# Patient Record
Sex: Female | Born: 1955 | Race: Black or African American | Hispanic: No | Marital: Married | Smoking: Former smoker
Health system: Southern US, Community
[De-identification: ages and names within clinical notes are randomized; demographics above are authoritative.]

## PROBLEM LIST (undated history)

## (undated) DIAGNOSIS — D649 Anemia, unspecified: Secondary | ICD-10-CM

## (undated) DIAGNOSIS — K219 Gastro-esophageal reflux disease without esophagitis: Secondary | ICD-10-CM

## (undated) DIAGNOSIS — Z9889 Other specified postprocedural states: Secondary | ICD-10-CM

## (undated) DIAGNOSIS — Z96659 Presence of unspecified artificial knee joint: Secondary | ICD-10-CM

## (undated) DIAGNOSIS — I10 Essential (primary) hypertension: Secondary | ICD-10-CM

## (undated) DIAGNOSIS — J189 Pneumonia, unspecified organism: Secondary | ICD-10-CM

## (undated) DIAGNOSIS — M549 Dorsalgia, unspecified: Secondary | ICD-10-CM

## (undated) DIAGNOSIS — N289 Disorder of kidney and ureter, unspecified: Secondary | ICD-10-CM

## (undated) DIAGNOSIS — H409 Unspecified glaucoma: Secondary | ICD-10-CM

## (undated) DIAGNOSIS — T7840XA Allergy, unspecified, initial encounter: Secondary | ICD-10-CM

## (undated) DIAGNOSIS — I639 Cerebral infarction, unspecified: Secondary | ICD-10-CM

## (undated) DIAGNOSIS — E785 Hyperlipidemia, unspecified: Secondary | ICD-10-CM

## (undated) DIAGNOSIS — J40 Bronchitis, not specified as acute or chronic: Secondary | ICD-10-CM

## (undated) DIAGNOSIS — IMO0002 Reserved for concepts with insufficient information to code with codable children: Secondary | ICD-10-CM

## (undated) DIAGNOSIS — F329 Major depressive disorder, single episode, unspecified: Secondary | ICD-10-CM

## (undated) DIAGNOSIS — F1410 Cocaine abuse, uncomplicated: Secondary | ICD-10-CM

## (undated) DIAGNOSIS — F32A Depression, unspecified: Secondary | ICD-10-CM

## (undated) DIAGNOSIS — M1990 Unspecified osteoarthritis, unspecified site: Secondary | ICD-10-CM

## (undated) HISTORY — DX: Reserved for concepts with insufficient information to code with codable children: IMO0002

## (undated) HISTORY — PX: ABDOMINAL HYSTERECTOMY: SHX81

## (undated) HISTORY — PX: COLONOSCOPY: SHX174

## (undated) HISTORY — DX: Cerebral infarction, unspecified: I63.9

## (undated) HISTORY — DX: Hyperlipidemia, unspecified: E78.5

## (undated) HISTORY — PX: CHOLECYSTECTOMY: SHX55

## (undated) HISTORY — PX: SPINE SURGERY: SHX786

## (undated) HISTORY — DX: Essential (primary) hypertension: I10

## (undated) HISTORY — DX: Unspecified glaucoma: H40.9

## (undated) HISTORY — DX: Allergy, unspecified, initial encounter: T78.40XA

## (undated) HISTORY — PX: SHOULDER SURGERY: SHX246

## (undated) HISTORY — PX: LUMBAR FUSION: SHX111

## (undated) HISTORY — PX: APPENDECTOMY: SHX54

## (undated) HISTORY — PX: KNEE ARTHROSCOPY: SHX127

## (undated) HISTORY — DX: Dorsalgia, unspecified: M54.9

## (undated) HISTORY — DX: Cocaine abuse, uncomplicated: F14.10

---

## 2002-08-05 HISTORY — PX: OTHER SURGICAL HISTORY: SHX169

## 2002-08-05 HISTORY — PX: JOINT REPLACEMENT: SHX530

## 2004-08-05 HISTORY — PX: BACK SURGERY: SHX140

## 2005-08-05 DIAGNOSIS — Z96659 Presence of unspecified artificial knee joint: Secondary | ICD-10-CM

## 2005-08-05 HISTORY — DX: Presence of unspecified artificial knee joint: Z96.659

## 2011-03-15 ENCOUNTER — Ambulatory Visit: Payer: Self-pay | Admitting: Family Medicine

## 2011-04-23 ENCOUNTER — Ambulatory Visit: Payer: Self-pay | Admitting: Family Medicine

## 2011-05-01 ENCOUNTER — Ambulatory Visit (INDEPENDENT_AMBULATORY_CARE_PROVIDER_SITE_OTHER): Payer: Medicare Other | Admitting: Family Medicine

## 2011-05-01 ENCOUNTER — Encounter: Payer: Self-pay | Admitting: Family Medicine

## 2011-05-01 VITALS — BP 172/100 | HR 83 | Resp 16 | Ht 67.0 in | Wt 226.0 lb

## 2011-05-01 DIAGNOSIS — E785 Hyperlipidemia, unspecified: Secondary | ICD-10-CM

## 2011-05-01 DIAGNOSIS — Z8673 Personal history of transient ischemic attack (TIA), and cerebral infarction without residual deficits: Secondary | ICD-10-CM

## 2011-05-01 DIAGNOSIS — Z720 Tobacco use: Secondary | ICD-10-CM

## 2011-05-01 DIAGNOSIS — F17200 Nicotine dependence, unspecified, uncomplicated: Secondary | ICD-10-CM

## 2011-05-01 DIAGNOSIS — G8929 Other chronic pain: Secondary | ICD-10-CM

## 2011-05-01 DIAGNOSIS — I10 Essential (primary) hypertension: Secondary | ICD-10-CM | POA: Insufficient documentation

## 2011-05-01 DIAGNOSIS — M549 Dorsalgia, unspecified: Secondary | ICD-10-CM

## 2011-05-01 MED ORDER — HYDROCHLOROTHIAZIDE 25 MG PO TABS: 25.0000 mg | ORAL_TABLET | Freq: Every day | ORAL | Status: DC
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
## 2011-05-01 NOTE — Patient Instructions (Signed)
Continue your Naprosyn For your blood pressure -start the HCTZ daily Please get your blood work- at least 48 hours before your next visit F/U in 3 weeks for blood pressure  Continue to work on your smoking.

## 2011-05-02 ENCOUNTER — Encounter: Payer: Self-pay | Admitting: Family Medicine

## 2011-05-02 DIAGNOSIS — Z8673 Personal history of transient ischemic attack (TIA), and cerebral infarction without residual deficits: Secondary | ICD-10-CM | POA: Insufficient documentation

## 2011-05-02 DIAGNOSIS — Z720 Tobacco use: Secondary | ICD-10-CM | POA: Insufficient documentation

## 2011-05-02 DIAGNOSIS — M549 Dorsalgia, unspecified: Secondary | ICD-10-CM | POA: Insufficient documentation

## 2011-05-02 DIAGNOSIS — G8929 Other chronic pain: Secondary | ICD-10-CM | POA: Insufficient documentation

## 2011-05-02 NOTE — Assessment & Plan Note (Signed)
Check FLP 

## 2011-05-02 NOTE — Assessment & Plan Note (Signed)
Obtain medical records. I am not sure she is not on a blood thinner such as plavix or aggrenox. She is also not on risk factor lower meds such as statin

## 2011-05-02 NOTE — Assessment & Plan Note (Signed)
Start HCTZ today, may need more than 1 med, obtain labs, recheck BP 3 weeks

## 2011-05-02 NOTE — Assessment & Plan Note (Signed)
In process of quitting, will continue to encourage

## 2011-05-02 NOTE — Assessment & Plan Note (Signed)
Maintained on NSAIDS at this time

## 2011-05-22 ENCOUNTER — Encounter: Payer: Medicare Other | Admitting: Family Medicine

## 2011-06-08 ENCOUNTER — Encounter (HOSPITAL_COMMUNITY): Payer: Self-pay

## 2011-06-08 ENCOUNTER — Emergency Department (HOSPITAL_COMMUNITY): Payer: Medicare Other

## 2011-06-08 ENCOUNTER — Emergency Department (HOSPITAL_COMMUNITY)
Admission: EM | Admit: 2011-06-08 | Discharge: 2011-06-08 | Disposition: A | Discharge status: Home / Self Care | Payer: Medicare Other | Attending: Emergency Medicine | Admitting: Emergency Medicine

## 2011-06-08 DIAGNOSIS — F17200 Nicotine dependence, unspecified, uncomplicated: Secondary | ICD-10-CM | POA: Insufficient documentation

## 2011-06-08 DIAGNOSIS — Z8673 Personal history of transient ischemic attack (TIA), and cerebral infarction without residual deficits: Secondary | ICD-10-CM | POA: Insufficient documentation

## 2011-06-08 DIAGNOSIS — M545 Low back pain, unspecified: Secondary | ICD-10-CM | POA: Insufficient documentation

## 2011-06-08 DIAGNOSIS — I10 Essential (primary) hypertension: Secondary | ICD-10-CM | POA: Insufficient documentation

## 2011-06-08 DIAGNOSIS — Y92410 Unspecified street and highway as the place of occurrence of the external cause: Secondary | ICD-10-CM | POA: Insufficient documentation

## 2011-06-08 DIAGNOSIS — S335XXA Sprain of ligaments of lumbar spine, initial encounter: Secondary | ICD-10-CM | POA: Insufficient documentation

## 2011-06-08 DIAGNOSIS — S39012A Strain of muscle, fascia and tendon of lower back, initial encounter: Secondary | ICD-10-CM

## 2011-06-08 MED ORDER — CYCLOBENZAPRINE HCL 10 MG PO TABS: ORAL_TABLET | ORAL | Status: DC

## 2011-06-08 MED ORDER — IBUPROFEN 800 MG PO TABS
800.0000 mg | ORAL_TABLET | Freq: Once | ORAL | Status: AC
  Administered 2011-06-08: 800 mg via ORAL
  Filled 2011-06-08: qty 1

## 2011-06-08 MED ORDER — CYCLOBENZAPRINE HCL 10 MG PO TABS
10.0000 mg | ORAL_TABLET | Freq: Once | ORAL | Status: AC
  Administered 2011-06-08: 10 mg via ORAL
  Filled 2011-06-08: qty 1
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
## 2011-06-08 NOTE — ED Provider Notes (Signed)
Medical screening examination/treatment/procedure(s) were performed by non-physician practitioner and as supervising physician I was immediately available for consultation/collaboration.   Dayton Bailiff, MD 06/08/11 2217
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
## 2011-06-18 ENCOUNTER — Encounter: Payer: Self-pay | Admitting: Family Medicine

## 2011-06-19 ENCOUNTER — Encounter: Payer: Self-pay | Admitting: Family Medicine

## 2011-06-20 ENCOUNTER — Ambulatory Visit (INDEPENDENT_AMBULATORY_CARE_PROVIDER_SITE_OTHER): Payer: Medicare Other | Admitting: Family Medicine

## 2011-06-20 ENCOUNTER — Encounter: Payer: Self-pay | Admitting: Family Medicine

## 2011-06-20 DIAGNOSIS — M549 Dorsalgia, unspecified: Secondary | ICD-10-CM

## 2011-06-20 DIAGNOSIS — F17200 Nicotine dependence, unspecified, uncomplicated: Secondary | ICD-10-CM

## 2011-06-20 DIAGNOSIS — Z720 Tobacco use: Secondary | ICD-10-CM

## 2011-06-20 DIAGNOSIS — I10 Essential (primary) hypertension: Secondary | ICD-10-CM

## 2011-06-20 DIAGNOSIS — Z23 Encounter for immunization: Secondary | ICD-10-CM

## 2011-06-20 DIAGNOSIS — G8929 Other chronic pain: Secondary | ICD-10-CM

## 2011-06-20 DIAGNOSIS — E785 Hyperlipidemia, unspecified: Secondary | ICD-10-CM

## 2011-06-20 MED ORDER — IBUPROFEN 800 MG PO TABS: 800.0000 mg | ORAL_TABLET | Freq: Three times a day (TID) | ORAL | Status: AC | PRN

## 2011-06-20 MED ORDER — TRIAMTERENE-HCTZ 37.5-25 MG PO TABS: 1.0000 | ORAL_TABLET | Freq: Every day | ORAL | Status: DC

## 2011-06-20 MED ORDER — CYCLOBENZAPRINE HCL 10 MG PO TABS: 10.0000 mg | ORAL_TABLET | Freq: Three times a day (TID) | ORAL | Status: DC | PRN
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
## 2011-06-20 NOTE — Assessment & Plan Note (Signed)
Uncontrolled, change to maxzide

## 2011-06-20 NOTE — Assessment & Plan Note (Signed)
Hyperlipidemia:Low fat diet discussed and encouraged.  Updated labs today 
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
## 2011-06-20 NOTE — Assessment & Plan Note (Signed)
counselled to quit , quit date is set for 12/25, current is 1 pack lasts 2 weeks

## 2011-06-20 NOTE — Assessment & Plan Note (Addendum)
Uncontrolled, and increased back pain, toradol in office today, anti inflammatories and muscle relaxant

## 2011-06-21 DIAGNOSIS — Z23 Encounter for immunization: Secondary | ICD-10-CM

## 2011-06-21 LAB — LIPID PANEL
HDL: 42 mg/dL (ref >39)
LDL Cholesterol: 226 mg/dL — ABNORMAL HIGH (ref 0–99)

## 2011-06-21 LAB — COMPREHENSIVE METABOLIC PANEL
ALT: 14 U/L (ref 0–35)
AST: 23 U/L (ref 0–37)
Alkaline Phosphatase: 91 U/L (ref 39–117)
Creat: 1.2 mg/dL — ABNORMAL HIGH (ref 0.50–1.10)
Sodium: 140 mEq/L (ref 135–145)
Total Bilirubin: 0.5 mg/dL (ref 0.3–1.2)
Total Protein: 7.1 g/dL (ref 6.0–8.3)

## 2011-06-21 LAB — CBC
HCT: 38.8 % (ref 36.0–46.0)
MCH: 27 pg (ref 26.0–34.0)
MCV: 83.3 fL (ref 78.0–100.0)
RDW: 14 % (ref 11.5–15.5)
WBC: 6.3 10*3/uL (ref 4.0–10.5)

## 2011-06-21 MED ORDER — KETOROLAC TROMETHAMINE 60 MG/2ML IM SOLN
60.0000 mg | Freq: Once | INTRAMUSCULAR | Status: AC
  Administered 2011-06-21: 60 mg via INTRAMUSCULAR

## 2011-06-21 NOTE — Progress Notes (Signed)
Addended by: Abner Greenspan on: 06/21/2011 01:32 PM   Modules accepted: Orders

## 2011-06-24 ENCOUNTER — Other Ambulatory Visit: Payer: Self-pay | Admitting: Family Medicine

## 2011-06-26 ENCOUNTER — Other Ambulatory Visit: Payer: Self-pay

## 2011-06-26 MED ORDER — PRAVASTATIN SODIUM 40 MG PO TABS: 40.0000 mg | ORAL_TABLET | Freq: Every evening | ORAL | Status: DC

## 2011-07-23 ENCOUNTER — Ambulatory Visit (HOSPITAL_COMMUNITY)
Admission: RE | Admit: 2011-07-23 | Discharge: 2011-07-23 | Disposition: A | Discharge status: Home / Self Care | Payer: Medicare Other | Source: Ambulatory Visit | Attending: Sports Medicine | Admitting: Sports Medicine

## 2011-07-23 ENCOUNTER — Encounter (HOSPITAL_COMMUNITY): Payer: Self-pay

## 2011-07-23 DIAGNOSIS — M6281 Muscle weakness (generalized): Secondary | ICD-10-CM | POA: Insufficient documentation

## 2011-07-23 DIAGNOSIS — IMO0001 Reserved for inherently not codable concepts without codable children: Secondary | ICD-10-CM | POA: Insufficient documentation

## 2011-07-23 DIAGNOSIS — I10 Essential (primary) hypertension: Secondary | ICD-10-CM | POA: Insufficient documentation

## 2011-07-23 DIAGNOSIS — M545 Low back pain, unspecified: Secondary | ICD-10-CM | POA: Insufficient documentation

## 2011-07-23 HISTORY — DX: Other specified postprocedural states: Z98.890

## 2011-07-23 HISTORY — DX: Presence of unspecified artificial knee joint: Z96.659
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
## 2011-07-25 ENCOUNTER — Ambulatory Visit: Payer: Medicare Other | Admitting: Family Medicine

## 2011-07-25 ENCOUNTER — Telehealth: Payer: Self-pay | Admitting: Family Medicine

## 2011-07-25 MED ORDER — CYCLOBENZAPRINE HCL 10 MG PO TABS: 10.0000 mg | ORAL_TABLET | Freq: Three times a day (TID) | ORAL | Status: DC | PRN

## 2011-07-25 NOTE — Telephone Encounter (Signed)
Sent in

## 2011-07-26 ENCOUNTER — Ambulatory Visit (HOSPITAL_COMMUNITY)
Admission: RE | Admit: 2011-07-26 | Discharge: 2011-07-26 | Disposition: A | Discharge status: Home / Self Care | Payer: Medicare Other | Source: Ambulatory Visit
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
## 2011-08-01 ENCOUNTER — Ambulatory Visit (HOSPITAL_COMMUNITY): Payer: Medicare Other

## 2011-08-01 ENCOUNTER — Telehealth (HOSPITAL_COMMUNITY): Payer: Self-pay

## 2011-08-08 ENCOUNTER — Ambulatory Visit (HOSPITAL_COMMUNITY): Payer: Medicare Other

## 2011-08-13 ENCOUNTER — Encounter: Payer: Self-pay | Admitting: Family Medicine

## 2011-08-13 ENCOUNTER — Ambulatory Visit: Payer: Medicare Other | Admitting: Family Medicine

## 2011-08-13 ENCOUNTER — Inpatient Hospital Stay (HOSPITAL_COMMUNITY): Admission: RE | Admit: 2011-08-13 | Payer: Medicare Other | Source: Ambulatory Visit

## 2011-08-15 ENCOUNTER — Ambulatory Visit (HOSPITAL_COMMUNITY): Payer: Medicare Other

## 2011-08-27 ENCOUNTER — Ambulatory Visit (INDEPENDENT_AMBULATORY_CARE_PROVIDER_SITE_OTHER): Payer: Medicare Other | Admitting: Family Medicine

## 2011-08-27 ENCOUNTER — Encounter: Payer: Self-pay | Admitting: Family Medicine

## 2011-08-27 VITALS — BP 160/98 | HR 87 | Temp 99.0°F | Resp 16 | Ht 67.0 in | Wt 235.0 lb

## 2011-08-27 DIAGNOSIS — I10 Essential (primary) hypertension: Secondary | ICD-10-CM

## 2011-08-27 DIAGNOSIS — G8929 Other chronic pain: Secondary | ICD-10-CM

## 2011-08-27 DIAGNOSIS — E785 Hyperlipidemia, unspecified: Secondary | ICD-10-CM

## 2011-08-27 DIAGNOSIS — Z720 Tobacco use: Secondary | ICD-10-CM

## 2011-08-27 DIAGNOSIS — J209 Acute bronchitis, unspecified: Secondary | ICD-10-CM

## 2011-08-27 DIAGNOSIS — F17200 Nicotine dependence, unspecified, uncomplicated: Secondary | ICD-10-CM

## 2011-08-27 DIAGNOSIS — M549 Dorsalgia, unspecified: Secondary | ICD-10-CM

## 2011-08-27 DIAGNOSIS — J40 Bronchitis, not specified as acute or chronic: Secondary | ICD-10-CM

## 2011-08-27 MED ORDER — PREDNISONE 20 MG PO TABS: ORAL_TABLET | ORAL | Status: DC

## 2011-08-27 MED ORDER — HYDROCODONE-ACETAMINOPHEN 5-500 MG PO TABS: 1.0000 | ORAL_TABLET | Freq: Two times a day (BID) | ORAL | Status: DC | PRN

## 2011-08-27 MED ORDER — CEFTRIAXONE SODIUM 1 G IJ SOLR
1.0000 g | Freq: Once | INTRAMUSCULAR | Status: AC
  Administered 2011-08-27: 1 g via INTRAMUSCULAR

## 2011-08-27 MED ORDER — METHYLPREDNISOLONE SODIUM SUCC 125 MG IJ SOLR
60.0000 mg | Freq: Once | INTRAMUSCULAR | Status: AC
  Administered 2011-08-27: 60 mg via INTRAMUSCULAR

## 2011-08-27 MED ORDER — DOXYCYCLINE HYCLATE 100 MG PO TABS: 100.0000 mg | ORAL_TABLET | Freq: Two times a day (BID) | ORAL | Status: AC

## 2011-08-27 MED ORDER — AMLODIPINE BESYLATE 10 MG PO TABS: 10.0000 mg | ORAL_TABLET | Freq: Every day | ORAL | Status: DC

## 2011-08-27 MED ORDER — ATORVASTATIN CALCIUM 40 MG PO TABS: 40.0000 mg | ORAL_TABLET | Freq: Every day | ORAL | Status: DC
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
## 2011-08-27 NOTE — Patient Instructions (Signed)
Year being treated for bronchitis and sinusitis. Take the antibiotics as prescribed Take the steroids for both her lungs and her back pain Take Mucinex for congestion A week after he you are feeling better start the amlodipine in addition to the Maxzide for your blood pressure. Start the Lipitor for her cholesterol I encourage you to quit smoking Followup in 2 months

## 2011-08-28 ENCOUNTER — Encounter: Payer: Self-pay | Admitting: Family Medicine

## 2011-08-28 DIAGNOSIS — J209 Acute bronchitis, unspecified: Secondary | ICD-10-CM | POA: Insufficient documentation

## 2011-08-28 NOTE — Assessment & Plan Note (Addendum)
F/u with surgeon, history of chronic back problems,

## 2011-08-28 NOTE — Assessment & Plan Note (Addendum)
Blood pressure, not at goal, add norvasc to regimen,

## 2011-08-28 NOTE — Assessment & Plan Note (Signed)
Counseled on cessation 
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
## 2011-08-28 NOTE — Assessment & Plan Note (Signed)
Very high LDL, start Lipitor, pt never received Pravastatin

## 2011-08-28 NOTE — Assessment & Plan Note (Signed)
Antibiotics, steroid burst

## 2011-10-01 ENCOUNTER — Ambulatory Visit (HOSPITAL_COMMUNITY)
Admission: RE | Admit: 2011-10-01 | Discharge: 2011-10-01 | Disposition: A | Discharge status: Home / Self Care | Payer: Medicare Other | Source: Ambulatory Visit | Attending: Sports Medicine | Admitting: Sports Medicine

## 2011-10-01 DIAGNOSIS — M545 Low back pain, unspecified: Secondary | ICD-10-CM | POA: Insufficient documentation

## 2011-10-01 DIAGNOSIS — I10 Essential (primary) hypertension: Secondary | ICD-10-CM | POA: Insufficient documentation

## 2011-10-01 DIAGNOSIS — IMO0001 Reserved for inherently not codable concepts without codable children: Secondary | ICD-10-CM | POA: Insufficient documentation

## 2011-10-01 DIAGNOSIS — M6281 Muscle weakness (generalized): Secondary | ICD-10-CM | POA: Insufficient documentation
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
## 2011-10-03 ENCOUNTER — Ambulatory Visit (HOSPITAL_COMMUNITY)
Admission: RE | Admit: 2011-10-03 | Discharge: 2011-10-03 | Disposition: A | Discharge status: Home / Self Care | Payer: Medicare Other | Source: Ambulatory Visit | Attending: Family Medicine | Admitting: Family Medicine
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
## 2011-10-08 ENCOUNTER — Telehealth: Payer: Self-pay | Admitting: Family Medicine

## 2011-10-08 ENCOUNTER — Ambulatory Visit (HOSPITAL_COMMUNITY)
Admission: RE | Admit: 2011-10-08 | Discharge: 2011-10-08 | Disposition: A | Discharge status: Home / Self Care | Payer: Medicare Other | Source: Ambulatory Visit | Attending: Sports Medicine | Admitting: Sports Medicine

## 2011-10-08 DIAGNOSIS — M6281 Muscle weakness (generalized): Secondary | ICD-10-CM | POA: Insufficient documentation

## 2011-10-08 DIAGNOSIS — IMO0001 Reserved for inherently not codable concepts without codable children: Secondary | ICD-10-CM | POA: Insufficient documentation

## 2011-10-08 DIAGNOSIS — M545 Low back pain, unspecified: Secondary | ICD-10-CM | POA: Insufficient documentation

## 2011-10-08 DIAGNOSIS — I10 Essential (primary) hypertension: Secondary | ICD-10-CM | POA: Insufficient documentation

## 2011-10-08 MED ORDER — CYCLOBENZAPRINE HCL 10 MG PO TABS: 10.0000 mg | ORAL_TABLET | Freq: Three times a day (TID) | ORAL | Status: DC | PRN

## 2011-10-08 MED ORDER — HYDROCODONE-ACETAMINOPHEN 5-500 MG PO TABS: 1.0000 | ORAL_TABLET | Freq: Two times a day (BID) | ORAL | Status: DC | PRN

## 2011-10-08 NOTE — Telephone Encounter (Signed)
Wants to come pick up rx's. Ok to refill?

## 2011-10-08 NOTE — Telephone Encounter (Signed)
Medication refilled, pt needs to keep appt in March, please let her know
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
## 2011-10-09 NOTE — Telephone Encounter (Signed)
Pt aware they are ready for pickup

## 2011-10-10 ENCOUNTER — Ambulatory Visit (HOSPITAL_COMMUNITY)
Admission: RE | Admit: 2011-10-10 | Discharge: 2011-10-10 | Disposition: A | Discharge status: Home / Self Care | Payer: Medicare Other | Source: Ambulatory Visit | Attending: Family Medicine | Admitting: Family Medicine
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
## 2011-10-11 ENCOUNTER — Telehealth: Payer: Self-pay

## 2011-10-11 NOTE — Telephone Encounter (Signed)
I called and left a voicemail on home number, cell was not taking messages I was not aware that Dr. Farris Has was dispensing medication for pain. I will need to discuss this with her

## 2011-10-11 NOTE — Telephone Encounter (Signed)
[facility] called and said that she bought a vicodin and said that she just got a rx for percocet from Dr Farris Has on March 6 for Percocet 10/325 #30 tabs. She also received some from him in Feb. States that both Dr's know about each other and what is being prescribed. Wanted to let you know.

## 2011-10-14 NOTE — Telephone Encounter (Signed)
I spoke with pt mother who is listed as contact, both of Olita's numbers are disconnected. She will have her call me

## 2011-10-15 ENCOUNTER — Ambulatory Visit (HOSPITAL_COMMUNITY): Payer: Medicare Other

## 2011-10-15 ENCOUNTER — Telehealth: Payer: Self-pay | Admitting: Family Medicine

## 2011-10-15 ENCOUNTER — Telehealth (HOSPITAL_COMMUNITY): Payer: Self-pay

## 2011-10-15 NOTE — Telephone Encounter (Signed)
I spoke with patient regarding her pain medication. She did not know her surgeon was called and narcotic as well as prednisone for her back. At this time she will only use the Percocet that was prescribed by Dr. Farris Has. She can take the Vicodin after that runs out if he does not give her any further medication.

## 2011-10-17 ENCOUNTER — Ambulatory Visit (HOSPITAL_COMMUNITY): Payer: Medicare Other | Admitting: Physical Therapy

## 2011-10-23 ENCOUNTER — Ambulatory Visit (HOSPITAL_COMMUNITY)
Admission: RE | Admit: 2011-10-23 | Discharge: 2011-10-23 | Disposition: A | Discharge status: Home / Self Care | Payer: Medicare Other | Source: Ambulatory Visit | Attending: Family Medicine | Admitting: Family Medicine
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
## 2011-10-25 ENCOUNTER — Ambulatory Visit: Payer: Medicare Other | Admitting: Family Medicine

## 2011-10-25 ENCOUNTER — Encounter: Payer: Self-pay | Admitting: Family Medicine

## 2011-10-30 ENCOUNTER — Ambulatory Visit (HOSPITAL_COMMUNITY)
Admission: RE | Admit: 2011-10-30 | Discharge: 2011-10-30 | Disposition: A | Discharge status: Home / Self Care | Payer: Medicare Other | Source: Ambulatory Visit | Attending: Family Medicine | Admitting: Family Medicine
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
## 2011-11-01 ENCOUNTER — Telehealth (HOSPITAL_COMMUNITY): Payer: Self-pay

## 2011-11-01 ENCOUNTER — Ambulatory Visit (HOSPITAL_COMMUNITY)
Admission: RE | Admit: 2011-11-01 | Payer: Medicare Other | Source: Ambulatory Visit | Attending: Family Medicine | Admitting: Family Medicine

## 2011-11-05 ENCOUNTER — Ambulatory Visit (HOSPITAL_COMMUNITY)
Admission: RE | Admit: 2011-11-05 | Discharge: 2011-11-05 | Disposition: A | Discharge status: Home / Self Care | Payer: Medicare Other | Source: Ambulatory Visit | Attending: Sports Medicine | Admitting: Sports Medicine

## 2011-11-05 DIAGNOSIS — M545 Low back pain, unspecified: Secondary | ICD-10-CM | POA: Insufficient documentation

## 2011-11-05 DIAGNOSIS — M6281 Muscle weakness (generalized): Secondary | ICD-10-CM | POA: Insufficient documentation

## 2011-11-05 DIAGNOSIS — IMO0001 Reserved for inherently not codable concepts without codable children: Secondary | ICD-10-CM | POA: Insufficient documentation

## 2011-11-05 DIAGNOSIS — I10 Essential (primary) hypertension: Secondary | ICD-10-CM | POA: Insufficient documentation
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
## 2011-11-07 ENCOUNTER — Encounter: Payer: Self-pay | Admitting: Family Medicine

## 2011-11-07 ENCOUNTER — Ambulatory Visit (INDEPENDENT_AMBULATORY_CARE_PROVIDER_SITE_OTHER): Payer: Medicare Other | Admitting: Family Medicine

## 2011-11-07 VITALS — BP 180/88 | HR 102 | Resp 15

## 2011-11-07 DIAGNOSIS — G8929 Other chronic pain: Secondary | ICD-10-CM

## 2011-11-07 DIAGNOSIS — Z720 Tobacco use: Secondary | ICD-10-CM

## 2011-11-07 DIAGNOSIS — I10 Essential (primary) hypertension: Secondary | ICD-10-CM

## 2011-11-07 DIAGNOSIS — E669 Obesity, unspecified: Secondary | ICD-10-CM

## 2011-11-07 DIAGNOSIS — M549 Dorsalgia, unspecified: Secondary | ICD-10-CM

## 2011-11-07 DIAGNOSIS — F17200 Nicotine dependence, unspecified, uncomplicated: Secondary | ICD-10-CM

## 2011-11-07 DIAGNOSIS — E785 Hyperlipidemia, unspecified: Secondary | ICD-10-CM

## 2011-11-07 MED ORDER — GABAPENTIN 300 MG PO CAPS: 300.0000 mg | ORAL_CAPSULE | Freq: Three times a day (TID) | ORAL | Status: DC

## 2011-11-07 MED ORDER — HYDROCODONE-ACETAMINOPHEN 5-500 MG PO TABS: 1.0000 | ORAL_TABLET | Freq: Two times a day (BID) | ORAL | Status: DC | PRN

## 2011-11-07 MED ORDER — CYCLOBENZAPRINE HCL 10 MG PO TABS: 10.0000 mg | ORAL_TABLET | Freq: Three times a day (TID) | ORAL | Status: DC | PRN

## 2011-11-07 MED ORDER — ATORVASTATIN CALCIUM 40 MG PO TABS: 40.0000 mg | ORAL_TABLET | Freq: Every day | ORAL | Status: DC

## 2011-11-07 MED ORDER — TRIAMTERENE-HCTZ 37.5-25 MG PO TABS: 1.0000 | ORAL_TABLET | Freq: Every day | ORAL | Status: DC

## 2011-11-07 MED ORDER — AMLODIPINE BESYLATE 10 MG PO TABS: 10.0000 mg | ORAL_TABLET | Freq: Every day | ORAL | Status: DC

## 2011-11-07 NOTE — Patient Instructions (Addendum)
F/U in 3 month We will have labs done at that time  Continue your current meds Continue to work on the smoking Call for your pain meds before you run out Cholesterol Control Diet Cholesterol levels in your body are determined significantly by your diet. Cholesterol levels may also be related to heart disease. The following material helps to explain this relationship and discusses what you can do to help keep your heart healthy. Not all cholesterol is bad. Low-density lipoprotein (LDL) cholesterol is the "bad" cholesterol. It may cause fatty deposits to build up inside your arteries. High-density lipoprotein (HDL) cholesterol is "good." It helps to remove the "bad" LDL cholesterol from your blood. Cholesterol is a very important risk factor for heart disease. Other risk factors are high blood pressure, smoking, stress, heredity, and weight. The heart muscle gets its supply of blood through the coronary arteries. If your LDL cholesterol is high and your HDL cholesterol is low, you are at risk for having fatty deposits build up in your coronary arteries. This leaves less room through which blood can flow. Without sufficient blood and oxygen, the heart muscle cannot function properly and you may feel chest pains (angina pectoris). When a coronary artery closes up entirely, a part of the heart muscle may die, causing a heart attack (myocardial infarction). CHECKING CHOLESTEROL When your caregiver sends your blood to a lab to be analyzed for cholesterol, a complete lipid (fat) profile may be done. With this test, the total amount of cholesterol and levels of LDL and HDL are determined. Triglycerides are a type of fat that circulates in the blood and can also be used to determine heart disease risk. The list below describes what the numbers should be: Test: Total Cholesterol.  Less than 200 mg/dl.  Test: LDL "bad cholesterol."  Less than 100 mg/dl.   Less than 70 mg/dl if you are at very high risk of a  heart attack or sudden cardiac death.  Test: HDL "good cholesterol."  Greater than 50 mg/dl for women.   Greater than 40 mg/dl for men.  Test: Triglycerides.  Less than 150 mg/dl.  CONTROLLING CHOLESTEROL WITH DIET Although exercise and lifestyle factors are important, your diet is key. That is because certain foods are known to raise cholesterol and others to lower it. The goal is to balance foods for their effect on cholesterol and more importantly, to replace saturated and trans fat with other types of fat, such as monounsaturated fat, polyunsaturated fat, and omega-3 fatty acids. On average, a person should consume no more than 15 to 17 g of saturated fat daily. Saturated and trans fats are considered "bad" fats, and they will raise LDL cholesterol. Saturated fats are primarily found in animal products such as meats, butter, and cream. However, that does not mean you need to sacrifice all your favorite foods. Today, there are good tasting, low-fat, low-cholesterol substitutes for most of the things you like to eat. Choose low-fat or nonfat alternatives. Choose round or loin cuts of red meat, since these types of cuts are lowest in fat and cholesterol. Chicken (without the skin), fish, veal, and ground [place] breast are excellent choices. Eliminate fatty meats, such as hot dogs and salami. Even shellfish have little or no saturated fat. Have a 3 oz (85 g) portion when you eat lean meat, poultry, or fish. Trans fats are also called "partially hydrogenated oils." They are oils that have been scientifically manipulated so that they are solid at room temperature resulting in a  longer shelf life and improved taste and texture of foods in which they are added. Trans fats are found in stick margarine, some tub margarines, cookies, crackers, and baked goods.   When baking and cooking, oils are an excellent substitute for butter. The monounsaturated oils are especially beneficial since it is believed they  lower LDL and raise HDL. The oils you should avoid entirely are saturated tropical oils, such as coconut and palm.   Remember to eat liberally from food groups that are naturally free of saturated and trans fat, including fish, fruit, vegetables, beans, grains (barley, rice, couscous, bulgur wheat), and pasta (without cream sauces).   IDENTIFYING FOODS THAT LOWER CHOLESTEROL   Soluble fiber may lower your cholesterol. This type of fiber is found in fruits such as apples, vegetables such as broccoli, potatoes, and carrots, legumes such as beans, peas, and lentils, and grains such as barley. Foods fortified with plant sterols (phytosterol) may also lower cholesterol. You should eat at least 2 g per day of these foods for a cholesterol lowering effect.   Read package labels to identify low-saturated fats, trans fats free, and low-fat foods at the supermarket. Select cheeses that have only 2 to 3 g saturated fat per ounce. Use a heart-healthy tub margarine that is free of trans fats or partially hydrogenated oil. When buying baked goods (cookies, crackers), avoid partially hydrogenated oils. Breads and muffins should be made from whole grains (whole-wheat or whole oat flour, instead of "flour" or "enriched flour"). Buy non-creamy canned soups with reduced salt and no added fats.   FOOD PREPARATION TECHNIQUES   Never deep-fry. If you must fry, either stir-fry, which uses very little fat, or use non-stick cooking sprays. When possible, broil, bake, or roast meats, and steam vegetables. Instead of dressing vegetables with butter or margarine, use lemon and herbs, applesauce and cinnamon (for squash and sweet potatoes), nonfat yogurt, salsa, and low-fat dressings for salads.   LOW-SATURATED FAT / LOW-FAT FOOD SUBSTITUTES Meats / Saturated Fat (g)  Avoid: Steak, marbled (3 oz/85 g) / 11 g   Choose: Steak, lean (3 oz/85 g) / 4 g   Avoid: Hamburger (3 oz/85 g) / 7 g   Choose: Hamburger, lean (3 oz/85 g) / 5 g     Avoid: Ham (3 oz/85 g) / 6 g   Choose: Ham, lean cut (3 oz/85 g) / 2.4 g   Avoid: Chicken, with skin, dark meat (3 oz/85 g) / 4 g   Choose: Chicken, skin removed, dark meat (3 oz/85 g) / 2 g   Avoid: Chicken, with skin, light meat (3 oz/85 g) / 2.5 g   Choose: Chicken, skin removed, light meat (3 oz/85 g) / 1 g  Dairy / Saturated Fat (g)  Avoid: Whole milk (1 cup) / 5 g   Choose: Low-fat milk, 2% (1 cup) / 3 g   Choose: Low-fat milk, 1% (1 cup) / 1.5 g   Choose: Skim milk (1 cup) / 0.3 g   Avoid: Hard cheese (1 oz/28 g) / 6 g   Choose: Skim milk cheese (1 oz/28 g) / 2 to 3 g   Avoid: Cottage cheese, 4% fat (1 cup) / 6.5 g   Choose: Low-fat cottage cheese, 1% fat (1 cup) / 1.5 g   Avoid: Ice cream (1 cup) / 9 g   Choose: Sherbet (1 cup) / 2.5 g   Choose: Nonfat frozen yogurt (1 cup) / 0.3 g   Choose: Frozen fruit bar /  trace   Avoid: Whipped cream (1 tbs) / 3.5 g   Choose: Nondairy whipped topping (1 tbs) / 1 g  Condiments / Saturated Fat (g)  Avoid: Mayonnaise (1 tbs) / 2 g   Choose: Low-fat mayonnaise (1 tbs) / 1 g   Avoid: Butter (1 tbs) / 7 g   Choose: Extra light margarine (1 tbs) / 1 g   Avoid: Coconut oil (1 tbs) / 11.8 g   Choose: Olive oil (1 tbs) / 1.8 g   Choose: Corn oil (1 tbs) / 1.7 g   Choose: Safflower oil (1 tbs) / 1.2 g   Choose: Sunflower oil (1 tbs) / 1.4 g   Choose: Soybean oil (1 tbs) / 2.4 g   Choose: Canola oil (1 tbs) / 1 g  Document Released: 07/22/2005 Document Revised: 07/11/2011 Document Reviewed: 01/10/2011 [facility] Patient Information 2012 [place], [place].

## 2011-11-07 NOTE — Progress Notes (Signed)
  Subjective:    Patient ID: Bryn Gulling, female    DOB: 1955/08/22, 56 y.o.   MRN: [id number]  HPI HTN- has been out of meds since January because she lost her insurance Back pain- persistent back pain, followed by Dr. Farris Has, they are sending her to a specialist for alternative treatments, she has decided to use pain meds that I am prescribing only. Surgeon did start her on neurontin but she has been out of this. Also using TENS unit and has PT   Review of Systems  GEN- denies fatigue, fever, weight loss,weakness, recent illness HEENT- denies eye drainage, change in vision, nasal discharge, CVS- denies chest pain, palpitations RESP- denies SOB, cough, wheeze ABD- denies N/V, change in stools, abd pain GU- denies dysuria, hematuria, dribbling, incontinence MSK- + joint pain, +muscle aches, injury Neuro- denies headache, dizziness, syncope, seizure activity      Objective:   Physical Exam GEN- NAD, alert and oriented x3, walks with cane Neck- Supple, no thryomegaly CVS- RRR, no murmur RESP-CTAB EXT- No edema Pulses- Radial, DP- 2+ Gait- antalgic       Assessment & Plan:

## 2011-11-07 NOTE — Assessment & Plan Note (Signed)
Pt put on pain contact for vicodin therapy, continue neurontin

## 2011-11-07 NOTE — Assessment & Plan Note (Signed)
Lipitor restarted, no labs as she has not been on any meds, will defer to next visit

## 2011-11-07 NOTE — Assessment & Plan Note (Signed)
Much improved, down to 1 cig a day.

## 2011-11-07 NOTE — Assessment & Plan Note (Signed)
BP meds refilled.  

## 2011-11-11 ENCOUNTER — Ambulatory Visit (HOSPITAL_COMMUNITY): Payer: Medicare Other | Admitting: Physical Therapy

## 2011-11-14 ENCOUNTER — Inpatient Hospital Stay (HOSPITAL_COMMUNITY): Admission: RE | Admit: 2011-11-14 | Payer: Medicare Other | Source: Ambulatory Visit

## 2011-11-14 ENCOUNTER — Telehealth (HOSPITAL_COMMUNITY): Payer: Self-pay

## 2011-11-14 ENCOUNTER — Encounter (HOSPITAL_COMMUNITY): Payer: Self-pay | Admitting: *Deleted

## 2011-11-14 ENCOUNTER — Emergency Department (HOSPITAL_COMMUNITY): Payer: Medicare Other

## 2011-11-14 ENCOUNTER — Emergency Department (HOSPITAL_COMMUNITY)
Admission: EM | Admit: 2011-11-14 | Discharge: 2011-11-14 | Disposition: A | Discharge status: Home / Self Care | Payer: Medicare Other | Attending: Emergency Medicine | Admitting: Emergency Medicine

## 2011-11-14 DIAGNOSIS — M545 Low back pain, unspecified: Secondary | ICD-10-CM | POA: Insufficient documentation

## 2011-11-14 DIAGNOSIS — IMO0002 Reserved for concepts with insufficient information to code with codable children: Secondary | ICD-10-CM | POA: Insufficient documentation

## 2011-11-14 DIAGNOSIS — R209 Unspecified disturbances of skin sensation: Secondary | ICD-10-CM | POA: Insufficient documentation

## 2011-11-14 DIAGNOSIS — M5410 Radiculopathy, site unspecified: Secondary | ICD-10-CM

## 2011-11-14 DIAGNOSIS — Z96659 Presence of unspecified artificial knee joint: Secondary | ICD-10-CM | POA: Insufficient documentation

## 2011-11-14 DIAGNOSIS — I10 Essential (primary) hypertension: Secondary | ICD-10-CM | POA: Insufficient documentation

## 2011-11-14 DIAGNOSIS — Z8673 Personal history of transient ischemic attack (TIA), and cerebral infarction without residual deficits: Secondary | ICD-10-CM | POA: Insufficient documentation

## 2011-11-14 DIAGNOSIS — M25559 Pain in unspecified hip: Secondary | ICD-10-CM | POA: Insufficient documentation

## 2011-11-14 DIAGNOSIS — F17200 Nicotine dependence, unspecified, uncomplicated: Secondary | ICD-10-CM | POA: Insufficient documentation

## 2011-11-14 MED ORDER — CYCLOBENZAPRINE HCL 10 MG PO TABS: 10.0000 mg | ORAL_TABLET | Freq: Two times a day (BID) | ORAL | Status: AC | PRN

## 2011-11-14 MED ORDER — OXYCODONE-ACETAMINOPHEN 5-325 MG PO TABS
2.0000 | ORAL_TABLET | Freq: Once | ORAL | Status: AC
  Administered 2011-11-14: 2 via ORAL
  Filled 2011-11-14: qty 2

## 2011-11-14 MED ORDER — HYDROMORPHONE HCL PF 2 MG/ML IJ SOLN
2.0000 mg | Freq: Once | INTRAMUSCULAR | Status: AC
  Administered 2011-11-14: 2 mg via INTRAMUSCULAR
  Filled 2011-11-14: qty 1

## 2011-11-14 MED ORDER — PREDNISONE 50 MG PO TABS: 50.0000 mg | ORAL_TABLET | Freq: Every day | ORAL | Status: AC

## 2011-11-14 MED ORDER — OXYCODONE-ACETAMINOPHEN 5-325 MG PO TABS: 2.0000 | ORAL_TABLET | ORAL | Status: AC | PRN

## 2011-11-14 MED ORDER — ONDANSETRON 8 MG PO TBDP
8.0000 mg | ORAL_TABLET | Freq: Once | ORAL | Status: AC
  Administered 2011-11-14: 8 mg via ORAL
  Filled 2011-11-14: qty 1

## 2011-11-14 NOTE — Discharge Instructions (Signed)
EKG and x-ray of lower back were normal. Medications for pain, inflammation, muscle spasm. Ice pack to back. Rest.   If Symptoms do not improve,  see your neurosurgeon in [place]
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
## 2011-11-14 NOTE — ED Notes (Signed)
Generalized pain

## 2011-11-14 NOTE — ED Notes (Signed)
Left in c/o spouse for transport home; instructions reviewed and f/u information provided.  Verbalizes understanding.  

## 2011-11-18 ENCOUNTER — Ambulatory Visit (HOSPITAL_COMMUNITY): Payer: Medicare Other | Admitting: *Deleted

## 2011-11-21 ENCOUNTER — Ambulatory Visit (HOSPITAL_COMMUNITY)
Admission: RE | Admit: 2011-11-21 | Discharge: 2011-11-21 | Payer: Medicare Other | Source: Ambulatory Visit | Attending: Physical Therapy | Admitting: Physical Therapy
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
## 2011-11-25 ENCOUNTER — Ambulatory Visit (HOSPITAL_COMMUNITY): Payer: Medicare Other | Admitting: *Deleted

## 2011-11-25 ENCOUNTER — Telehealth (HOSPITAL_COMMUNITY): Payer: Self-pay

## 2011-11-28 ENCOUNTER — Ambulatory Visit (HOSPITAL_COMMUNITY)
Admission: RE | Admit: 2011-11-28 | Discharge: 2011-11-28 | Disposition: A | Discharge status: Home / Self Care | Payer: Medicare Other | Source: Ambulatory Visit | Attending: Family Medicine | Admitting: Family Medicine
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
## 2011-12-02 ENCOUNTER — Ambulatory Visit (HOSPITAL_COMMUNITY): Payer: Medicare Other | Admitting: *Deleted

## 2011-12-02 ENCOUNTER — Telehealth (HOSPITAL_COMMUNITY): Payer: Self-pay

## 2011-12-11 ENCOUNTER — Ambulatory Visit (HOSPITAL_COMMUNITY)
Admission: RE | Admit: 2011-12-11 | Discharge: 2011-12-11 | Disposition: A | Discharge status: Home / Self Care | Payer: Medicare Other | Source: Ambulatory Visit | Attending: Sports Medicine | Admitting: Sports Medicine

## 2011-12-11 DIAGNOSIS — IMO0001 Reserved for inherently not codable concepts without codable children: Secondary | ICD-10-CM | POA: Insufficient documentation

## 2011-12-11 DIAGNOSIS — I10 Essential (primary) hypertension: Secondary | ICD-10-CM | POA: Insufficient documentation

## 2011-12-11 DIAGNOSIS — M545 Low back pain, unspecified: Secondary | ICD-10-CM | POA: Insufficient documentation

## 2011-12-11 DIAGNOSIS — M6281 Muscle weakness (generalized): Secondary | ICD-10-CM | POA: Insufficient documentation
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
## 2011-12-13 ENCOUNTER — Ambulatory Visit (HOSPITAL_COMMUNITY): Payer: Medicare Other

## 2011-12-13 ENCOUNTER — Other Ambulatory Visit: Payer: Self-pay

## 2011-12-13 MED ORDER — HYDROCODONE-ACETAMINOPHEN 5-500 MG PO TABS: 1.0000 | ORAL_TABLET | Freq: Two times a day (BID) | ORAL | Status: DC | PRN

## 2011-12-17 ENCOUNTER — Ambulatory Visit (HOSPITAL_COMMUNITY): Payer: Medicare Other | Admitting: Physical Therapy

## 2011-12-18 ENCOUNTER — Ambulatory Visit (HOSPITAL_COMMUNITY): Payer: Medicare Other | Admitting: *Deleted

## 2011-12-24 ENCOUNTER — Ambulatory Visit (HOSPITAL_COMMUNITY): Payer: Medicare Other | Admitting: *Deleted

## 2011-12-26 ENCOUNTER — Ambulatory Visit (HOSPITAL_COMMUNITY): Payer: Medicare Other | Admitting: *Deleted

## 2011-12-31 ENCOUNTER — Ambulatory Visit (HOSPITAL_COMMUNITY): Payer: Medicare Other | Admitting: *Deleted

## 2012-01-02 ENCOUNTER — Ambulatory Visit (HOSPITAL_COMMUNITY): Payer: Medicare Other

## 2012-02-11 ENCOUNTER — Ambulatory Visit: Payer: Medicare Other | Admitting: Family Medicine

## 2012-02-14 ENCOUNTER — Ambulatory Visit: Payer: Medicare Other | Admitting: Family Medicine

## 2012-02-17 ENCOUNTER — Encounter: Payer: Self-pay | Admitting: Family Medicine

## 2012-02-17 ENCOUNTER — Ambulatory Visit (INDEPENDENT_AMBULATORY_CARE_PROVIDER_SITE_OTHER): Payer: Medicare Other | Admitting: Family Medicine

## 2012-02-17 VITALS — BP 208/120 | HR 80 | Resp 18 | Ht 67.0 in | Wt 241.0 lb

## 2012-02-17 DIAGNOSIS — Z720 Tobacco use: Secondary | ICD-10-CM

## 2012-02-17 DIAGNOSIS — E669 Obesity, unspecified: Secondary | ICD-10-CM

## 2012-02-17 DIAGNOSIS — M549 Dorsalgia, unspecified: Secondary | ICD-10-CM

## 2012-02-17 DIAGNOSIS — I10 Essential (primary) hypertension: Secondary | ICD-10-CM

## 2012-02-17 DIAGNOSIS — G8929 Other chronic pain: Secondary | ICD-10-CM

## 2012-02-17 DIAGNOSIS — F17200 Nicotine dependence, unspecified, uncomplicated: Secondary | ICD-10-CM

## 2012-02-17 DIAGNOSIS — E785 Hyperlipidemia, unspecified: Secondary | ICD-10-CM

## 2012-02-17 MED ORDER — AMLODIPINE BESYLATE 10 MG PO TABS: 10.0000 mg | ORAL_TABLET | Freq: Every day | ORAL | Status: DC

## 2012-02-17 MED ORDER — TRIAMTERENE-HCTZ 37.5-25 MG PO TABS: 1.0000 | ORAL_TABLET | Freq: Every day | ORAL | Status: DC

## 2012-02-17 MED ORDER — HYDROCODONE-ACETAMINOPHEN 5-500 MG PO TABS: 1.0000 | ORAL_TABLET | Freq: Two times a day (BID) | ORAL | Status: DC | PRN

## 2012-02-17 MED ORDER — GABAPENTIN 300 MG PO CAPS: 300.0000 mg | ORAL_CAPSULE | Freq: Three times a day (TID) | ORAL | Status: DC

## 2012-02-17 MED ORDER — CYCLOBENZAPRINE HCL 10 MG PO TABS: 10.0000 mg | ORAL_TABLET | Freq: Three times a day (TID) | ORAL | Status: DC | PRN

## 2012-02-17 NOTE — Patient Instructions (Signed)
Start the blood pressure medications maxide and norvasc F/U Thursday morning Lab slip given

## 2012-02-17 NOTE — Assessment & Plan Note (Signed)
I discussed with patient importance of medications. She has history of cerebrovascular accident. Her blood pressure today is extremely high. The sides headache she does not have any other problems. She is due for labs but states she cannot get them done today because of prior arrangements with her family. She will restart her Maxzide as well as her amlodipine. She's to followup on Thursday morning and will have blood drawn at that time. I stressed the importance of her keeping her followup appointment that she often cancels at the last minute.
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
## 2012-02-17 NOTE — Assessment & Plan Note (Signed)
Continues to smoke a  Few cig

## 2012-02-17 NOTE — Assessment & Plan Note (Signed)
Deteriorated  Weight up 5lbs though she tells me today, she eats no fried foods, is following a semi-adkins diet, drinks water

## 2012-02-17 NOTE — Progress Notes (Signed)
  Subjective:    Patient ID: Joyce [name], female    DOB: September 10, 1955, 56 y.o.   MRN: [id number]  HPI   Patient presents to followup chronic medical problems. She was last seen in April 2013. She's been off of her medication ssince that time. States she had financial difficulties and has not been able to get her blood pressure medications. He also had difficulties with her Medicare. She's been having increased headaches over the past month on and off and she knows is due to her blood pressure. She's had a history of stroke in the past. She's been taking Aleve every 8 hours for her back pain and she is unable to afford her narcotic medication. She is no longer walking with her cane which is an improvement. She is due for labs. She has been trying to watch her diet, since she does not have her medications Review of Systems  GEN- denies fatigue, fever, weight loss,weakness, recent illness HEENT- denies eye drainage, change in vision, nasal discharge, CVS- denies chest pain, palpitations RESP- denies SOB, cough, wheeze ABD- denies N/V, change in stools, abd pain GU- denies dysuria, hematuria, dribbling, incontinence MSK- + joint pain, muscle aches, injury Neuro- + headache, denies dizziness, syncope, seizure activity      Objective:   Physical Exam GEN- NAD, alert and oriented x3, obese HEENT- PERRL, EOMI, non injected sclera, pink conjunctiva, MMM, oropharynx clear, fundoscopic exam no papilledema  Neck- Supple, CVS- RRR, no murmur RESP-CTAB ABD-NABS,soft,NT,ND EXT- trace edema Pulses- Radial, DP- 2+        Assessment & Plan:

## 2012-02-17 NOTE — Assessment & Plan Note (Signed)
Check FLP then restart lipitor

## 2012-02-20 ENCOUNTER — Ambulatory Visit: Payer: Medicare Other | Admitting: Family Medicine

## 2012-02-21 ENCOUNTER — Telehealth: Payer: Self-pay | Admitting: Family Medicine

## 2012-02-26 NOTE — Telephone Encounter (Signed)
error 

## 2012-06-09 ENCOUNTER — Encounter (HOSPITAL_COMMUNITY): Payer: Self-pay

## 2012-06-09 ENCOUNTER — Emergency Department (HOSPITAL_COMMUNITY)
Admission: EM | Admit: 2012-06-09 | Discharge: 2012-06-09 | Disposition: A | Discharge status: Home / Self Care | Payer: Medicare Other | Attending: Emergency Medicine | Admitting: Emergency Medicine

## 2012-06-09 ENCOUNTER — Emergency Department (HOSPITAL_COMMUNITY): Payer: Medicare Other

## 2012-06-09 DIAGNOSIS — J3489 Other specified disorders of nose and nasal sinuses: Secondary | ICD-10-CM | POA: Insufficient documentation

## 2012-06-09 DIAGNOSIS — Z8673 Personal history of transient ischemic attack (TIA), and cerebral infarction without residual deficits: Secondary | ICD-10-CM | POA: Insufficient documentation

## 2012-06-09 DIAGNOSIS — Z720 Tobacco use: Secondary | ICD-10-CM

## 2012-06-09 DIAGNOSIS — F17200 Nicotine dependence, unspecified, uncomplicated: Secondary | ICD-10-CM | POA: Insufficient documentation

## 2012-06-09 DIAGNOSIS — R110 Nausea: Secondary | ICD-10-CM | POA: Insufficient documentation

## 2012-06-09 DIAGNOSIS — I10 Essential (primary) hypertension: Secondary | ICD-10-CM

## 2012-06-09 DIAGNOSIS — J40 Bronchitis, not specified as acute or chronic: Secondary | ICD-10-CM | POA: Insufficient documentation

## 2012-06-09 DIAGNOSIS — Z79899 Other long term (current) drug therapy: Secondary | ICD-10-CM | POA: Insufficient documentation

## 2012-06-09 DIAGNOSIS — Z91199 Patient's noncompliance with other medical treatment and regimen due to unspecified reason: Secondary | ICD-10-CM | POA: Insufficient documentation

## 2012-06-09 DIAGNOSIS — R5381 Other malaise: Secondary | ICD-10-CM | POA: Insufficient documentation

## 2012-06-09 DIAGNOSIS — R42 Dizziness and giddiness: Secondary | ICD-10-CM | POA: Insufficient documentation

## 2012-06-09 DIAGNOSIS — M545 Low back pain, unspecified: Secondary | ICD-10-CM | POA: Insufficient documentation

## 2012-06-09 DIAGNOSIS — Z9089 Acquired absence of other organs: Secondary | ICD-10-CM | POA: Insufficient documentation

## 2012-06-09 DIAGNOSIS — IMO0002 Reserved for concepts with insufficient information to code with codable children: Secondary | ICD-10-CM | POA: Insufficient documentation

## 2012-06-09 DIAGNOSIS — E785 Hyperlipidemia, unspecified: Secondary | ICD-10-CM | POA: Insufficient documentation

## 2012-06-09 DIAGNOSIS — Z9119 Patient's noncompliance with other medical treatment and regimen: Secondary | ICD-10-CM

## 2012-06-09 DIAGNOSIS — R509 Fever, unspecified: Secondary | ICD-10-CM | POA: Insufficient documentation

## 2012-06-09 MED ORDER — ALBUTEROL SULFATE HFA 108 (90 BASE) MCG/ACT IN AERS
3.0000 | INHALATION_SPRAY | Freq: Once | RESPIRATORY_TRACT | Status: AC
  Administered 2012-06-09: 3 via RESPIRATORY_TRACT
  Filled 2012-06-09: qty 6.7

## 2012-06-09 MED ORDER — DOXYCYCLINE HYCLATE 100 MG PO CAPS: 100.0000 mg | ORAL_CAPSULE | Freq: Two times a day (BID) | ORAL | Status: DC

## 2012-06-09 MED ORDER — HYDROCODONE-ACETAMINOPHEN 5-325 MG PO TABS
2.0000 | ORAL_TABLET | Freq: Once | ORAL | Status: AC
  Administered 2012-06-09: 2 via ORAL
  Filled 2012-06-09: qty 2

## 2012-06-09 MED ORDER — TRIAMTERENE-HCTZ 37.5-25 MG PO TABS
1.0000 | ORAL_TABLET | Freq: Every day | ORAL | Status: DC
  Administered 2012-06-09: 1 via ORAL
  Filled 2012-06-09 (×3): qty 1

## 2012-06-09 MED ORDER — PREDNISONE 50 MG PO TABS
60.0000 mg | ORAL_TABLET | Freq: Once | ORAL | Status: AC
  Administered 2012-06-09: 60 mg via ORAL
  Filled 2012-06-09: qty 1

## 2012-06-09 MED ORDER — AMLODIPINE BESYLATE 5 MG PO TABS
10.0000 mg | ORAL_TABLET | Freq: Every day | ORAL | Status: DC
  Administered 2012-06-09: 10 mg via ORAL
  Filled 2012-06-09: qty 2

## 2012-06-09 MED ORDER — AEROCHAMBER Z-STAT PLUS/MEDIUM MISC: 1.0000 | Freq: Once | Status: DC

## 2012-06-09 MED ORDER — HYDROCODONE-ACETAMINOPHEN 5-325 MG PO TABS: 1.0000 | ORAL_TABLET | ORAL | Status: DC | PRN

## 2012-06-09 MED ORDER — AMLODIPINE BESYLATE 10 MG PO TABS: 10.0000 mg | ORAL_TABLET | Freq: Every day | ORAL | Status: DC

## 2012-06-09 MED ORDER — TRIAMTERENE-HCTZ 37.5-25 MG PO TABS: 1.0000 | ORAL_TABLET | Freq: Every day | ORAL | Status: DC

## 2012-06-09 MED ORDER — PREDNISONE 20 MG PO TABS: 20.0000 mg | ORAL_TABLET | Freq: Two times a day (BID) | ORAL | Status: DC
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
## 2012-06-09 NOTE — ED Notes (Signed)
MD at bedside. 

## 2012-06-09 NOTE — ED Notes (Signed)
Pt states that she has ran out of HTN med for a month

## 2012-06-09 NOTE — ED Notes (Signed)
Pt c/o cold symptoms, headache, and laryngitis x 1 week.  Also c/o lower back pain.  Unsure of fever.

## 2012-11-24 ENCOUNTER — Ambulatory Visit: Payer: Medicare Other | Admitting: Family Medicine

## 2012-12-01 ENCOUNTER — Encounter: Payer: Self-pay | Admitting: Family Medicine

## 2012-12-01 ENCOUNTER — Ambulatory Visit (INDEPENDENT_AMBULATORY_CARE_PROVIDER_SITE_OTHER): Payer: Medicare Other | Admitting: Family Medicine

## 2012-12-01 VITALS — BP 194/112 | HR 73 | Resp 16 | Wt 239.1 lb

## 2012-12-01 DIAGNOSIS — M25561 Pain in right knee: Secondary | ICD-10-CM

## 2012-12-01 DIAGNOSIS — F17200 Nicotine dependence, unspecified, uncomplicated: Secondary | ICD-10-CM

## 2012-12-01 DIAGNOSIS — Z91199 Patient's noncompliance with other medical treatment and regimen due to unspecified reason: Secondary | ICD-10-CM

## 2012-12-01 DIAGNOSIS — M25569 Pain in unspecified knee: Secondary | ICD-10-CM

## 2012-12-01 DIAGNOSIS — Z91148 Patient's other noncompliance with medication regimen for other reason: Secondary | ICD-10-CM

## 2012-12-01 DIAGNOSIS — I10 Essential (primary) hypertension: Secondary | ICD-10-CM

## 2012-12-01 DIAGNOSIS — M549 Dorsalgia, unspecified: Secondary | ICD-10-CM

## 2012-12-01 DIAGNOSIS — G8929 Other chronic pain: Secondary | ICD-10-CM

## 2012-12-01 DIAGNOSIS — Z9119 Patient's noncompliance with other medical treatment and regimen: Secondary | ICD-10-CM

## 2012-12-01 DIAGNOSIS — Z720 Tobacco use: Secondary | ICD-10-CM

## 2012-12-01 DIAGNOSIS — Z9114 Patient's other noncompliance with medication regimen: Secondary | ICD-10-CM

## 2012-12-01 DIAGNOSIS — E669 Obesity, unspecified: Secondary | ICD-10-CM

## 2012-12-01 MED ORDER — AMLODIPINE BESYLATE 10 MG PO TABS: 10.0000 mg | ORAL_TABLET | Freq: Every day | ORAL | Status: DC

## 2012-12-01 MED ORDER — KETOROLAC TROMETHAMINE 30 MG/ML IJ SOLN
30.0000 mg | Freq: Once | INTRAMUSCULAR | Status: AC
  Administered 2012-12-01: 30 mg via INTRAMUSCULAR

## 2012-12-01 MED ORDER — HYDROCODONE-ACETAMINOPHEN 5-325 MG PO TABS: 1.0000 | ORAL_TABLET | ORAL | Status: DC | PRN

## 2012-12-01 MED ORDER — TRIAMTERENE-HCTZ 37.5-25 MG PO TABS: 1.0000 | ORAL_TABLET | Freq: Every day | ORAL | Status: DC

## 2012-12-01 NOTE — Patient Instructions (Signed)
Pick up the blood pressure medication today and take 1 dose of each Get the labs done today F/U THursday for blood pressure

## 2012-12-01 NOTE — Progress Notes (Signed)
  Subjective:    Patient ID: Joyce [name], female    DOB: 06-Mar-1956, 57 y.o.   MRN: [id number]  HPI  Patient here to follow chronic medical problems. She's not been seen since July at that time she was restarted on her blood pressure medication but not return for her followup visit she states she did not have the money for visits or medication and she's been helping her family out including her mother who has cancer. She has been noncompliant with her medications for quite some time. She also complains of her chronic back pain and knee pain which she had a total knee replacement on the right side and has noticed some swelling and she's been up on her legs more over the past couple months. She states that she is dedicated to getting her health back on track. She denies any chest pain shortness of breath headache from her blood pressure. She's not had labs since 2012. Note she was in the emergency room around November of last year was given prescription for blood pressure medications but she did not get filled at that time  Review of Systems  GEN- denies fatigue, fever, weight loss,weakness, recent illness HEENT- denies eye drainage, change in vision, nasal discharge, CVS- denies chest pain, palpitations RESP- denies SOB, cough, wheeze ABD- denies N/V, change in stools, abd pain GU- denies dysuria, hematuria, dribbling, incontinence MSK-+ joint pain, muscle aches, injury Neuro- denies headache, dizziness, syncope, seizure activity      Objective:   Physical Exam GEN- NAD, alert and oriented x3 , repeat BP 208/118 HEENT- PERRL, EOMI, non injected sclera, pink conjunctiva, MMM, oropharynx clear Neck- Supple,  CVS- RRR, no murmur RESP-CTAB MSK- Knee- mild swelling of right knee, antalgic gait  EXT-trace pedal edema Pulses- Radial, DP- 2+        Assessment & Plan:

## 2012-12-02 ENCOUNTER — Other Ambulatory Visit: Payer: Self-pay

## 2012-12-02 DIAGNOSIS — M25569 Pain in unspecified knee: Secondary | ICD-10-CM | POA: Insufficient documentation

## 2012-12-02 DIAGNOSIS — Z91148 Patient's other noncompliance with medication regimen for other reason: Secondary | ICD-10-CM | POA: Insufficient documentation

## 2012-12-02 DIAGNOSIS — Z9114 Patient's other noncompliance with medication regimen: Secondary | ICD-10-CM | POA: Insufficient documentation

## 2012-12-02 MED ORDER — NICOTINE 14 MG/24HR TD PT24: 1.0000 | MEDICATED_PATCH | TRANSDERMAL | Status: DC

## 2012-12-02 NOTE — Assessment & Plan Note (Signed)
On narcotic contract, given refill pain meds

## 2012-12-02 NOTE — Assessment & Plan Note (Signed)
unchanged

## 2012-12-02 NOTE — Assessment & Plan Note (Signed)
Uncontrolled, restart meds, states able to buy today, no CP, SOB, HA, non complaint RTC in 48 hours for recheck Fasting labs needed, but fear she may not return will get bmet, cbc today

## 2012-12-02 NOTE — Assessment & Plan Note (Signed)
S/p replacement, overuse, pain meds given, will see if she can follow-up with ortho if she returns for visit, ICE

## 2012-12-02 NOTE — Assessment & Plan Note (Signed)
Given information for quit line, smoking patches, sent ? She can afford

## 2012-12-03 ENCOUNTER — Telehealth: Payer: Self-pay | Admitting: Family Medicine

## 2012-12-03 ENCOUNTER — Ambulatory Visit (INDEPENDENT_AMBULATORY_CARE_PROVIDER_SITE_OTHER): Payer: Medicare Other | Admitting: Family Medicine

## 2012-12-03 ENCOUNTER — Encounter: Payer: Self-pay | Admitting: Family Medicine

## 2012-12-03 ENCOUNTER — Ambulatory Visit (HOSPITAL_COMMUNITY)
Admission: RE | Admit: 2012-12-03 | Discharge: 2012-12-03 | Disposition: A | Discharge status: Home / Self Care | Payer: Medicare Other | Source: Ambulatory Visit | Attending: Family Medicine | Admitting: Family Medicine

## 2012-12-03 VITALS — BP 178/98 | HR 62 | Resp 16 | Wt 240.0 lb

## 2012-12-03 DIAGNOSIS — M25569 Pain in unspecified knee: Secondary | ICD-10-CM | POA: Insufficient documentation

## 2012-12-03 DIAGNOSIS — R059 Cough, unspecified: Secondary | ICD-10-CM

## 2012-12-03 DIAGNOSIS — R05 Cough: Secondary | ICD-10-CM

## 2012-12-03 DIAGNOSIS — I10 Essential (primary) hypertension: Secondary | ICD-10-CM

## 2012-12-03 DIAGNOSIS — R0602 Shortness of breath: Secondary | ICD-10-CM | POA: Insufficient documentation

## 2012-12-03 DIAGNOSIS — M25561 Pain in right knee: Secondary | ICD-10-CM

## 2012-12-03 DIAGNOSIS — F17200 Nicotine dependence, unspecified, uncomplicated: Secondary | ICD-10-CM

## 2012-12-03 DIAGNOSIS — Z720 Tobacco use: Secondary | ICD-10-CM

## 2012-12-03 DIAGNOSIS — Z1231 Encounter for screening mammogram for malignant neoplasm of breast: Secondary | ICD-10-CM

## 2012-12-03 LAB — CBC
Hemoglobin: 13.4 g/dL (ref 12.0–15.0)
MCHC: 34.4 g/dL (ref 30.0–36.0)
Platelets: 338 10*3/uL (ref 150–400)
RDW: 14.5 % (ref 11.5–15.5)

## 2012-12-03 LAB — BASIC METABOLIC PANEL
BUN: 14 mg/dL (ref 6–23)
CO2: 25 mEq/L (ref 19–32)
Calcium: 9.4 mg/dL (ref 8.4–10.5)
Creat: 1.18 mg/dL — ABNORMAL HIGH (ref 0.50–1.10)

## 2012-12-03 LAB — TSH: TSH: 2.709 u[IU]/mL (ref 0.350–4.500)

## 2012-12-03 MED ORDER — GABAPENTIN 300 MG PO CAPS: 300.0000 mg | ORAL_CAPSULE | Freq: Three times a day (TID) | ORAL | Status: DC

## 2012-12-03 MED ORDER — METOPROLOL SUCCINATE ER 25 MG PO TB24: 25.0000 mg | ORAL_TABLET | Freq: Every day | ORAL | Status: DC

## 2012-12-03 NOTE — Assessment & Plan Note (Signed)
Blood pressure is improved today however we still have quite a way to go. I will have her get her labs is going to check her renal function and decide on adding ACE inhibitor versus beta blocker to her regimen.

## 2012-12-03 NOTE — Addendum Note (Signed)
Addended by: Milinda Antis F on: 12/03/2012 11:43 PM   Modules accepted: Orders

## 2012-12-03 NOTE — Patient Instructions (Addendum)
Mammogram - call and schedule  Get the labs done Continue the current medications, 3rd medication will be needed, depending on labs Gabapentin sent Xray of knee to be done Referral to orthopedics- Dr. Romeo Apple F/U 6 weeks Bethann Humble for blood pressure

## 2012-12-03 NOTE — Progress Notes (Signed)
  Subjective:    Patient ID: Joyce [name], female    DOB: Jul 21, 1956, 57 y.o.   MRN: [id number]  HPI Patient here to followup blood pressure. She was seen 48 hours ago was restarted on Maxzide and Norvasc 10 mg. She is taking 2 doses of both medications without any difficulty. Continues to have swelling and pain in her knee which she had replaced back in 2006/2007 Labs to be done today  Review of Systems  GEN- denies fatigue, fever, weight loss,weakness, recent illness HEENT- denies eye drainage, change in vision, nasal discharge, CVS- denies chest pain, palpitations RESP- denies SOB, cough, wheeze MSK-+ joint pain, muscle aches, injury Neuro- denies headache, dizziness, syncope, seizure activity       Objective:   Physical Exam  GEN- NAD, alert and oriented x3 CVS- RRR, no murmur RESP-CTAB EXT- trace pedal edema Pulses- Radial 2+       Assessment & Plan:

## 2012-12-03 NOTE — Telephone Encounter (Signed)
Patient aware she is supposed to be using the patch

## 2012-12-03 NOTE — Assessment & Plan Note (Signed)
CXR done, no nodules, nicoderm patches sent

## 2012-12-03 NOTE — Assessment & Plan Note (Signed)
X-ray shows no acute findings with replacement, will go ahead and refer to ortho On vicodin

## 2012-12-08 ENCOUNTER — Ambulatory Visit (HOSPITAL_COMMUNITY)
Admission: RE | Admit: 2012-12-08 | Discharge: 2012-12-08 | Disposition: A | Discharge status: Home / Self Care | Payer: Medicare Other | Source: Ambulatory Visit | Attending: Family Medicine | Admitting: Family Medicine

## 2012-12-08 ENCOUNTER — Telehealth: Payer: Self-pay | Admitting: Family Medicine

## 2012-12-08 DIAGNOSIS — Z1231 Encounter for screening mammogram for malignant neoplasm of breast: Secondary | ICD-10-CM | POA: Insufficient documentation

## 2012-12-08 NOTE — Telephone Encounter (Signed)
She can try to take 2 of the tablets she has, along with the gabapentin I can not give anything else at this time  When can discuss at her follow-up appt

## 2012-12-09 NOTE — Telephone Encounter (Signed)
Patient aware.

## 2012-12-21 ENCOUNTER — Telehealth: Payer: Self-pay | Admitting: Family Medicine

## 2012-12-21 MED ORDER — HYDROCODONE-ACETAMINOPHEN 10-325 MG PO TABS: 1.0000 | ORAL_TABLET | Freq: Four times a day (QID) | ORAL | Status: DC | PRN

## 2012-12-21 NOTE — Telephone Encounter (Signed)
She can be changed to 10mg  hydrocodone, we will have to discuss pain management at next visit in office.

## 2012-12-21 NOTE — Telephone Encounter (Signed)
Patient made aware.   rx faxed.

## 2012-12-23 ENCOUNTER — Ambulatory Visit (INDEPENDENT_AMBULATORY_CARE_PROVIDER_SITE_OTHER): Payer: Medicare Other | Admitting: Orthopedic Surgery

## 2012-12-23 VITALS — BP 160/90 | Ht 67.0 in | Wt 234.0 lb

## 2012-12-23 DIAGNOSIS — Z96651 Presence of right artificial knee joint: Secondary | ICD-10-CM

## 2012-12-23 DIAGNOSIS — Z96659 Presence of unspecified artificial knee joint: Secondary | ICD-10-CM

## 2012-12-23 DIAGNOSIS — M545 Low back pain, unspecified: Secondary | ICD-10-CM | POA: Insufficient documentation

## 2012-12-23 DIAGNOSIS — IMO0002 Reserved for concepts with insufficient information to code with codable children: Secondary | ICD-10-CM

## 2012-12-23 DIAGNOSIS — M7050 Other bursitis of knee, unspecified knee: Secondary | ICD-10-CM

## 2012-12-23 NOTE — Patient Instructions (Addendum)
Will have x-rays and mri and then be referred to neurosurgery   Knee Injection Joint injections are shots. Your caregiver will place a needle into your knee joint. The needle is used to put medicine into the joint. These shots can be used to help treat different painful knee conditions such as osteoarthritis, bursitis, local flare-ups of rheumatoid arthritis, and pseudogout. Anti-inflammatory medicines such as corticosteroids and anesthetics are the most common medicines used for joint and soft tissue injections.  PROCEDURE  The skin over the kneecap will be cleaned with an antiseptic solution.  Your caregiver will inject a small amount of a local anesthetic (a medicine like Novocaine) just under the skin in the area that was cleaned.  After the area becomes numb, a second injection is done. This second injection usually includes an anesthetic and an anti-inflammatory medicine called a steroid or cortisone. The needle is carefully placed in between the kneecap and the knee, and the medicine is injected into the joint space.  After the injection is done, the needle is removed. Your caregiver may place a bandage over the injection site. The whole procedure takes no more than a couple of minutes. BEFORE THE PROCEDURE  Wash all of the skin around the entire knee area. Try to remove any loose, scaling skin. There is no other specific preparation necessary unless advised otherwise by your caregiver. LET YOUR CAREGIVER KNOW ABOUT:   Allergies.  Medications taken including herbs, eye drops, over the counter medications, and creams.  Use of steroids (by mouth or creams).  Possible pregnancy, if applicable.  Previous problems with anesthetics or Novocaine.  History of blood clots (thrombophlebitis).  History of bleeding or blood problems.  Previous surgery.  Other health problems. RISKS AND COMPLICATIONS Side effects from cortisone shots are rare. They include:   Slight bruising of the  skin.  Shrinkage of the normal fatty tissue under the skin where the shot was given.  Increase in pain after the shot.  Infection.  Weakening of tendons or tendon rupture.  Allergic reaction to the medicine.  Diabetics may have a temporary increase in their blood sugar after a shot.  Cortisone can temporarily weaken the immune system. While receiving these shots, you should not get certain vaccines. Also, avoid contact with anyone who has chickenpox or measles. Especially if you have never had these diseases or have not been previously immunized. Your immune system may not be strong enough to fight off the infection while the cortisone is in your system. AFTER THE PROCEDURE   You can go home after the procedure.  You may need to put ice on the joint 15-20 minutes every 3 or 4 hours until the pain goes away.  You may need to put an elastic bandage on the joint. HOME CARE INSTRUCTIONS   Only take over-the-counter or prescription medicines for pain, discomfort, or fever as directed by your caregiver.  You should avoid stressing the joint. Unless advised otherwise, avoid activities that put a lot of pressure on a knee joint, such as:  Jogging.  Bicycling.  Recreational climbing.  Hiking.  Laying down and elevating the leg/knee above the level of your heart can help to minimize swelling. SEEK MEDICAL CARE IF:   You have repeated or worsening swelling.  There is drainage from the puncture area.  You develop red streaking that extends above or below the site where the needle was inserted. SEEK IMMEDIATE MEDICAL CARE IF:   You develop a fever.  You have pain that  gets worse even though you are taking pain medicine.  The area is red and warm, and you have trouble moving the joint. MAKE SURE YOU:   Understand these instructions.  Will watch your condition.  Will get help right away if you are not doing well or get worse. Document Released: 10/13/2006 Document Revised:  10/14/2011 Document Reviewed: 07/10/2007 [facility] Patient Information 2014 [place], [place]. Knee Injection Joint injections are shots. Your caregiver will place a needle into your knee joint. The needle is used to put medicine into the joint. These shots can be used to help treat different painful knee conditions such as osteoarthritis, bursitis, local flare-ups of rheumatoid arthritis, and pseudogout. Anti-inflammatory medicines such as corticosteroids and anesthetics are the most common medicines used for joint and soft tissue injections.  PROCEDURE  The skin over the kneecap will be cleaned with an antiseptic solution.  Your caregiver will inject a small amount of a local anesthetic (a medicine like Novocaine) just under the skin in the area that was cleaned.  After the area becomes numb, a second injection is done. This second injection usually includes an anesthetic and an anti-inflammatory medicine called a steroid or cortisone. The needle is carefully placed in between the kneecap and the knee, and the medicine is injected into the joint space.  After the injection is done, the needle is removed. Your caregiver may place a bandage over the injection site. The whole procedure takes no more than a couple of minutes. BEFORE THE PROCEDURE  Wash all of the skin around the entire knee area. Try to remove any loose, scaling skin. There is no other specific preparation necessary unless advised otherwise by your caregiver. LET YOUR CAREGIVER KNOW ABOUT:   Allergies.  Medications taken including herbs, eye drops, over the counter medications, and creams.  Use of steroids (by mouth or creams).  Possible pregnancy, if applicable.  Previous problems with anesthetics or Novocaine.  History of blood clots (thrombophlebitis).  History of bleeding or blood problems.  Previous surgery.  Other health problems. RISKS AND COMPLICATIONS Side effects from cortisone shots are rare. They include:    Slight bruising of the skin.  Shrinkage of the normal fatty tissue under the skin where the shot was given.  Increase in pain after the shot.  Infection.  Weakening of tendons or tendon rupture.  Allergic reaction to the medicine.  Diabetics may have a temporary increase in their blood sugar after a shot.  Cortisone can temporarily weaken the immune system. While receiving these shots, you should not get certain vaccines. Also, avoid contact with anyone who has chickenpox or measles. Especially if you have never had these diseases or have not been previously immunized. Your immune system may not be strong enough to fight off the infection while the cortisone is in your system. AFTER THE PROCEDURE   You can go home after the procedure.  You may need to put ice on the joint 15-20 minutes every 3 or 4 hours until the pain goes away.  You may need to put an elastic bandage on the joint. HOME CARE INSTRUCTIONS   Only take over-the-counter or prescription medicines for pain, discomfort, or fever as directed by your caregiver.  You should avoid stressing the joint. Unless advised otherwise, avoid activities that put a lot of pressure on a knee joint, such as:  Jogging.  Bicycling.  Recreational climbing.  Hiking.  Laying down and elevating the leg/knee above the level of your heart can help to minimize  swelling. SEEK MEDICAL CARE IF:   You have repeated or worsening swelling.  There is drainage from the puncture area.  You develop red streaking that extends above or below the site where the needle was inserted. SEEK IMMEDIATE MEDICAL CARE IF:   You develop a fever.  You have pain that gets worse even though you are taking pain medicine.  The area is red and warm, and you have trouble moving the joint. MAKE SURE YOU:   Understand these instructions.  Will watch your condition.  Will get help right away if you are not doing well or get worse. Document Released:  10/13/2006 Document Revised: 10/14/2011 Document Reviewed: 07/10/2007 [facility] Patient Information 2014 [place], [place].

## 2012-12-24 ENCOUNTER — Encounter: Payer: Self-pay | Admitting: Orthopedic Surgery

## 2012-12-24 DIAGNOSIS — M7050 Other bursitis of knee, unspecified knee: Secondary | ICD-10-CM | POA: Insufficient documentation

## 2012-12-24 DIAGNOSIS — Z96659 Presence of unspecified artificial knee joint: Secondary | ICD-10-CM | POA: Insufficient documentation
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
## 2012-12-30 ENCOUNTER — Telehealth: Payer: Self-pay | Admitting: Radiology

## 2012-12-30 NOTE — Telephone Encounter (Signed)
Patient has MRI at [facility] on 01-01-13 at 12:45. Patient has Medicare, no precert is needed. Dr. Romeo Apple will call the patient with her results.

## 2013-01-01 ENCOUNTER — Ambulatory Visit (HOSPITAL_COMMUNITY)
Admission: RE | Admit: 2013-01-01 | Discharge: 2013-01-01 | Disposition: A | Discharge status: Home / Self Care | Payer: Medicare Other | Source: Ambulatory Visit | Attending: Orthopedic Surgery | Admitting: Orthopedic Surgery

## 2013-01-01 ENCOUNTER — Encounter (HOSPITAL_COMMUNITY): Payer: Self-pay

## 2013-01-01 DIAGNOSIS — M1990 Unspecified osteoarthritis, unspecified site: Secondary | ICD-10-CM | POA: Insufficient documentation

## 2013-01-01 DIAGNOSIS — M545 Low back pain, unspecified: Secondary | ICD-10-CM

## 2013-01-04 ENCOUNTER — Telehealth: Payer: Self-pay | Admitting: Orthopedic Surgery

## 2013-01-04 NOTE — Telephone Encounter (Signed)
Joyce [name] asked if you will call her with the MRI results. Her # [phone]

## 2013-01-06 ENCOUNTER — Telehealth: Payer: Self-pay | Admitting: *Deleted

## 2013-01-06 ENCOUNTER — Other Ambulatory Visit: Payer: Self-pay | Admitting: *Deleted

## 2013-01-06 ENCOUNTER — Encounter: Payer: Self-pay | Admitting: Orthopedic Surgery

## 2013-01-06 DIAGNOSIS — M545 Low back pain, unspecified: Secondary | ICD-10-CM

## 2013-01-06 NOTE — Telephone Encounter (Signed)
Faxed referral and office notes to [facility]. Awaiting appointment. 

## 2013-01-06 NOTE — Progress Notes (Signed)
Patient ID: Joyce [name], female   DOB: September 12, 1955, 57 y.o.   MRN: [id number] Advise neurosurgery referral;  Depending on insurance.

## 2013-01-19 ENCOUNTER — Encounter: Payer: Self-pay | Admitting: Family Medicine

## 2013-01-19 ENCOUNTER — Ambulatory Visit (INDEPENDENT_AMBULATORY_CARE_PROVIDER_SITE_OTHER): Payer: Medicare Other | Admitting: Family Medicine

## 2013-01-19 VITALS — BP 170/80 | HR 76 | Temp 98.5°F | Resp 20 | Ht 67.0 in | Wt 234.0 lb

## 2013-01-19 DIAGNOSIS — G8929 Other chronic pain: Secondary | ICD-10-CM

## 2013-01-19 DIAGNOSIS — M25561 Pain in right knee: Secondary | ICD-10-CM

## 2013-01-19 DIAGNOSIS — M549 Dorsalgia, unspecified: Secondary | ICD-10-CM

## 2013-01-19 DIAGNOSIS — I10 Essential (primary) hypertension: Secondary | ICD-10-CM

## 2013-01-19 DIAGNOSIS — M25569 Pain in unspecified knee: Secondary | ICD-10-CM

## 2013-01-19 MED ORDER — HYDROCODONE-ACETAMINOPHEN 10-325 MG PO TABS: 1.0000 | ORAL_TABLET | Freq: Four times a day (QID) | ORAL | Status: DC | PRN

## 2013-01-19 MED ORDER — GABAPENTIN 300 MG PO CAPS: 300.0000 mg | ORAL_CAPSULE | Freq: Three times a day (TID) | ORAL | Status: DC

## 2013-01-19 MED ORDER — METOPROLOL SUCCINATE ER 25 MG PO TB24: 25.0000 mg | ORAL_TABLET | Freq: Every day | ORAL | Status: DC

## 2013-01-19 NOTE — Assessment & Plan Note (Signed)
Pain meds refilled, increase gabapentin to 600mg  at night, 300mg  BID Pending referral to neurosurgery, she has been evaluated multiple times in the past, I am not sure if anything else besides meds can be done MRI shows mild changes with stenosis

## 2013-01-19 NOTE — Assessment & Plan Note (Signed)
Improving but still above goal 140 systolic Add Toprol to regimen, recheck 8 weeks

## 2013-01-19 NOTE — Assessment & Plan Note (Signed)
Reviewed Ortho note, s/p injection on hydrocodone

## 2013-01-19 NOTE — Patient Instructions (Addendum)
[facility] - call about your referral   [phone] Start the Toprol once a day for blood pressure Increase the gabapentin 600mg  at bedtime F/U 8 weeks blood pressure

## 2013-01-19 NOTE — Progress Notes (Signed)
  Subjective:    Patient ID: Joyce [name], female    DOB: [birth year], 57 y.o.   MRN: [id number]  HPI  Pt here to f/u blood pressure. Taking norvasc and maxzide as prescribed. No side effects. No CP, leg swelling improved  Seen by ortho, referred to Neuro surgery for her back which continues to give her problems. Steroid injection was done for her knee. MRI reviewed  Review of Systems  GEN- denies fatigue, fever, weight loss,weakness, recent illness HEENT- denies eye drainage, change in vision, nasal discharge, CVS- denies chest pain, palpitations RESP- denies SOB, cough, wheeze MSK- + joint pain, muscle aches, injury Neuro- denies headache, dizziness, syncope, seizure activity      Objective:   Physical Exam GEN- NAD, alert and oriented x3 CVS- RRR, no murmur RESP-CTAB EXT- trace pedal edema Pulses- Radial 2+        Assessment & Plan:

## 2013-02-08 ENCOUNTER — Telehealth: Payer: Self-pay | Admitting: Orthopedic Surgery

## 2013-02-08 NOTE — Telephone Encounter (Signed)
Called [facility] and they did not receive the referral. I re-faxed it today.

## 2013-02-08 NOTE — Telephone Encounter (Signed)
Joyce [name] has not heard anything from [place] about the referral appointment. Her # [phone]

## 2013-02-10 NOTE — Telephone Encounter (Signed)
Appointment with Dr.Pool 02/17/13 at 2:30 pm

## 2013-02-10 NOTE — Telephone Encounter (Signed)
Has appointment with [facility] 02/17/13 at 2:00 pm

## 2013-03-16 ENCOUNTER — Ambulatory Visit: Payer: Medicare Other | Admitting: Family Medicine

## 2013-04-01 ENCOUNTER — Encounter (HOSPITAL_COMMUNITY): Payer: Self-pay | Admitting: Emergency Medicine

## 2013-04-01 ENCOUNTER — Emergency Department (HOSPITAL_COMMUNITY)
Admission: EM | Admit: 2013-04-01 | Discharge: 2013-04-01 | Disposition: A | Discharge status: Home / Self Care | Payer: Medicare Other | Attending: Emergency Medicine | Admitting: Emergency Medicine

## 2013-04-01 DIAGNOSIS — Z862 Personal history of diseases of the blood and blood-forming organs and certain disorders involving the immune mechanism: Secondary | ICD-10-CM | POA: Insufficient documentation

## 2013-04-01 DIAGNOSIS — F17200 Nicotine dependence, unspecified, uncomplicated: Secondary | ICD-10-CM | POA: Insufficient documentation

## 2013-04-01 DIAGNOSIS — M545 Low back pain, unspecified: Secondary | ICD-10-CM | POA: Insufficient documentation

## 2013-04-01 DIAGNOSIS — G8929 Other chronic pain: Secondary | ICD-10-CM

## 2013-04-01 DIAGNOSIS — Z8639 Personal history of other endocrine, nutritional and metabolic disease: Secondary | ICD-10-CM | POA: Insufficient documentation

## 2013-04-01 DIAGNOSIS — Z8673 Personal history of transient ischemic attack (TIA), and cerebral infarction without residual deficits: Secondary | ICD-10-CM | POA: Insufficient documentation

## 2013-04-01 DIAGNOSIS — R110 Nausea: Secondary | ICD-10-CM | POA: Insufficient documentation

## 2013-04-01 DIAGNOSIS — I10 Essential (primary) hypertension: Secondary | ICD-10-CM | POA: Insufficient documentation

## 2013-04-01 DIAGNOSIS — Z79899 Other long term (current) drug therapy: Secondary | ICD-10-CM | POA: Insufficient documentation

## 2013-04-01 MED ORDER — HYDROMORPHONE HCL 2 MG PO TABS
2.0000 mg | ORAL_TABLET | Freq: Once | ORAL | Status: AC
  Administered 2013-04-01: 2 mg via ORAL
  Filled 2013-04-01: qty 1

## 2013-04-01 NOTE — ED Notes (Signed)
Chronic lower back pain radiating into hips and both legs. Just had injection last week. No better

## 2013-04-01 NOTE — ED Notes (Signed)
Pt c/o lower back pain that radiates down both legs. Pt states pain has progressively worsened over past 5 days.
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
## 2013-04-02 ENCOUNTER — Telehealth: Payer: Self-pay | Admitting: Family Medicine

## 2013-04-02 NOTE — Telephone Encounter (Signed)
Again calling insisting on being seen and insisting on Rx pain medication

## 2013-04-02 NOTE — Telephone Encounter (Signed)
Can you pull her up and print narcotic database

## 2013-04-02 NOTE — Telephone Encounter (Signed)
She will have to be seen on Tuesday She has received percocet 90tablets by her pain doctor on 8/21 I can not give her any pain medications She has to contact there office

## 2013-04-02 NOTE — Telephone Encounter (Signed)
Called patient.  Told there was little Dr Jeanice Lim could do for her.  Keep appt on Tuesday.  She can not write any further pain meds without jeopardizing care of pain specialist.  Pt states again about her high level of pain.  Medications not effective.  Has had to double up on them and knows this will cause her to run out earlier.  States has left several messages at Pain provider office with no response.  Will keep appt here Tuesday and hopefully get an answer from Pain doctor next week.

## 2013-04-02 NOTE — ED Provider Notes (Signed)
Medical screening examination/treatment/procedure(s) were performed by non-physician practitioner and as supervising physician I was immediately available for consultation/collaboration.   Dagmar Hait, MD 04/02/13 2101

## 2013-04-02 NOTE — Telephone Encounter (Signed)
Pt wants to be seen today due to having chronic pain, LBP and going down her legs, I told her the first available appt I had was on tues. She did not want to take that day demanded to be seen today. I told her that I woulld talk to Dr. Jeanice Lim and see if we can put you in a slot for today. Pt gave me her number so if Dr. Jeanice Lim agrees to pit in today I can call her.. Pt also wants refill on her Hydrocodone 5-325.Marland Kitchenwhich she is taking every 3hrs instead of every 4-6 hrs.

## 2013-04-06 ENCOUNTER — Ambulatory Visit: Payer: Medicare Other | Admitting: Family Medicine

## 2013-04-14 ENCOUNTER — Ambulatory Visit: Payer: Medicare Other | Admitting: Family Medicine

## 2013-04-21 ENCOUNTER — Encounter: Payer: Self-pay | Admitting: Family Medicine

## 2013-04-21 ENCOUNTER — Ambulatory Visit (INDEPENDENT_AMBULATORY_CARE_PROVIDER_SITE_OTHER): Payer: Medicare Other | Admitting: Family Medicine

## 2013-04-21 VITALS — BP 170/110 | HR 67 | Temp 98.2°F | Resp 16 | Wt 232.5 lb

## 2013-04-21 DIAGNOSIS — F17200 Nicotine dependence, unspecified, uncomplicated: Secondary | ICD-10-CM

## 2013-04-21 DIAGNOSIS — F4321 Adjustment disorder with depressed mood: Secondary | ICD-10-CM

## 2013-04-21 DIAGNOSIS — Z720 Tobacco use: Secondary | ICD-10-CM

## 2013-04-21 DIAGNOSIS — I10 Essential (primary) hypertension: Secondary | ICD-10-CM

## 2013-04-21 DIAGNOSIS — M549 Dorsalgia, unspecified: Secondary | ICD-10-CM

## 2013-04-21 DIAGNOSIS — G8929 Other chronic pain: Secondary | ICD-10-CM

## 2013-04-21 MED ORDER — BUPROPION HCL ER (SR) 150 MG PO TB12: 150.0000 mg | ORAL_TABLET | Freq: Two times a day (BID) | ORAL | Status: DC

## 2013-04-21 MED ORDER — DULOXETINE HCL 30 MG PO CPEP: 30.0000 mg | ORAL_CAPSULE | Freq: Every day | ORAL | Status: DC

## 2013-04-21 MED ORDER — METOPROLOL SUCCINATE ER 50 MG PO TB24: 50.0000 mg | ORAL_TABLET | Freq: Every day | ORAL | Status: DC

## 2013-04-21 NOTE — Assessment & Plan Note (Signed)
Uncontrolled blood pressure. She's not taking her medications for minor her the importance of adhering to her medication regimen. Her blood pressure was elevated some of the specialists office as well. I will go ahead and increase her Toprol 50 mg she will continue the Maxzide in the amlodipine.

## 2013-04-21 NOTE — Assessment & Plan Note (Signed)
Her depressed mood and stress is clearly related to her chronic pain. I do have a concern regarding her behavior ON how she is acting regarding her pain after multiple conversations with her before guarding how she went to treat her chronic pain. I'll start her on Cymbalta 30 mg we will recheck in 6 weeks

## 2013-04-21 NOTE — Progress Notes (Signed)
  Subjective:    Patient ID: Joyce [name], female    DOB: [birth year], 58 y.o.   MRN: [id number]  HPI  Patient here to followup blood pressure. She has multiple concerns mostly about her pain. She's currently being followed by pain management. She was seen by orthopedics for her leg weakness and pain in that she was referred to neurosurgery. She was evaluated evidently by neurosurgery and they decided that pain management is all that could be done she's been taking oxycodone by pain management though she's been refilling this on a 3 week basis. She was last seen on September 4 and given oxycodone 10 mg however she states is still not helping her pain. She did have an epidural injection is scheduled to have one tomorrow for those are also not helping. She states she has spasms all over was given Robaxin but it does not help and her insurance will not cover this. Her back pain is causing her to be depressed and very stressed out per report. She states she is are agreeable with her husband more it is always on it because of her pain. She is not sleeping as well and has been crying as well.   Hypertension she has not taken her medications today states she's been under a lot of stress and forgot to take them.  She would also like something to help her quit smoking   Review of Systems GEN- denies fatigue, fever, weight loss,weakness, recent illness HEENT- denies eye drainage, change in vision, nasal discharge, CVS- denies chest pain, palpitations RESP- denies SOB, cough, wheeze ABD- denies N/V, change in stools, abd pain GU- denies dysuria, hematuria, dribbling, incontinence MSK- + joint pain, muscle aches, injury Neuro- denies headache, dizziness, syncope, seizure activity      Objective:   Physical Exam  GEN- NAD, alert and oriented x3, repeat BP 190/120 CVS- RRR, no murmur RESP-CTAB EXT-Trace edema Pulses- Radial, DP- 2+ Psych- Walking around room, wincing , crying discussing her pain,  fair eye contact, not overly depressed or anxious appearing, no SI, well groomed      Assessment & Plan:

## 2013-04-21 NOTE — Assessment & Plan Note (Signed)
I would defer to her pain management specialist. I did discuss with patient that she came in stating that her pain was severe  but she would tolerate it and that she did not want to be on chronic pain medications at high doses and now she is requesting higher dose is needed pain medications , and is even call to my office requesting medications. I reminded her that she is on a pain contract in a contract she signed stated that she cannot get medications from other offices she states she was aware of this but when she was up [place] she was able to get emergency fills from her primary care provider. She is using an extensive amount of short acting medication based on the [employer]. She has followup with pain management tomorrow she can continue the discussion with him. I will start her on Cymbalta secondary to her underlying depression symptoms along with her chronic pain

## 2013-04-21 NOTE — Patient Instructions (Signed)
Come fasting to appt Take medications before appt Start cymbalta once a day for depression and pain Wellbutrin take 1 tablet a day for 3 days then 1 tablet twice a day for smoking  F/U 6 weeks

## 2013-04-21 NOTE — Assessment & Plan Note (Signed)
Insurance will not cover nicotine patches. We will go ahead and try her on the Zyban for smoking cessation

## 2013-06-02 ENCOUNTER — Ambulatory Visit: Payer: Medicare Other | Admitting: Family Medicine

## 2013-06-09 ENCOUNTER — Ambulatory Visit: Payer: Medicare Other | Admitting: Family Medicine

## 2013-06-10 ENCOUNTER — Emergency Department (HOSPITAL_COMMUNITY)
Admission: EM | Admit: 2013-06-10 | Discharge: 2013-06-10 | Disposition: A | Discharge status: Home / Self Care | Payer: PRIVATE HEALTH INSURANCE | Attending: Emergency Medicine | Admitting: Emergency Medicine

## 2013-06-10 ENCOUNTER — Encounter (HOSPITAL_COMMUNITY): Payer: Self-pay | Admitting: Emergency Medicine

## 2013-06-10 DIAGNOSIS — E669 Obesity, unspecified: Secondary | ICD-10-CM | POA: Insufficient documentation

## 2013-06-10 DIAGNOSIS — Z8639 Personal history of other endocrine, nutritional and metabolic disease: Secondary | ICD-10-CM | POA: Insufficient documentation

## 2013-06-10 DIAGNOSIS — M79604 Pain in right leg: Secondary | ICD-10-CM

## 2013-06-10 DIAGNOSIS — F17200 Nicotine dependence, unspecified, uncomplicated: Secondary | ICD-10-CM | POA: Insufficient documentation

## 2013-06-10 DIAGNOSIS — Z862 Personal history of diseases of the blood and blood-forming organs and certain disorders involving the immune mechanism: Secondary | ICD-10-CM | POA: Insufficient documentation

## 2013-06-10 DIAGNOSIS — R52 Pain, unspecified: Secondary | ICD-10-CM

## 2013-06-10 DIAGNOSIS — Z8673 Personal history of transient ischemic attack (TIA), and cerebral infarction without residual deficits: Secondary | ICD-10-CM | POA: Insufficient documentation

## 2013-06-10 DIAGNOSIS — G8929 Other chronic pain: Secondary | ICD-10-CM | POA: Insufficient documentation

## 2013-06-10 DIAGNOSIS — M545 Low back pain, unspecified: Secondary | ICD-10-CM | POA: Insufficient documentation

## 2013-06-10 DIAGNOSIS — I10 Essential (primary) hypertension: Secondary | ICD-10-CM | POA: Insufficient documentation

## 2013-06-10 DIAGNOSIS — M79609 Pain in unspecified limb: Secondary | ICD-10-CM | POA: Insufficient documentation

## 2013-06-10 DIAGNOSIS — Z79899 Other long term (current) drug therapy: Secondary | ICD-10-CM | POA: Insufficient documentation

## 2013-06-10 LAB — URINALYSIS, ROUTINE W REFLEX MICROSCOPIC
Glucose, UA: NEGATIVE mg/dL
Hgb urine dipstick: NEGATIVE
Leukocytes, UA: NEGATIVE
Protein, ur: NEGATIVE mg/dL
Specific Gravity, Urine: 1.02 (ref 1.005–1.030)
Urobilinogen, UA: 0.2 mg/dL (ref 0.0–1.0)

## 2013-06-10 MED ORDER — OXYCODONE-ACETAMINOPHEN 5-325 MG PO TABS
1.0000 | ORAL_TABLET | Freq: Once | ORAL | Status: AC
  Administered 2013-06-10: 1 via ORAL
  Filled 2013-06-10: qty 1

## 2013-06-10 MED ORDER — CYCLOBENZAPRINE HCL 10 MG PO TABS
10.0000 mg | ORAL_TABLET | Freq: Once | ORAL | Status: AC
  Administered 2013-06-10: 10 mg via ORAL
  Filled 2013-06-10: qty 1

## 2013-06-10 MED ORDER — OXYCODONE-ACETAMINOPHEN 10-325 MG PO TABS: 1.0000 | ORAL_TABLET | ORAL | Status: DC | PRN

## 2013-06-10 MED ORDER — METHOCARBAMOL 500 MG PO TABS: 500.0000 mg | ORAL_TABLET | Freq: Two times a day (BID) | ORAL | Status: DC

## 2013-06-10 NOTE — ED Notes (Signed)
Advised pt needed urine sample. Pt states pain meds starting to work and rating pain 8 and states does not think she can make it to restroom at this time. Pt agreed to in/out cath

## 2013-06-10 NOTE — ED Notes (Signed)
Low back pain with radiation down rt leg.  Being treated by Dr Ollen Bowl in [place].  Had appt today but missed it. And now is out of her meds.
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
## 2013-06-11 ENCOUNTER — Ambulatory Visit (HOSPITAL_COMMUNITY): Payer: PRIVATE HEALTH INSURANCE

## 2013-06-12 NOTE — ED Provider Notes (Signed)
Medical screening examination/treatment/procedure(s) were performed by non-physician practitioner and as supervising physician I was immediately available for consultation/collaboration.  EKG Interpretation   None        Yordan Martindale, MD 06/12/13 1547 

## 2013-06-15 ENCOUNTER — Ambulatory Visit (HOSPITAL_COMMUNITY)
Admission: RE | Admit: 2013-06-15 | Discharge: 2013-06-15 | Disposition: A | Discharge status: Home / Self Care | Payer: PRIVATE HEALTH INSURANCE | Source: Ambulatory Visit | Attending: Nurse Practitioner | Admitting: Nurse Practitioner

## 2013-06-15 DIAGNOSIS — M545 Low back pain, unspecified: Secondary | ICD-10-CM | POA: Insufficient documentation

## 2013-06-15 DIAGNOSIS — M48061 Spinal stenosis, lumbar region without neurogenic claudication: Secondary | ICD-10-CM | POA: Insufficient documentation

## 2013-06-15 DIAGNOSIS — M79609 Pain in unspecified limb: Secondary | ICD-10-CM | POA: Insufficient documentation

## 2013-06-15 DIAGNOSIS — M47817 Spondylosis without myelopathy or radiculopathy, lumbosacral region: Secondary | ICD-10-CM | POA: Insufficient documentation

## 2013-06-15 DIAGNOSIS — R52 Pain, unspecified: Secondary | ICD-10-CM

## 2013-06-15 DIAGNOSIS — M5126 Other intervertebral disc displacement, lumbar region: Secondary | ICD-10-CM | POA: Insufficient documentation

## 2013-07-21 ENCOUNTER — Telehealth: Payer: Self-pay | Admitting: *Deleted

## 2013-07-21 MED ORDER — AMLODIPINE BESYLATE 10 MG PO TABS: 10.0000 mg | ORAL_TABLET | Freq: Every day | ORAL | Status: DC

## 2013-07-21 MED ORDER — TRIAMTERENE-HCTZ 37.5-25 MG PO TABS: 1.0000 | ORAL_TABLET | Freq: Every day | ORAL | Status: DC

## 2013-07-21 NOTE — Telephone Encounter (Signed)
med

## 2013-07-21 NOTE — Telephone Encounter (Signed)
Med refilled.

## 2014-01-26 ENCOUNTER — Ambulatory Visit: Payer: Medicare Other | Admitting: Family Medicine

## 2014-02-11 ENCOUNTER — Ambulatory Visit: Payer: Medicare Other | Admitting: Family Medicine

## 2014-02-16 ENCOUNTER — Ambulatory Visit: Payer: Medicare Other | Admitting: Family Medicine

## 2014-03-04 ENCOUNTER — Other Ambulatory Visit (HOSPITAL_COMMUNITY): Payer: Self-pay | Admitting: Family Medicine

## 2014-03-04 DIAGNOSIS — Z139 Encounter for screening, unspecified: Secondary | ICD-10-CM

## 2014-03-04 DIAGNOSIS — Z1231 Encounter for screening mammogram for malignant neoplasm of breast: Secondary | ICD-10-CM

## 2014-03-09 ENCOUNTER — Ambulatory Visit (HOSPITAL_COMMUNITY): Payer: PRIVATE HEALTH INSURANCE

## 2014-03-18 ENCOUNTER — Ambulatory Visit (HOSPITAL_COMMUNITY)
Admission: RE | Admit: 2014-03-18 | Discharge: 2014-03-18 | Disposition: A | Discharge status: Home / Self Care | Payer: PRIVATE HEALTH INSURANCE | Source: Ambulatory Visit | Attending: Family Medicine | Admitting: Family Medicine

## 2014-03-18 DIAGNOSIS — Z1231 Encounter for screening mammogram for malignant neoplasm of breast: Secondary | ICD-10-CM | POA: Diagnosis not present

## 2014-11-07 ENCOUNTER — Other Ambulatory Visit: Payer: Self-pay | Admitting: Neurosurgery

## 2014-11-07 DIAGNOSIS — M48061 Spinal stenosis, lumbar region without neurogenic claudication: Secondary | ICD-10-CM

## 2014-11-15 ENCOUNTER — Ambulatory Visit
Admission: RE | Admit: 2014-11-15 | Discharge: 2014-11-15 | Disposition: A | Discharge status: Home / Self Care | Payer: Medicare Other | Source: Ambulatory Visit | Attending: Neurosurgery | Admitting: Neurosurgery

## 2014-11-15 DIAGNOSIS — M545 Low back pain, unspecified: Secondary | ICD-10-CM

## 2014-11-15 DIAGNOSIS — G8929 Other chronic pain: Secondary | ICD-10-CM

## 2014-11-15 DIAGNOSIS — M48061 Spinal stenosis, lumbar region without neurogenic claudication: Secondary | ICD-10-CM

## 2014-11-15 DIAGNOSIS — M549 Dorsalgia, unspecified: Secondary | ICD-10-CM

## 2014-11-15 MED ORDER — IOHEXOL 180 MG/ML  SOLN
20.0000 mL | Freq: Once | INTRAMUSCULAR | Status: AC | PRN
  Administered 2014-11-15: 20 mL via INTRATHECAL

## 2014-11-15 MED ORDER — DIAZEPAM 5 MG PO TABS
10.0000 mg | ORAL_TABLET | Freq: Once | ORAL | Status: AC
  Administered 2014-11-15: 10 mg via ORAL

## 2014-11-15 MED ORDER — ONDANSETRON HCL 4 MG/2ML IJ SOLN
4.0000 mg | Freq: Once | INTRAMUSCULAR | Status: AC
  Administered 2014-11-15: 4 mg via INTRAMUSCULAR

## 2014-11-15 MED ORDER — HYDROMORPHONE HCL 1 MG/ML IJ SOLN
1.0000 mg | Freq: Once | INTRAMUSCULAR | Status: AC
  Administered 2014-11-15: 1 mg via INTRAMUSCULAR

## 2014-11-15 NOTE — Discharge Instructions (Signed)

## 2015-12-21 ENCOUNTER — Other Ambulatory Visit (HOSPITAL_COMMUNITY): Payer: Self-pay | Admitting: Family Medicine

## 2015-12-21 DIAGNOSIS — M545 Low back pain, unspecified: Secondary | ICD-10-CM

## 2015-12-21 DIAGNOSIS — M79604 Pain in right leg: Secondary | ICD-10-CM

## 2015-12-27 ENCOUNTER — Ambulatory Visit (HOSPITAL_COMMUNITY): Admission: RE | Admit: 2015-12-27 | Payer: Medicare Other | Source: Ambulatory Visit

## 2015-12-29 ENCOUNTER — Ambulatory Visit (HOSPITAL_COMMUNITY)
Admission: RE | Admit: 2015-12-29 | Discharge: 2015-12-29 | Disposition: A | Discharge status: Home / Self Care | Payer: Medicare Other | Source: Ambulatory Visit | Attending: Family Medicine | Admitting: Family Medicine

## 2015-12-29 DIAGNOSIS — M899 Disorder of bone, unspecified: Secondary | ICD-10-CM | POA: Diagnosis not present

## 2015-12-29 DIAGNOSIS — M545 Low back pain, unspecified: Secondary | ICD-10-CM

## 2015-12-29 DIAGNOSIS — N9489 Other specified conditions associated with female genital organs and menstrual cycle: Secondary | ICD-10-CM | POA: Diagnosis not present

## 2015-12-29 DIAGNOSIS — M4686 Other specified inflammatory spondylopathies, lumbar region: Secondary | ICD-10-CM | POA: Diagnosis not present

## 2015-12-29 DIAGNOSIS — M79604 Pain in right leg: Secondary | ICD-10-CM

## 2015-12-29 DIAGNOSIS — M4806 Spinal stenosis, lumbar region: Secondary | ICD-10-CM | POA: Insufficient documentation

## 2015-12-29 DIAGNOSIS — N959 Unspecified menopausal and perimenopausal disorder: Secondary | ICD-10-CM | POA: Diagnosis not present

## 2015-12-29 DIAGNOSIS — M5417 Radiculopathy, lumbosacral region: Secondary | ICD-10-CM | POA: Diagnosis present

## 2016-04-21 ENCOUNTER — Emergency Department (HOSPITAL_COMMUNITY)
Admission: EM | Admit: 2016-04-21 | Discharge: 2016-04-22 | Disposition: A | Discharge status: Home / Self Care | Payer: Medicare Other | Attending: Emergency Medicine | Admitting: Emergency Medicine

## 2016-04-21 ENCOUNTER — Encounter (HOSPITAL_COMMUNITY): Payer: Self-pay | Admitting: *Deleted

## 2016-04-21 DIAGNOSIS — Z79899 Other long term (current) drug therapy: Secondary | ICD-10-CM | POA: Diagnosis not present

## 2016-04-21 DIAGNOSIS — Z8673 Personal history of transient ischemic attack (TIA), and cerebral infarction without residual deficits: Secondary | ICD-10-CM | POA: Insufficient documentation

## 2016-04-21 DIAGNOSIS — F17200 Nicotine dependence, unspecified, uncomplicated: Secondary | ICD-10-CM | POA: Insufficient documentation

## 2016-04-21 DIAGNOSIS — F32A Depression, unspecified: Secondary | ICD-10-CM

## 2016-04-21 DIAGNOSIS — F329 Major depressive disorder, single episode, unspecified: Secondary | ICD-10-CM | POA: Insufficient documentation

## 2016-04-21 DIAGNOSIS — I10 Essential (primary) hypertension: Secondary | ICD-10-CM | POA: Diagnosis not present

## 2016-04-21 HISTORY — DX: Depression, unspecified: F32.A

## 2016-04-21 HISTORY — DX: Major depressive disorder, single episode, unspecified: F32.9
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
## 2016-04-21 NOTE — Discharge Instructions (Addendum)
Please take your blood pressure medication when you get home! Your blood pressure is too high and you will need to follow up as soon as possible with your primary care doctor. You have signs of possible anxiety and/or depression. This is a very common problem.  Be sure to call your caregiver and arrange for follow-up care as suggested by our staff. RETURN IMMEDIATELY IF DEVELOP threat to harm self or others, suicidal or homicidal thoughts, hallucinations or confusion, unable to be cared for at home or uncontrolled behavior, or other concerns.

## 2016-04-21 NOTE — ED Notes (Signed)
Spouse in room states pt in bathroom- all bathrooms are checked- pt is not in department bathrooms. Spouse informed and is ambulatory to waiting room. He is advised to ask pt to return should she need her discharge instructions

## 2016-04-21 NOTE — ED Notes (Signed)
Pt and husband arguing back and forth.
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
## 2016-04-21 NOTE — ED Notes (Signed)
t declines repeat BP- She reports that she takes HTN meds and has not taken tonight
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
## 2016-04-21 NOTE — ED Notes (Signed)
Pt reports no previous counseling, psych admissions, or couples counseling.

## 2016-04-21 NOTE — ED Notes (Signed)
Pa student reports that pt believes that she has overreacted and the plan is to send her home with out pt referrals-
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
## 2016-05-30 ENCOUNTER — Other Ambulatory Visit: Payer: Self-pay | Admitting: Orthopedic Surgery

## 2016-06-11 NOTE — Pre-Procedure Instructions (Signed)
Morning Joyce [name]  06/11/2016      [id number] [address], [place] [id number] [place] [id number] Phone: [phone] Fax: [phone]  [place], [name] - [address] [name] [address] [name] [address] [name] [address] Phone: [phone] Fax: [phone]    Your procedure is scheduled on Nov 15  Report to [facility] at 630 A.M.  Call this number if you have problems the morning of surgery:  [phone]   Remember:  Do not eat food or drink liquids after midnight.  Take these medicines the morning of surgery with A SIP OF WATER Tylenol if needed, Oxycodone (Percocet) if needed,  Stop taking aspirin, BC's, Goody's, Herbal medications, Fish oil, Naproxen (Anaprox), aleve, Ibuprofen, Advil, Motrin   Do not wear jewelry, make-up or nail polish.  Do not wear lotions, powders, or perfumes, or deoderant.  Do not shave 48 hours prior to surgery.  Men may shave face and neck.  Do not bring valuables to the hospital.  [facility] is not responsible for any belongings or valuables.  Contacts, dentures or bridgework may not be worn into surgery.  Leave your suitcase in the car.  After surgery it may be brought to your room.  For patients admitted to the hospital, discharge time will be determined by your treatment team.  Patients discharged the day of surgery will not be allowed to drive home.   Special instructions:  [facility] - Preparing for Surgery  Before surgery, you can play an important role.  Because skin is not sterile, your skin needs to be as free of germs as possible.  You can reduce the number of germs on you skin by washing with CHG (chlorahexidine gluconate) soap before surgery.  CHG is an antiseptic cleaner which kills germs and bonds with the skin to continue killing germs even after washing.  Please DO NOT use if you have an allergy to CHG or antibacterial soaps.  If your skin  becomes reddened/irritated stop using the CHG and inform your nurse when you arrive at Short Stay.  Do not shave (including legs and underarms) for at least 48 hours prior to the first CHG shower.  You may shave your face.  Please follow these instructions carefully:   1.  Shower with CHG Soap the night before surgery and the   morning of Surgery.  2.  If you choose to wash your hair, wash your hair first as usual with your   normal shampoo.  3.  After you shampoo, rinse your hair and body thoroughly to remove the   Shampoo.  4.  Use CHG as you would any other liquid soap.  You can apply chg directly  to the skin and wash gently with scrungie or a clean washcloth.  5.  Apply the CHG Soap to your body ONLY FROM THE NECK DOWN.    Do not use on open wounds or open sores.  Avoid contact with your eyes,   ears, mouth and genitals (private parts).  Wash genitals (private parts) with your normal soap.  6.  Wash thoroughly, paying special attention to the area where your surgery  will be performed.  7.  Thoroughly rinse your body with warm water from the neck down.  8.  DO NOT shower/wash with your normal soap after using and rinsing off the CHG Soap.  9.  Pat yourself dry with a clean towel.  10.  Wear clean pajamas.            11.  Place clean sheets on your bed the night of your first shower and do not sleep with pets.  Day of Surgery  Do not apply any lotions/deoderants the morning of surgery.  Please wear clean clothes to the hospital/surgery center.     Please read over the following fact sheets that you were given. Pain Booklet, Coughing and Deep Breathing, MRSA Information and Surgical Site Infection Prevention, Incentive Spirometry

## 2016-06-12 ENCOUNTER — Inpatient Hospital Stay (HOSPITAL_COMMUNITY)
Admission: RE | Admit: 2016-06-12 | Discharge: 2016-06-12 | Disposition: A | Discharge status: Home / Self Care | Payer: Medicare Other | Source: Ambulatory Visit

## 2016-06-12 NOTE — Progress Notes (Signed)
Not here for her PAT appointment. Called her and she states she is having transportation issues and will not be able to make it today.

## 2016-06-17 ENCOUNTER — Encounter (HOSPITAL_COMMUNITY): Payer: Self-pay | Admitting: *Deleted

## 2016-06-17 NOTE — Progress Notes (Signed)
Mrs Felland reports that she had labs and EKG and medical clearance - completed at her PCP's office Dr Barbette Or' s office.  I have requested reports.

## 2016-06-18 ENCOUNTER — Inpatient Hospital Stay (HOSPITAL_COMMUNITY): Payer: Medicare Other | Admitting: Anesthesiology

## 2016-06-18 NOTE — Anesthesia Preprocedure Evaluation (Deleted)
Anesthesia Evaluation  Patient identified by MRN, date of birth, ID band Patient awake    Reviewed: Allergy & Precautions, H&P , Patient's Chart, lab work & pertinent test results, reviewed documented beta blocker date and time   Airway Mallampati: II  TM Distance: >3 FB Neck ROM: full    Dental no notable dental hx.    Pulmonary Current Smoker,    Pulmonary exam normal breath sounds clear to auscultation       Cardiovascular hypertension,  Rhythm:regular Rate:Normal     Neuro/Psych    GI/Hepatic   Endo/Other    Renal/GU      Musculoskeletal   Abdominal   Peds  Hematology   Anesthesia Other Findings   Reproductive/Obstetrics                             Anesthesia Physical Anesthesia Plan  ASA: II  Anesthesia Plan: General   Post-op Pain Management:    Induction: Intravenous  Airway Management Planned: Oral ETT  Additional Equipment:   Intra-op Plan:   Post-operative Plan: Extubation in OR  Informed Consent: I have reviewed the patients History and Physical, chart, labs and discussed the procedure including the risks, benefits and alternatives for the proposed anesthesia with the patient or authorized representative who has indicated his/her understanding and acceptance.   Dental Advisory Given and Dental advisory given  Plan Discussed with: CRNA and Surgeon  Anesthesia Plan Comments: (Pt has SBP of 261, and 295. 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 No CAD sx. She needs to have an evaluation for her BP before she leaves. Will send to ER  )      Anesthesia Quick Evaluation

## 2016-06-19 ENCOUNTER — Encounter (HOSPITAL_COMMUNITY)
Admission: RE | Disposition: A | Discharge status: Home / Self Care | Payer: Self-pay | Source: Ambulatory Visit | Attending: Emergency Medicine

## 2016-06-19 ENCOUNTER — Observation Stay (HOSPITAL_COMMUNITY)
Admission: RE | Admit: 2016-06-19 | Discharge: 2016-06-20 | Disposition: A | Discharge status: Home / Self Care | Payer: Medicare Other | Source: Ambulatory Visit | Attending: Student in an Organized Health Care Education/Training Program | Admitting: Student in an Organized Health Care Education/Training Program

## 2016-06-19 ENCOUNTER — Emergency Department (HOSPITAL_COMMUNITY): Payer: Medicare Other

## 2016-06-19 ENCOUNTER — Other Ambulatory Visit: Payer: Self-pay

## 2016-06-19 ENCOUNTER — Encounter (HOSPITAL_COMMUNITY): Payer: Self-pay | Admitting: Certified Registered Nurse Anesthetist

## 2016-06-19 ENCOUNTER — Inpatient Hospital Stay (HOSPITAL_COMMUNITY): Payer: Medicare Other

## 2016-06-19 DIAGNOSIS — M1990 Unspecified osteoarthritis, unspecified site: Secondary | ICD-10-CM | POA: Insufficient documentation

## 2016-06-19 DIAGNOSIS — N179 Acute kidney failure, unspecified: Secondary | ICD-10-CM | POA: Diagnosis not present

## 2016-06-19 DIAGNOSIS — E785 Hyperlipidemia, unspecified: Secondary | ICD-10-CM | POA: Diagnosis not present

## 2016-06-19 DIAGNOSIS — M545 Low back pain: Secondary | ICD-10-CM

## 2016-06-19 DIAGNOSIS — Z8673 Personal history of transient ischemic attack (TIA), and cerebral infarction without residual deficits: Secondary | ICD-10-CM | POA: Diagnosis not present

## 2016-06-19 DIAGNOSIS — I161 Hypertensive emergency: Secondary | ICD-10-CM | POA: Diagnosis present

## 2016-06-19 DIAGNOSIS — Z79899 Other long term (current) drug therapy: Secondary | ICD-10-CM

## 2016-06-19 DIAGNOSIS — Z96651 Presence of right artificial knee joint: Secondary | ICD-10-CM

## 2016-06-19 DIAGNOSIS — M549 Dorsalgia, unspecified: Secondary | ICD-10-CM | POA: Diagnosis not present

## 2016-06-19 DIAGNOSIS — Z538 Procedure and treatment not carried out for other reasons: Secondary | ICD-10-CM | POA: Insufficient documentation

## 2016-06-19 DIAGNOSIS — F17210 Nicotine dependence, cigarettes, uncomplicated: Secondary | ICD-10-CM

## 2016-06-19 DIAGNOSIS — N189 Chronic kidney disease, unspecified: Secondary | ICD-10-CM

## 2016-06-19 DIAGNOSIS — G8929 Other chronic pain: Secondary | ICD-10-CM | POA: Diagnosis present

## 2016-06-19 DIAGNOSIS — Z886 Allergy status to analgesic agent status: Secondary | ICD-10-CM

## 2016-06-19 DIAGNOSIS — Z90710 Acquired absence of both cervix and uterus: Secondary | ICD-10-CM | POA: Diagnosis not present

## 2016-06-19 DIAGNOSIS — I129 Hypertensive chronic kidney disease with stage 1 through stage 4 chronic kidney disease, or unspecified chronic kidney disease: Secondary | ICD-10-CM | POA: Diagnosis not present

## 2016-06-19 DIAGNOSIS — Z8249 Family history of ischemic heart disease and other diseases of the circulatory system: Secondary | ICD-10-CM

## 2016-06-19 DIAGNOSIS — Z9049 Acquired absence of other specified parts of digestive tract: Secondary | ICD-10-CM

## 2016-06-19 DIAGNOSIS — Z01818 Encounter for other preprocedural examination: Secondary | ICD-10-CM

## 2016-06-19 DIAGNOSIS — Z981 Arthrodesis status: Secondary | ICD-10-CM

## 2016-06-19 HISTORY — DX: Unspecified osteoarthritis, unspecified site: M19.90

## 2016-06-19 HISTORY — DX: Bronchitis, not specified as acute or chronic: J40

## 2016-06-19 LAB — URINALYSIS, ROUTINE W REFLEX MICROSCOPIC
Bilirubin Urine: NEGATIVE
Glucose, UA: NEGATIVE mg/dL
Hgb urine dipstick: NEGATIVE
KETONES UR: NEGATIVE mg/dL
LEUKOCYTES UA: NEGATIVE
NITRITE: NEGATIVE
PROTEIN: NEGATIVE mg/dL
Specific Gravity, Urine: 1.019 (ref 1.005–1.030)
pH: 6.5 (ref 5.0–8.0)

## 2016-06-19 LAB — TYPE AND SCREEN
ABO/RH(D): O POS
ANTIBODY SCREEN: NEGATIVE

## 2016-06-19 LAB — CBC WITH DIFFERENTIAL/PLATELET
BASOS ABS: 0 10*3/uL (ref 0.0–0.1)
BASOS PCT: 0 %
EOS PCT: 4 %
Eosinophils Absolute: 0.2 10*3/uL (ref 0.0–0.7)
HEMATOCRIT: 31.8 % — AB (ref 36.0–46.0)
Hemoglobin: 10.4 g/dL — ABNORMAL LOW (ref 12.0–15.0)
LYMPHS PCT: 31 %
Lymphs Abs: 1.9 10*3/uL (ref 0.7–4.0)
MCH: 26.3 pg (ref 26.0–34.0)
MCHC: 32.7 g/dL (ref 30.0–36.0)
MCV: 80.3 fL (ref 78.0–100.0)
Monocytes Absolute: 0.6 10*3/uL (ref 0.1–1.0)
Monocytes Relative: 10 %
NEUTROS ABS: 3.4 10*3/uL (ref 1.7–7.7)
Neutrophils Relative %: 55 %
PLATELETS: 247 10*3/uL (ref 150–400)
RBC: 3.96 MIL/uL (ref 3.87–5.11)
RDW: 13.9 % (ref 11.5–15.5)
WBC: 6.1 10*3/uL (ref 4.0–10.5)

## 2016-06-19 LAB — COMPREHENSIVE METABOLIC PANEL
ALBUMIN: 3.2 g/dL — AB (ref 3.5–5.0)
ALT: 11 U/L — AB (ref 14–54)
AST: 20 U/L (ref 15–41)
Alkaline Phosphatase: 78 U/L (ref 38–126)
Anion gap: 10 (ref 5–15)
BUN: 22 mg/dL — AB (ref 6–20)
CHLORIDE: 106 mmol/L (ref 101–111)
CO2: 25 mmol/L (ref 22–32)
CREATININE: 1.44 mg/dL — AB (ref 0.44–1.00)
Calcium: 8.7 mg/dL — ABNORMAL LOW (ref 8.9–10.3)
GFR calc Af Amer: 45 mL/min — ABNORMAL LOW (ref >60)
GFR, EST NON AFRICAN AMERICAN: 39 mL/min — AB (ref >60)
GLUCOSE: 103 mg/dL — AB (ref 65–99)
Potassium: 4.3 mmol/L (ref 3.5–5.1)
Sodium: 141 mmol/L (ref 135–145)
Total Bilirubin: 0.4 mg/dL (ref 0.3–1.2)
Total Protein: 6.2 g/dL — ABNORMAL LOW (ref 6.5–8.1)

## 2016-06-19 LAB — SURGICAL PCR SCREEN
MRSA, PCR: NEGATIVE
Staphylococcus aureus: NEGATIVE

## 2016-06-19 LAB — PROTIME-INR
INR: 1.03
PROTHROMBIN TIME: 13.5 s (ref 11.4–15.2)

## 2016-06-19 LAB — APTT: APTT: 42 s — AB (ref 24–36)

## 2016-06-19 LAB — ABO/RH: ABO/RH(D): O POS

## 2016-06-19 SURGERY — ANTERIOR LATERAL LUMBAR FUSION 1 LEVEL
Anesthesia: General | Laterality: Left

## 2016-06-19 MED ORDER — LIDOCAINE 2% (20 MG/ML) 5 ML SYRINGE
INTRAMUSCULAR | Status: AC
  Filled 2016-06-19: qty 5

## 2016-06-19 MED ORDER — ARTIFICIAL TEARS OP OINT
TOPICAL_OINTMENT | OPHTHALMIC | Status: AC
  Filled 2016-06-19: qty 3.5

## 2016-06-19 MED ORDER — ONDANSETRON HCL 4 MG/2ML IJ SOLN
INTRAMUSCULAR | Status: AC
  Filled 2016-06-19: qty 2

## 2016-06-19 MED ORDER — ATORVASTATIN CALCIUM 10 MG PO TABS
20.0000 mg | ORAL_TABLET | Freq: Every day | ORAL | Status: DC
  Administered 2016-06-19: 20 mg via ORAL
  Filled 2016-06-19: qty 2

## 2016-06-19 MED ORDER — LISINOPRIL 20 MG PO TABS
20.0000 mg | ORAL_TABLET | Freq: Once | ORAL | Status: AC
  Administered 2016-06-19: 20 mg via ORAL
  Filled 2016-06-19: qty 1

## 2016-06-19 MED ORDER — FENTANYL CITRATE (PF) 100 MCG/2ML IJ SOLN
INTRAMUSCULAR | Status: AC
  Filled 2016-06-19: qty 4

## 2016-06-19 MED ORDER — AMLODIPINE BESYLATE 5 MG PO TABS
10.0000 mg | ORAL_TABLET | Freq: Once | ORAL | Status: AC
  Administered 2016-06-19: 10 mg via ORAL
  Filled 2016-06-19: qty 2

## 2016-06-19 MED ORDER — HYDROCHLOROTHIAZIDE 12.5 MG PO CAPS
12.5000 mg | ORAL_CAPSULE | Freq: Every day | ORAL | Status: DC
  Administered 2016-06-19 – 2016-06-20 (×2): 12.5 mg via ORAL
  Filled 2016-06-19 (×2): qty 1

## 2016-06-19 MED ORDER — ACETAMINOPHEN 500 MG PO TABS: 1000.0000 mg | ORAL_TABLET | Freq: Four times a day (QID) | ORAL | Status: DC | PRN

## 2016-06-19 MED ORDER — LISINOPRIL 20 MG PO TABS
20.0000 mg | ORAL_TABLET | Freq: Every day | ORAL | Status: DC
  Administered 2016-06-19 – 2016-06-20 (×2): 20 mg via ORAL
  Filled 2016-06-19 (×2): qty 1

## 2016-06-19 MED ORDER — LISINOPRIL-HYDROCHLOROTHIAZIDE 20-12.5 MG PO TABS: 1.0000 | ORAL_TABLET | Freq: Every day | ORAL | Status: DC

## 2016-06-19 MED ORDER — OXYCODONE-ACETAMINOPHEN 10-325 MG PO TABS: 1.0000 | ORAL_TABLET | ORAL | Status: DC | PRN

## 2016-06-19 MED ORDER — PROPOFOL 1000 MG/100ML IV EMUL
INTRAVENOUS | Status: AC
  Filled 2016-06-19: qty 200

## 2016-06-19 MED ORDER — PROPOFOL 10 MG/ML IV BOLUS
INTRAVENOUS | Status: AC
  Filled 2016-06-19: qty 20

## 2016-06-19 MED ORDER — OXYCODONE-ACETAMINOPHEN 5-325 MG PO TABS
1.0000 | ORAL_TABLET | ORAL | Status: DC | PRN
  Administered 2016-06-19 – 2016-06-20 (×2): 1 via ORAL
  Filled 2016-06-19 (×2): qty 1

## 2016-06-19 MED ORDER — MIDAZOLAM HCL 2 MG/2ML IJ SOLN
INTRAMUSCULAR | Status: AC
  Filled 2016-06-19: qty 2

## 2016-06-19 MED ORDER — CEFAZOLIN SODIUM-DEXTROSE 2-4 GM/100ML-% IV SOLN
INTRAVENOUS | Status: AC
Start: 2016-06-19 — End: 2016-06-19
  Filled 2016-06-19: qty 100

## 2016-06-19 MED ORDER — ACETAMINOPHEN 160 MG/5ML PO SOLN
960.0000 mg | Freq: Once | ORAL | Status: DC
  Filled 2016-06-19: qty 30

## 2016-06-19 MED ORDER — GLYCOPYRROLATE 0.2 MG/ML IV SOSY
PREFILLED_SYRINGE | INTRAVENOUS | Status: AC
  Filled 2016-06-19: qty 3

## 2016-06-19 MED ORDER — OXYCODONE-ACETAMINOPHEN 5-325 MG PO TABS
2.0000 | ORAL_TABLET | Freq: Once | ORAL | Status: AC
  Administered 2016-06-19: 2 via ORAL
  Filled 2016-06-19: qty 2

## 2016-06-19 MED ORDER — NICARDIPINE HCL IN NACL 20-0.86 MG/200ML-% IV SOLN
3.0000 mg/h | Freq: Once | INTRAVENOUS | Status: AC
  Administered 2016-06-19: 5 mg/h via INTRAVENOUS
  Filled 2016-06-19: qty 200

## 2016-06-19 MED ORDER — HYDROCHLOROTHIAZIDE 12.5 MG PO CAPS
12.5000 mg | ORAL_CAPSULE | Freq: Every day | ORAL | Status: DC
  Administered 2016-06-20: 12.5 mg via ORAL
  Filled 2016-06-19: qty 1

## 2016-06-19 MED ORDER — OXYCODONE HCL 5 MG PO TABS
5.0000 mg | ORAL_TABLET | ORAL | Status: DC | PRN
  Administered 2016-06-19 – 2016-06-20 (×2): 5 mg via ORAL
  Filled 2016-06-19 (×2): qty 1

## 2016-06-19 MED ORDER — ENOXAPARIN SODIUM 40 MG/0.4ML ~~LOC~~ SOLN: 40.0000 mg | SUBCUTANEOUS | Status: DC

## 2016-06-19 MED ORDER — ROCURONIUM BROMIDE 10 MG/ML (PF) SYRINGE
PREFILLED_SYRINGE | INTRAVENOUS | Status: AC
  Filled 2016-06-19: qty 10

## 2016-06-19 MED ORDER — SUGAMMADEX SODIUM 200 MG/2ML IV SOLN
INTRAVENOUS | Status: AC
  Filled 2016-06-19: qty 2

## 2016-06-19 MED ORDER — AMLODIPINE BESYLATE 5 MG PO TABS
5.0000 mg | ORAL_TABLET | Freq: Every day | ORAL | Status: DC
Start: 2016-06-19 — End: 2016-06-20
  Administered 2016-06-19 – 2016-06-20 (×2): 5 mg via ORAL
  Filled 2016-06-19 (×2): qty 1

## 2016-06-19 MED ORDER — SIMVASTATIN 40 MG PO TABS: 40.0000 mg | ORAL_TABLET | Freq: Every day | ORAL | Status: DC

## 2016-06-19 MED ORDER — DIPHENHYDRAMINE HCL 25 MG PO CAPS
25.0000 mg | ORAL_CAPSULE | Freq: Once | ORAL | Status: AC
  Administered 2016-06-19: 25 mg via ORAL
  Filled 2016-06-19: qty 1

## 2016-06-19 NOTE — ED Notes (Signed)
Pt ambulated to bathroom independently with cane.  Pt c/o increased back pain (chronic).  Gait was steady, but slow.

## 2016-06-19 NOTE — ED Triage Notes (Addendum)
Per short stay RN pt did not take BP medication as instructed and had elevated BP this morning when procedure was scheduled.  Anesthesiologist stated procedure could not be done with BP elevated around [id number] systolic.  Patient denies blurred vision, chest pain. Patient reports slight headache rating this a 4/10.

## 2016-06-19 NOTE — ED Notes (Signed)
Paged Pricilla Holm, PA to [phone]

## 2016-06-19 NOTE — ED Notes (Signed)
Spoke to admitting about pt's BP.  No interventions but observation with pain medication at this time.  Will follow up q30

## 2016-06-19 NOTE — ED Notes (Signed)
Per short stay RN patient was to have procedural back surgery and takes norvasc and lisinopril but did not take this morning.  BP prior to surgery 237/86 and 216/85.  Dr. Glennon Mac stated he was not going to operate on this and sent to ER.

## 2016-06-19 NOTE — ED Notes (Signed)
Spoke to Admitting about orders.  Still working on them.  Confirmed pt can eat a heart healthy tray.  Will request secretary to order

## 2016-06-19 NOTE — ED Notes (Signed)
Admitting at bedside 

## 2016-06-19 NOTE — Progress Notes (Addendum)
Patient's blood pressure is 237/86.  Repeat vitals remain high.  Dr. Glennon Mac has decided to not proceed with surgery.  Dr. Lynann Bologna will be made aware.   Report given to Eugene Garnet, RN in ER.
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
## 2016-06-19 NOTE — ED Notes (Signed)
Dinner tray delivered.

## 2016-06-19 NOTE — ED Notes (Signed)
Kelly PA at bedside   

## 2016-06-19 NOTE — ED Notes (Signed)
Dinner tray ordered; heart healthy diet 

## 2016-06-19 NOTE — ED Notes (Signed)
Re-paged Pricilla Holm, PA to [phone]

## 2016-06-19 NOTE — H&P (Addendum)
  Date: 06/19/2016  Patient name: Joyce [name]  Medical record number: [id number]  Date of birth: 1956/02/05   I have seen and evaluated Joyce [name] and discussed their care with the Residency Team.  60 yo F with hx of HTN, hyperlipidemia and prev lumbar fusion (2002 L4- S1).  Pt was planned for L3-4 fusion today and upon presentation was found to be hypertensive. She had been told prior to hold her anti-htn rx this am.  Her only associated sx is headaches, she also has sinus congestion.   PMHx, Fam Hx, and/or Soc Hx : Hyperlipidemia, HTN. Lumbar fusion. R TKR, cholecystectomy, appendectomy, partial hysterectomy.   rare ETOH, 1/4 to 1/2 ppd. No recreational drugs.  All- asa- hives, ivory soap.  FHX- HTN (mother, sisters, borther), lipids (mother).   ROS- no SOB, no CP, no n/v, no vision change, no dysuria, normal BM, +/- appetitie.   Vitals:   06/19/16 1430 06/19/16 1600  BP: 158/66 189/86  Pulse: 65 65  Resp: 19 17  Temp:     Eyes- EOMI, PERRL Mouth- mild pharyngeal erythema. Neck- non-tender, no LAN Chest- CTA CV- RRR Abd- bs+, soft, non-tender.  EXtr- no edema.   Lab- Cr 1.44 (prev 1.18 12-2012; 1.20 06-2011)    Assessment and Plan: I have seen and evaluated the patient as outlined above. I agree with the formulated Assessment and Plan as detailed in the residents' note, with the following changes:   1. HTN Will resume her home rx There is not clear evidence for holding ACE-I, there is a suggestion for holding HCTZ, and there appears to be no benefit to holding calcium channel blockers. Beta-blockers appear to be of benefit perioperatively.  Will f/u her Cr (has worsened over her previous).  She will f/u with her pcp  2. Back Pain She will need to reschedule her procedures with her PCP, apparently these cannot be resched this admission.   Campbell Riches, MD 11/15/20175:14 PM

## 2016-06-19 NOTE — ED Notes (Signed)
ED PA made aware nicardipine infusion complete

## 2016-06-19 NOTE — ED Notes (Signed)
Patient given sandwich and apple sauce.  ED PA aware.

## 2016-06-19 NOTE — ED Notes (Signed)
Triage note:  Per short stay RN patient was to have procedural back surgery and takes norvasc and lisinopril but did not take this morning.  BP prior to surgery 237/86 and 216/85.  Dr. Glennon Mac stated he was not going to operate on this and sent to ER.   Assumed care of pt at 1500. Pt was c/o of increasingly worsening back pain and expressed concern about increasing BP. Pain medication, per [facility], was given at 18:26, which decreased her pain and BP. Pt was able to eat her meal without complication or complaint. Pt did not state any further concerns prior to transporting to floor. Last BP was 216/85, HR 67. Call mike @[phone] with any questions.

## 2016-06-19 NOTE — ED Notes (Signed)
Patient to CT.
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
## 2016-06-20 ENCOUNTER — Inpatient Hospital Stay (HOSPITAL_COMMUNITY): Admission: RE | Admit: 2016-06-20 | Payer: Medicare Other | Source: Ambulatory Visit | Admitting: Orthopedic Surgery

## 2016-06-20 ENCOUNTER — Encounter (HOSPITAL_COMMUNITY): Admission: RE | Payer: Self-pay | Source: Ambulatory Visit

## 2016-06-20 DIAGNOSIS — N179 Acute kidney failure, unspecified: Secondary | ICD-10-CM | POA: Diagnosis not present

## 2016-06-20 DIAGNOSIS — M549 Dorsalgia, unspecified: Secondary | ICD-10-CM | POA: Diagnosis not present

## 2016-06-20 DIAGNOSIS — G8929 Other chronic pain: Secondary | ICD-10-CM

## 2016-06-20 DIAGNOSIS — I161 Hypertensive emergency: Secondary | ICD-10-CM | POA: Diagnosis not present

## 2016-06-20 DIAGNOSIS — R51 Headache: Secondary | ICD-10-CM

## 2016-06-20 LAB — BASIC METABOLIC PANEL
Anion gap: 10 (ref 5–15)
BUN: 15 mg/dL (ref 6–20)
CALCIUM: 9 mg/dL (ref 8.9–10.3)
CHLORIDE: 103 mmol/L (ref 101–111)
CO2: 26 mmol/L (ref 22–32)
Creatinine, Ser: 1.28 mg/dL — ABNORMAL HIGH (ref 0.44–1.00)
GFR calc non Af Amer: 45 mL/min — ABNORMAL LOW (ref >60)
GFR, EST AFRICAN AMERICAN: 52 mL/min — AB (ref >60)
GLUCOSE: 111 mg/dL — AB (ref 65–99)
Potassium: 3.5 mmol/L (ref 3.5–5.1)
Sodium: 139 mmol/L (ref 135–145)

## 2016-06-20 SURGERY — Surgical Case
Anesthesia: *Unknown

## 2016-06-20 SURGERY — POSTERIOR LUMBAR FUSION 1 LEVEL
Anesthesia: General

## 2016-06-20 MED ORDER — AMLODIPINE BESYLATE 5 MG PO TABS: 5.0000 mg | ORAL_TABLET | Freq: Every day | ORAL | 0 refills | Status: DC

## 2016-06-20 NOTE — Care Management Obs Status (Signed)
[facility] NOTIFICATION   Patient Details  Name: ADONIS BLOUGH MRN: [id number] Date of Birth: Oct 04, 1955   Medicare Observation Status Notification Given:  Yes    Pollie Friar, RN 06/20/2016, 11:38 AM

## 2016-06-20 NOTE — Progress Notes (Addendum)
RN discussed discharge instructions with patient including follow up appointment with PCP, discharge medications which MD confirms for BP meds, patient should continue taking amlodipine and lisinopril hctz. No metoprolol. Patient aware she cannot stop taking BP meds unless directed by MD. Neuro assessment unchanged. IV removed. Will continue to monitor.   MD is aware patient's BP ranging between 160-180s

## 2016-06-20 NOTE — Progress Notes (Signed)
Patient complaining of sore throat, 6/10 headache, congestion, and "5/10" chest pain and tightness. Patient states tightness and pain in chest does not radiate. MD notified of above. Will continue to monitor.

## 2016-06-20 NOTE — Discharge Summary (Signed)
Name: Joyce [name] MRN: [id number] DOB: 1955/11/13 60 y.o. PCP: Lucia Gaskins, MD  Date of Admission: 06/19/2016  5:12 AM Date of Discharge: 06/20/2016 Attending Physician: Axel Filler, MD  Discharge Diagnosis: Active Problems:   Hypertensive emergency   Chronic back pain  Discharge Medications:   Medication List    STOP taking these medications   metoprolol succinate 50 MG 24 hr tablet Commonly known as:  TOPROL-XL   triamterene-hydrochlorothiazide 37.5-25 MG tablet Commonly known as:  MAXZIDE-25     TAKE these medications   acetaminophen 500 MG tablet Commonly known as:  TYLENOL Take 1,000 mg by mouth every 6 (six) hours as needed for mild pain.   amLODipine 5 MG tablet Commonly known as:  NORVASC Take 1 tablet (5 mg total) by mouth daily. Start taking on:  06/21/2016   buPROPion 150 MG 12 hr tablet Commonly known as:  WELLBUTRIN SR Take 1 tablet (150 mg total) by mouth 2 (two) times daily.   DULoxetine 30 MG capsule Commonly known as:  CYMBALTA Take 1 capsule (30 mg total) by mouth daily.   FISH OIL PO Take 1 capsule by mouth daily.   FOLIC ACID PO Take 1 tablet by mouth daily.   gabapentin 300 MG capsule Commonly known as:  NEURONTIN Take 1 capsule (300 mg total) by mouth 3 (three) times daily. 1 tablet in BID and 2 at bedtime   IRON PO Take 1 tablet by mouth daily.   lisinopril-hydrochlorothiazide 20-12.5 MG tablet Commonly known as:  PRINZIDE,ZESTORETIC Take 1 tablet by mouth daily.   methocarbamol 500 MG tablet Commonly known as:  ROBAXIN Take 1 tablet (500 mg total) by mouth 2 (two) times daily.   multivitamin with minerals Tabs tablet Take 1 tablet by mouth daily.   naproxen sodium 220 MG tablet Commonly known as:  ANAPROX Take 440 mg by mouth 2 (two) times daily as needed.   oxyCODONE-acetaminophen 10-325 MG tablet Commonly known as:  PERCOCET Take 1 tablet by mouth every 4 (four) hours as needed for pain.     simvastatin 40 MG tablet Commonly known as:  ZOCOR Take 40 mg by mouth daily.   VITAMIN C PO Take 1 tablet by mouth daily.   VITAMIN E PO Take 1 tablet by mouth daily.       Disposition and follow-up:   Joyce [name] was discharged from [facility] in Good condition.  At the hospital follow up visit please address:  1.  HTN: verify medication compliance, check BP for control on current regimen. Chronic back pain: assess for changes in sx, verify new surgical date has been set.  Follow-up Appointments: Follow-up Information    DONDIEGO,RICHARD M, MD. Schedule an appointment as soon as possible for a visit.   Specialty:  Internal Medicine Why:  As needed. Contact information: [name] [address] [phone]          Hospital Course by problem list: Active Problems:   Chronic back pain   Hypertensive emergency   1. Hypertensive emergency: Patient presented for preoperatively for lumbar spinal fusion and was found to have markedly elevated BP with systolic pressures to A999333. She also c/o headache, but had no other neurologic deficits. She reported having skipped her BP medications the prior day on instructions from the pre-operative team. She was sent to the ED where she was started on a nicardipine gtt. A head CT which was normal was obtained to r/o cerebral complications from  malignant HTN. Her BP began to normalize and her home medications were restarted. Her BP normalized and she was discharged home with her PTA BP meds. Her lumbar fusion will be rescheduled.  2. Chronic back pain: Back pain is unchanged from baseline during hospitalizations. She was treated with her home narcotic pain regime. Her spinal fusion will be rescheduled and she was instructed not to stop her home BP meds prior to this procedure.  Discharge Vitals:   BP (!) 184/67 (BP Location: Right Arm)   Pulse 64   Temp 98.4 F (36.9 C) (Oral)   Resp 17   Ht 5'  7" (1.702 m)   Wt 96.2 kg (212 lb 1.6 oz)   LMP 12/04/1994   SpO2 100%   BMI 33.22 kg/m   Pertinent Labs, Studies, and Procedures: As above.  Procedures Performed:  Dg Chest 2 View  Result Date: 06/19/2016 CLINICAL DATA:  Preoperative lumbar fusion.  Hypertension EXAM: CHEST  2 VIEW COMPARISON:  Dec 03, 2012 FINDINGS: There is no edema or consolidation. The heart size and pulmonary vascularity normal. No adenopathy. There is degenerative change in the thoracic spine. IMPRESSION: No edema or consolidation. Electronically Signed   By: Lowella Grip III M.D.   On: 06/19/2016 07:56   Ct Head Wo Contrast  Result Date: 06/19/2016 CLINICAL DATA:  Hypertensive episode with headache prior to surgery. EXAM: CT HEAD WITHOUT CONTRAST TECHNIQUE: Contiguous axial images were obtained from the base of the skull through the vertex without intravenous contrast. COMPARISON:  None. FINDINGS: Brain: No evidence of an acute infarct, acute hemorrhage, mass lesion, mass effect or hydrocephalus. Remote left cerebellar infarct. Vascular: No hyperdense vessel or unexpected calcification. Skull: Normal. Negative for fracture or focal lesion. Sinuses/Orbits: Mucosal thickening in the paranasal sinuses. No air-fluid levels. Mastoid air cells are clear. Other: None. IMPRESSION: No acute intracranial abnormality. Electronically Signed   By: Lorin Picket M.D.   On: 06/19/2016 15:23   Discharge Instructions: Discharge Instructions    Call MD for:  difficulty breathing, headache or visual disturbances    Complete by:  As directed    Call MD for:  persistant dizziness or light-headedness    Complete by:  As directed    Call MD for:  persistant nausea and vomiting    Complete by:  As directed    Diet - low sodium heart healthy    Complete by:  As directed    Discharge instructions    Complete by:  As directed    Your blood pressure was elevated after you missed your home doses of medication. Please make sure to  take all of your home medications and do not skip these doses even before your upcoming back surgery as you are very sensitive to these medications.  Your back surgery can be rescheduled and you can follow up as needed with your Primary Doctor.   Increase activity slowly    Complete by:  As directed       Signed: Holley Raring, MD 06/20/2016, 2:37 PM   Pager: [phone]

## 2016-06-20 NOTE — Progress Notes (Signed)
Patient's transportation called per patient request. Transport is on way.

## 2016-06-20 NOTE — Progress Notes (Signed)
   Subjective: Currently, the patient feels well. BP better controlled on home meds. Agreeable to DC.  Objective: Vital signs in last 24 hours: Vitals:   06/19/16 1957 06/20/16 0141 06/20/16 0544 06/20/16 1008  BP: (!) 170/67 (!) 168/69 (!) 139/49 (!) 164/78  Pulse: 70 60 66 61  Resp: 18 18 18 16   Temp: 99 F (37.2 C) 98 F (36.7 C) 98.5 F (36.9 C) 98.9 F (37.2 C)  TempSrc: Oral Oral Oral Oral  SpO2: 100% 100% 100% 100%  Weight: 96.2 kg (212 lb 1.6 oz)     Height: 5\' 7"  (1.702 m)      Physical Exam: Physical Exam  Constitutional: She appears well-developed. She is cooperative. No distress.  Cardiovascular: Normal rate, regular rhythm, normal heart sounds and normal pulses.  Exam reveals no gallop.   No murmur heard. Pulmonary/Chest: Effort normal and breath sounds normal. No respiratory distress. She has no wheezes. She has no rhonchi. She has no rales. Breasts are symmetrical.  Abdominal: Soft. Bowel sounds are normal. There is no tenderness.  Musculoskeletal: She exhibits no edema.   Labs: CBC:  Recent Labs Lab 06/19/16 0730  WBC 6.1  NEUTROABS 3.4  HGB 10.4*  HCT 31.8*  MCV 80.3  PLT A999333   Metabolic Panel:  Recent Labs Lab 06/19/16 0730 06/20/16 0501  NA 141 139  K 4.3 3.5  CL 106 103  CO2 25 26  GLUCOSE 103* 111*  BUN 22* 15  CREATININE 1.44* 1.28*  CALCIUM 8.7* 9.0  ALT 11*  --   ALKPHOS 78  --   BILITOT 0.4  --   PROT 6.2*  --   ALBUMIN 3.2*  --   LABPROT 13.5  --   INR 1.03  --     Medications: Scheduled Medications: . amLODipine  5 mg Oral Daily  . atorvastatin  20 mg Oral QHS  . hydrochlorothiazide  12.5 mg Oral Daily  . lisinopril  20 mg Oral Daily   And  . hydrochlorothiazide  12.5 mg Oral Daily   PRN Medications: acetaminophen, oxyCODONE **AND** oxyCODONE-acetaminophen  Assessment/Plan: Pt is a 60 y.o. yo female with a PMHx of low back pain, HTN, HLD who was admitted on 06/19/2016 with hypertensive urgency.  Hypertension.  BP 237/86 on presentation after missing home medications in the setting of upcoming lumbar spinal fusion procedure. Home BP meds were restarted with good control ON to 139/49. Will plan to continue home meds w/o discontinuation prior back surgery - Continue home meds.  Hyperlipidemia. Continue home meds.  Back pain. Reschedule procedure. Do not hold BP meds pre-op.  AKI. Initially elevated to 1.44, downtrending to 1.28. Likely secondary to AKI from acute HTN. UOP good.  Length of Stay: 0 day(s) Dispo: Anticipated discharge today.  Holley Raring, MD Pager: [phone] (7AM-5PM) 06/20/2016, 1:44 PM

## 2016-10-24 ENCOUNTER — Other Ambulatory Visit: Payer: Self-pay | Admitting: Orthopedic Surgery

## 2016-11-12 ENCOUNTER — Encounter (HOSPITAL_COMMUNITY): Payer: Self-pay

## 2016-11-12 ENCOUNTER — Encounter (HOSPITAL_COMMUNITY)
Admission: RE | Admit: 2016-11-12 | Discharge: 2016-11-12 | Disposition: A | Discharge status: Home / Self Care | Payer: Medicare Other | Source: Ambulatory Visit | Attending: Orthopedic Surgery | Admitting: Orthopedic Surgery

## 2016-11-12 DIAGNOSIS — M79604 Pain in right leg: Secondary | ICD-10-CM | POA: Insufficient documentation

## 2016-11-12 DIAGNOSIS — M79605 Pain in left leg: Secondary | ICD-10-CM | POA: Insufficient documentation

## 2016-11-12 DIAGNOSIS — Z01812 Encounter for preprocedural laboratory examination: Secondary | ICD-10-CM | POA: Insufficient documentation

## 2016-11-12 HISTORY — DX: Pneumonia, unspecified organism: J18.9

## 2016-11-12 LAB — COMPREHENSIVE METABOLIC PANEL
ALT: 12 U/L — ABNORMAL LOW (ref 14–54)
ANION GAP: 12 (ref 5–15)
AST: 24 U/L (ref 15–41)
Albumin: 3.4 g/dL — ABNORMAL LOW (ref 3.5–5.0)
Alkaline Phosphatase: 79 U/L (ref 38–126)
BILIRUBIN TOTAL: 0.7 mg/dL (ref 0.3–1.2)
BUN: 20 mg/dL (ref 6–20)
CHLORIDE: 101 mmol/L (ref 101–111)
CO2: 22 mmol/L (ref 22–32)
Calcium: 8.9 mg/dL (ref 8.9–10.3)
Creatinine, Ser: 2.05 mg/dL — ABNORMAL HIGH (ref 0.44–1.00)
GFR calc Af Amer: 29 mL/min — ABNORMAL LOW (ref >60)
GFR calc non Af Amer: 25 mL/min — ABNORMAL LOW (ref >60)
Glucose, Bld: 96 mg/dL (ref 65–99)
POTASSIUM: 3.6 mmol/L (ref 3.5–5.1)
Sodium: 135 mmol/L (ref 135–145)
TOTAL PROTEIN: 7 g/dL (ref 6.5–8.1)

## 2016-11-12 LAB — URINALYSIS, ROUTINE W REFLEX MICROSCOPIC
Bilirubin Urine: NEGATIVE
Glucose, UA: NEGATIVE mg/dL
HGB URINE DIPSTICK: NEGATIVE
Ketones, ur: NEGATIVE mg/dL
Leukocytes, UA: NEGATIVE
NITRITE: NEGATIVE
PH: 6 (ref 5.0–8.0)
PROTEIN: NEGATIVE mg/dL
SPECIFIC GRAVITY, URINE: 1.01 (ref 1.005–1.030)

## 2016-11-12 LAB — CBC WITH DIFFERENTIAL/PLATELET
Basophils Absolute: 0 10*3/uL (ref 0.0–0.1)
Basophils Relative: 0 %
EOS PCT: 5 %
Eosinophils Absolute: 0.4 10*3/uL (ref 0.0–0.7)
HEMATOCRIT: 34.8 % — AB (ref 36.0–46.0)
Hemoglobin: 11.3 g/dL — ABNORMAL LOW (ref 12.0–15.0)
LYMPHS PCT: 41 %
Lymphs Abs: 2.9 10*3/uL (ref 0.7–4.0)
MCH: 25.8 pg — ABNORMAL LOW (ref 26.0–34.0)
MCHC: 32.5 g/dL (ref 30.0–36.0)
MCV: 79.5 fL (ref 78.0–100.0)
MONO ABS: 0.4 10*3/uL (ref 0.1–1.0)
MONOS PCT: 6 %
NEUTROS ABS: 3.3 10*3/uL (ref 1.7–7.7)
Neutrophils Relative %: 48 %
PLATELETS: 250 10*3/uL (ref 150–400)
RBC: 4.38 MIL/uL (ref 3.87–5.11)
RDW: 14.6 % (ref 11.5–15.5)
WBC: 7.1 10*3/uL (ref 4.0–10.5)

## 2016-11-12 LAB — SURGICAL PCR SCREEN
MRSA, PCR: NEGATIVE
Staphylococcus aureus: NEGATIVE

## 2016-11-12 LAB — APTT: APTT: 40 s — AB (ref 24–36)

## 2016-11-12 LAB — PROTIME-INR
INR: 1.02
Prothrombin Time: 13.4 seconds (ref 11.4–15.2)

## 2016-11-12 NOTE — Pre-Procedure Instructions (Signed)
Joyce [name]  11/12/2016      [facility], [place] [address] [place] [address] Phone: [phone] Fax: [phone]  [facility] [address] [name] [address] [name] [address] [name] [address] [name] [address] Phone: [phone] Fax: [phone]    Your procedure is scheduled on Wednesday, April 18.  Report to [facility] Admitting at 6:30 AM               Your surgery or procedure is scheduled for 8:30 AM    Call this number if you have problems the morning of surgery:[phone]               For any other questions, please call [phone], Monday - Friday 8 AM - 4 PM.    Remember:  Do not eat food or drink liquids after midnight Tuesday, April 17.  Take these medicines the morning of surgery with A SIP OF WATER :amLODipine (NORVASC), labetalol (NORMODYNE), simvastatin (ZOCOR).                    Take if needed: oxyCODONE-acetaminophen (PERCOCET).                  1 Week prior to surgery STOP taking Aspirin, Aspirin Products (Goody Powder, Excedrin Migraine), Ibuprofen (Advil), Naproxen (Aleve), Vitamims and Herbal Products (ie Fish Oil)  Special instructions:   [place]- Preparing For Surgery  Before surgery, you can play an important role. Because skin is not sterile, your skin needs to be as free of germs as possible. You can reduce the number of germs on your skin by washing with CHG (chlorahexidine gluconate) Soap before surgery.  CHG is an antiseptic cleaner which kills germs and bonds with the skin to continue killing germs even after washing.  Please do not use if you have an allergy to CHG or antibacterial soaps. If your skin becomes reddened/irritated stop using the CHG.  Do not shave (including legs and underarms) for at least 48 hours prior to first CHG shower. It is OK to shave your face.  Please follow these instructions carefully.   1. Shower the NIGHT BEFORE SURGERY  and the MORNING OF SURGERY with CHG.   2. If you chose to wash your hair, wash your hair first as usual with your normal shampoo.  3. After you shampoo, rinse your hair and body thoroughly to remove the shampoo.  4. Use CHG as you would any other liquid soap. You can apply CHG directly to the skin and wash gently with a scrungie or a clean washcloth.   5. Apply the CHG Soap to your body ONLY FROM THE NECK DOWN.  Do not use on open wounds or open sores. Avoid contact with your eyes, ears, mouth and genitals (private parts). Wash genitals (private parts) with your normal soap.  6. Wash thoroughly, paying special attention to the area where your surgery will be performed.  7. Thoroughly rinse your body with warm water from the neck down.  8. DO NOT shower/wash with your normal soap after using and rinsing off the CHG Soap.  9. Pat yourself dry with a CLEAN TOWEL.   10. Wear CLEAN PAJAMAS   11. Place CLEAN SHEETS on your bed the night of your first shower and DO NOT SLEEP WITH PETS.  Day of Surgery: Do not apply any deodorants/lotions. Please wear clean clothes to the hospital/surgery center.  Do not wear jewelry, make-up or nail polish.  Do not wear lotions, powders, or perfumes, or deodorants.  Do not bring valuables to the hospital.  [facility] is not responsible for any belongings or valuables.  Contacts, dentures or bridgework may not be worn into surgery.  Leave your suitcase in the car.  After surgery it may be brought to your room.  For patients admitted to the hospital, discharge time will be determined by your treatment team.  Please read over the following fact sheets that you were given.  Patient Instructions for Mupirocin Application, Incentive Spirometry, Pain Booklet, Surgical Site Infection

## 2016-11-14 NOTE — Progress Notes (Addendum)
Anesthesia Chart Review:  Pt is a 61 year old female scheduled for left sided L3-4 lateral interbody fusion with allograft and instrumentation on 11/20/2016 and L3-4 posterior fusion on 11/21/16 with Phylliss Bob, MD.   Surgery was originally scheduled for last November 2017 but was postponed due to increased BP.   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 Current smoker. BMI 34.   Medications include: amlodipine, iron, labetolol, lisinopril-HCTZ, simvastatin.  BP (!) 132/56   Pulse 63   Temp 36.6 C   Resp 20   Ht 5\' 7"  (1.702 m)   Wt 215 lb 11.2 oz (97.8 kg)   LMP 12/04/1994   SpO2 99%   BMI 33.78 kg/m    Preoperative labs reviewed.   - Cr 2.05, BUN 20. Prior Cr five months ago was 1.44.  - PT normal, PTT 40  CXR 06/19/16: No edema or consolidation.  EKG 06/19/16: Sinus bradycardia (59 bpm) with sinus arrhythmia. Possible LAE. LVH. Nonspecific T wave abnormality  Willeen Cass, FNP-BC [facility] Short Stay Surgical Center/Anesthesiology Phone: [phone] 11/14/2016 4:58 PM  Addendum:   I reached out to PCP about renal function. Spoke with [place] in Dr. Denita Lung office who spoke with Dr. Cindie Laroche.  He is aware of elevated Cr and monitoring it, felt pt could proceed with surgery.   If no changes, I anticipate pt can proceed with surgery as scheduled.   Willeen Cass, FNP-BC [facility] Short Stay Surgical Center/Anesthesiology Phone: [phone] 11/15/2016 4:30 PM

## 2016-11-19 ENCOUNTER — Encounter (HOSPITAL_COMMUNITY): Payer: Self-pay | Admitting: Anesthesiology

## 2016-11-19 NOTE — Anesthesia Preprocedure Evaluation (Addendum)
Anesthesia Evaluation  Patient identified by MRN, date of birth, ID band Patient awake    Reviewed: Allergy & Precautions, NPO status , Patient's Chart, lab work & pertinent test results  Airway Mallampati: II  TM Distance: >3 FB Neck ROM: Full    Dental  (+) Teeth Intact, Dental Advisory Given   Pulmonary neg pulmonary ROS, Current Smoker,    breath sounds clear to auscultation       Cardiovascular hypertension, Pt. on medications and Pt. on home beta blockers  Rhythm:Regular Rate:Normal     Neuro/Psych PSYCHIATRIC DISORDERS Depression CVA    GI/Hepatic negative GI ROS, Neg liver ROS,   Endo/Other  negative endocrine ROS  Renal/GU negative Renal ROS  negative genitourinary   Musculoskeletal  (+) Arthritis , Osteoarthritis,    Abdominal   Peds negative pediatric ROS (+)  Hematology negative hematology ROS (+)   Anesthesia Other Findings Day of surgery medications reviewed with the patient.  - HLD  Reproductive/Obstetrics negative OB ROS                            Lab Results  Component Value Date   WBC 7.1 11/12/2016   HGB 11.3 (L) 11/12/2016   HCT 34.8 (L) 11/12/2016   MCV 79.5 11/12/2016   PLT 250 11/12/2016   Lab Results  Component Value Date   CREATININE 2.05 (H) 11/12/2016   BUN 20 11/12/2016   NA 135 11/12/2016   K 3.6 11/12/2016   CL 101 11/12/2016   CO2 22 11/12/2016   Lab Results  Component Value Date   INR 1.02 11/12/2016   INR 1.03 06/19/2016   EKG: Sinus bradycardia  Anesthesia Physical Anesthesia Plan  ASA: II  Anesthesia Plan: General   Post-op Pain Management:    Induction: Intravenous  Airway Management Planned: Oral ETT  Additional Equipment:   Intra-op Plan:   Post-operative Plan: Extubation in OR  Informed Consent: I have reviewed the patients History and Physical, chart, labs and discussed the procedure including the risks, benefits and  alternatives for the proposed anesthesia with the patient or authorized representative who has indicated his/her understanding and acceptance.   Dental advisory given  Plan Discussed with: CRNA  Anesthesia Plan Comments:         Anesthesia Quick Evaluation

## 2016-11-20 ENCOUNTER — Inpatient Hospital Stay (HOSPITAL_COMMUNITY): Payer: Medicare Other | Admitting: Anesthesiology

## 2016-11-20 ENCOUNTER — Encounter (HOSPITAL_COMMUNITY): Payer: Self-pay | Admitting: *Deleted

## 2016-11-20 ENCOUNTER — Inpatient Hospital Stay (HOSPITAL_COMMUNITY): Payer: Medicare Other

## 2016-11-20 ENCOUNTER — Inpatient Hospital Stay (HOSPITAL_COMMUNITY): Payer: Medicare Other | Admitting: Emergency Medicine

## 2016-11-20 ENCOUNTER — Inpatient Hospital Stay (HOSPITAL_COMMUNITY)
Admission: RE | Admit: 2016-11-20 | Discharge: 2016-11-23 | DRG: 455 | Disposition: A | Discharge status: Home Health | Payer: Medicare Other | Source: Ambulatory Visit | Attending: Orthopedic Surgery | Admitting: Orthopedic Surgery

## 2016-11-20 ENCOUNTER — Encounter (HOSPITAL_COMMUNITY)
Admission: RE | Disposition: A | Discharge status: Home Health | Payer: Self-pay | Source: Ambulatory Visit | Attending: Orthopedic Surgery

## 2016-11-20 DIAGNOSIS — E785 Hyperlipidemia, unspecified: Secondary | ICD-10-CM | POA: Diagnosis present

## 2016-11-20 DIAGNOSIS — Z96653 Presence of artificial knee joint, bilateral: Secondary | ICD-10-CM | POA: Diagnosis present

## 2016-11-20 DIAGNOSIS — Z91040 Latex allergy status: Secondary | ICD-10-CM | POA: Diagnosis not present

## 2016-11-20 DIAGNOSIS — F17210 Nicotine dependence, cigarettes, uncomplicated: Secondary | ICD-10-CM | POA: Diagnosis present

## 2016-11-20 DIAGNOSIS — I10 Essential (primary) hypertension: Secondary | ICD-10-CM | POA: Diagnosis present

## 2016-11-20 DIAGNOSIS — Z886 Allergy status to analgesic agent status: Secondary | ICD-10-CM

## 2016-11-20 DIAGNOSIS — M1991 Primary osteoarthritis, unspecified site: Secondary | ICD-10-CM | POA: Diagnosis present

## 2016-11-20 DIAGNOSIS — Z90710 Acquired absence of both cervix and uterus: Secondary | ICD-10-CM | POA: Diagnosis not present

## 2016-11-20 DIAGNOSIS — Z8673 Personal history of transient ischemic attack (TIA), and cerebral infarction without residual deficits: Secondary | ICD-10-CM | POA: Diagnosis not present

## 2016-11-20 DIAGNOSIS — Z91048 Other nonmedicinal substance allergy status: Secondary | ICD-10-CM | POA: Diagnosis not present

## 2016-11-20 DIAGNOSIS — M79605 Pain in left leg: Secondary | ICD-10-CM | POA: Diagnosis present

## 2016-11-20 DIAGNOSIS — Z79899 Other long term (current) drug therapy: Secondary | ICD-10-CM | POA: Diagnosis not present

## 2016-11-20 DIAGNOSIS — Z981 Arthrodesis status: Secondary | ICD-10-CM | POA: Diagnosis not present

## 2016-11-20 DIAGNOSIS — M48061 Spinal stenosis, lumbar region without neurogenic claudication: Secondary | ICD-10-CM | POA: Diagnosis present

## 2016-11-20 DIAGNOSIS — M5410 Radiculopathy, site unspecified: Secondary | ICD-10-CM | POA: Diagnosis present

## 2016-11-20 DIAGNOSIS — Z79891 Long term (current) use of opiate analgesic: Secondary | ICD-10-CM | POA: Diagnosis not present

## 2016-11-20 DIAGNOSIS — M79604 Pain in right leg: Secondary | ICD-10-CM | POA: Diagnosis present

## 2016-11-20 DIAGNOSIS — F329 Major depressive disorder, single episode, unspecified: Secondary | ICD-10-CM | POA: Diagnosis present

## 2016-11-20 DIAGNOSIS — Z419 Encounter for procedure for purposes other than remedying health state, unspecified: Secondary | ICD-10-CM

## 2016-11-20 HISTORY — PX: ANTERIOR LAT LUMBAR FUSION: SHX1168

## 2016-11-20 SURGERY — ANTERIOR LATERAL LUMBAR FUSION 1 LEVEL
Anesthesia: General | Laterality: Left

## 2016-11-20 MED ORDER — POVIDONE-IODINE 7.5 % EX SOLN
Freq: Once | CUTANEOUS | Status: DC
  Filled 2016-11-20: qty 118

## 2016-11-20 MED ORDER — OXYMETAZOLINE HCL 0.05 % NA SOLN
2.0000 | Freq: Two times a day (BID) | NASAL | Status: DC | PRN
  Filled 2016-11-20: qty 15

## 2016-11-20 MED ORDER — MEPERIDINE HCL 25 MG/ML IJ SOLN: 6.2500 mg | INTRAMUSCULAR | Status: DC | PRN

## 2016-11-20 MED ORDER — DEXTROSE 5 % IV SOLN
INTRAVENOUS | Status: DC | PRN
  Administered 2016-11-20: 09:00:00 via INTRAVENOUS

## 2016-11-20 MED ORDER — EPHEDRINE 5 MG/ML INJ
INTRAVENOUS | Status: AC
  Filled 2016-11-20: qty 10

## 2016-11-20 MED ORDER — BISACODYL 5 MG PO TBEC: 5.0000 mg | DELAYED_RELEASE_TABLET | Freq: Every day | ORAL | Status: DC | PRN

## 2016-11-20 MED ORDER — LACTATED RINGERS IV SOLN
INTRAVENOUS | Status: DC | PRN
  Administered 2016-11-20 (×2): via INTRAVENOUS

## 2016-11-20 MED ORDER — MIDAZOLAM HCL 2 MG/2ML IJ SOLN
INTRAMUSCULAR | Status: AC
  Filled 2016-11-20: qty 2

## 2016-11-20 MED ORDER — SODIUM CHLORIDE 0.9% FLUSH: 3.0000 mL | INTRAVENOUS | Status: DC | PRN

## 2016-11-20 MED ORDER — PROPOFOL 500 MG/50ML IV EMUL
INTRAVENOUS | Status: DC | PRN
  Administered 2016-11-20: 100 ug/kg/min via INTRAVENOUS

## 2016-11-20 MED ORDER — SUCCINYLCHOLINE CHLORIDE 200 MG/10ML IV SOSY
PREFILLED_SYRINGE | INTRAVENOUS | Status: AC
  Filled 2016-11-20: qty 10

## 2016-11-20 MED ORDER — ONDANSETRON HCL 4 MG PO TABS: 4.0000 mg | ORAL_TABLET | Freq: Four times a day (QID) | ORAL | Status: DC | PRN

## 2016-11-20 MED ORDER — POTASSIUM CHLORIDE IN NACL 20-0.9 MEQ/L-% IV SOLN
INTRAVENOUS | Status: DC
  Administered 2016-11-20: 16:00:00 via INTRAVENOUS
  Filled 2016-11-20: qty 1000

## 2016-11-20 MED ORDER — CEFAZOLIN SODIUM-DEXTROSE 2-4 GM/100ML-% IV SOLN: 2.0000 g | INTRAVENOUS | Status: DC

## 2016-11-20 MED ORDER — LIDOCAINE HCL (CARDIAC) 20 MG/ML IV SOLN
INTRAVENOUS | Status: DC | PRN
Start: 2016-11-20 — End: 2016-11-20
  Administered 2016-11-20: 60 mg via INTRAVENOUS

## 2016-11-20 MED ORDER — GLYCOPYRROLATE 0.2 MG/ML IJ SOLN
INTRAMUSCULAR | Status: DC | PRN
  Administered 2016-11-20: 0.4 mg via INTRAVENOUS

## 2016-11-20 MED ORDER — HYDROMORPHONE HCL 1 MG/ML IJ SOLN
INTRAMUSCULAR | Status: AC
  Filled 2016-11-20: qty 1

## 2016-11-20 MED ORDER — ALUM & MAG HYDROXIDE-SIMETH 200-200-20 MG/5ML PO SUSP: 30.0000 mL | Freq: Four times a day (QID) | ORAL | Status: DC | PRN

## 2016-11-20 MED ORDER — HYDROMORPHONE HCL 1 MG/ML IJ SOLN
0.2500 mg | INTRAMUSCULAR | Status: DC | PRN
  Administered 2016-11-20 (×2): 0.5 mg via INTRAVENOUS

## 2016-11-20 MED ORDER — CEFAZOLIN SODIUM-DEXTROSE 2-4 GM/100ML-% IV SOLN
2.0000 g | Freq: Three times a day (TID) | INTRAVENOUS | Status: AC
  Administered 2016-11-20 – 2016-11-21 (×2): 2 g via INTRAVENOUS
  Filled 2016-11-20 (×2): qty 100

## 2016-11-20 MED ORDER — PHENYLEPHRINE HCL 10 MG/ML IJ SOLN
INTRAVENOUS | Status: DC | PRN
  Administered 2016-11-20: 45 ug/min via INTRAVENOUS

## 2016-11-20 MED ORDER — MIDAZOLAM HCL 5 MG/5ML IJ SOLN
INTRAMUSCULAR | Status: DC | PRN
  Administered 2016-11-20: 2 mg via INTRAVENOUS

## 2016-11-20 MED ORDER — PROPOFOL 10 MG/ML IV BOLUS
INTRAVENOUS | Status: DC | PRN
  Administered 2016-11-20: 160 mg via INTRAVENOUS
  Administered 2016-11-20 (×2): 50 mg via INTRAVENOUS

## 2016-11-20 MED ORDER — PHENYLEPHRINE 40 MCG/ML (10ML) SYRINGE FOR IV PUSH (FOR BLOOD PRESSURE SUPPORT)
PREFILLED_SYRINGE | INTRAVENOUS | Status: AC
Start: 2016-11-20 — End: 2016-11-20
  Filled 2016-11-20: qty 10

## 2016-11-20 MED ORDER — THROMBIN 20000 UNITS EX SOLR
CUTANEOUS | Status: AC
  Filled 2016-11-20: qty 20000

## 2016-11-20 MED ORDER — ATORVASTATIN CALCIUM 10 MG PO TABS
20.0000 mg | ORAL_TABLET | Freq: Every day | ORAL | Status: DC
  Administered 2016-11-21 – 2016-11-22 (×2): 20 mg via ORAL
  Filled 2016-11-20 (×2): qty 2

## 2016-11-20 MED ORDER — LABETALOL HCL 200 MG PO TABS
300.0000 mg | ORAL_TABLET | Freq: Two times a day (BID) | ORAL | Status: DC
  Administered 2016-11-20 – 2016-11-23 (×5): 300 mg via ORAL
  Filled 2016-11-20 (×6): qty 1

## 2016-11-20 MED ORDER — CHLORHEXIDINE GLUCONATE 4 % EX LIQD: 60.0000 mL | Freq: Once | CUTANEOUS | Status: DC

## 2016-11-20 MED ORDER — PROMETHAZINE HCL 25 MG/ML IJ SOLN: 6.2500 mg | INTRAMUSCULAR | Status: DC | PRN

## 2016-11-20 MED ORDER — ONDANSETRON HCL 4 MG/2ML IJ SOLN
INTRAMUSCULAR | Status: AC
  Filled 2016-11-20: qty 2

## 2016-11-20 MED ORDER — ADULT MULTIVITAMIN W/MINERALS CH
1.0000 | ORAL_TABLET | Freq: Every day | ORAL | Status: DC
  Administered 2016-11-20 – 2016-11-23 (×3): 1 via ORAL
  Filled 2016-11-20 (×3): qty 1

## 2016-11-20 MED ORDER — FOLIC ACID 400 MCG PO TABS: 400.0000 ug | ORAL_TABLET | Freq: Every day | ORAL | Status: DC

## 2016-11-20 MED ORDER — EPINEPHRINE PF 1 MG/ML IJ SOLN
INTRAMUSCULAR | Status: AC
  Filled 2016-11-20: qty 1

## 2016-11-20 MED ORDER — OXYCODONE-ACETAMINOPHEN 7.5-325 MG PO TABS
1.0000 | ORAL_TABLET | ORAL | Status: DC | PRN
  Administered 2016-11-20: 2 via ORAL
  Administered 2016-11-20 (×2): 1 via ORAL
  Administered 2016-11-21 – 2016-11-23 (×6): 2 via ORAL
  Filled 2016-11-20 (×7): qty 2
  Filled 2016-11-20 (×2): qty 1

## 2016-11-20 MED ORDER — ACETAMINOPHEN 650 MG RE SUPP: 650.0000 mg | RECTAL | Status: DC | PRN

## 2016-11-20 MED ORDER — ACETAMINOPHEN 325 MG PO TABS
650.0000 mg | ORAL_TABLET | ORAL | Status: DC | PRN
Start: 2016-11-20 — End: 2016-11-21

## 2016-11-20 MED ORDER — DOCUSATE SODIUM 100 MG PO CAPS
100.0000 mg | ORAL_CAPSULE | Freq: Two times a day (BID) | ORAL | Status: DC
  Administered 2016-11-20 – 2016-11-23 (×6): 100 mg via ORAL
  Filled 2016-11-20 (×6): qty 1

## 2016-11-20 MED ORDER — SIMVASTATIN 40 MG PO TABS
40.0000 mg | ORAL_TABLET | Freq: Every day | ORAL | Status: DC
  Administered 2016-11-20: 40 mg via ORAL
  Filled 2016-11-20: qty 1

## 2016-11-20 MED ORDER — DIAZEPAM 5 MG PO TABS
5.0000 mg | ORAL_TABLET | Freq: Four times a day (QID) | ORAL | Status: DC | PRN
  Administered 2016-11-20 (×2): 5 mg via ORAL
  Filled 2016-11-20 (×2): qty 1

## 2016-11-20 MED ORDER — FENTANYL CITRATE (PF) 100 MCG/2ML IJ SOLN
INTRAMUSCULAR | Status: DC | PRN
  Administered 2016-11-20 (×2): 100 ug via INTRAVENOUS
  Administered 2016-11-20 (×4): 50 ug via INTRAVENOUS

## 2016-11-20 MED ORDER — AMLODIPINE BESYLATE 10 MG PO TABS
10.0000 mg | ORAL_TABLET | Freq: Every day | ORAL | Status: DC
  Administered 2016-11-21 – 2016-11-23 (×3): 10 mg via ORAL
  Filled 2016-11-20 (×3): qty 1

## 2016-11-20 MED ORDER — LISINOPRIL 20 MG PO TABS
20.0000 mg | ORAL_TABLET | Freq: Every day | ORAL | Status: DC
  Administered 2016-11-20 – 2016-11-23 (×4): 20 mg via ORAL
  Filled 2016-11-20 (×4): qty 1

## 2016-11-20 MED ORDER — CEFAZOLIN SODIUM-DEXTROSE 2-4 GM/100ML-% IV SOLN
INTRAVENOUS | Status: AC
  Filled 2016-11-20: qty 100

## 2016-11-20 MED ORDER — FERROUS SULFATE 325 (65 FE) MG PO TABS
325.0000 mg | ORAL_TABLET | Freq: Every day | ORAL | Status: DC
  Administered 2016-11-22: 325 mg via ORAL
  Filled 2016-11-20 (×2): qty 1

## 2016-11-20 MED ORDER — CEFAZOLIN SODIUM-DEXTROSE 2-4 GM/100ML-% IV SOLN
2.0000 g | INTRAVENOUS | Status: AC
  Administered 2016-11-20: 2 g via INTRAVENOUS

## 2016-11-20 MED ORDER — FENTANYL CITRATE (PF) 250 MCG/5ML IJ SOLN
INTRAMUSCULAR | Status: AC
  Filled 2016-11-20: qty 10

## 2016-11-20 MED ORDER — HYDROCHLOROTHIAZIDE 12.5 MG PO CAPS
12.5000 mg | ORAL_CAPSULE | Freq: Every day | ORAL | Status: DC
  Administered 2016-11-20 – 2016-11-23 (×4): 12.5 mg via ORAL
  Filled 2016-11-20 (×4): qty 1

## 2016-11-20 MED ORDER — ARTIFICIAL TEARS OP OINT
TOPICAL_OINTMENT | OPHTHALMIC | Status: DC | PRN
  Administered 2016-11-20: 1 via OPHTHALMIC

## 2016-11-20 MED ORDER — 0.9 % SODIUM CHLORIDE (POUR BTL) OPTIME
TOPICAL | Status: DC | PRN
  Administered 2016-11-20: 1000 mL

## 2016-11-20 MED ORDER — FLEET ENEMA 7-19 GM/118ML RE ENEM: 1.0000 | ENEMA | Freq: Once | RECTAL | Status: DC | PRN

## 2016-11-20 MED ORDER — SENNOSIDES-DOCUSATE SODIUM 8.6-50 MG PO TABS: 1.0000 | ORAL_TABLET | Freq: Every evening | ORAL | Status: DC | PRN

## 2016-11-20 MED ORDER — VITAMIN E 45 MG (100 UNIT) PO CAPS: 200.0000 [IU] | ORAL_CAPSULE | Freq: Every evening | ORAL | Status: DC

## 2016-11-20 MED ORDER — BUPIVACAINE HCL (PF) 0.25 % IJ SOLN
INTRAMUSCULAR | Status: AC
  Filled 2016-11-20: qty 30

## 2016-11-20 MED ORDER — ONDANSETRON HCL 4 MG/2ML IJ SOLN
INTRAMUSCULAR | Status: DC | PRN
  Administered 2016-11-20 (×2): 4 mg via INTRAVENOUS

## 2016-11-20 MED ORDER — ONDANSETRON HCL 4 MG/2ML IJ SOLN: 4.0000 mg | Freq: Four times a day (QID) | INTRAMUSCULAR | Status: DC | PRN

## 2016-11-20 MED ORDER — MORPHINE SULFATE (PF) 2 MG/ML IV SOLN
1.0000 mg | INTRAVENOUS | Status: DC | PRN
  Administered 2016-11-20 – 2016-11-22 (×8): 2 mg via INTRAVENOUS
  Filled 2016-11-20 (×8): qty 1

## 2016-11-20 MED ORDER — PROPOFOL 10 MG/ML IV BOLUS
INTRAVENOUS | Status: AC
  Filled 2016-11-20: qty 40

## 2016-11-20 MED ORDER — LISINOPRIL-HYDROCHLOROTHIAZIDE 20-12.5 MG PO TABS: 1.0000 | ORAL_TABLET | Freq: Every day | ORAL | Status: DC

## 2016-11-20 MED ORDER — BUPIVACAINE-EPINEPHRINE 0.25% -1:200000 IJ SOLN
INTRAMUSCULAR | Status: DC | PRN
  Administered 2016-11-20: 6 mL

## 2016-11-20 MED ORDER — PHENOL 1.4 % MT LIQD
1.0000 | OROMUCOSAL | Status: DC | PRN
  Administered 2016-11-20: 1 via OROMUCOSAL
  Filled 2016-11-20: qty 177

## 2016-11-20 MED ORDER — PROPOFOL 10 MG/ML IV BOLUS
INTRAVENOUS | Status: AC
  Filled 2016-11-20: qty 20

## 2016-11-20 MED ORDER — PANTOPRAZOLE SODIUM 40 MG IV SOLR
40.0000 mg | Freq: Every day | INTRAVENOUS | Status: DC
  Administered 2016-11-20 – 2016-11-21 (×2): 40 mg via INTRAVENOUS
  Filled 2016-11-20 (×3): qty 40

## 2016-11-20 MED ORDER — LACTATED RINGERS IV SOLN: INTRAVENOUS | Status: DC

## 2016-11-20 MED ORDER — SODIUM CHLORIDE 0.9% FLUSH
3.0000 mL | Freq: Two times a day (BID) | INTRAVENOUS | Status: DC
  Administered 2016-11-20 (×2): 3 mL via INTRAVENOUS

## 2016-11-20 MED ORDER — MENTHOL 3 MG MT LOZG
1.0000 | LOZENGE | OROMUCOSAL | Status: DC | PRN
  Filled 2016-11-20: qty 9

## 2016-11-20 MED ORDER — SUCCINYLCHOLINE CHLORIDE 20 MG/ML IJ SOLN
INTRAMUSCULAR | Status: DC | PRN
  Administered 2016-11-20: 120 mg via INTRAVENOUS

## 2016-11-20 MED ORDER — IRON 28 MG PO TABS: 28.0000 mg | ORAL_TABLET | Freq: Every day | ORAL | Status: DC

## 2016-11-20 MED ORDER — ARTIFICIAL TEARS OP OINT
TOPICAL_OINTMENT | OPHTHALMIC | Status: AC
  Filled 2016-11-20: qty 3.5

## 2016-11-20 MED ORDER — LIDOCAINE 2% (20 MG/ML) 5 ML SYRINGE
INTRAMUSCULAR | Status: AC
  Filled 2016-11-20: qty 5

## 2016-11-20 MED ORDER — SODIUM CHLORIDE 0.9 % IV SOLN: 250.0000 mL | INTRAVENOUS | Status: DC

## 2016-11-20 MED ORDER — ARTIFICIAL TEARS OP OINT
TOPICAL_OINTMENT | OPHTHALMIC | Status: AC
  Filled 2016-11-20: qty 14

## 2016-11-20 MED ORDER — ZOLPIDEM TARTRATE 5 MG PO TABS: 5.0000 mg | ORAL_TABLET | Freq: Every evening | ORAL | Status: DC | PRN

## 2016-11-20 MED ORDER — FOLIC ACID 1 MG PO TABS
0.5000 mg | ORAL_TABLET | Freq: Every day | ORAL | Status: DC
  Administered 2016-11-20 – 2016-11-23 (×3): 0.5 mg via ORAL
  Filled 2016-11-20 (×3): qty 1

## 2016-11-20 MED ORDER — VITAMIN E 45 MG (100 UNIT) PO CAPS
200.0000 [IU] | ORAL_CAPSULE | Freq: Every day | ORAL | Status: DC
  Administered 2016-11-22 – 2016-11-23 (×2): 200 [IU] via ORAL
  Filled 2016-11-20 (×4): qty 2

## 2016-11-20 SURGICAL SUPPLY — 65 items
BLADE CLIPPER SURG (BLADE) IMPLANT
BLADE SURG 10 STRL SS (BLADE) ×2 IMPLANT
BOLT SPNL LRG 45X5.5XPLAT NS (Screw) ×2 IMPLANT
BONE VIVIGEN FORMABLE 5.4CC (Bone Implant) ×2 IMPLANT
COROENT XL-W 10X22X50MM (Cage) ×2 IMPLANT
COVER BACK TABLE 80X110 HD (DRAPES) ×2 IMPLANT
COVER SURGICAL LIGHT HANDLE (MISCELLANEOUS) ×2 IMPLANT
DRAPE C-ARM 42X72 X-RAY (DRAPES) ×2 IMPLANT
DRAPE C-ARMOR (DRAPES) ×2 IMPLANT
DRAPE POUCH INSTRU U-SHP 10X18 (DRAPES) ×2 IMPLANT
DRAPE SURG 17X23 STRL (DRAPES) ×8 IMPLANT
DURAPREP 26ML APPLICATOR (WOUND CARE) ×2 IMPLANT
ELECT BLADE 6.5 EXT (BLADE) ×2 IMPLANT
ELECT CAUTERY BLADE 6.4 (BLADE) ×2 IMPLANT
ELECT REM PT RETURN 9FT ADLT (ELECTROSURGICAL) ×2
ELECTRODE REM PT RTRN 9FT ADLT (ELECTROSURGICAL) ×1 IMPLANT
GAUZE SPONGE 4X4 12PLY STRL (GAUZE/BANDAGES/DRESSINGS) ×2 IMPLANT
GAUZE SPONGE 4X4 16PLY XRAY LF (GAUZE/BANDAGES/DRESSINGS) ×2 IMPLANT
GLOVE BIO SURGEON STRL SZ7 (GLOVE) ×6 IMPLANT
GLOVE BIO SURGEON STRL SZ8 (GLOVE) ×6 IMPLANT
GLOVE BIOGEL PI IND STRL 6.5 (GLOVE) ×1 IMPLANT
GLOVE BIOGEL PI IND STRL 7.0 (GLOVE) ×1 IMPLANT
GLOVE BIOGEL PI IND STRL 8 (GLOVE) ×1 IMPLANT
GLOVE BIOGEL PI INDICATOR 6.5 (GLOVE) ×1
GLOVE BIOGEL PI INDICATOR 7.0 (GLOVE) ×1
GLOVE BIOGEL PI INDICATOR 8 (GLOVE) ×1
GLOVE SURG SS PI 7.0 STRL IVOR (GLOVE) ×6 IMPLANT
GOWN STRL REUS W/ TWL LRG LVL3 (GOWN DISPOSABLE) ×2 IMPLANT
GOWN STRL REUS W/ TWL XL LVL3 (GOWN DISPOSABLE) ×2 IMPLANT
GOWN STRL REUS W/TWL LRG LVL3 (GOWN DISPOSABLE) ×2
GOWN STRL REUS W/TWL XL LVL3 (GOWN DISPOSABLE) ×2
KIT BASIN OR (CUSTOM PROCEDURE TRAY) ×2 IMPLANT
KIT DILATOR XLIF 5 (KITS) ×1 IMPLANT
KIT ROOM TURNOVER OR (KITS) ×2 IMPLANT
KIT SURGICAL ACCESS MAXCESS 4 (KITS) ×2 IMPLANT
KIT XLIF (KITS) ×1
MARKER SKIN DUAL TIP RULER LAB (MISCELLANEOUS) ×2 IMPLANT
MODULE EMG NEEDLE SSEP NVM5 (NEEDLE) ×2 IMPLANT
MODULE NVM5 NEXT GEN EMG (NEEDLE) ×2 IMPLANT
NEEDLE HYPO 25GX1X1/2 BEV (NEEDLE) ×2 IMPLANT
NEEDLE SPNL 18GX3.5 QUINCKE PK (NEEDLE) ×2 IMPLANT
NS IRRIG 1000ML POUR BTL (IV SOLUTION) ×4 IMPLANT
PACK LAMINECTOMY ORTHO (CUSTOM PROCEDURE TRAY) ×2 IMPLANT
PACK UNIVERSAL I (CUSTOM PROCEDURE TRAY) ×2 IMPLANT
PAD ARMBOARD 7.5X6 YLW CONV (MISCELLANEOUS) ×4 IMPLANT
PLATE DECADE XLIP 2H SZ12 (Plate) ×2 IMPLANT
PUTTY BONE DBX 2.5 MIS (Bone Implant) ×2 IMPLANT
SCREW 45MM (Screw) ×2 IMPLANT
SPONGE INTESTINAL PEANUT (DISPOSABLE) ×4 IMPLANT
SPONGE LAP 4X18 X RAY DECT (DISPOSABLE) ×2 IMPLANT
SPONGE SURGIFOAM ABS GEL 100 (HEMOSTASIS) ×2 IMPLANT
STAPLER VISISTAT 35W (STAPLE) ×2 IMPLANT
STRIP CLOSURE SKIN 1/2X4 (GAUZE/BANDAGES/DRESSINGS) ×2 IMPLANT
SURGIFLO W/THROMBIN 8M KIT (HEMOSTASIS) IMPLANT
SUT MNCRL AB 4-0 PS2 18 (SUTURE) ×2 IMPLANT
SUT VIC AB 0 CT1 18XCR BRD 8 (SUTURE) ×1 IMPLANT
SUT VIC AB 0 CT1 8-18 (SUTURE) ×1
SUT VIC AB 2-0 CT2 18 VCP726D (SUTURE) ×2 IMPLANT
SYR BULB IRRIGATION 50ML (SYRINGE) ×2 IMPLANT
SYR CONTROL 10ML LL (SYRINGE) ×2 IMPLANT
TOWEL OR 17X24 6PK STRL BLUE (TOWEL DISPOSABLE) ×2 IMPLANT
TOWEL OR 17X26 10 PK STRL BLUE (TOWEL DISPOSABLE) ×2 IMPLANT
TRAY FOLEY CATH SILVER 16FR (SET/KITS/TRAYS/PACK) ×2 IMPLANT
WATER STERILE IRR 1000ML POUR (IV SOLUTION) ×2 IMPLANT
YANKAUER SUCT BULB TIP NO VENT (SUCTIONS) ×2 IMPLANT

## 2016-11-20 NOTE — Anesthesia Postprocedure Evaluation (Addendum)
Anesthesia Post Note  Patient: Joyce [name]  Procedure(s) Performed: Procedure(s) (LRB): LEFT SIDED LUMBAR 3-4 LATERAL INTERBODY FUSION WITH ALLOGRAFT AND INSTRUMENTATION (Left)  Patient location during evaluation: PACU Anesthesia Type: General Level of consciousness: awake and alert Pain management: pain level controlled Vital Signs Assessment: post-procedure vital signs reviewed and stable Respiratory status: spontaneous breathing, nonlabored ventilation, respiratory function stable and patient connected to nasal cannula oxygen Cardiovascular status: blood pressure returned to baseline and stable Postop Assessment: no signs of nausea or vomiting Anesthetic complications: no       Last Vitals:  Vitals:   11/20/16 1215 11/20/16 1248  BP: (!) 152/65 (!) 155/87  Pulse: 60 60  Resp: 12 17  Temp: 36.6 C 36.6 C    Last Pain:  Vitals:   11/20/16 1248  TempSrc: Oral  PainSc:                  Effie Berkshire

## 2016-11-20 NOTE — Anesthesia Procedure Notes (Signed)
Procedure Name: Intubation Date/Time: 11/20/2016 8:51 AM Performed by: Jacquiline Doe A Pre-anesthesia Checklist: Patient identified, Emergency Drugs available, Suction available and Patient being monitored Patient Re-evaluated:Patient Re-evaluated prior to inductionOxygen Delivery Method: Circle System Utilized and Circle system utilized Preoxygenation: Pre-oxygenation with 100% oxygen Intubation Type: IV induction and Cricoid Pressure applied Ventilation: Mask ventilation without difficulty Laryngoscope Size: Mac and 4 Grade View: Grade I Tube type: Oral Tube size: 7.5 mm Number of attempts: 1 Airway Equipment and Method: Stylet Placement Confirmation: ETT inserted through vocal cords under direct vision,  positive ETCO2 and breath sounds checked- equal and bilateral Secured at: 22 cm Tube secured with: Tape Dental Injury: Teeth and Oropharynx as per pre-operative assessment

## 2016-11-20 NOTE — Progress Notes (Addendum)
Pt admitted to [facility].  Alert and oriented.  Bandage to left lateral side CDI.  Pt c/o pain.  Had been medicated in PACU before being transferred to floor.  Will medicate. Foley draining yellow urine. Denies nausea or tingling to legs. Family at bedside.  Bed alarm on, call bell within reach.  Pt verbalizes understanding to call before attempting to get out of bed.  Will continue to monitor.

## 2016-11-20 NOTE — Addendum Note (Signed)
Addendum  created 11/20/16 1327 by Fidela Juneau, CRNA   Anesthesia Intra Flowsheets edited, Anesthesia Intra LDAs edited, LDA properties accepted
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
## 2016-11-20 NOTE — Transfer of Care (Signed)
Immediate Anesthesia Transfer of Care Note  Patient: Kirstina C Parenteau  Procedure(s) Performed: Procedure(s) with comments: LEFT SIDED LUMBAR 3-4 LATERAL INTERBODY FUSION WITH ALLOGRAFT AND INSTRUMENTATION (Left) - LEFT SIDED LUMBAR 3-4 LATERAL INTERBODY FUSION WITH ALLOGRAFT AND INSTRUMENTATION  Patient Location: PACU  Anesthesia Type:General  Level of Consciousness: awake, oriented, sedated, patient cooperative and responds to stimulation  Airway & Oxygen Therapy: Patient Spontanous Breathing and Patient connected to nasal cannula oxygen  Post-op Assessment: Report given to RN and Post -op Vital signs reviewed and stable  Post vital signs: Reviewed and stable  Last Vitals:  Vitals:   11/20/16 0644  BP: (!) 192/69  Pulse: (!) 58  Resp: 20  Temp: 37.2 C    Last Pain:  Vitals:   11/20/16 0653  PainSc: 9          Complications: No apparent anesthesia complications

## 2016-11-21 ENCOUNTER — Encounter (HOSPITAL_COMMUNITY)
Admission: RE | Disposition: A | Discharge status: Home Health | Payer: Self-pay | Source: Ambulatory Visit | Attending: Orthopedic Surgery

## 2016-11-21 ENCOUNTER — Inpatient Hospital Stay (HOSPITAL_COMMUNITY): Payer: Medicare Other | Admitting: Anesthesiology

## 2016-11-21 ENCOUNTER — Encounter (HOSPITAL_COMMUNITY): Payer: Self-pay | Admitting: Anesthesiology

## 2016-11-21 ENCOUNTER — Inpatient Hospital Stay (HOSPITAL_COMMUNITY): Payer: Medicare Other

## 2016-11-21 ENCOUNTER — Inpatient Hospital Stay (HOSPITAL_COMMUNITY): Admission: RE | Admit: 2016-11-21 | Payer: Medicare Other | Source: Ambulatory Visit | Admitting: Orthopedic Surgery

## 2016-11-21 SURGERY — POSTERIOR LUMBAR FUSION 3 WITH HARDWARE REMOVAL
Anesthesia: General

## 2016-11-21 MED ORDER — DEXTROSE 5 % IV SOLN
INTRAVENOUS | Status: DC | PRN
Start: 2016-11-21 — End: 2016-11-21
  Administered 2016-11-21: 08:00:00 via INTRAVENOUS

## 2016-11-21 MED ORDER — FENTANYL CITRATE (PF) 250 MCG/5ML IJ SOLN
INTRAMUSCULAR | Status: AC
  Filled 2016-11-21: qty 10

## 2016-11-21 MED ORDER — ONDANSETRON HCL 4 MG/2ML IJ SOLN
INTRAMUSCULAR | Status: AC
  Filled 2016-11-21: qty 4

## 2016-11-21 MED ORDER — CEFAZOLIN SODIUM-DEXTROSE 2-4 GM/100ML-% IV SOLN
2.0000 g | INTRAVENOUS | Status: AC
  Administered 2016-11-21 (×2): 2 g via INTRAVENOUS
  Filled 2016-11-21: qty 100

## 2016-11-21 MED ORDER — BUPIVACAINE-EPINEPHRINE 0.25% -1:200000 IJ SOLN
INTRAMUSCULAR | Status: DC | PRN
  Administered 2016-11-21: 30 mL

## 2016-11-21 MED ORDER — METHYLENE BLUE 0.5 % INJ SOLN
INTRAVENOUS | Status: AC
  Filled 2016-11-21: qty 10

## 2016-11-21 MED ORDER — DIAZEPAM 5 MG PO TABS
5.0000 mg | ORAL_TABLET | Freq: Four times a day (QID) | ORAL | Status: DC | PRN
  Administered 2016-11-21 – 2016-11-23 (×5): 5 mg via ORAL
  Filled 2016-11-21 (×5): qty 1

## 2016-11-21 MED ORDER — ONDANSETRON HCL 4 MG/2ML IJ SOLN
INTRAMUSCULAR | Status: DC | PRN
  Administered 2016-11-21 (×2): 4 mg via INTRAVENOUS

## 2016-11-21 MED ORDER — FLEET ENEMA 7-19 GM/118ML RE ENEM: 1.0000 | ENEMA | Freq: Once | RECTAL | Status: DC | PRN

## 2016-11-21 MED ORDER — SENNOSIDES-DOCUSATE SODIUM 8.6-50 MG PO TABS: 1.0000 | ORAL_TABLET | Freq: Every evening | ORAL | Status: DC | PRN

## 2016-11-21 MED ORDER — BUPIVACAINE LIPOSOME 1.3 % IJ SUSP
INTRAMUSCULAR | Status: DC | PRN
  Administered 2016-11-21: 20 mL

## 2016-11-21 MED ORDER — BUPIVACAINE HCL (PF) 0.25 % IJ SOLN
INTRAMUSCULAR | Status: AC
  Filled 2016-11-21: qty 30

## 2016-11-21 MED ORDER — EPHEDRINE 5 MG/ML INJ
INTRAVENOUS | Status: AC
  Filled 2016-11-21: qty 20

## 2016-11-21 MED ORDER — ROCURONIUM BROMIDE 100 MG/10ML IV SOLN
INTRAVENOUS | Status: DC | PRN
Start: 2016-11-21 — End: 2016-11-21
  Administered 2016-11-21: 30 mg via INTRAVENOUS
  Administered 2016-11-21: 50 mg via INTRAVENOUS

## 2016-11-21 MED ORDER — ALBUMIN HUMAN 5 % IV SOLN
INTRAVENOUS | Status: DC | PRN
  Administered 2016-11-21: 08:00:00 via INTRAVENOUS

## 2016-11-21 MED ORDER — ARTIFICIAL TEARS OP OINT
TOPICAL_OINTMENT | OPHTHALMIC | Status: AC
  Filled 2016-11-21: qty 7

## 2016-11-21 MED ORDER — SUGAMMADEX SODIUM 200 MG/2ML IV SOLN
INTRAVENOUS | Status: DC | PRN
  Administered 2016-11-21: 200 mg via INTRAVENOUS

## 2016-11-21 MED ORDER — SODIUM CHLORIDE 0.9 % IV SOLN: 250.0000 mL | INTRAVENOUS | Status: DC

## 2016-11-21 MED ORDER — PROPOFOL 10 MG/ML IV BOLUS
INTRAVENOUS | Status: AC
  Filled 2016-11-21: qty 20

## 2016-11-21 MED ORDER — ONDANSETRON HCL 4 MG/2ML IJ SOLN: 4.0000 mg | Freq: Four times a day (QID) | INTRAMUSCULAR | Status: DC | PRN

## 2016-11-21 MED ORDER — LIDOCAINE HCL (CARDIAC) 20 MG/ML IV SOLN
INTRAVENOUS | Status: DC | PRN
  Administered 2016-11-21: 60 mg via INTRAVENOUS
  Administered 2016-11-21: 40 mg via INTRAVENOUS

## 2016-11-21 MED ORDER — LACTATED RINGERS IV SOLN
INTRAVENOUS | Status: DC | PRN
  Administered 2016-11-21 (×2): via INTRAVENOUS

## 2016-11-21 MED ORDER — FENTANYL CITRATE (PF) 100 MCG/2ML IJ SOLN
INTRAMUSCULAR | Status: DC | PRN
  Administered 2016-11-21 (×7): 50 ug via INTRAVENOUS
  Administered 2016-11-21: 100 ug via INTRAVENOUS
  Administered 2016-11-21: 50 ug via INTRAVENOUS

## 2016-11-21 MED ORDER — THROMBIN 20000 UNITS EX KIT
PACK | CUTANEOUS | Status: DC | PRN
Start: 2016-11-21 — End: 2016-11-21
  Administered 2016-11-21: 20000 [IU] via TOPICAL

## 2016-11-21 MED ORDER — ONDANSETRON HCL 4 MG PO TABS: 4.0000 mg | ORAL_TABLET | Freq: Four times a day (QID) | ORAL | Status: DC | PRN

## 2016-11-21 MED ORDER — FENTANYL CITRATE (PF) 100 MCG/2ML IJ SOLN: 25.0000 ug | INTRAMUSCULAR | Status: DC | PRN

## 2016-11-21 MED ORDER — CEFAZOLIN SODIUM-DEXTROSE 2-4 GM/100ML-% IV SOLN
2.0000 g | Freq: Three times a day (TID) | INTRAVENOUS | Status: AC
  Administered 2016-11-21 – 2016-11-22 (×2): 2 g via INTRAVENOUS
  Filled 2016-11-21 (×2): qty 100

## 2016-11-21 MED ORDER — BUPIVACAINE LIPOSOME 1.3 % IJ SUSP
20.0000 mL | INTRAMUSCULAR | Status: DC
  Filled 2016-11-21: qty 20

## 2016-11-21 MED ORDER — PROPOFOL 10 MG/ML IV BOLUS
INTRAVENOUS | Status: DC | PRN
Start: 2016-11-21 — End: 2016-11-21
  Administered 2016-11-21: 150 mg via INTRAVENOUS

## 2016-11-21 MED ORDER — PROPOFOL 500 MG/50ML IV EMUL
INTRAVENOUS | Status: DC | PRN
  Administered 2016-11-21: 75 ug/kg/min via INTRAVENOUS

## 2016-11-21 MED ORDER — 0.9 % SODIUM CHLORIDE (POUR BTL) OPTIME
TOPICAL | Status: DC | PRN
  Administered 2016-11-21 (×5): 1000 mL

## 2016-11-21 MED ORDER — MIDAZOLAM HCL 2 MG/2ML IJ SOLN
INTRAMUSCULAR | Status: AC
  Filled 2016-11-21: qty 2

## 2016-11-21 MED ORDER — THROMBIN 20000 UNITS EX SOLR
CUTANEOUS | Status: AC
  Filled 2016-11-21: qty 20000

## 2016-11-21 MED ORDER — EPINEPHRINE PF 1 MG/ML IJ SOLN
INTRAMUSCULAR | Status: AC
  Filled 2016-11-21: qty 1

## 2016-11-21 MED ORDER — PANTOPRAZOLE SODIUM 40 MG IV SOLR: 40.0000 mg | Freq: Every day | INTRAVENOUS | Status: DC

## 2016-11-21 MED ORDER — POTASSIUM CHLORIDE IN NACL 20-0.9 MEQ/L-% IV SOLN
INTRAVENOUS | Status: DC
  Administered 2016-11-21 (×2): via INTRAVENOUS
  Filled 2016-11-21: qty 1000

## 2016-11-21 MED ORDER — EPHEDRINE SULFATE 50 MG/ML IJ SOLN
INTRAMUSCULAR | Status: DC | PRN
  Administered 2016-11-21 (×2): 10 mg via INTRAVENOUS

## 2016-11-21 MED ORDER — SODIUM CHLORIDE 0.9% FLUSH: 3.0000 mL | INTRAVENOUS | Status: DC | PRN

## 2016-11-21 MED ORDER — ARTIFICIAL TEARS OP OINT
TOPICAL_OINTMENT | OPHTHALMIC | Status: DC | PRN
  Administered 2016-11-21: 1 via OPHTHALMIC

## 2016-11-21 MED ORDER — CEFAZOLIN SODIUM 1 G IJ SOLR
INTRAMUSCULAR | Status: AC
  Filled 2016-11-21: qty 20

## 2016-11-21 MED ORDER — PHENYLEPHRINE HCL 10 MG/ML IJ SOLN
INTRAMUSCULAR | Status: DC | PRN
  Administered 2016-11-21: 45 ug/min via INTRAVENOUS

## 2016-11-21 MED ORDER — MIDAZOLAM HCL 5 MG/5ML IJ SOLN
INTRAMUSCULAR | Status: DC | PRN
Start: 2016-11-21 — End: 2016-11-21
  Administered 2016-11-21: 2 mg via INTRAVENOUS

## 2016-11-21 MED ORDER — SODIUM CHLORIDE 0.9% FLUSH
3.0000 mL | Freq: Two times a day (BID) | INTRAVENOUS | Status: DC
Start: 2016-11-21 — End: 2016-11-23
  Administered 2016-11-21 – 2016-11-23 (×5): 3 mL via INTRAVENOUS

## 2016-11-21 MED ORDER — HEMOSTATIC AGENTS (NO CHARGE) OPTIME
TOPICAL | Status: DC | PRN
  Administered 2016-11-21: 1 via TOPICAL

## 2016-11-21 SURGICAL SUPPLY — 90 items
BENZOIN TINCTURE PRP APPL 2/3 (GAUZE/BANDAGES/DRESSINGS) ×2 IMPLANT
BLADE CLIPPER SURG (BLADE) IMPLANT
BONE VIVIGEN FORMABLE 5.4CC (Bone Implant) ×2 IMPLANT
BUR PRESCISION 1.7 ELITE (BURR) ×2 IMPLANT
BUR ROUND PRECISION 4.0 (BURR) ×2 IMPLANT
BUR SABER RD CUTTING 3.0 (BURR) IMPLANT
CARTRIDGE OIL MAESTRO DRILL (MISCELLANEOUS) ×1 IMPLANT
CLSR STERI-STRIP ANTIMIC 1/2X4 (GAUZE/BANDAGES/DRESSINGS) ×2 IMPLANT
CONT SPEC 4OZ CLIKSEAL STRL BL (MISCELLANEOUS) IMPLANT
COUNTER NEEDLE 20 DBL MAG RED (NEEDLE) ×2 IMPLANT
COVER MAYO STAND STRL (DRAPES) ×4 IMPLANT
COVER SURGICAL LIGHT HANDLE (MISCELLANEOUS) ×2 IMPLANT
DIFFUSER DRILL AIR PNEUMATIC (MISCELLANEOUS) ×2 IMPLANT
DRAIN CHANNEL 15F RND FF W/TCR (WOUND CARE) IMPLANT
DRAPE C-ARM 42X72 X-RAY (DRAPES) ×2 IMPLANT
DRAPE C-ARMOR (DRAPES) ×2 IMPLANT
DRAPE POUCH INSTRU U-SHP 10X18 (DRAPES) ×2 IMPLANT
DRAPE SURG 17X23 STRL (DRAPES) ×8 IMPLANT
DURAPREP 26ML APPLICATOR (WOUND CARE) ×2 IMPLANT
ELECT BLADE 4.0 EZ CLEAN MEGAD (MISCELLANEOUS) ×4
ELECT CAUTERY BLADE 6.4 (BLADE) ×4 IMPLANT
ELECT REM PT RETURN 9FT ADLT (ELECTROSURGICAL) ×4
ELECTRODE BLDE 4.0 EZ CLN MEGD (MISCELLANEOUS) ×2 IMPLANT
ELECTRODE REM PT RTRN 9FT ADLT (ELECTROSURGICAL) ×2 IMPLANT
EVACUATOR SILICONE 100CC (DRAIN) IMPLANT
FEE INTRAOP MONITOR IMPULS NCS (MISCELLANEOUS) ×1 IMPLANT
GAUZE SPONGE 4X4 12PLY STRL (GAUZE/BANDAGES/DRESSINGS) ×2 IMPLANT
GAUZE SPONGE 4X4 16PLY XRAY LF (GAUZE/BANDAGES/DRESSINGS) ×2 IMPLANT
GLOVE BIO SURGEON STRL SZ7 (GLOVE) ×2 IMPLANT
GLOVE BIO SURGEON STRL SZ8 (GLOVE) ×2 IMPLANT
GLOVE BIOGEL PI IND STRL 7.0 (GLOVE) ×1 IMPLANT
GLOVE BIOGEL PI IND STRL 8 (GLOVE) ×1 IMPLANT
GLOVE BIOGEL PI INDICATOR 7.0 (GLOVE) ×1
GLOVE BIOGEL PI INDICATOR 8 (GLOVE) ×1
GLOVE SURG SS PI 6.5 STRL IVOR (GLOVE) ×4 IMPLANT
GOWN STRL REUS W/ TWL LRG LVL3 (GOWN DISPOSABLE) ×2 IMPLANT
GOWN STRL REUS W/ TWL XL LVL3 (GOWN DISPOSABLE) ×1 IMPLANT
GOWN STRL REUS W/TWL LRG LVL3 (GOWN DISPOSABLE) ×2
GOWN STRL REUS W/TWL XL LVL3 (GOWN DISPOSABLE) ×1
GUIDEWIRE SHARP VIPER II (WIRE) ×4 IMPLANT
INTRAOP MONITOR FEE IMPULS NCS (MISCELLANEOUS) ×1
INTRAOP MONITOR FEE IMPULSE (MISCELLANEOUS) ×1
IV CATH 14GX2 1/4 (CATHETERS) ×2 IMPLANT
KIT BASIN OR (CUSTOM PROCEDURE TRAY) ×2 IMPLANT
KIT POSITION SURG JACKSON T1 (MISCELLANEOUS) ×2 IMPLANT
KIT ROOM TURNOVER OR (KITS) ×2 IMPLANT
MARKER SKIN DUAL TIP RULER LAB (MISCELLANEOUS) ×2 IMPLANT
NDL SAFETY ECLIPSE 18X1.5 (NEEDLE) ×1 IMPLANT
NEEDLE 22X1 1/2 (OR ONLY) (NEEDLE) ×2 IMPLANT
NEEDLE HYPO 18GX1.5 SHARP (NEEDLE) ×1
NEEDLE HYPO 25GX1X1/2 BEV (NEEDLE) ×2 IMPLANT
NEEDLE JAMSHIDI VIPER (NEEDLE) ×4 IMPLANT
NEEDLE SPNL 18GX3.5 QUINCKE PK (NEEDLE) ×4 IMPLANT
NS IRRIG 1000ML POUR BTL (IV SOLUTION) ×8 IMPLANT
OIL CARTRIDGE MAESTRO DRILL (MISCELLANEOUS) ×2
PACK LAMINECTOMY ORTHO (CUSTOM PROCEDURE TRAY) ×2 IMPLANT
PACK UNIVERSAL I (CUSTOM PROCEDURE TRAY) ×2 IMPLANT
PAD ARMBOARD 7.5X6 YLW CONV (MISCELLANEOUS) ×4 IMPLANT
PATTIES SURGICAL .5 X1 (DISPOSABLE) ×2 IMPLANT
PATTIES SURGICAL .5X1.5 (GAUZE/BANDAGES/DRESSINGS) ×2 IMPLANT
RASP 3.0MM (RASP) ×2 IMPLANT
ROD VIPER II LORDOSED 5.5X40 (Rod) ×4 IMPLANT
ROD VIPER2 5.5X45 PRE LARDOSED (Rod) ×2 IMPLANT
SCREW EXPEDIUM POLYAXIAL 7X40M (Screw) ×4 IMPLANT
SCREW SET SINGLE INNER MIS (Screw) ×8 IMPLANT
SCREW VIPER X-TAB 6.0X40 (Screw) ×4 IMPLANT
SPONGE INTESTINAL PEANUT (DISPOSABLE) ×4 IMPLANT
SPONGE SURGIFOAM ABS GEL 100 (HEMOSTASIS) ×2 IMPLANT
STRIP CLOSURE SKIN 1/2X4 (GAUZE/BANDAGES/DRESSINGS) ×4 IMPLANT
SURGIFLO W/THROMBIN 8M KIT (HEMOSTASIS) IMPLANT
SUT BONE WAX W31G (SUTURE) ×4 IMPLANT
SUT MNCRL AB 4-0 PS2 18 (SUTURE) ×4 IMPLANT
SUT VIC AB 0 CT1 18XCR BRD 8 (SUTURE) ×1 IMPLANT
SUT VIC AB 0 CT1 8-18 (SUTURE) ×1
SUT VIC AB 1 CT1 18XCR BRD 8 (SUTURE) ×5 IMPLANT
SUT VIC AB 1 CT1 8-18 (SUTURE) ×5
SUT VIC AB 2-0 CT2 18 VCP726D (SUTURE) ×6 IMPLANT
SYR 20CC LL (SYRINGE) ×2 IMPLANT
SYR BULB IRRIGATION 50ML (SYRINGE) ×2 IMPLANT
SYR CONTROL 10ML LL (SYRINGE) ×4 IMPLANT
SYR TB 1ML LUER SLIP (SYRINGE) ×2 IMPLANT
TAP CANN VIPER2 DL 5.0 (TAP) ×2 IMPLANT
TAP CANN VIPER2 DL 6.0 (TAP) ×4 IMPLANT
TAPE CLOTH SURG 4X10 WHT LF (GAUZE/BANDAGES/DRESSINGS) ×2 IMPLANT
TOWEL OR 17X24 6PK STRL BLUE (TOWEL DISPOSABLE) ×2 IMPLANT
TOWEL OR 17X26 10 PK STRL BLUE (TOWEL DISPOSABLE) ×2 IMPLANT
TRAY FOLEY W/METER SILVER 16FR (SET/KITS/TRAYS/PACK) IMPLANT
TUBE CONNECTING 20X1/4 (TUBING) ×2 IMPLANT
WATER STERILE IRR 1000ML POUR (IV SOLUTION) IMPLANT
YANKAUER SUCT BULB TIP NO VENT (SUCTIONS) ×6 IMPLANT

## 2016-11-21 NOTE — Anesthesia Postprocedure Evaluation (Signed)
Anesthesia Post Note  Patient: Joyce [name]  Procedure(s) Performed: Procedure(s) (LRB): Revision Lumbar four through sacral one hardware extending to lumbar three and four with instrumentation and hardware (N/A)  Patient location during evaluation: PACU Anesthesia Type: General Level of consciousness: awake Pain management: pain level controlled Vital Signs Assessment: post-procedure vital signs reviewed and stable Cardiovascular status: stable Anesthetic complications: no       Last Vitals:  Vitals:   11/21/16 1324 11/21/16 1325  BP: (!) 196/84   Pulse:  72  Resp:  20  Temp:      Last Pain:  Vitals:   11/21/16 1240  TempSrc:   PainSc: 0-No pain                 Rikki Trosper

## 2016-11-21 NOTE — Progress Notes (Signed)
PT Cancellation Note  Patient Details Name: Joyce [name] MRN: [id number] DOB: 1956/01/04   Cancelled Treatment:    Reason Eval/Treat Not Completed: Other (comment) (pt had 2nd part of surgery today, will do PT eval tomorrow. )   Philomena Doheny 11/21/2016, 2:26 PM [phone]

## 2016-11-21 NOTE — Progress Notes (Signed)
D/t elevated sbp >190, given am BP meds now

## 2016-11-21 NOTE — Transfer of Care (Signed)
Immediate Anesthesia Transfer of Care Note  Patient: Joyce [name]  Procedure(s) Performed: Procedure(s) with comments: Revision Lumbar four through sacral one hardware extending to lumbar three and four with instrumentation and hardware (N/A) - LUMBAR 3-4 POSTERIOR SPINAL FUSION WITH INSTRUMENTATION AND ALLOGRAFT  Patient Location: PACU  Anesthesia Type:General  Level of Consciousness: awake, oriented, sedated, patient cooperative and responds to stimulation  Airway & Oxygen Therapy: Patient Spontanous Breathing and Patient connected to nasal cannula oxygen  Post-op Assessment: Report given to RN, Post -op Vital signs reviewed and stable, Patient moving all extremities and Patient moving all extremities X 4  Post vital signs: Reviewed and stable  Last Vitals:  Vitals:   11/21/16 0137 11/21/16 0605  BP: (!) 137/51 (!) 150/56  Pulse: 66 63  Resp: 16 16  Temp: 37.1 C 37.1 C    Last Pain:  Vitals:   11/21/16 0605  TempSrc: Oral  PainSc:       Patients Stated Pain Goal: 3 (75/79/72 8206)  Complications: No apparent anesthesia complications

## 2016-11-21 NOTE — Anesthesia Preprocedure Evaluation (Signed)
Anesthesia Evaluation  Patient identified by MRN, date of birth, ID band Patient awake    Reviewed: Allergy & Precautions, NPO status , Patient's Chart, lab work & pertinent test results  Airway Mallampati: II  TM Distance: >3 FB     Dental   Pulmonary pneumonia, Current Smoker,    breath sounds clear to auscultation       Cardiovascular hypertension,  Rhythm:Regular Rate:Normal     Neuro/Psych    GI/Hepatic negative GI ROS, Neg liver ROS,   Endo/Other  negative endocrine ROS  Renal/GU negative Renal ROS     Musculoskeletal   Abdominal   Peds  Hematology   Anesthesia Other Findings   Reproductive/Obstetrics                             Anesthesia Physical Anesthesia Plan  ASA: III  Anesthesia Plan: General   Post-op Pain Management:    Induction: Intravenous  Airway Management Planned: Oral ETT  Additional Equipment:   Intra-op Plan:   Post-operative Plan: Possible Post-op intubation/ventilation  Informed Consent: I have reviewed the patients History and Physical, chart, labs and discussed the procedure including the risks, benefits and alternatives for the proposed anesthesia with the patient or authorized representative who has indicated his/her understanding and acceptance.   Dental advisory given  Plan Discussed with: CRNA and Anesthesiologist  Anesthesia Plan Comments:         Anesthesia Quick Evaluation

## 2016-11-21 NOTE — H&P (Signed)
Patient presents today for stage 2 of her 2-staged procedure (PSF L3/4). Will proceed as discussed.

## 2016-11-21 NOTE — Progress Notes (Signed)
Patient went to OR for surgery.  Alert and oriented not in pain.

## 2016-11-21 NOTE — Op Note (Signed)
NAME:  Joyce [name], Joyce [name]                       ACCOUNT NO.:  MEDICAL RECORD NO.:  [id number]  LOCATION:                                 FACILITY:  PHYSICIAN:  Phylliss Bob, MD      DATE OF BIRTH:  [birth year]  DATE OF PROCEDURE:  11/20/2016                              OPERATIVE REPORT   PREOPERATIVE DIAGNOSIS: 1. Adjacent segment disease, L3-4, above the patient's previous L4-5     and L5-S1 fusion. 2. L3-4 spinal stenosis. 3. Bilateral leg pain.  POSTOPERATIVE DIAGNOSIS: 1. Adjacent segment disease, L3-4, above the patient's previous L4-5     and L5-S1 fusion. 2. L3-4 spinal stenosis. 3. Bilateral leg pain.  PROCEDURE (STAGE 1 OF 2): 1. Anterior lumbar interbody fusion, via left-sided direct lateral     approach, L3-4. 2. Placement of anterior instrumentation, L3-4. 3. Insertion of interbody device x1 (NuVasive intervertebral spacer,     12 x 22 x 50). 4. Use of morselized allograft-DBS mix in combination with ViviGen. 5. Intraoperative use of fluoroscopy.  SURGEON:  Phylliss Bob, MD  ASSISTANT:  Pricilla Holm, PA-C.  ANESTHESIA:  General endotracheal anesthesia.  COMPLICATIONS:  None.  DISPOSITION:  Stable.  ESTIMATED BLOOD LOSS:  Minimal.  INDICATIONS FOR SURGERY:  Briefly, Joyce [name] is a very pleasant 61 year old female, who is approximately 17 years status post a previous L4-S1 fusion.  The patient did well from that surgery, but did go on to have recurrence of her back and bilateral leg pain.  An updated MRI did reveal spinal stenosis at the level above her previous fusion, at L3-4. Given the patient's ongoing pain and dysfunction, we did discuss proceeding with a staged procedure, starting with an anterior lumbar interbody fusion via left-sided approach with instrumentation and allograft, to be followed by a posterior spinal fusion with revision of her instrumentation on the following day.  The patient was fully aware of the risks and limitations of surgery  and did elect to proceed.  OPERATIVE DETAILS:  On November 20, 2016, the patient was brought to surgery and general endotracheal anesthesia was administered.  The patient was placed in the lateral decubitus position with a left side up.  All bony prominences were meticulously padded.  An axillary roll was placed under the patient's right axilla.  Both arms were secured appropriately to arm boards and appropriately padded.  The patient's torso, hips, and legs were secured to the bed using tape.  The bed was then gently flexed in order to optimize access to the L3-4 intervertebral space.  The fluoroscopy was then brought into the field. The bed was appropriately positioned, so that the appropriate AP and lateral fluoroscopic images could be obtained.  The left flank was then prepped and draped in the usual sterile fashion.  I then made a left- sided transverse incision overlying the region of the L3-4 intervertebral space.  The external and internal oblique musculature were dissected.  The transversalis fascia was encountered and entered. The retroperitoneal space was readily encountered.  The left psoas muscle was readily noted and an initial dilator was advanced through the psoas muscle per the standard technique.  The initial dilator was then docked over the posterior 1/3 of the L3-4 intervertebral space, as confirmed using intraoperative fluoroscopy.  I then sequentially dilated and a self-retaining retractor was placed, centered over the L3-4 intervertebral space.  The dilator was then anchored to the bed using a rigid arm.  I did use neurologic monitoring and EMG testing in order in ensure that there were no neurologic structures in the immediate vicinity of the exposed intervertebral disk space.  The retractor was then gently opened.  The intervertebral space was readily encountered. I then used a knife to perform an annulotomy.  A Cobb was used to release the contralateral annulus.  I  then used a series of paddle scrapers, in addition to multiple curettes and pituitary rongeurs in order to perform a thorough and complete L3-4 intervertebral diskectomy. I then placed a series of trials and the above-mentioned intervertebral spacer was packed with ViviGen and DBS mix and tamped into position in the usual fashion.  I was very pleased with the press-fit of the implant and the restoration of the intervertebral disk height.  The wound was then copiously irrigated.  At this point, I did proceed with placing the instrumentation.  A 12 mm 2 hole plate was placed over the lateral aspect of the spine.  Holes were cannulated into the L3 and L4 vertebral bodies.  A 45 mm screws were placed through the plate at L3 and L4.  The bed was then flattened and the plate was locked per the standard technique.  I was very pleased with the final AP and lateral fluoroscopic images.  I was very pleased with the press-fit of each of the screws.  The wound was again irrigated thoroughly.  The fascia was then closed using 0 Vicryl.  The subcutaneous layer was closed using 0 Vicryl and 2-0 Vicryl and the skin was closed using 4-0 Monocryl. Benzoin and Steri-Strips were applied followed by sterile dressing.  All instrument counts were correct at the termination of the procedure.  Of note, there was no sustained abnormal EMG activity noted throughout the entire surgery.  Of note, Pricilla Holm was my assistant throughout surgery, and did aid in retraction, suctioning, and closure from start to finish.  The plan at this point is for the patient to return for stage II of her surgery tomorrow, which will involve a revision of her previously placed instrumentation with placement of new instrumentation into the L3 level, with a posterior spinal fusion at L3-4.     Phylliss Bob, MD     MD/MEDQ  D:  11/20/2016  T:  11/20/2016  Job:  [id number]

## 2016-11-21 NOTE — Anesthesia Procedure Notes (Signed)
Procedure Name: Intubation Date/Time: 11/21/2016 7:35 AM Performed by: Jacquiline Doe A Pre-anesthesia Checklist: Patient identified, Emergency Drugs available, Suction available and Patient being monitored Patient Re-evaluated:Patient Re-evaluated prior to inductionOxygen Delivery Method: Circle System Utilized and Circle system utilized Preoxygenation: Pre-oxygenation with 100% oxygen Intubation Type: IV induction and Cricoid Pressure applied Ventilation: Mask ventilation without difficulty Laryngoscope Size: Mac and 4 Grade View: Grade I Tube type: Oral Tube size: 7.5 mm Number of attempts: 1 Airway Equipment and Method: Stylet Placement Confirmation: ETT inserted through vocal cords under direct vision,  positive ETCO2 and breath sounds checked- equal and bilateral Secured at: 22 cm Tube secured with: Tape Dental Injury: Teeth and Oropharynx as per pre-operative assessment

## 2016-11-22 MED ORDER — PANTOPRAZOLE SODIUM 40 MG IV SOLR: 40.0000 mg | Freq: Every day | INTRAVENOUS | Status: DC

## 2016-11-22 MED ORDER — ACETAMINOPHEN 325 MG PO TABS
650.0000 mg | ORAL_TABLET | Freq: Four times a day (QID) | ORAL | Status: DC | PRN
  Administered 2016-11-22: 650 mg via ORAL
  Filled 2016-11-22: qty 2

## 2016-11-22 MED ORDER — PANTOPRAZOLE SODIUM 40 MG PO TBEC
40.0000 mg | DELAYED_RELEASE_TABLET | Freq: Every day | ORAL | Status: DC
  Administered 2016-11-22: 40 mg via ORAL
  Filled 2016-11-22: qty 1

## 2016-11-22 MED ORDER — OXYCODONE HCL ER 20 MG PO T12A
20.0000 mg | EXTENDED_RELEASE_TABLET | Freq: Two times a day (BID) | ORAL | Status: DC
  Administered 2016-11-22 – 2016-11-23 (×3): 20 mg via ORAL
  Filled 2016-11-22 (×3): qty 1

## 2016-11-22 NOTE — Evaluation (Signed)
Occupational Therapy Evaluation Patient Details Name: Joyce [name] MRN: [id number] DOB: Jun 23, 1956 Today's Date: 11/22/2016    History of Present Illness S/P Left lateral 3/4 fusion/removal of pre-existing hardware and placement of posterior instrumentation for spinal stenosis and BIL leg pain.    Clinical Impression   PTA Pt independent in ADL/IADL and mobility. Pt currently mod assist for ADL and mobility with RW. Please see OT problem list below. Pt will benefit from skilled OT in the acute setting  To maximize safety and independence. Next session to focus on AE education and tub transfer (education with 3 in 1)    Follow Up Recommendations  No OT follow up;Supervision/Assistance - 24 hour    Equipment Recommendations  3 in 1 bedside commode    Recommendations for Other Services       Precautions / Restrictions Precautions Precautions: Fall;Back Precaution Booklet Issued: Yes (comment) Precaution Comments: handout reviewed in full with Pt and husband Required Braces or Orthoses: Spinal Brace Spinal Brace: Thoracolumbosacral orthotic;Applied in sitting position;Applied in standing position Restrictions Weight Bearing Restrictions: No      Mobility Bed Mobility Overal bed mobility: Needs Assistance Bed Mobility: Rolling;Sidelying to Sit Rolling: Min guard Sidelying to sit: Min assist;+2 for physical assistance;Mod assist       General bed mobility comments: used pad to assist pt with positioning for log roll and to keep in alignment. Mod assist at times with pad and for elevation of trunk.   Transfers Overall transfer level: Needs assistance Equipment used: Rolling walker (2 wheeled) Transfers: Sit to/from [facility] Sit to Stand: Min guard;+2 safety/equipment Stand pivot transfers: Min guard;+2 safety/equipment       General transfer comment: Pt steadying assist only to come to stand from bed and from 3N1 with incr time needed.     Balance  Overall balance assessment: Needs assistance;History of Falls Sitting-balance support: No upper extremity supported;Feet supported Sitting balance-Leahy Scale: Good     Standing balance support: Bilateral upper extremity supported;During functional activity Standing balance-Leahy Scale: Poor Standing balance comment: relies on RW for balance.                           ADL either performed or assessed with clinical judgement   ADL Overall ADL's : Needs assistance/impaired                                             Vision Baseline Vision/History: Wears glasses Wears Glasses: Reading only Patient Visual Report: No change from baseline Vision Assessment?: No apparent visual deficits     Perception     Praxis      Pertinent Vitals/Pain Pain Assessment: 0-10 Pain Score: 8  Pain Location: back pain Pain Descriptors / Indicators: Burning;Aching;Grimacing;Guarding Pain Intervention(s): Limited activity within patient's tolerance;Monitored during session;Premedicated before session;RN gave pain meds during session     Hand Dominance Right   Extremity/Trunk Assessment Upper Extremity Assessment Upper Extremity Assessment: Overall WFL for tasks assessed   Lower Extremity Assessment Lower Extremity Assessment: Defer to PT evaluation   Cervical / Trunk Assessment Cervical / Trunk Assessment: Normal   Communication Communication Communication: No difficulties   Cognition Arousal/Alertness: Awake/alert Behavior During Therapy: WFL for tasks assessed/performed Overall Cognitive Status: Within Functional Limits for tasks assessed  General Comments  Pt's husband in room during session, received all education    Exercises     Shoulder Instructions      Home Living Family/patient expects to be discharged to:: Private residence Living Arrangements: Spouse/significant other;Children Available  Help at Discharge: Family;Available 24 hours/day Type of Home: Apartment Home Access: Level entry     Home Layout: One level     Bathroom Shower/Tub: Tub/shower unit;Curtain   Biochemist, clinical: Standard Bathroom Accessibility: Yes How Accessible: Accessible via walker Home Equipment: Cane - single point (bed rail)          Prior Functioning/Environment Level of Independence: Independent with assistive device(s)        Comments: used cane PTA and Independent with ADL        OT Problem List: Decreased strength;Decreased range of motion;Decreased activity tolerance;Impaired balance (sitting and/or standing);Decreased knowledge of use of DME or AE;Decreased knowledge of precautions;Pain      OT Treatment/Interventions: Self-care/ADL training;DME and/or AE instruction;Therapeutic activities;Patient/family education;Balance training    OT Goals(Current goals can be found in the care plan section) Acute Rehab OT Goals Patient Stated Goal: to get better OT Goal Formulation: With patient/family Time For Goal Achievement: 12/06/16 Potential to Achieve Goals: Good ADL Goals Pt Will Perform Upper Body Dressing: with supervision;with caregiver independent in assisting;sitting Pt Will Perform Lower Body Dressing: with supervision;with caregiver independent in assisting;sit to/from stand;with adaptive equipment Pt Will Transfer to Toilet: with supervision;bedside commode;ambulating (with RW) Pt Will Perform Toileting - Clothing Manipulation and hygiene: with modified independence;sit to/from stand Additional ADL Goal #1: Pt will be able to state 3/3 back precautions and maintain during ADL Additional ADL Goal #2: Pt will perform bed mobility as a precursor to ADL at supervision level with one or less verbal cues.   OT Frequency: Min 2X/week   Barriers to D/C:            Co-evaluation PT/OT/SLP Co-Evaluation/Treatment: Yes Reason for Co-Treatment: For patient/therapist safety;To  address functional/ADL transfers PT goals addressed during session: Mobility/safety with mobility OT goals addressed during session: ADL's and self-care      End of Session Equipment Utilized During Treatment: Gait belt;Rolling walker;Back brace Nurse Communication: Mobility status;Patient requests pain meds;Precautions (in room administering/providing pain medicine)  Activity Tolerance: Patient tolerated treatment well Patient left: in chair;with call bell/phone within reach;with family/visitor present;with nursing/sitter in room  OT Visit Diagnosis: Unsteadiness on feet (R26.81);Muscle weakness (generalized) (M62.81);Pain Pain - Right/Left: Right (midline) Pain - part of body: Leg (back)                Time: 1112-1140 OT Time Calculation (min): 28 min Charges:  OT General Charges $OT Visit: 1 Procedure OT Evaluation $OT Eval Moderate Complexity: 1 Procedure G-Codes:     Hulda Humphrey OTR/L [place] 11/22/2016, 2:09 PM

## 2016-11-22 NOTE — Evaluation (Signed)
Physical Therapy Evaluation Patient Details Name: Joyce [name] MRN: [id number] DOB: [birth year] Today's Date: 11/22/2016   History of Present Illness  S/P Left lateral 3/4 fusion/removal of pre-existing hardware and placement of posterior instrumentation for spinal stenosis and BIL leg pain.   Clinical Impression  Pt admitted with above diagnosis. Pt currently with functional limitations due to the deficits listed below (see PT Problem List). Pt was able to ambulate with RW with min to min guard assist. Pt very safe.  Total assist to don brace as first time per pt.  Will follow acutely.  Should progresswell.   Pt will benefit from skilled PT to increase their independence and safety with mobility to allow discharge to the venue listed below.      Follow Up Recommendations Home health PT;Supervision/Assistance - 24 hour    Equipment Recommendations  Rolling walker with 5" wheels;3in1 (PT)    Recommendations for Other Services       Precautions / Restrictions Precautions Precautions: Fall;Back Precaution Booklet Issued: Yes (comment) Required Braces or Orthoses: Spinal Brace Spinal Brace: Thoracolumbosacral orthotic;Applied in sitting position;Applied in standing position Restrictions Weight Bearing Restrictions: No      Mobility  Bed Mobility Overal bed mobility: Needs Assistance Bed Mobility: Rolling;Sidelying to Sit Rolling: Min guard Sidelying to sit: Min assist;+2 for physical assistance;Mod assist       General bed mobility comments: used pad to assist pt with positioning for log roll and to keep in alignment. Mod assist at times with pad and for elevation of trunk.   Transfers Overall transfer level: Needs assistance Equipment used: Rolling walker (2 wheeled) Transfers: Sit to/from [facility] Sit to Stand: Min guard;+2 safety/equipment Stand pivot transfers: Min guard;+2 safety/equipment       General transfer comment: Pt steadying assist only to  come to stand from bed and from 3N1 with incr time needed.   Ambulation/Gait Ambulation/Gait assistance: Min assist Ambulation Distance (Feet): 20 Feet Assistive device: Rolling walker (2 wheeled) Gait Pattern/deviations: Decreased step length - right;Decreased step length - left;Step-through pattern;Decreased stride length;Wide base of support   Gait velocity interpretation: Below normal speed for age/gender General Gait Details: Pt safe with ambulation with RW.  No LOB withguarded slow gait  Stairs            Wheelchair Mobility    Modified Rankin (Stroke Patients Only)       Balance Overall balance assessment: Needs assistance;History of Falls Sitting-balance support: No upper extremity supported;Feet supported Sitting balance-Leahy Scale: Good     Standing balance support: Bilateral upper extremity supported;During functional activity Standing balance-Leahy Scale: Poor Standing balance comment: relies on RW for balance.                             Pertinent Vitals/Pain Pain Assessment: 0-10 Pain Score: 8  Pain Location: back pain Pain Descriptors / Indicators: Burning;Aching;Grimacing;Guarding Pain Intervention(s): Limited activity within patient's tolerance;Monitored during session;Premedicated before session;RN gave pain meds during session    [place] expects to be discharged to:: Private residence Living Arrangements: Spouse/significant other;Children Available Help at Discharge: Family;Available 24 hours/day Type of Home: Apartment Home Access: Level entry     Home Layout: One level Home Equipment: Cane - single point (bed rail)      Prior Function Level of Independence: Independent with assistive device(s)         Comments: used cane PTA and I with B/D  Hand Dominance   Dominant Hand: Right    Extremity/Trunk Assessment   Upper Extremity Assessment Upper Extremity Assessment: Defer to OT evaluation     Lower Extremity Assessment Lower Extremity Assessment: Generalized weakness    Cervical / Trunk Assessment Cervical / Trunk Assessment: Normal  Communication   Communication: No difficulties  Cognition Arousal/Alertness: Awake/alert Behavior During Therapy: WFL for tasks assessed/performed Overall Cognitive Status: Within Functional Limits for tasks assessed                                        General Comments      Exercises     Assessment/Plan    PT Assessment Patient needs continued PT services  PT Problem List Decreased strength;Decreased activity tolerance;Decreased balance;Decreased mobility;Decreased knowledge of use of DME;Decreased safety awareness;Pain       PT Treatment Interventions DME instruction;Gait training;Functional mobility training;Therapeutic activities;Therapeutic exercise;Patient/family education    PT Goals (Current goals can be found in the Care Plan section)  Acute Rehab PT Goals Patient Stated Goal: to get better PT Goal Formulation: With patient Time For Goal Achievement: 12/06/16 Potential to Achieve Goals: Good    Frequency Min 6X/week   Barriers to discharge        Co-evaluation PT/OT/SLP Co-Evaluation/Treatment: Yes Reason for Co-Treatment: For patient/therapist safety PT goals addressed during session: Mobility/safety with mobility         End of Session Equipment Utilized During Treatment: Gait belt;Back brace Activity Tolerance: Patient limited by fatigue;Patient limited by pain Patient left: in chair;with call bell/phone within reach;with family/visitor present Nurse Communication: Mobility status PT Visit Diagnosis: Unsteadiness on feet (R26.81);Muscle weakness (generalized) (M62.81);Pain Pain - part of body:  (back)    Time: 1438-8875 PT Time Calculation (min) (ACUTE ONLY): 28 min   Charges:   PT Evaluation $PT Eval Moderate Complexity: 1 Procedure PT Treatments $Gait Training: 8-22 mins    PT G Codes:        Joyce [name],PT Acute Rehabilitation [phone] (pager)   Denice Paradise 11/22/2016, 1:18 PM

## 2016-11-22 NOTE — Care Management Note (Signed)
Case Management Note  Patient Details  Name: Joyce [name] MRN: [id number] Date of Birth: 1955-10-29  Subjective/Objective:    Pt s/p lumbar surgery. She is from home with her spouse.                Action/Plan: PT recommending [facility] services. CM met with the patient and provided her a list of [facility] agencies. She would like [facility]. Will need orders from MD.  Pt with orders for walker and 3 in 1. Butch Penny with [facility] notified and will have the equipment delivered to the room.   Expected Discharge Date:  11/23/16               Expected Discharge Plan:  [place]  In-House Referral:     Discharge planning Services  CM Consult  Post Acute Care Choice:  Durable Medical Equipment, Home Health Choice offered to:  Patient  DME Arranged:  3-N-1, Walker rolling DME Agency:  [place]:    [facility] Agency:  [facility]  Status of Service:  In process, will continue to follow  If discussed at Long Length of Stay Meetings, dates discussed:    Additional Comments:  Pollie Friar, RN 11/22/2016, 4:47 PM

## 2016-11-22 NOTE — Op Note (Signed)
NAME:  Joyce [name], Joyce [name]                       ACCOUNT NO.:  MEDICAL RECORD NO.:  [id number]  LOCATION:                                 FACILITY:  PHYSICIAN:  Phylliss Bob, MD      DATE OF BIRTH:  Jul 05, 1956  DATE OF PROCEDURE:  11/21/2016                              OPERATIVE REPORT   PREOPERATIVE DIAGNOSIS:  Status post previous lateral L3-4 fusion, requiring posterior fusion and fixation.  POSTOPERATIVE DIAGNOSIS:  Status post previous lateral L3-4 fusion, requiring posterior fusion and fixation.  PROCEDURE (stage 2 of 2): 1. Posterior spinal fusion at L3-4. 2. Placement of posterior nonsegmental instrumentation, L3, bilateral. 3. Removal and reinsertion of spinal fixation, L4, bilateral. 4. Removal without reinsertion of fixation, L5, S1, bilateral. 5. Exploration of posterior spinal fusion, L4-5, L5-S1. 6. Use of morselized allograft-DBS mix. 7. Intraoperative use of fluoroscopy.  SURGEON:  Phylliss Bob, MD  ASSISTANT:  Pricilla Holm, PA-C.  ANESTHESIA:  General endotracheal anesthesia.  COMPLICATIONS:  None.  DISPOSITION:  Stable.  ESTIMATED BLOOD LOSS:  100 mL.  INDICATIONS FOR SURGERY:  Briefly, Joyce [name] is a very pleasant 61 year old female who did undergo lateral fusion procedure at L3-4, the day prior.  The patient did present today for stage II what was to be a two- staged procedure.  Please refer to my operative report dated November 20, 2016, for full account of the patient's indications for surgery.  OPERATIVE DETAILS:  On November 21, 2016, the patient was brought to surgery and general endotracheal anesthesia was administered.  The patient was placed prone on a well-padded flat Jackson bed with a spinal frame.  Antibiotics were given.  The back was prepped and draped in the usual fashion.  A time-out procedure was performed.  I then brought in fluoroscopy and marked out the L4, L5, and S1 instrumentation. Paramedian incisions were then made on the right and  left sides, just lateral to the previously placed instrumentation.  A Wiltse approach was used bilaterally.  A previously identified hardware was identified and was removed.  Of note, this portion of the procedure was extremely meticulous, as there were multiple aspects of the hardware that were overgrown with bone and that were very difficult to remove, it did take approximately 90 minutes, however, I was able to safely remove the bilateral pedicle screws at L4, L5, and S1 with associated instrumentation.  At L5 and S1, bone wax was placed in place of the removed pedicle screws.  At L4 bilaterally, I did use a 6 mm tap and did replace the removed screws with 7 x 40 mm screws.  The screw on the left that was initially placed many years ago was slightly long, and so I did make both screws a bit shorter, using 7 x 40 mm screws.  Of note, I did explore the fusion at L4-5 and L5-S1.  Prior to removing the screws, I did perform a pushing and pulling maneuver to ensure that the levels were adequately fused, and there was no motion identified.  I then cannulated the L3 pedicles using fluoroscopy.  I then used a 6 mm tap, which was  advanced to 40 mm.  I was very pleased with the press-fit of the tap.  I then identified the bilateral L3-4 facet joints.  A high- speed bur was used to decorticate the facet joint and posterolateral gutters.  ViviGen was then placed into the facet joints and posterolateral gutters bilaterally, in order to aid in the success of the posterolateral fusion.  I then placed 40 mm rods bilaterally across the L3-4 construct.  Caps were placed and a final locking procedure was performed on the right and left sides.  I was very pleased with the final AP and lateral fluoroscopic images.  The wound was copiously irrigated throughout the surgery.  The wound was then closed in layers using #1 Vicryl followed by 0 Vicryl followed by 4-0 Monocryl.  Benzoin and Steri-Strips were  applied followed by sterile dressing.  All instrument counts were correct at the termination of the procedure.  Of note, Pricilla Holm was my assistant throughout surgery, and did aid in retraction, suctioning, and closure from start to finish.     Phylliss Bob, MD     MD/MEDQ  D:  11/21/2016  T:  11/21/2016  Job:  [id number]

## 2016-11-22 NOTE — Progress Notes (Addendum)
    Patient doing well PO day 2/1 S/P Left lateral 3/4 fusion/removal of pre-existing hardware and placement of posterior instrumentation for spinal stenosis and BIL leg pain. Pt doing well with improved pre-op BIL leg pain. Expected PO LBP, currently rated 7/10.    Physical Exam: BP (!) 168/46 (BP Location: Right Arm)   Pulse 72   Temp 98.6 F (37 C) (Oral)   Resp 16   Ht 5\' 7"  (1.702 m)   Wt 97.5 kg (215 lb)   LMP 12/04/1994   SpO2 99%   BMI 33.67 kg/m   Dressing in place left lateral and posterior, CDI, SCD's in place, pt appears comfortable. She is sitting in a chair NVI  POD #2/1 s/p  Left lateral 3/4 fusion/removal of pre-existing hardware and placement of posterior instrumentation with improved pre-op BIL leg pain and moderate LBP  - up with PT/OT, encourage ambulation  -TLSO brace at all times when out of bed, can be applied sitting or standing, upper straps to connect to top of posterior upper support bar and go under arms  - Percocet for pain, Valium for muscle spasms - will start oxycontin 20mg  BID, which may not be needed upon home discharge - likely d/c home  Tomorrow - reinforce dressing

## 2016-11-22 NOTE — Progress Notes (Signed)
Dr. Laurena Bering office called to notify temp-102.5 (oral), & 101.8 (ax) with no PRN order for Tylenol.  Secretary took message & stated she is sending info to MD.

## 2016-11-23 NOTE — Progress Notes (Signed)
Occupational Therapy Treatment Patient Details Name: DELAINY MCELHINEY MRN: [id number] DOB: 1956/03/11 Today's Date: 11/23/2016    History of present illness S/P Left lateral 3/4 fusion/removal of pre-existing hardware and placement of posterior instrumentation for spinal stenosis and BIL leg pain.    OT comments  Pt. Making gains with skilled OT.  Able to complete bed mobility, donning brace, and toileting tasks.   Husband available to assist as needed.  Note d/c later today.    Follow Up Recommendations  No OT follow up;Supervision/Assistance - 24 hour    Equipment Recommendations  3 in 1 bedside commode    Recommendations for Other Services      Precautions / Restrictions Precautions Precautions: Fall;Back Required Braces or Orthoses: Spinal Brace Spinal Brace: Thoracolumbosacral orthotic;Applied in sitting position;Applied in standing position       Mobility Bed Mobility Overal bed mobility: Needs Assistance Bed Mobility: Rolling;Sidelying to Sit Rolling: Supervision Sidelying to sit: Supervision       General bed mobility comments: hob flat, no rails, exists on R side  Transfers Overall transfer level: Needs assistance Equipment used: Rolling walker (2 wheeled) Transfers: Sit to/from [employer] Transfers Sit to Stand: Supervision Stand pivot transfers: Supervision            Balance                                           ADL either performed or assessed with clinical judgement   ADL Overall ADL's : Needs assistance/impaired                 Upper Body Dressing : Minimal assistance;Standing Upper Body Dressing Details (indicate cue type and reason): don brace in standing   Lower Body Dressing Details (indicate cue type and reason): husband can assist, pt. also reports she will be purchasing a Writer: Supervision/safety;RW;Ambulation       Tub/ Banker: Tub transfer;Minimal Quarry manager Details (indicate cue type and reason): unable to side step over tub with LLE, states her L knee has been bothering her for a while, will sponge bathe until she "feels stronger with LLE" Functional mobility during ADLs: Min guard       Vision       Perception     Praxis      Cognition Arousal/Alertness: Awake/alert Behavior During Therapy: WFL for tasks assessed/performed Overall Cognitive Status: Within Functional Limits for tasks assessed                                          Exercises     Shoulder Instructions       General Comments      Pertinent Vitals/ Pain       Pain Assessment: No/denies pain  Home Living                                          Prior Functioning/Environment              Frequency  Min 2X/week        Progress Toward Goals  OT Goals(current goals can now be found in the care plan section)  Progress  towards OT goals: Progressing toward goals     Plan Discharge plan remains appropriate    Co-evaluation                 End of Session Equipment Utilized During Treatment: Gait belt;Rolling walker;Back brace  OT Visit Diagnosis: Unsteadiness on feet (R26.81);Muscle weakness (generalized) (M62.81);Pain Pain - Right/Left: Right Pain - part of body: Leg   Activity Tolerance Patient tolerated treatment well   Patient Left in chair   Nurse Communication Mobility status        Time: 5397-6734 OT Time Calculation (min): 24 min  Charges: OT General Charges $OT Visit: 1 Procedure OT Treatments $Self Care/Home Management : 23-37 mins  Janice Coffin, COTA/L 11/23/2016, 9:07 AM

## 2016-11-23 NOTE — Progress Notes (Signed)
PATIENT ID: Joyce [name]  MRN: [id number]  DOB/AGE:  1956/02/15 / 61 y.o.  2 Days Post-Op Procedure(s) (LRB): Revision Lumbar four through sacral one hardware extending to lumbar three and four with instrumentation and hardware (N/A)    PROGRESS NOTE Subjective:   Patient is alert, oriented, no Nausea, no Vomiting, yes passing gas, no Bowel Movement. Taking PO well. Denies SOB, Chest or Calf Pain. Using Incentive Spirometer, PAS in place. Ambulate WBAT with walker, Patient reports pain as 4 on 0-10 scale,     Objective: Vital signs in last 24 hours: Temp:  [99.2 F (37.3 C)-102.5 F (39.2 C)] 99.4 F (37.4 C) (04/21 0400) Pulse Rate:  [62-81] 70 (04/21 0400) Resp:  [14-18] 14 (04/21 0400) BP: (128-215)/(46-74) 137/50 (04/21 0400) SpO2:  [98 %-100 %] 98 % (04/21 0400)    Intake/Output from previous day: I/O last 3 completed shifts: In: 1940 [P.O.:1620; I.V.:120; IV Piggyback:200] Out: 1400 [Urine:1400]   Intake/Output this shift: No intake/output data recorded.   LABORATORY DATA: No results for input(s): WBC, HGB, HCT, PLT, NA, K, CL, CO2, BUN, CREATININE, GLUCOSE, GLUCAP, INR, CALCIUM in the last 72 hours.  Invalid input(s): PT, 2  Examination: Neurologically intact Neurovascular intact Sensation intact distally Intact pulses distally Dorsiflexion/Plantar flexion intact Incision: dressing C/D/I No cellulitis present}  Assessment:   2 Days Post-Op Procedure(s) (LRB): Revision Lumbar four through sacral one hardware extending to lumbar three and four with instrumentation and hardware (N/A) ADDITIONAL DIAGNOSIS:  depression  Plan:  - up with PT/OT, encourage ambulation             -TLSO brace at all times when out of bed, can be applied sitting or standing, upper straps to connect to top of posterior upper support bar and go under arms  - Percocet for pain, Valium for muscle spasms - will start oxycontin 20mg  BID, rx provided for home discharge -  d/c home today -  reinforce dressing    Jamile Sivils R 11/23/2016, 8:17 AM
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
## 2016-11-23 NOTE — Progress Notes (Addendum)
LM w after hours service to get F2F and order for [facility] PT. [phone]. Referral made to Carlos with [facility] at [phone]. Referral accepted.

## 2016-11-25 LAB — TYPE AND SCREEN
ABO/RH(D): O POS
ANTIBODY SCREEN: NEGATIVE
UNIT DIVISION: 0
Unit division: 0

## 2016-11-25 LAB — BPAM RBC
Blood Product Expiration Date: 201805112359
Blood Product Expiration Date: 201805112359
ISSUE DATE / TIME: 201804180805
ISSUE DATE / TIME: 201804180805
Unit Type and Rh: 5100
Unit Type and Rh: 5100

## 2017-01-03 NOTE — Addendum Note (Signed)
Addendum  created 01/03/17 1249 by Effie Berkshire, MD   Sign clinical note

## 2017-03-04 ENCOUNTER — Ambulatory Visit (HOSPITAL_COMMUNITY): Payer: Medicare Other | Admitting: Physical Therapy

## 2017-03-11 ENCOUNTER — Ambulatory Visit (HOSPITAL_COMMUNITY): Payer: Medicare Other | Attending: Orthopedic Surgery | Admitting: Physical Therapy

## 2017-03-11 ENCOUNTER — Encounter (HOSPITAL_COMMUNITY): Payer: Self-pay | Admitting: Physical Therapy

## 2017-03-11 DIAGNOSIS — G8929 Other chronic pain: Secondary | ICD-10-CM

## 2017-03-11 DIAGNOSIS — M6281 Muscle weakness (generalized): Secondary | ICD-10-CM | POA: Insufficient documentation

## 2017-03-11 DIAGNOSIS — R2681 Unsteadiness on feet: Secondary | ICD-10-CM | POA: Diagnosis present

## 2017-03-11 DIAGNOSIS — M545 Low back pain: Secondary | ICD-10-CM | POA: Insufficient documentation

## 2017-03-11 DIAGNOSIS — R262 Difficulty in walking, not elsewhere classified: Secondary | ICD-10-CM | POA: Diagnosis present
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
## 2017-03-12 ENCOUNTER — Telehealth (HOSPITAL_COMMUNITY): Payer: Self-pay | Admitting: Physical Therapy

## 2017-03-12 ENCOUNTER — Ambulatory Visit (HOSPITAL_COMMUNITY): Payer: Medicare Other | Admitting: Physical Therapy

## 2017-03-12 NOTE — Telephone Encounter (Signed)
Patient called to cancel her appt she is going out of town

## 2017-03-17 ENCOUNTER — Ambulatory Visit (HOSPITAL_COMMUNITY): Payer: Medicare Other | Admitting: Physical Therapy

## 2017-03-17 DIAGNOSIS — R262 Difficulty in walking, not elsewhere classified: Secondary | ICD-10-CM

## 2017-03-17 DIAGNOSIS — M545 Low back pain: Secondary | ICD-10-CM | POA: Diagnosis not present

## 2017-03-17 DIAGNOSIS — G8929 Other chronic pain: Secondary | ICD-10-CM

## 2017-03-17 DIAGNOSIS — R2681 Unsteadiness on feet: Secondary | ICD-10-CM

## 2017-03-17 DIAGNOSIS — M6281 Muscle weakness (generalized): Secondary | ICD-10-CM
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
## 2017-03-17 NOTE — Patient Instructions (Signed)
      Hip extension isometric  Lay on your back [place] your Right knee and straighten your left leg; push your left heel and back of your left leg straight down onto the table.  Hold for 3-5 seconds.  Repeat at least 10 times each leg, twice a day.     HIP ABDUCTION - STANDING   While standing, raise your leg out to the side. Keep your knee straight and maintain your toes pointed forward the entire time.    It should feel like you are leading with your heel.  Hold for 2-3 seconds, then relax.  Repeat 10 times each leg, twice a day.

## 2017-03-19 ENCOUNTER — Ambulatory Visit (HOSPITAL_COMMUNITY): Payer: Medicare Other

## 2017-03-19 DIAGNOSIS — M6281 Muscle weakness (generalized): Secondary | ICD-10-CM

## 2017-03-19 DIAGNOSIS — R2681 Unsteadiness on feet: Secondary | ICD-10-CM

## 2017-03-19 DIAGNOSIS — M545 Low back pain: Secondary | ICD-10-CM | POA: Diagnosis not present

## 2017-03-19 DIAGNOSIS — R262 Difficulty in walking, not elsewhere classified: Secondary | ICD-10-CM

## 2017-03-19 DIAGNOSIS — G8929 Other chronic pain: Secondary | ICD-10-CM
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
## 2017-03-24 ENCOUNTER — Telehealth (HOSPITAL_COMMUNITY): Payer: Self-pay | Admitting: Family Medicine

## 2017-03-24 ENCOUNTER — Ambulatory Visit (HOSPITAL_COMMUNITY): Payer: Medicare Other | Admitting: Physical Therapy

## 2017-03-24 NOTE — Telephone Encounter (Signed)
03/24/17  pt called to [facility] said she wasn't feeling well this morning

## 2017-03-26 ENCOUNTER — Telehealth (HOSPITAL_COMMUNITY): Payer: Self-pay | Admitting: Family Medicine

## 2017-03-26 ENCOUNTER — Ambulatory Visit (HOSPITAL_COMMUNITY): Payer: Medicare Other | Admitting: Physical Therapy

## 2017-03-26 NOTE — Telephone Encounter (Signed)
03/26/17  Had issue with [facility] - they didn't pick her up

## 2017-03-31 ENCOUNTER — Ambulatory Visit (HOSPITAL_COMMUNITY): Payer: Medicare Other | Admitting: Physical Therapy

## 2017-03-31 DIAGNOSIS — M545 Low back pain: Secondary | ICD-10-CM | POA: Diagnosis not present

## 2017-03-31 DIAGNOSIS — R262 Difficulty in walking, not elsewhere classified: Secondary | ICD-10-CM

## 2017-03-31 DIAGNOSIS — G8929 Other chronic pain: Secondary | ICD-10-CM

## 2017-03-31 DIAGNOSIS — R2681 Unsteadiness on feet: Secondary | ICD-10-CM

## 2017-03-31 DIAGNOSIS — M6281 Muscle weakness (generalized): Secondary | ICD-10-CM

## 2017-03-31 NOTE — Patient Instructions (Addendum)
   Transverse Abdominus Activation  Lying on your back, pull your bellybutton into your spine.   It should feel like you are clenching your stomach muscles and sucking your belly button to your backbone.  Repeat 20 times with 3 second holds, at least 5 times per day.      GLUTE SET - SUPINE  While lying on your back, squeeze your buttocks and hold for at least 3 seconds.  Repeat 20 times, at least 5 times per day.    SUPINE HIP ABDUCTION - ELASTIC BAND CLAMS  Lie down on your back with your knees bent. Place an elastic band around your knees and then draw your knees apart.  Hold for 2 seconds.  Repeat 10-15 times, twice a day.
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
## 2017-04-02 ENCOUNTER — Ambulatory Visit (HOSPITAL_COMMUNITY): Payer: Medicare Other | Admitting: Physical Therapy

## 2017-04-02 ENCOUNTER — Telehealth (HOSPITAL_COMMUNITY): Payer: Self-pay | Admitting: Family Medicine

## 2017-04-02 NOTE — Telephone Encounter (Signed)
04/02/17  she said that she can't move, she can't get out of bed and will call her dr.

## 2017-04-14 ENCOUNTER — Ambulatory Visit (HOSPITAL_COMMUNITY): Payer: Medicare Other | Attending: Orthopedic Surgery | Admitting: Physical Therapy

## 2017-04-14 DIAGNOSIS — R2681 Unsteadiness on feet: Secondary | ICD-10-CM

## 2017-04-14 DIAGNOSIS — R262 Difficulty in walking, not elsewhere classified: Secondary | ICD-10-CM | POA: Diagnosis present

## 2017-04-14 DIAGNOSIS — G8929 Other chronic pain: Secondary | ICD-10-CM | POA: Insufficient documentation

## 2017-04-14 DIAGNOSIS — M545 Low back pain: Secondary | ICD-10-CM | POA: Insufficient documentation

## 2017-04-14 DIAGNOSIS — M6281 Muscle weakness (generalized): Secondary | ICD-10-CM | POA: Insufficient documentation
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
## 2017-04-16 ENCOUNTER — Ambulatory Visit (HOSPITAL_COMMUNITY): Payer: Medicare Other | Admitting: Physical Therapy

## 2017-04-16 ENCOUNTER — Encounter (HOSPITAL_COMMUNITY): Payer: Self-pay | Admitting: Physical Therapy

## 2017-04-16 DIAGNOSIS — R2681 Unsteadiness on feet: Secondary | ICD-10-CM

## 2017-04-16 DIAGNOSIS — M6281 Muscle weakness (generalized): Secondary | ICD-10-CM

## 2017-04-16 DIAGNOSIS — M545 Low back pain, unspecified: Secondary | ICD-10-CM

## 2017-04-16 DIAGNOSIS — R262 Difficulty in walking, not elsewhere classified: Secondary | ICD-10-CM

## 2017-04-16 DIAGNOSIS — G8929 Other chronic pain: Secondary | ICD-10-CM
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
## 2017-04-21 ENCOUNTER — Ambulatory Visit (HOSPITAL_COMMUNITY): Payer: Medicare Other | Admitting: Physical Therapy

## 2017-04-21 ENCOUNTER — Encounter (HOSPITAL_COMMUNITY): Payer: Self-pay | Admitting: Physical Therapy

## 2017-04-21 DIAGNOSIS — R2681 Unsteadiness on feet: Secondary | ICD-10-CM

## 2017-04-21 DIAGNOSIS — R262 Difficulty in walking, not elsewhere classified: Secondary | ICD-10-CM

## 2017-04-21 DIAGNOSIS — G8929 Other chronic pain: Secondary | ICD-10-CM

## 2017-04-21 DIAGNOSIS — M545 Low back pain, unspecified: Secondary | ICD-10-CM

## 2017-04-21 DIAGNOSIS — M6281 Muscle weakness (generalized): Secondary | ICD-10-CM

## 2017-04-21 NOTE — Patient Instructions (Signed)
   ELASTIC BAND - SIDELYING CLAM-   While lying on your side with your knees bent and an elastic band wrapped around your knees, draw up the top knee while keeping contact of your feet together as shown.   Do not let your pelvis roll back during the lifting movement.    Repeat 5 times each side, twice a day.   TRUNK STRENGTH MATRIX  1) Sitting nice and tall with your arms across your chest or in lap, bend forward at your hips and then use your back muscles to pull yourself back up straight.   Repeat 10 times, twice a day.   2) Sitting nice and tall with your arms across your chest or in your lap, bend sideways at your hips and then use the core muscles on your side to pull yourself back up straight.  Repeat 10 times each way, twice a day.     3) Sitting nice and tall with your arms across your chest or in your lap, rotate as far one direction as you can tolerate, then use your core muscles to rotate yourself back forward.  Repeat 10 times each way, twice a day.
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
## 2017-04-23 ENCOUNTER — Encounter (HOSPITAL_COMMUNITY): Payer: Self-pay | Admitting: Physical Therapy

## 2017-04-23 ENCOUNTER — Ambulatory Visit (HOSPITAL_COMMUNITY): Payer: Medicare Other | Admitting: Physical Therapy

## 2017-04-23 DIAGNOSIS — M545 Low back pain, unspecified: Secondary | ICD-10-CM

## 2017-04-23 DIAGNOSIS — R2681 Unsteadiness on feet: Secondary | ICD-10-CM

## 2017-04-23 DIAGNOSIS — M6281 Muscle weakness (generalized): Secondary | ICD-10-CM

## 2017-04-23 DIAGNOSIS — G8929 Other chronic pain: Secondary | ICD-10-CM

## 2017-04-23 DIAGNOSIS — R262 Difficulty in walking, not elsewhere classified: Secondary | ICD-10-CM
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
## 2017-04-28 ENCOUNTER — Ambulatory Visit (HOSPITAL_COMMUNITY): Payer: Medicare Other | Admitting: Physical Therapy

## 2017-04-28 ENCOUNTER — Encounter (HOSPITAL_COMMUNITY): Payer: Self-pay | Admitting: Physical Therapy

## 2017-04-28 ENCOUNTER — Encounter (HOSPITAL_COMMUNITY): Payer: Medicare Other | Admitting: Physical Therapy

## 2017-04-28 DIAGNOSIS — G8929 Other chronic pain: Secondary | ICD-10-CM

## 2017-04-28 DIAGNOSIS — R262 Difficulty in walking, not elsewhere classified: Secondary | ICD-10-CM

## 2017-04-28 DIAGNOSIS — M545 Low back pain: Principal | ICD-10-CM

## 2017-04-28 DIAGNOSIS — M6281 Muscle weakness (generalized): Secondary | ICD-10-CM

## 2017-04-28 DIAGNOSIS — R2681 Unsteadiness on feet: Secondary | ICD-10-CM
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
## 2017-04-29 ENCOUNTER — Ambulatory Visit (HOSPITAL_COMMUNITY): Payer: Medicare Other

## 2017-04-30 ENCOUNTER — Encounter (HOSPITAL_COMMUNITY): Payer: Medicare Other

## 2017-05-01 ENCOUNTER — Encounter (HOSPITAL_COMMUNITY): Payer: Self-pay

## 2017-05-01 ENCOUNTER — Ambulatory Visit (HOSPITAL_COMMUNITY): Payer: Medicare Other

## 2017-05-01 DIAGNOSIS — G8929 Other chronic pain: Secondary | ICD-10-CM

## 2017-05-01 DIAGNOSIS — M545 Low back pain, unspecified: Secondary | ICD-10-CM

## 2017-05-01 DIAGNOSIS — M6281 Muscle weakness (generalized): Secondary | ICD-10-CM

## 2017-05-01 DIAGNOSIS — R262 Difficulty in walking, not elsewhere classified: Secondary | ICD-10-CM

## 2017-05-01 DIAGNOSIS — R2681 Unsteadiness on feet: Secondary | ICD-10-CM
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
## 2017-05-05 ENCOUNTER — Encounter (HOSPITAL_COMMUNITY): Payer: Medicare Other | Admitting: Physical Therapy

## 2017-05-05 ENCOUNTER — Ambulatory Visit (HOSPITAL_COMMUNITY): Payer: Medicare Other | Admitting: Physical Therapy

## 2017-05-05 ENCOUNTER — Telehealth (HOSPITAL_COMMUNITY): Payer: Self-pay | Admitting: Family Medicine

## 2017-05-05 NOTE — Telephone Encounter (Signed)
05/05/17  wife left a message to cx saying that their apartment complex was renovating and the [facility]i can't get to them because the parking lot is blocked off and they can't walk up the hill to meet the [employer]

## 2017-05-06 ENCOUNTER — Encounter (HOSPITAL_COMMUNITY): Payer: Medicare Other

## 2017-05-07 ENCOUNTER — Encounter (HOSPITAL_COMMUNITY): Payer: Medicare Other | Admitting: Physical Therapy

## 2017-05-08 ENCOUNTER — Ambulatory Visit (HOSPITAL_COMMUNITY): Payer: Medicare Other | Attending: Orthopedic Surgery

## 2017-05-08 ENCOUNTER — Encounter (HOSPITAL_COMMUNITY): Payer: Self-pay

## 2017-05-08 DIAGNOSIS — R2681 Unsteadiness on feet: Secondary | ICD-10-CM | POA: Insufficient documentation

## 2017-05-08 DIAGNOSIS — M545 Low back pain, unspecified: Secondary | ICD-10-CM

## 2017-05-08 DIAGNOSIS — R262 Difficulty in walking, not elsewhere classified: Secondary | ICD-10-CM | POA: Insufficient documentation

## 2017-05-08 DIAGNOSIS — G8929 Other chronic pain: Secondary | ICD-10-CM | POA: Insufficient documentation

## 2017-05-08 DIAGNOSIS — M6281 Muscle weakness (generalized): Secondary | ICD-10-CM | POA: Insufficient documentation
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
## 2017-05-13 ENCOUNTER — Encounter (HOSPITAL_COMMUNITY): Payer: Medicare Other

## 2017-05-13 ENCOUNTER — Telehealth (HOSPITAL_COMMUNITY): Payer: Self-pay

## 2017-05-13 NOTE — Telephone Encounter (Signed)
[facility] will not bring her at 3:15 and pick up late.

## 2017-05-15 ENCOUNTER — Ambulatory Visit (HOSPITAL_COMMUNITY): Payer: Medicare Other | Admitting: Physical Therapy

## 2017-05-15 ENCOUNTER — Telehealth (HOSPITAL_COMMUNITY): Payer: Self-pay | Admitting: Physical Therapy

## 2017-05-20 ENCOUNTER — Other Ambulatory Visit (HOSPITAL_COMMUNITY): Payer: Self-pay | Admitting: Family Medicine

## 2017-05-20 ENCOUNTER — Telehealth (HOSPITAL_COMMUNITY): Payer: Self-pay | Admitting: Family Medicine

## 2017-05-20 ENCOUNTER — Ambulatory Visit (HOSPITAL_COMMUNITY): Payer: Medicare Other

## 2017-05-20 DIAGNOSIS — Z1231 Encounter for screening mammogram for malignant neoplasm of breast: Secondary | ICD-10-CM

## 2017-05-20 NOTE — Telephone Encounter (Signed)
05/20/17  pt left a message to cx but no reason was given

## 2017-05-22 ENCOUNTER — Encounter (HOSPITAL_COMMUNITY): Payer: Self-pay | Admitting: Physical Therapy

## 2017-05-22 ENCOUNTER — Ambulatory Visit (HOSPITAL_COMMUNITY): Payer: Medicare Other | Admitting: Physical Therapy

## 2017-05-22 DIAGNOSIS — R262 Difficulty in walking, not elsewhere classified: Secondary | ICD-10-CM

## 2017-05-22 DIAGNOSIS — M545 Low back pain: Secondary | ICD-10-CM | POA: Diagnosis not present

## 2017-05-22 DIAGNOSIS — G8929 Other chronic pain: Secondary | ICD-10-CM

## 2017-05-22 DIAGNOSIS — R2681 Unsteadiness on feet: Secondary | ICD-10-CM

## 2017-05-22 DIAGNOSIS — M6281 Muscle weakness (generalized): Secondary | ICD-10-CM
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
## 2017-06-02 ENCOUNTER — Ambulatory Visit (HOSPITAL_COMMUNITY): Payer: Medicare Other

## 2017-06-04 ENCOUNTER — Ambulatory Visit (HOSPITAL_COMMUNITY)
Admission: RE | Admit: 2017-06-04 | Discharge: 2017-06-04 | Disposition: A | Discharge status: Home / Self Care | Payer: Medicare Other | Source: Ambulatory Visit | Attending: Family Medicine | Admitting: Family Medicine

## 2017-06-04 DIAGNOSIS — Z1231 Encounter for screening mammogram for malignant neoplasm of breast: Secondary | ICD-10-CM

## 2018-01-22 ENCOUNTER — Emergency Department (HOSPITAL_COMMUNITY): Payer: Medicare Other

## 2018-01-22 ENCOUNTER — Inpatient Hospital Stay (HOSPITAL_COMMUNITY)
Admission: EM | Admit: 2018-01-22 | Discharge: 2018-01-29 | DRG: 552 | Disposition: A | Discharge status: Home Health | Payer: Medicare Other | Attending: Family Medicine | Admitting: Family Medicine

## 2018-01-22 ENCOUNTER — Other Ambulatory Visit: Payer: Self-pay

## 2018-01-22 ENCOUNTER — Observation Stay (HOSPITAL_COMMUNITY): Payer: Medicare Other

## 2018-01-22 ENCOUNTER — Encounter (HOSPITAL_COMMUNITY): Payer: Self-pay

## 2018-01-22 DIAGNOSIS — N184 Chronic kidney disease, stage 4 (severe): Secondary | ICD-10-CM | POA: Diagnosis present

## 2018-01-22 DIAGNOSIS — R7989 Other specified abnormal findings of blood chemistry: Secondary | ICD-10-CM | POA: Diagnosis present

## 2018-01-22 DIAGNOSIS — Z6835 Body mass index (BMI) 35.0-35.9, adult: Secondary | ICD-10-CM

## 2018-01-22 DIAGNOSIS — I639 Cerebral infarction, unspecified: Secondary | ICD-10-CM

## 2018-01-22 DIAGNOSIS — M4802 Spinal stenosis, cervical region: Secondary | ICD-10-CM | POA: Diagnosis present

## 2018-01-22 DIAGNOSIS — E782 Mixed hyperlipidemia: Secondary | ICD-10-CM | POA: Diagnosis not present

## 2018-01-22 DIAGNOSIS — M5410 Radiculopathy, site unspecified: Secondary | ICD-10-CM

## 2018-01-22 DIAGNOSIS — Z96651 Presence of right artificial knee joint: Secondary | ICD-10-CM

## 2018-01-22 DIAGNOSIS — R079 Chest pain, unspecified: Secondary | ICD-10-CM

## 2018-01-22 DIAGNOSIS — Z9114 Patient's other noncompliance with medication regimen: Secondary | ICD-10-CM

## 2018-01-22 DIAGNOSIS — M48061 Spinal stenosis, lumbar region without neurogenic claudication: Principal | ICD-10-CM | POA: Diagnosis present

## 2018-01-22 DIAGNOSIS — M549 Dorsalgia, unspecified: Secondary | ICD-10-CM

## 2018-01-22 DIAGNOSIS — Z8249 Family history of ischemic heart disease and other diseases of the circulatory system: Secondary | ICD-10-CM

## 2018-01-22 DIAGNOSIS — I10 Essential (primary) hypertension: Secondary | ICD-10-CM | POA: Diagnosis present

## 2018-01-22 DIAGNOSIS — Z8349 Family history of other endocrine, nutritional and metabolic diseases: Secondary | ICD-10-CM

## 2018-01-22 DIAGNOSIS — M4800 Spinal stenosis, site unspecified: Secondary | ICD-10-CM | POA: Diagnosis present

## 2018-01-22 DIAGNOSIS — M25561 Pain in right knee: Secondary | ICD-10-CM

## 2018-01-22 DIAGNOSIS — R209 Unspecified disturbances of skin sensation: Secondary | ICD-10-CM

## 2018-01-22 DIAGNOSIS — I129 Hypertensive chronic kidney disease with stage 1 through stage 4 chronic kidney disease, or unspecified chronic kidney disease: Secondary | ICD-10-CM | POA: Diagnosis present

## 2018-01-22 DIAGNOSIS — Z91148 Patient's other noncompliance with medication regimen for other reason: Secondary | ICD-10-CM

## 2018-01-22 DIAGNOSIS — Z981 Arthrodesis status: Secondary | ICD-10-CM

## 2018-01-22 DIAGNOSIS — R0789 Other chest pain: Secondary | ICD-10-CM | POA: Diagnosis present

## 2018-01-22 DIAGNOSIS — E663 Overweight: Secondary | ICD-10-CM | POA: Diagnosis present

## 2018-01-22 DIAGNOSIS — G8929 Other chronic pain: Secondary | ICD-10-CM

## 2018-01-22 DIAGNOSIS — G894 Chronic pain syndrome: Secondary | ICD-10-CM | POA: Diagnosis present

## 2018-01-22 DIAGNOSIS — R208 Other disturbances of skin sensation: Secondary | ICD-10-CM | POA: Diagnosis present

## 2018-01-22 DIAGNOSIS — R471 Dysarthria and anarthria: Secondary | ICD-10-CM | POA: Diagnosis present

## 2018-01-22 DIAGNOSIS — Z87891 Personal history of nicotine dependence: Secondary | ICD-10-CM

## 2018-01-22 DIAGNOSIS — G8191 Hemiplegia, unspecified affecting right dominant side: Secondary | ICD-10-CM

## 2018-01-22 DIAGNOSIS — E7800 Pure hypercholesterolemia, unspecified: Secondary | ICD-10-CM

## 2018-01-22 DIAGNOSIS — M5440 Lumbago with sciatica, unspecified side: Secondary | ICD-10-CM

## 2018-01-22 DIAGNOSIS — M5416 Radiculopathy, lumbar region: Secondary | ICD-10-CM | POA: Diagnosis present

## 2018-01-22 DIAGNOSIS — N179 Acute kidney failure, unspecified: Secondary | ICD-10-CM | POA: Diagnosis present

## 2018-01-22 DIAGNOSIS — E785 Hyperlipidemia, unspecified: Secondary | ICD-10-CM | POA: Diagnosis present

## 2018-01-22 HISTORY — DX: Disorder of kidney and ureter, unspecified: N28.9

## 2018-01-22 LAB — TROPONIN I
TROPONIN I: 0.04 ng/mL — AB (ref <0.03)
TROPONIN I: 0.04 ng/mL — AB (ref <0.03)
TROPONIN I: 0.05 ng/mL — AB (ref <0.03)

## 2018-01-22 LAB — URINALYSIS, ROUTINE W REFLEX MICROSCOPIC
BILIRUBIN URINE: NEGATIVE
Glucose, UA: NEGATIVE mg/dL
HGB URINE DIPSTICK: NEGATIVE
KETONES UR: NEGATIVE mg/dL
Leukocytes, UA: NEGATIVE
Nitrite: NEGATIVE
PH: 7 (ref 5.0–8.0)
Protein, ur: NEGATIVE mg/dL
SPECIFIC GRAVITY, URINE: 1.005 (ref 1.005–1.030)

## 2018-01-22 LAB — RAPID URINE DRUG SCREEN, HOSP PERFORMED
AMPHETAMINES: NOT DETECTED
BENZODIAZEPINES: NOT DETECTED
Cocaine: POSITIVE — AB
Opiates: NOT DETECTED
TETRAHYDROCANNABINOL: NOT DETECTED

## 2018-01-22 LAB — COMPREHENSIVE METABOLIC PANEL
ALBUMIN: 3.7 g/dL (ref 3.5–5.0)
ALK PHOS: 103 U/L (ref 38–126)
ALT: 24 U/L (ref 14–54)
ANION GAP: 9 (ref 5–15)
AST: 35 U/L (ref 15–41)
BUN: 37 mg/dL — ABNORMAL HIGH (ref 6–20)
CALCIUM: 9.1 mg/dL (ref 8.9–10.3)
CO2: 24 mmol/L (ref 22–32)
Chloride: 109 mmol/L (ref 101–111)
Creatinine, Ser: 2.37 mg/dL — ABNORMAL HIGH (ref 0.44–1.00)
GFR calc non Af Amer: 21 mL/min — ABNORMAL LOW (ref >60)
GFR, EST AFRICAN AMERICAN: 24 mL/min — AB (ref >60)
GLUCOSE: 96 mg/dL (ref 65–99)
POTASSIUM: 4.1 mmol/L (ref 3.5–5.1)
SODIUM: 142 mmol/L (ref 135–145)
TOTAL PROTEIN: 7.5 g/dL (ref 6.5–8.1)
Total Bilirubin: 0.5 mg/dL (ref 0.3–1.2)

## 2018-01-22 LAB — ETHANOL

## 2018-01-22 LAB — DIFFERENTIAL
Basophils Absolute: 0 10*3/uL (ref 0.0–0.1)
Basophils Relative: 1 %
EOS ABS: 0.4 10*3/uL (ref 0.0–0.7)
EOS PCT: 6 %
Lymphocytes Relative: 34 %
Lymphs Abs: 2.1 10*3/uL (ref 0.7–4.0)
MONO ABS: 0.4 10*3/uL (ref 0.1–1.0)
Monocytes Relative: 6 %
NEUTROS PCT: 53 %
Neutro Abs: 3.3 10*3/uL (ref 1.7–7.7)

## 2018-01-22 LAB — I-STAT CHEM 8, ED
BUN: 36 mg/dL — AB (ref 6–20)
CALCIUM ION: 1.21 mmol/L (ref 1.15–1.40)
Chloride: 107 mmol/L (ref 101–111)
Creatinine, Ser: 2.5 mg/dL — ABNORMAL HIGH (ref 0.44–1.00)
Glucose, Bld: 86 mg/dL (ref 65–99)
HCT: 31 % — ABNORMAL LOW (ref 36.0–46.0)
HEMOGLOBIN: 10.5 g/dL — AB (ref 12.0–15.0)
Potassium: 4.1 mmol/L (ref 3.5–5.1)
SODIUM: 143 mmol/L (ref 135–145)
TCO2: 25 mmol/L (ref 22–32)

## 2018-01-22 LAB — APTT: aPTT: 38 seconds — ABNORMAL HIGH (ref 24–36)

## 2018-01-22 LAB — PROTIME-INR
INR: 0.94
PROTHROMBIN TIME: 12.5 s (ref 11.4–15.2)

## 2018-01-22 LAB — CBC
HCT: 33.3 % — ABNORMAL LOW (ref 36.0–46.0)
Hemoglobin: 10.6 g/dL — ABNORMAL LOW (ref 12.0–15.0)
MCH: 26.8 pg (ref 26.0–34.0)
MCHC: 31.8 g/dL (ref 30.0–36.0)
MCV: 84.1 fL (ref 78.0–100.0)
PLATELETS: 237 10*3/uL (ref 150–400)
RBC: 3.96 MIL/uL (ref 3.87–5.11)
RDW: 13.9 % (ref 11.5–15.5)
WBC: 6.3 10*3/uL (ref 4.0–10.5)

## 2018-01-22 MED ORDER — CLOPIDOGREL BISULFATE 75 MG PO TABS
75.0000 mg | ORAL_TABLET | Freq: Once | ORAL | Status: DC
  Filled 2018-01-22: qty 1

## 2018-01-22 MED ORDER — TRAZODONE HCL 50 MG PO TABS
50.0000 mg | ORAL_TABLET | Freq: Every evening | ORAL | Status: DC | PRN
  Administered 2018-01-22 – 2018-01-28 (×3): 50 mg via ORAL
  Filled 2018-01-22 (×3): qty 1

## 2018-01-22 MED ORDER — ENOXAPARIN SODIUM 30 MG/0.3ML ~~LOC~~ SOLN
30.0000 mg | SUBCUTANEOUS | Status: DC
  Administered 2018-01-22 – 2018-01-25 (×4): 30 mg via SUBCUTANEOUS
  Filled 2018-01-22 (×4): qty 0.3

## 2018-01-22 MED ORDER — FENTANYL CITRATE (PF) 100 MCG/2ML IJ SOLN
25.0000 ug | Freq: Once | INTRAMUSCULAR | Status: AC
  Administered 2018-01-22: 25 ug via INTRAVENOUS
  Filled 2018-01-22: qty 2

## 2018-01-22 MED ORDER — GABAPENTIN 300 MG PO CAPS: 300.0000 mg | ORAL_CAPSULE | Freq: Three times a day (TID) | ORAL | Status: DC

## 2018-01-22 MED ORDER — GABAPENTIN 300 MG PO CAPS
600.0000 mg | ORAL_CAPSULE | Freq: Every day | ORAL | Status: DC
  Administered 2018-01-23 – 2018-01-28 (×6): 600 mg via ORAL
  Filled 2018-01-22 (×6): qty 2

## 2018-01-22 MED ORDER — CLOPIDOGREL BISULFATE 75 MG PO TABS
75.0000 mg | ORAL_TABLET | Freq: Every day | ORAL | Status: DC
  Administered 2018-01-22 – 2018-01-29 (×8): 75 mg via ORAL
  Filled 2018-01-22 (×8): qty 1

## 2018-01-22 MED ORDER — SENNOSIDES-DOCUSATE SODIUM 8.6-50 MG PO TABS
1.0000 | ORAL_TABLET | Freq: Every evening | ORAL | Status: DC | PRN
  Filled 2018-01-22: qty 1

## 2018-01-22 MED ORDER — STROKE: EARLY STAGES OF RECOVERY BOOK
Freq: Once | Status: AC
  Administered 2018-01-22: 18:00:00
  Filled 2018-01-22 (×2): qty 1

## 2018-01-22 MED ORDER — VITAMIN E 180 MG (400 UNIT) PO CAPS
400.0000 [IU] | ORAL_CAPSULE | Freq: Every evening | ORAL | Status: DC
  Administered 2018-01-23 – 2018-01-28 (×6): 400 [IU] via ORAL
  Filled 2018-01-22 (×6): qty 1

## 2018-01-22 MED ORDER — GABAPENTIN 300 MG PO CAPS
300.0000 mg | ORAL_CAPSULE | Freq: Every day | ORAL | Status: DC
  Administered 2018-01-23 – 2018-01-29 (×7): 300 mg via ORAL
  Filled 2018-01-22 (×7): qty 1

## 2018-01-22 MED ORDER — DEXAMETHASONE SODIUM PHOSPHATE 4 MG/ML IJ SOLN
8.0000 mg | Freq: Once | INTRAMUSCULAR | Status: DC
  Filled 2018-01-22 (×2): qty 2

## 2018-01-22 MED ORDER — ACETAMINOPHEN 325 MG PO TABS
650.0000 mg | ORAL_TABLET | ORAL | Status: DC | PRN
  Administered 2018-01-22 – 2018-01-27 (×3): 650 mg via ORAL
  Filled 2018-01-22 (×4): qty 2

## 2018-01-22 MED ORDER — OXYCODONE-ACETAMINOPHEN 5-325 MG PO TABS
1.0000 | ORAL_TABLET | Freq: Four times a day (QID) | ORAL | Status: DC | PRN
  Administered 2018-01-22 – 2018-01-29 (×18): 1 via ORAL
  Filled 2018-01-22 (×19): qty 1

## 2018-01-22 MED ORDER — ACETAMINOPHEN 650 MG RE SUPP: 650.0000 mg | RECTAL | Status: DC | PRN

## 2018-01-22 MED ORDER — ACETAMINOPHEN 500 MG PO TABS: 1000.0000 mg | ORAL_TABLET | Freq: Four times a day (QID) | ORAL | Status: DC | PRN

## 2018-01-22 MED ORDER — SIMVASTATIN 20 MG PO TABS
40.0000 mg | ORAL_TABLET | Freq: Every day | ORAL | Status: DC
  Administered 2018-01-22 – 2018-01-28 (×7): 40 mg via ORAL
  Filled 2018-01-22 (×7): qty 2

## 2018-01-22 MED ORDER — FERROUS SULFATE 325 (65 FE) MG PO TABS
325.0000 mg | ORAL_TABLET | Freq: Every day | ORAL | Status: DC
  Administered 2018-01-23 – 2018-01-29 (×7): 325 mg via ORAL
  Filled 2018-01-22 (×7): qty 1

## 2018-01-22 MED ORDER — HYDRALAZINE HCL 20 MG/ML IJ SOLN
10.0000 mg | Freq: Four times a day (QID) | INTRAMUSCULAR | Status: DC | PRN
  Administered 2018-01-22 – 2018-01-28 (×6): 10 mg via INTRAVENOUS
  Filled 2018-01-22 (×8): qty 1

## 2018-01-22 MED ORDER — SODIUM CHLORIDE 0.9 % IV SOLN
INTRAVENOUS | Status: DC
  Administered 2018-01-22 – 2018-01-24 (×2): via INTRAVENOUS

## 2018-01-22 MED ORDER — ACETAMINOPHEN 160 MG/5ML PO SOLN: 650.0000 mg | ORAL | Status: DC | PRN

## 2018-01-22 NOTE — Consult Note (Signed)
   [facility] TeleNeurology Consult Services   Impression:  Acute Ischemic Stroke    Not a tpa candidate due to: LKN > 4.5 hours Does not meet LVO screening criteria (no aphasia, neglect, gaze deviation, dense hemiparesis, or visual field deficits on exam), therefore advanced imaging is not indicated.   Comments:  [facility] contacted: 1610 [facility] at bedside: 1041 NIHSS assessment start time (time the consultation begins): 1042 Last known well time (LKW): last night  Recommendations:  - Permissive HTN and IV fluids - Stroke work up - AP and statin  Inpatient neurology consultation  Inpatient stroke evaluation as per Neurology/ Internal Medicine  Discussed with ED MD  Please call with questions   -----------------------------------------------------------------------------------------   CC right weakness  History of Present Illness   Patient is a 62 year old woman who presented with right side weakness and dizziness, dizziness and headache started yesterday, woek up this morning with worsening of these symptoms and also noted right side numbness, on the way to ED her right side became weaker and more numbness. She described the dizziness as both lightheaded and vertigo.  Diagnostic:  CT head NAF  Exam:  Patient is in no apparent distress. Patient appears as stated age. Patient is well groomed and well-nourished.   NIHSS score:   NIH Stroke Scale: Level of consciousness ( Alert = 0 ), Ask patient current month and age ( Answers both correctly = 0 ), Ask patient to open and close eyes ( Obeys both correctly = 0 ), Best gaze ( Normal = 0 ), Visual field testing ( No visual field loss = 0 ), Facial paresis ( Normal symmetric movement = 0 ), Motor function left arm ( Normal = 0 ), Motor function right arm 2 ( Normal = 0 ), Motor function left leg ( Normal = 0 ), Motor function right leg 3 ( Normal = 0 ), Limb ataxia ( No ataxia = 0 ), Sensory 1 ( Normal = 0 ),  Best language ( No aphasia = 0 ), Dysarthria ( Normal articulation = 0 ), Extinction and inattention ( Normal = 0 ), Total score  6   Medical Decision Making:  - Extensive number of diagnosis or management options are considered above.  - Extensive amount of complex data reviewed.  - High risk of complication and/or morbidity or mortality are associated with differential diagnostic considerations above.  - There may be Uncertain outcome and increased probability of prolonged functional impairment or high probability of severe prolonged functional impairment associated with some of these differential diagnosis.  Medical Data Reviewed:  1.Data reviewed include clinical labs, radiology,Medical Tests;  2.Tests results discussed w/performing or interpreting physician;  3.Obtaining/reviewing old medical records;  4.Obtaining case history from another source;  5.Independent review of image, tracing or specimen.   Patient was informed the Neurology Consult would happen via telehealth (remote video) and consented to receiving care in this manner.

## 2018-01-22 NOTE — ED Notes (Signed)
PT FAILED SWALLOW SCREENING A SWALLOW ELEVATION ORDERED PER PROTOCOL. DR Lacinda Axon NOTIFIED OF THIS. PLAVIX HELD DUE TO THIS. PT'S BP CONTINUES TO RUN HIGH DR COOK  KNOWS ABOUT THIS.
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
## 2018-01-22 NOTE — Progress Notes (Signed)
CODE STROKE [place] exam began 5170 exam finished,images sent to [facility] in epic. Called [facility] , spoke with JANE/bbj
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
## 2018-01-22 NOTE — ED Notes (Signed)
Date and time results received: 01/22/18 1150   Test: Troponin Critical Value: 0.04  Name of Provider Notified:Dr Lacinda Axon  Orders Received? Or Actions Taken?: None

## 2018-01-22 NOTE — ED Triage Notes (Addendum)
Pt was seen by home health today BP high 240/132. Pt states she had a HA when she woke up. EMS notified . Pt reports that she has lack of feeling on right side since she got in ambulance. Decreased stability and , HA, dizziness since last night. HA and dizziness worse when she woke up

## 2018-01-23 ENCOUNTER — Observation Stay (HOSPITAL_COMMUNITY): Payer: Medicare Other

## 2018-01-23 ENCOUNTER — Encounter (HOSPITAL_COMMUNITY): Payer: Self-pay

## 2018-01-23 LAB — BASIC METABOLIC PANEL
ANION GAP: 10 (ref 5–15)
BUN: 35 mg/dL — ABNORMAL HIGH (ref 6–20)
CALCIUM: 8.7 mg/dL — AB (ref 8.9–10.3)
CO2: 25 mmol/L (ref 22–32)
CREATININE: 2.63 mg/dL — AB (ref 0.44–1.00)
Chloride: 104 mmol/L (ref 101–111)
GFR calc non Af Amer: 18 mL/min — ABNORMAL LOW (ref >60)
GFR, EST AFRICAN AMERICAN: 21 mL/min — AB (ref >60)
GLUCOSE: 96 mg/dL (ref 65–99)
Potassium: 3.5 mmol/L (ref 3.5–5.1)
Sodium: 139 mmol/L (ref 135–145)

## 2018-01-23 LAB — LIPID PANEL
CHOL/HDL RATIO: 5.3 ratio
CHOLESTEROL: 229 mg/dL — AB (ref 0–200)
HDL: 43 mg/dL (ref >40)
LDL Cholesterol: 165 mg/dL — ABNORMAL HIGH (ref 0–99)
Triglycerides: 106 mg/dL (ref <150)
VLDL: 21 mg/dL (ref 0–40)

## 2018-01-23 LAB — TSH: TSH: 2.953 u[IU]/mL (ref 0.350–4.500)

## 2018-01-23 LAB — HEMOGLOBIN A1C
Hgb A1c MFr Bld: 5.6 % (ref 4.8–5.6)
Mean Plasma Glucose: 114.02 mg/dL

## 2018-01-23 LAB — HIV ANTIBODY (ROUTINE TESTING W REFLEX): HIV SCREEN 4TH GENERATION: NONREACTIVE

## 2018-01-23 LAB — C-REACTIVE PROTEIN

## 2018-01-23 LAB — VITAMIN B12: Vitamin B-12: 448 pg/mL (ref 180–914)

## 2018-01-23 LAB — SEDIMENTATION RATE: SED RATE: 70 mm/h — AB (ref 0–22)

## 2018-01-23 LAB — TROPONIN I: Troponin I: 0.05 ng/mL (ref <0.03)

## 2018-01-23 NOTE — Plan of Care (Signed)
  Problem: Acute Rehab OT Goals (only OT should resolve) Goal: Pt. Will Perform Grooming Flowsheets (Taken 01/23/2018 1206) Pt Will Perform Grooming: with modified independence;standing Goal: Pt. Will Perform Upper Body Dressing Flowsheets (Taken 01/23/2018 1206) Pt Will Perform Upper Body Dressing: with modified independence;sitting;standing Goal: Pt. Will Perform Lower Body Dressing Flowsheets (Taken 01/23/2018 1206) Pt Will Perform Lower Body Dressing: with modified independence;sitting/lateral leans;sit to/from stand Goal: Pt. Will Transfer To Toilet Flowsheets (Taken 01/23/2018 1206) Pt Will Transfer to Toilet: with modified independence;ambulating;regular height toilet Goal: Pt. Will Perform Toileting-Clothing Manipulation Flowsheets (Taken 01/23/2018 1206) Pt Will Perform Toileting - Clothing Manipulation and hygiene: with modified independence;sitting/lateral leans;sit to/from stand Goal: Pt/Caregiver Will Perform Home Exercise Program Flowsheets (Taken 01/23/2018 1206) Pt/caregiver will Perform Home Exercise Program: Increased strength;Right Upper extremity;Independently;With written HEP provided

## 2018-01-23 NOTE — Care Management (Signed)
Pt from home with husband, ind pta. Uses a cane with ambulation. Pt needs new can, hers is broke. Pt recommended for HH PT vs OP. Pt will have difficulty getting to OP PT d/t transportation issues, has had HH before and would like to being with [facility] prior to OP services. Pt used [facility] in the past and would like to use them again. Pt aware they have 48 hrs to make first visit. Juliann Pulse, [facility] rep, aware of [place] and DME referral and DC planned for this weekend.

## 2018-01-23 NOTE — Evaluation (Signed)
Physical Therapy Evaluation Patient Details Name: Joyce [name] MRN: [id number] DOB: 10-Mar-1956 Today's Date: 01/23/2018   History of Present Illness  Joyce [name] is a 62 y.o. female with medical history of hypertension, hyperlipidemia, chronic back pain, CKD stage III-4, and stroke presented with numbness and tingling of her bilateral toes and hands that started at 9 PM on 01/21/2018.  Patient felt that her symptoms may get better if she slept on it.  When she woke up on morning of 01/22/2018, the patient continued to have numbness and tingling in her bilateral hands and feet.  She called [place] [facility] RN whom advised the patient to go to the ED. upon arrival to the emergency department, the patient felt worsening numbness and tingling on her right upper extremity and right lower extremity and subsequently developed hemiparesis on the right side.  She stated that she began having dysarthria and word finding difficulties while in the emergency department.  In addition, she developed left-sided chest pain that was sharp in nature and moderate in intensity during her stay in the emergency department without any worsening shortness of breath.  She denied any visual disturbance, shortness breath, nausea, vomiting, diarrhea, abdominal pain, dysuria, hematuria.  She denies any hematochezia or melena.    Clinical Impression  Patient functioning near baseline for functional mobility and gait, demonstrates slow labored movement for sitting up at bedside and during gait training with decreased step/stride length and ankle dorsiflexion with RLE.  Patient limited mostly due to fatigue and increasing low back pain with radiation to right hip and tolerated sitting up in chair with spouse present in room after therapy - RN notified.  Patient will benefit from continued physical therapy in hospital and recommended venue below to increase strength, balance, endurance for safe ADLs and gait.     Follow Up  Recommendations Home health PT;Supervision for mobility/OOB    Equipment Recommendations  Other (comment)(quad cane)    Recommendations for Other Services       Precautions / Restrictions Precautions Precautions: Fall Restrictions Weight Bearing Restrictions: No      Mobility  Bed Mobility Overal bed mobility: Needs Assistance Bed Mobility: Supine to Sit;Sit to Supine     Supine to sit: Supervision Sit to supine: Supervision   General bed mobility comments: slow labored movement  Transfers Overall transfer level: Needs assistance Equipment used: Quad cane Transfers: Sit to/from [facility] Sit to Stand: Min guard Stand pivot transfers: Min guard       General transfer comment: slow labored movement  Ambulation/Gait Ambulation/Gait assistance: Min guard Gait Distance (Feet): 22 Feet Assistive device: Quad cane Gait Pattern/deviations: Decreased step length - right;Decreased stance time - right;Decreased stride length Gait velocity: slow   General Gait Details: slow labored cadence with slight difficulty advancing RLE due to weakness, no loss of balance, limited mostly due to c/o fatigue  Stairs            Wheelchair Mobility    Modified Rankin (Stroke Patients Only)       Balance Overall balance assessment: Needs assistance Sitting-balance support: Feet supported;No upper extremity supported Sitting balance-Leahy Scale: Good     Standing balance support: Single extremity supported;During functional activity Standing balance-Leahy Scale: Fair Standing balance comment: using quad cane                             Pertinent Vitals/Pain Pain Assessment: 0-10 Pain Score: 8  Pain Location: chronic  low back Pain Descriptors / Indicators: Aching;Sore Pain Intervention(s): Limited activity within patient's tolerance;Monitored during session    [place] expects to be discharged to:: Private  residence Living Arrangements: Spouse/significant other Available Help at Discharge: Family;Available 24 hours/day Type of Home: Apartment Home Access: Stairs to enter;Ramped entrance Entrance Stairs-Rails: None Entrance Stairs-Number of Steps: 1 Home Layout: One level Home Equipment: Cane - quad;Cane - single point;Shower seat;Grab bars - tub/shower;Walker - 2 wheels Additional Comments: present quad cane has missing rubber tip on one prong    Prior Function Level of Independence: Needs assistance   Gait / Transfers Assistance Needed: household and short distanced community gait with quad-cane or SPC, requires assistance for going up/down steps  ADL's / Homemaking Assistance Needed: assisted by spouse        Hand Dominance   Dominant Hand: Right    Extremity/Trunk Assessment   Upper Extremity Assessment Upper Extremity Assessment: Defer to OT evaluation RUE Deficits / Details: strength is grossly 3+/5 in RUE, decreased grip strength RUE Sensation: WNL RUE Coordination: decreased fine motor    Lower Extremity Assessment Lower Extremity Assessment: Generalized weakness;RLE deficits/detail;LLE deficits/detail RLE Deficits / Details: grossly 3+/5 LLE Deficits / Details: grossly 5/5    Cervical / Trunk Assessment Cervical / Trunk Assessment: Normal  Communication   Communication: No difficulties  Cognition Arousal/Alertness: Awake/alert Behavior During Therapy: WFL for tasks assessed/performed Overall Cognitive Status: Within Functional Limits for tasks assessed                                        General Comments      Exercises     Assessment/Plan    PT Assessment Patient needs continued PT services  PT Problem List Decreased strength;Decreased activity tolerance;Decreased balance;Decreased mobility       PT Treatment Interventions Gait training;Stair training;Functional mobility training;Therapeutic activities;Therapeutic  exercise;Patient/family education;Neuromuscular re-education    PT Goals (Current goals can be found in the Care Plan section)  Acute Rehab PT Goals Patient Stated Goal: return home with spouse to assist PT Goal Formulation: With patient/family Time For Goal Achievement: 01/30/18 Potential to Achieve Goals: Good    Frequency 7X/week   Barriers to discharge        Co-evaluation               AM-PAC PT "6 Clicks" Daily Activity  Outcome Measure Difficulty turning over in bed (including adjusting bedclothes, sheets and blankets)?: None Difficulty moving from lying on back to sitting on the side of the bed? : None Difficulty sitting down on and standing up from a chair with arms (e.g., wheelchair, bedside commode, etc,.)?: A Little Help needed moving to and from a bed to chair (including a wheelchair)?: A Little Help needed walking in hospital room?: A Little Help needed climbing 3-5 steps with a railing? : A Lot 6 Click Score: 19    End of Session Equipment Utilized During Treatment: Gait belt Activity Tolerance: Patient tolerated treatment well;Patient limited by fatigue Patient left: in chair;with call bell/phone within reach;with family/visitor present Nurse Communication: Mobility status PT Visit Diagnosis: Unsteadiness on feet (R26.81);Other abnormalities of gait and mobility (R26.89);Muscle weakness (generalized) (M62.81)    Time: 2703-5009 PT Time Calculation (min) (ACUTE ONLY): 30 min   Charges:   PT Evaluation $PT Eval Moderate Complexity: 1 Mod PT Treatments $Therapeutic Activity: 23-37 mins   PT G Codes:  11:11 AM, 01/23/18 Lonell Grandchild, MPT Physical Therapist with [facility] 336 [phone] office [phone] mobile phone
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
## 2018-01-23 NOTE — Progress Notes (Signed)
Ambulated to BR with walker slowly.  Husband has been at bedside all day.
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
## 2018-01-23 NOTE — Progress Notes (Signed)
Was on puree food and switched to heart diet at lunch and denies difficulty swallowing.

## 2018-01-23 NOTE — Care Management (Signed)
CM consult received for medications needs. Pt with [facility] and medicaid with drug coverage. No medication assistance available. CM will sign off and my be re-consulted if further needs arise.

## 2018-01-23 NOTE — Plan of Care (Signed)
  Problem: Acute Rehab PT Goals(only PT should resolve) Goal: Pt Will Go Supine/Side To Sit Outcome: Progressing Flowsheets (Taken 01/23/2018 1113) Pt will go Supine/Side to Sit: with modified independence Goal: Patient Will Transfer Sit To/From Stand Outcome: Progressing Flowsheets (Taken 01/23/2018 1113) Patient will transfer sit to/from stand: with supervision Goal: Pt Will Transfer Bed To Chair/Chair To Bed Outcome: Progressing Flowsheets (Taken 01/23/2018 1113) Pt will Transfer Bed to Chair/Chair to Bed: with supervision Goal: Pt Will Ambulate Outcome: Progressing Flowsheets (Taken 01/23/2018 1113) Pt will Ambulate: 50 feet;with cane;with supervision Note:  Quad cane   11:14 AM, 01/23/18 Lonell Grandchild, MPT Physical Therapist with [facility] 336 [phone] office [phone] mobile phone

## 2018-01-23 NOTE — Progress Notes (Signed)
  Speech Language Pathology Treatment: Dysphagia  Patient Details Name: Joyce [name] MRN: [id number] DOB: 1955/09/19 Today's Date: 01/23/2018 Time: 1000-1030 SLP Time Calculation (min) (ACUTE ONLY): 30 min  Assessment / Plan / Recommendation Clinical Impression  Dysphagia treatment provided to check on diet tolerance/ consider advancement. Pt reports s/s of dysphagia have improved- less sensation of food getting stuck in throat (past date- globus sensation on R side). Pt consumed regular solids/ liquids without overt s/s of aspiration; pt is taking small bites/ sips at a time which appears to be precautionary as opposed to a deficit. Recommend advancing diet to regular/ thin liquids; pt finds it beneficial to have meds whole in pudding, alternate food/ liquid to clear any residuals. Do not feel that MBS is warranted given improvements observed at bedside. Pt is in agreement with plan. SLP will continue to follow for cognitive-linguistic skills.  HPI HPI: 62 y.o. female with medical history of hypertension, hyperlipidemia, chronic back pain, CKD stage III-4, and stroke presented with numbness and tingling of her bilateral toes and hands that started at 9 PM on 01/21/2018.  Patient felt that her symptoms may get better if she slept on it.  When she woke up on morning of 01/22/2018, the patient continued to have numbness and tingling in her bilateral hands and feet.  She called [place] [facility] RN whom advised the patient to go to the ED. upon arrival to the emergency department, the patient felt worsening numbness and tingling on her right upper extremity and right lower extremity and subsequently developed hemiparesis on the right side.  She stated that she began having dysarthria and word finding difficulties while in the emergency department.  In addition, she developed left-sided chest pain that was sharp in nature and moderate in intensity during her stay in the emergency department without any worsening  shortness of breath.  She denied any visual disturbance, shortness breath, nausea, vomiting, diarrhea, abdominal pain, dysuria, hematuria.  She denies any hematochezia or melena.      SLP Plan  Continue with current plan of care       Recommendations  Diet recommendations: Regular;Thin liquid Liquids provided via: Cup;Straw Medication Administration: Whole meds with puree(per pt preference- whole in pudding) Supervision: Patient able to self feed;Intermittent supervision to cue for compensatory strategies Compensations: Slow rate;Small sips/bites;Follow solids with liquid Postural Changes and/or Swallow Maneuvers: Seated upright 90 degrees                Oral Care Recommendations: Oral care BID Follow up Recommendations: (outpatient for aphasia) SLP Visit Diagnosis: Dysphagia, pharyngeal phase (R13.13) Plan: Continue with current plan of care       GO                Kern Reap, MA, CCC-SLP 01/23/2018, 10:34 AM
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
## 2018-01-24 ENCOUNTER — Encounter (HOSPITAL_COMMUNITY): Payer: Self-pay | Admitting: *Deleted

## 2018-01-24 ENCOUNTER — Observation Stay (HOSPITAL_COMMUNITY): Payer: Medicare Other

## 2018-01-24 LAB — URINALYSIS, COMPLETE (UACMP) WITH MICROSCOPIC
BILIRUBIN URINE: NEGATIVE
Glucose, UA: NEGATIVE mg/dL
HGB URINE DIPSTICK: NEGATIVE
KETONES UR: NEGATIVE mg/dL
LEUKOCYTES UA: NEGATIVE
NITRITE: NEGATIVE
PH: 5 (ref 5.0–8.0)
Protein, ur: NEGATIVE mg/dL
SPECIFIC GRAVITY, URINE: 1.012 (ref 1.005–1.030)

## 2018-01-24 LAB — ECHOCARDIOGRAM COMPLETE
Height: 67 in
Weight: 3584 oz

## 2018-01-24 MED ORDER — LABETALOL HCL 200 MG PO TABS
200.0000 mg | ORAL_TABLET | Freq: Two times a day (BID) | ORAL | Status: DC
  Administered 2018-01-24 – 2018-01-25 (×3): 200 mg via ORAL
  Filled 2018-01-24 (×3): qty 1

## 2018-01-24 MED ORDER — AMLODIPINE BESYLATE 5 MG PO TABS
5.0000 mg | ORAL_TABLET | Freq: Every day | ORAL | Status: DC
  Administered 2018-01-24 – 2018-01-28 (×5): 5 mg via ORAL
  Filled 2018-01-24 (×5): qty 1

## 2018-01-24 NOTE — Progress Notes (Signed)
Physical Therapy Treatment Patient Details Name: Joyce [name] MRN: [id number] DOB: 03-20-1956 Today's Date: 01/24/2018    History of Present Illness Joyce [name] is a 62 y.o. female with medical history of hypertension, hyperlipidemia, chronic back pain, CKD stage III-4, and stroke presented with numbness and tingling of her bilateral toes and hands that started at 9 PM on 01/21/2018.  Patient felt that her symptoms may get better if she slept on it.  When she woke up on morning of 01/22/2018, the patient continued to have numbness and tingling in her bilateral hands and feet.  She called [place] [facility] RN whom advised the patient to go to the ED. upon arrival to the emergency department, the patient felt worsening numbness and tingling on her right upper extremity and right lower extremity and subsequently developed hemiparesis on the right side.  She stated that she began having dysarthria and word finding difficulties while in the emergency department.  In addition, she developed left-sided chest pain that was sharp in nature and moderate in intensity during her stay in the emergency department without any worsening shortness of breath.  She denied any visual disturbance, shortness breath, nausea, vomiting, diarrhea, abdominal pain, dysuria, hematuria.  She denies any hematochezia or melena.    PT Comments    Patient had difficulty moving RLE when attempting exercises and required active assistance for ankle pumps, and hip raises, limited to extending right knee against gravity up to 20-30% of available ROM due to weakness, demonstrates increased endurance/distance for gait training holding quad-cane with RUE mostly, but had to switch to left hand when returning back to room due to c/o RUE soreness.   Patient tolerated sitting up in chair with spouse present in room after therapy.   Patient will benefit from continued physical therapy in hospital and recommended venue below to increase strength,  balance, endurance for safe ADLs and gait.   Follow Up Recommendations  Home health PT;Supervision for mobility/OOB;Outpatient PT     Equipment Recommendations  Other (comment)    Recommendations for Other Services       Precautions / Restrictions Precautions Precautions: Fall Restrictions Weight Bearing Restrictions: No    Mobility  Bed Mobility               General bed mobility comments: Patient presents seated at bedside  Transfers Overall transfer level: Needs assistance Equipment used: Quad cane Transfers: Sit to/from [facility] Sit to Stand: Min guard Stand pivot transfers: Supervision       General transfer comment: slow labored movement  Ambulation/Gait Ambulation/Gait assistance: Min guard Gait Distance (Feet): 40 Feet Assistive device: Quad cane Gait Pattern/deviations: Decreased step length - right;Decreased stance time - right;Decreased stride length Gait velocity: slow   General Gait Details: slow labored cadence holding cane in right hand, had to switch cane to left hand due to RUE soreness, no loss of balance, limited due to fatigue   Stairs             Wheelchair Mobility    Modified Rankin (Stroke Patients Only)       Balance Overall balance assessment: Needs assistance Sitting-balance support: Feet supported;No upper extremity supported Sitting balance-Leahy Scale: Good     Standing balance support: Single extremity supported;During functional activity Standing balance-Leahy Scale: Fair Standing balance comment: using quad cane                            Cognition Arousal/Alertness:  Awake/alert Behavior During Therapy: WFL for tasks assessed/performed Overall Cognitive Status: Within Functional Limits for tasks assessed                                        Exercises General Exercises - Lower Extremity Long Arc Quad: Seated;AROM;AAROM;Strengthening;Both;10 reps Hip  Flexion/Marching: Seated;AROM;AAROM;Strengthening;Both;10 reps Toe Raises: Seated;AROM;Strengthening;Left;10 reps Heel Raises: Seated;AROM;Strengthening;Left;10 reps    General Comments        Pertinent Vitals/Pain Pain Assessment: 0-10 Pain Score: 9  Pain Location: low back, right upper arm, RLE Pain Descriptors / Indicators: Numbness;Sore;Aching    Home Living                      Prior Function            PT Goals (current goals can now be found in the care plan section) Acute Rehab PT Goals Patient Stated Goal: return home with spouse to assist PT Goal Formulation: With patient/family Time For Goal Achievement: 01/30/18 Potential to Achieve Goals: Good Progress towards PT goals: Progressing toward goals    Frequency    7X/week      PT Plan Current plan remains appropriate    Co-evaluation              AM-PAC PT "6 Clicks" Daily Activity  Outcome Measure  Difficulty turning over in bed (including adjusting bedclothes, sheets and blankets)?: None Difficulty moving from lying on back to sitting on the side of the bed? : None Difficulty sitting down on and standing up from a chair with arms (e.g., wheelchair, bedside commode, etc,.)?: A Little Help needed moving to and from a bed to chair (including a wheelchair)?: A Little Help needed walking in hospital room?: A Little Help needed climbing 3-5 steps with a railing? : A Little 6 Click Score: 20    End of Session   Activity Tolerance: Patient tolerated treatment well;Patient limited by fatigue Patient left: in chair;with call bell/phone within reach;with family/visitor present Nurse Communication: Mobility status PT Visit Diagnosis: Unsteadiness on feet (R26.81);Other abnormalities of gait and mobility (R26.89);Muscle weakness (generalized) (M62.81)     Time: 7741-2878 PT Time Calculation (min) (ACUTE ONLY): 37 min  Charges:  $Gait Training: 8-22 mins $Therapeutic Exercise: 8-22 mins                     G Codes:       10:44 AM, 02/02/2018 Joyce [name], MPT Physical Therapist with [facility] [phone] office [phone] mobile phone

## 2018-01-24 NOTE — Progress Notes (Signed)
*  PRELIMINARY RESULTS* Echocardiogram 2D Echocardiogram has been performed.  Joyce [name] 01/24/2018, 8:36 AM

## 2018-01-24 NOTE — Progress Notes (Addendum)
Patient admitted withFluctuating right-sided hemiparesis involving arm and leg distribution.. She has accelerated hypertension due to failure to take labetalol as well as Norvasc patient has accelerated hypertension due to failure to take labetalol as well as Norvasc.  Both will be reinstituted today for hypertensive control PYK:[id number] DOB: Sep 07, 1955 DOA: 01/22/2018 PCP: Lucia Gaskins, MD   Physical Exam: Blood pressure (!) 217/74, pulse 68, temperature 98.4 F (36.9 C), temperature source Oral, resp. rate 16, height 5\' 7"  (1.702 m), weight 101.6 kg (224 lb), last menstrual period 12/04/1994, SpO2 100 %. Both will be reinstituted at present systolic currently 539 lungs clear to A&P no rales rhonchi heart regular rhythm no S3-S4 no heaves thrills rubs.  Neurologic no cranial nerve deficits noted patient alert and oriented questionable right upper greater than right lower leg hemiparesis she is not consistent on exam and distraction   Investigations:  No results found for this or any previous visit (from the past 240 hour(s)).   Basic Metabolic Panel: Recent Labs    01/22/18 1057 01/23/18 0411  NA 142 139  K 4.1 3.5  CL 109 104  CO2 24 25  GLUCOSE 96 96  BUN 37* 35*  CREATININE 2.37* 2.63*  CALCIUM 9.1 8.7*   Liver Function Tests: Recent Labs    01/22/18 1057  AST 35  ALT 24  ALKPHOS 103  BILITOT 0.5  PROT 7.5  ALBUMIN 3.7     CBC: Recent Labs    01/22/18 1034 01/22/18 1057  WBC  --  6.3  NEUTROABS  --  3.3  HGB 10.5* 10.6*  HCT 31.0* 33.3*  MCV  --  84.1  PLT  --  237    Dg Chest 2 View  Result Date: 01/22/2018 CLINICAL DATA:  Increased BP, Headache, dizziness onset last night, worse this morning when she woke. Pt reports that she has lack of feeling on right side since she got in ambulance. History of HTN, Stroke, renal disorder, bronchitis. EXAM: CHEST - 2 VIEW COMPARISON:  06/19/2016 FINDINGS: Heart is UPPER limits normal. There are no focal  consolidations or pleural effusions. No pulmonary edema. IMPRESSION: No active cardiopulmonary disease. Electronically Signed   By: Nolon Nations M.D.   On: 01/22/2018 17:37   Mr Brain Wo Contrast  Result Date: 01/22/2018 CLINICAL DATA:  Hypertensive and headache. Gait instability and dizziness since last night. History of hypertension, hyperlipidemia, stroke. EXAM: MRI HEAD WITHOUT CONTRAST MRA HEAD WITHOUT CONTRAST TECHNIQUE: Multiplanar, multiecho pulse sequences of the brain and surrounding structures were obtained without intravenous contrast. Angiographic images of the head were obtained using MRA technique without contrast. COMPARISON:  CT HEAD January 22, 2018 FINDINGS: MRI HEAD FINDINGS INTRACRANIAL CONTENTS: No reduced diffusion to suggest acute ischemia. Susceptibility artifact RIGHT basal ganglia/external half capsule corresponding to old infarct. Old LEFT basal ganglia lacunar infarct. No parenchymal brain volume loss for age. No midline shift, mass effect or masses. No abnormal extra-axial fluid collections. Patchy to confluent supratentorial white matter FLAIR T2 hyperintensities including bilateral anterior temporal poles and medial frontal lobes. Old LEFT cerebellar infarct. VASCULAR: Normal major intracranial vascular flow voids present at skull base. SKULL AND UPPER CERVICAL SPINE: No abnormal sellar expansion. No suspicious calvarial bone marrow signal. Craniocervical junction maintained. C3-4 small broad-based disc bulge. SINUSES/ORBITS: The mastoid air-cells and included paranasal sinuses are well-aerated.The included ocular globes and orbital contents are non-suspicious. OTHER: None. MRA HEAD FINDINGS ANTERIOR CIRCULATION: Normal flow related enhancement of the included cervical, petrous, cavernous and supraclinoid internal carotid arteries. Patent  anterior communicating artery. Patent anterior and middle cerebral arteries. Mild luminal irregularity compatible with atherosclerosis with  superimposed moderate stenoses bilateral MCA and ACA. No large vessel occlusion, flow limiting stenosis, aneurysm. POSTERIOR CIRCULATION: RIGHT vertebral artery is dominant. Vertebrobasilar arteries are patent, with normal flow related enhancement of the main branch vessels. Patent posterior cerebral arteries, mild luminal irregularity with superimposed multifocal moderate stenosis. Bilateral posterior communicating arteries present. No large vessel occlusion, flow limiting stenosis,  aneurysm. ANATOMIC VARIANTS: Hypoplastic RIGHT P1 segment. Source images and MIP images were reviewed. IMPRESSION: MRI HEAD: 1. No acute intracranial process. 2. Advanced white matter changes for age could reflect chronic small vessel ischemic disease though given distribution, CADASIL is possible. 3. 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 Old LEFT basal ganglion lacunar infarct. Old LEFT cerebellar infarct. MRA HEAD: 1. No emergent large vessel occlusion or flow-limiting stenosis. 2. Intracranial atherosclerosis with multifocal moderate cerebral artery stenoses. Electronically Signed   By: Elon Alas M.D.   On: 01/22/2018 17:33   Mr Cervical Spine Wo Contrast  Result Date: 01/23/2018 CLINICAL DATA:  Demyelinating disease with spinal cord symptoms. EXAM: MRI CERVICAL SPINE WITHOUT CONTRAST TECHNIQUE: Multiplanar, multisequence MR imaging of the cervical spine was performed. No intravenous contrast was administered. COMPARISON:  None. FINDINGS: Alignment: Mild retrolisthesis C3-4. Vertebrae: Normal bone marrow.  Negative for fracture or mass Cord: Normal spinal cord.  No cord lesions identified. Posterior Fossa, vertebral arteries, paraspinal tissues: Negative Disc levels: C2-3: Negative C3-4: Central disc protrusion with cord flattening and mild spinal stenosis. Neural foramina patent bilaterally C4-5: Small central disc protrusion with mild spinal stenosis. Neural foramina patent bilaterally. C5-6: Disc degeneration  and mild uncinate spurring diffusely. No significant spinal or foraminal stenosis. C6-7: Diffuse uncinate spurring causing mild foraminal stenosis bilaterally. Spinal canal adequate in size C7-T1: Negative IMPRESSION: Cervical spinal cord appears normal without demyelinating disease. Cervical spine degenerative change causing mild spinal and foraminal stenosis as above. Electronically Signed   By: Franchot Gallo M.D.   On: 01/23/2018 16:05   US Carotid Bilateral (at [facility] And [facility] Only)  Result Date: 01/23/2018 CLINICAL DATA:  Bilateral hand hand toes numbness with tingling for 2 days. Dysarthria. EXAM: BILATERAL CAROTID DUPLEX ULTRASOUND TECHNIQUE: Pearline Cables scale imaging, color Doppler and duplex ultrasound were performed of bilateral carotid and vertebral arteries in the neck. COMPARISON:  None. FINDINGS: Criteria: Quantification of carotid stenosis is based on velocity parameters that correlate the residual internal carotid diameter with NASCET-based stenosis levels, using the diameter of the distal internal carotid lumen as the denominator for stenosis measurement. The following velocity measurements were obtained: RIGHT ICA:  149 cm/sec CCA:  025 cm/sec SYSTOLIC ICA/CCA RATIO:  0.9 DIASTOLIC ICA/CCA RATIO: ECA:  105 cm/sec LEFT ICA:  111 cm/sec CCA:  852 cm/sec SYSTOLIC ICA/CCA RATIO:  0.6 DIASTOLIC ICA/CCA RATIO: ECA:  139 cm/sec RIGHT CAROTID ARTERY: Moderate smooth soft plaque in the bulb. Low resistance internal carotid Doppler pattern is preserved. RIGHT VERTEBRAL ARTERY:  Antegrade. LEFT CAROTID ARTERY: Mild smooth soft plaque in the bulb. Low resistance internal carotid Doppler pattern is preserved. LEFT VERTEBRAL ARTERY:  Antegrade. IMPRESSION: 50-69% stenosis in the right internal carotid artery. Less than 50% stenosis in the left internal carotid artery. Electronically Signed   By: Marybelle Killings M.D.   On: 01/23/2018 11:56   Mr Jodene Nam Head/brain DP Cm  Result Date: 01/22/2018 CLINICAL DATA:  Hypertensive  and headache. Gait instability and dizziness since last night. History of hypertension, hyperlipidemia, stroke. EXAM: MRI HEAD WITHOUT CONTRAST  MRA HEAD WITHOUT CONTRAST TECHNIQUE: Multiplanar, multiecho pulse sequences of the brain and surrounding structures were obtained without intravenous contrast. Angiographic images of the head were obtained using MRA technique without contrast. COMPARISON:  CT HEAD January 22, 2018 FINDINGS: MRI HEAD FINDINGS INTRACRANIAL CONTENTS: No reduced diffusion to suggest acute ischemia. Susceptibility artifact RIGHT basal ganglia/external half capsule corresponding to old infarct. Old LEFT basal ganglia lacunar infarct. No parenchymal brain volume loss for age. No midline shift, mass effect or masses. No abnormal extra-axial fluid collections. Patchy to confluent supratentorial white matter FLAIR T2 hyperintensities including bilateral anterior temporal poles and medial frontal lobes. Old LEFT cerebellar infarct. VASCULAR: Normal major intracranial vascular flow voids present at skull base. SKULL AND UPPER CERVICAL SPINE: No abnormal sellar expansion. No suspicious calvarial bone marrow signal. Craniocervical junction maintained. C3-4 small broad-based disc bulge. SINUSES/ORBITS: The mastoid air-cells and included paranasal sinuses are well-aerated.The included ocular globes and orbital contents are non-suspicious. OTHER: None. MRA HEAD FINDINGS ANTERIOR CIRCULATION: Normal flow related enhancement of the included cervical, petrous, cavernous and supraclinoid internal carotid arteries. Patent anterior communicating artery. Patent anterior and middle cerebral arteries. Mild luminal irregularity compatible with atherosclerosis with superimposed moderate stenoses bilateral MCA and ACA. No large vessel occlusion, flow limiting stenosis, aneurysm. POSTERIOR CIRCULATION: RIGHT vertebral artery is dominant. Vertebrobasilar arteries are patent, with normal flow related enhancement of the main  branch vessels. Patent posterior cerebral arteries, mild luminal irregularity with superimposed multifocal moderate stenosis. Bilateral posterior communicating arteries present. No large vessel occlusion, flow limiting stenosis,  aneurysm. ANATOMIC VARIANTS: Hypoplastic RIGHT P1 segment. Source images and MIP images were reviewed. IMPRESSION: MRI HEAD: 1. No acute intracranial process. 2. Advanced white matter changes for age could reflect chronic small vessel ischemic disease though given distribution, CADASIL is possible. 3. 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 Old LEFT basal ganglion lacunar infarct. Old LEFT cerebellar infarct. MRA HEAD: 1. No emergent large vessel occlusion or flow-limiting stenosis. 2. Intracranial atherosclerosis with multifocal moderate cerebral artery stenoses. Electronically Signed   By: Elon Alas M.D.   On: 01/22/2018 17:33      Medications:   Impression:  Active Problems:   Essential hypertension, benign   Hyperlipidemia   Chronic back pain   Non compliance w medication regimen   Right hemiparesis (HCC)   Malignant hypertension   Nonspecific chest pain   Sensory disturbance     Plan:Reinstitute Norvasc 5 mg along with labetalol 200 mg p.o. twice daily for hypertensive control.  Monitor renal function daily to see if there is any acute reversible component.  Long talk about substance abuse with her and her husband as there is cocaine in her urine drug screen.  Dr. Merlene Laughter will return with questions of multiple sclerosis due to high degree of plaque in CT scan of head  Consultants: Neurology    Procedures   Antibiotics:          Time spent:45 minutes   LOS: 0 days   Samule Life M   01/24/2018, 12:21 PM

## 2018-01-24 NOTE — Progress Notes (Signed)
Patient continues with right arm paresis .  She is alert and oriented x 3 today  Joyce [name] VHQ:[id number] DOB: 01-18-1956 DOA: 01/22/2018 PCP: Lucia Gaskins, MD   Physical Exam: Blood pressure (!) 217/74, pulse 68, temperature 98.4 F (36.9 C), temperature source Oral, resp. rate 16, height 5\' 7"  (1.702 m), weight 101.6 kg (224 lb), last menstrual period 12/04/1994, SpO2 100 %.Lungs clear to A & P . No rales wheeze or ronchi . Heart - RR no S3 S4 no heaves thrills or rubs    Investigations:  No results found for this or any previous visit (from the past 240 hour(s)).   Basic Metabolic Panel: Recent Labs    01/22/18 1057 01/23/18 0411  NA 142 139  K 4.1 3.5  CL 109 104  CO2 24 25  GLUCOSE 96 96  BUN 37* 35*  CREATININE 2.37* 2.63*  CALCIUM 9.1 8.7*   Liver Function Tests: Recent Labs    01/22/18 1057  AST 35  ALT 24  ALKPHOS 103  BILITOT 0.5  PROT 7.5  ALBUMIN 3.7     CBC: Recent Labs    01/22/18 1034 01/22/18 1057  WBC  --  6.3  NEUTROABS  --  3.3  HGB 10.5* 10.6*  HCT 31.0* 33.3*  MCV  --  84.1  PLT  --  237    Dg Chest 2 View  Result Date: 01/22/2018 CLINICAL DATA:  Increased BP, Headache, dizziness onset last night, worse this morning when she woke. Pt reports that she has lack of feeling on right side since she got in ambulance. History of HTN, Stroke, renal disorder, bronchitis. EXAM: CHEST - 2 VIEW COMPARISON:  06/19/2016 FINDINGS: Heart is UPPER limits normal. There are no focal consolidations or pleural effusions. No pulmonary edema. IMPRESSION: No active cardiopulmonary disease. Electronically Signed   By: Nolon Nations M.D.   On: 01/22/2018 17:37   Mr Brain Wo Contrast  Result Date: 01/22/2018 CLINICAL DATA:  Hypertensive and headache. Gait instability and dizziness since last night. History of hypertension, hyperlipidemia, stroke. EXAM: MRI HEAD WITHOUT CONTRAST MRA HEAD WITHOUT CONTRAST TECHNIQUE: Multiplanar, multiecho pulse sequences  of the brain and surrounding structures were obtained without intravenous contrast. Angiographic images of the head were obtained using MRA technique without contrast. COMPARISON:  CT HEAD January 22, 2018 FINDINGS: MRI HEAD FINDINGS INTRACRANIAL CONTENTS: No reduced diffusion to suggest acute ischemia. Susceptibility artifact RIGHT basal ganglia/external half capsule corresponding to old infarct. Old LEFT basal ganglia lacunar infarct. No parenchymal brain volume loss for age. No midline shift, mass effect or masses. No abnormal extra-axial fluid collections. Patchy to confluent supratentorial white matter FLAIR T2 hyperintensities including bilateral anterior temporal poles and medial frontal lobes. Old LEFT cerebellar infarct. VASCULAR: Normal major intracranial vascular flow voids present at skull base. SKULL AND UPPER CERVICAL SPINE: No abnormal sellar expansion. No suspicious calvarial bone marrow signal. Craniocervical junction maintained. C3-4 small broad-based disc bulge. SINUSES/ORBITS: The mastoid air-cells and included paranasal sinuses are well-aerated.The included ocular globes and orbital contents are non-suspicious. OTHER: None. MRA HEAD FINDINGS ANTERIOR CIRCULATION: Normal flow related enhancement of the included cervical, petrous, cavernous and supraclinoid internal carotid arteries. Patent anterior communicating artery. Patent anterior and middle cerebral arteries. Mild luminal irregularity compatible with atherosclerosis with superimposed moderate stenoses bilateral MCA and ACA. No large vessel occlusion, flow limiting stenosis, aneurysm. POSTERIOR CIRCULATION: RIGHT vertebral artery is dominant. Vertebrobasilar arteries are patent, with normal flow related enhancement of the main branch vessels. Patent posterior cerebral arteries,  mild luminal irregularity with superimposed multifocal moderate stenosis. Bilateral posterior communicating arteries present. No large vessel occlusion, flow limiting  stenosis,  aneurysm. ANATOMIC VARIANTS: Hypoplastic RIGHT P1 segment. Source images and MIP images were reviewed. IMPRESSION: MRI HEAD: 1. No acute intracranial process. 2. Advanced white matter changes for age could reflect chronic small vessel ischemic disease though given distribution, CADASIL is possible. 3. 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 Old LEFT basal ganglion lacunar infarct. Old LEFT cerebellar infarct. MRA HEAD: 1. No emergent large vessel occlusion or flow-limiting stenosis. 2. Intracranial atherosclerosis with multifocal moderate cerebral artery stenoses. Electronically Signed   By: Elon Alas M.D.   On: 01/22/2018 17:33   Mr Cervical Spine Wo Contrast  Result Date: 01/23/2018 CLINICAL DATA:  Demyelinating disease with spinal cord symptoms. EXAM: MRI CERVICAL SPINE WITHOUT CONTRAST TECHNIQUE: Multiplanar, multisequence MR imaging of the cervical spine was performed. No intravenous contrast was administered. COMPARISON:  None. FINDINGS: Alignment: Mild retrolisthesis C3-4. Vertebrae: Normal bone marrow.  Negative for fracture or mass Cord: Normal spinal cord.  No cord lesions identified. Posterior Fossa, vertebral arteries, paraspinal tissues: Negative Disc levels: C2-3: Negative C3-4: Central disc protrusion with cord flattening and mild spinal stenosis. Neural foramina patent bilaterally C4-5: Small central disc protrusion with mild spinal stenosis. Neural foramina patent bilaterally. C5-6: Disc degeneration and mild uncinate spurring diffusely. No significant spinal or foraminal stenosis. C6-7: Diffuse uncinate spurring causing mild foraminal stenosis bilaterally. Spinal canal adequate in size C7-T1: Negative IMPRESSION: Cervical spinal cord appears normal without demyelinating disease. Cervical spine degenerative change causing mild spinal and foraminal stenosis as above. Electronically Signed   By: Franchot Gallo M.D.   On: 01/23/2018 16:05   US Carotid Bilateral (at [facility]  And Ap Only)  Result Date: 01/23/2018 CLINICAL DATA:  Bilateral hand hand toes numbness with tingling for 2 days. Dysarthria. EXAM: BILATERAL CAROTID DUPLEX ULTRASOUND TECHNIQUE: Pearline Cables scale imaging, color Doppler and duplex ultrasound were performed of bilateral carotid and vertebral arteries in the neck. COMPARISON:  None. FINDINGS: Criteria: Quantification of carotid stenosis is based on velocity parameters that correlate the residual internal carotid diameter with NASCET-based stenosis levels, using the diameter of the distal internal carotid lumen as the denominator for stenosis measurement. The following velocity measurements were obtained: RIGHT ICA:  149 cm/sec CCA:  324 cm/sec SYSTOLIC ICA/CCA RATIO:  0.9 DIASTOLIC ICA/CCA RATIO: ECA:  105 cm/sec LEFT ICA:  111 cm/sec CCA:  401 cm/sec SYSTOLIC ICA/CCA RATIO:  0.6 DIASTOLIC ICA/CCA RATIO: ECA:  139 cm/sec RIGHT CAROTID ARTERY: Moderate smooth soft plaque in the bulb. Low resistance internal carotid Doppler pattern is preserved. RIGHT VERTEBRAL ARTERY:  Antegrade. LEFT CAROTID ARTERY: Mild smooth soft plaque in the bulb. Low resistance internal carotid Doppler pattern is preserved. LEFT VERTEBRAL ARTERY:  Antegrade. IMPRESSION: 50-69% stenosis in the right internal carotid artery. Less than 50% stenosis in the left internal carotid artery. Electronically Signed   By: Marybelle Killings M.D.   On: 01/23/2018 11:56   Mr Jodene Nam Head/brain UU Cm  Result Date: 01/22/2018 CLINICAL DATA:  Hypertensive and headache. Gait instability and dizziness since last night. History of hypertension, hyperlipidemia, stroke. EXAM: MRI HEAD WITHOUT CONTRAST MRA HEAD WITHOUT CONTRAST TECHNIQUE: Multiplanar, multiecho pulse sequences of the brain and surrounding structures were obtained without intravenous contrast. Angiographic images of the head were obtained using MRA technique without contrast. COMPARISON:  CT HEAD January 22, 2018 FINDINGS: MRI HEAD FINDINGS INTRACRANIAL CONTENTS: No  reduced diffusion to suggest acute ischemia. Susceptibility artifact RIGHT basal  ganglia/external half capsule corresponding to old infarct. Old LEFT basal ganglia lacunar infarct. No parenchymal brain volume loss for age. No midline shift, mass effect or masses. No abnormal extra-axial fluid collections. Patchy to confluent supratentorial white matter FLAIR T2 hyperintensities including bilateral anterior temporal poles and medial frontal lobes. Old LEFT cerebellar infarct. VASCULAR: Normal major intracranial vascular flow voids present at skull base. SKULL AND UPPER CERVICAL SPINE: No abnormal sellar expansion. No suspicious calvarial bone marrow signal. Craniocervical junction maintained. C3-4 small broad-based disc bulge. SINUSES/ORBITS: The mastoid air-cells and included paranasal sinuses are well-aerated.The included ocular globes and orbital contents are non-suspicious. OTHER: None. MRA HEAD FINDINGS ANTERIOR CIRCULATION: Normal flow related enhancement of the included cervical, petrous, cavernous and supraclinoid internal carotid arteries. Patent anterior communicating artery. Patent anterior and middle cerebral arteries. Mild luminal irregularity compatible with atherosclerosis with superimposed moderate stenoses bilateral MCA and ACA. No large vessel occlusion, flow limiting stenosis, aneurysm. POSTERIOR CIRCULATION: RIGHT vertebral artery is dominant. Vertebrobasilar arteries are patent, with normal flow related enhancement of the main branch vessels. Patent posterior cerebral arteries, mild luminal irregularity with superimposed multifocal moderate stenosis. Bilateral posterior communicating arteries present. No large vessel occlusion, flow limiting stenosis,  aneurysm. ANATOMIC VARIANTS: Hypoplastic RIGHT P1 segment. Source images and MIP images were reviewed. IMPRESSION: MRI HEAD: 1. No acute intracranial process. 2. Advanced white matter changes for age could reflect chronic small vessel ischemic  disease though given distribution, CADASIL is possible. 3. 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 Old LEFT basal ganglion lacunar infarct. Old LEFT cerebellar infarct. MRA HEAD: 1. No emergent large vessel occlusion or flow-limiting stenosis. 2. Intracranial atherosclerosis with multifocal moderate cerebral artery stenoses. Electronically Signed   By: Elon Alas M.D.   On: 01/22/2018 17:33      Medications:   Impression:  Active Problems:   Essential hypertension, benign   Hyperlipidemia   Chronic back pain   Non compliance w medication regimen   Right hemiparesis (HCC)   Malignant hypertension   Nonspecific chest pain   Sensory disturbance     Plan: IV Solumedrol 1000 mg over 24 hours x 2days   Consultants: Neurology     Procedures   Antibiotics:          Time spent:30 min   LOS: 0 days   Regginald Pask M   01/24/2018, 11:58 AM

## 2018-01-24 NOTE — Plan of Care (Signed)
  Problem: Education: Goal: Knowledge of General Education information will improve Outcome: Progressing   Problem: Health Behavior/Discharge Planning: Goal: Ability to manage health-related needs will improve Outcome: Progressing   Problem: Coping: Goal: Level of anxiety will decrease Outcome: Progressing   Problem: Pain Managment: Goal: General experience of comfort will improve Outcome: Progressing   Problem: Safety: Goal: Ability to remain free from injury will improve Outcome: Progressing   

## 2018-01-25 LAB — BASIC METABOLIC PANEL
Anion gap: 9 (ref 5–15)
BUN: 43 mg/dL — AB (ref 6–20)
CO2: 24 mmol/L (ref 22–32)
Calcium: 8.6 mg/dL — ABNORMAL LOW (ref 8.9–10.3)
Chloride: 107 mmol/L (ref 101–111)
Creatinine, Ser: 2.58 mg/dL — ABNORMAL HIGH (ref 0.44–1.00)
GFR calc Af Amer: 22 mL/min — ABNORMAL LOW (ref >60)
GFR calc non Af Amer: 19 mL/min — ABNORMAL LOW (ref >60)
Glucose, Bld: 97 mg/dL (ref 65–99)
Potassium: 3.8 mmol/L (ref 3.5–5.1)
Sodium: 140 mmol/L (ref 135–145)

## 2018-01-25 LAB — RPR, QUANT+TP ABS (REFLEX)
Rapid Plasma Reagin, Quant: 1:2 {titer} — ABNORMAL HIGH
TREPONEMA PALLIDUM AB: NEGATIVE

## 2018-01-25 LAB — RPR: RPR Ser Ql: REACTIVE — AB

## 2018-01-25 MED ORDER — LABETALOL HCL 200 MG PO TABS
300.0000 mg | ORAL_TABLET | Freq: Two times a day (BID) | ORAL | Status: DC
  Administered 2018-01-25 – 2018-01-28 (×6): 300 mg via ORAL
  Filled 2018-01-25 (×6): qty 2

## 2018-01-25 MED ORDER — SIMETHICONE 80 MG PO CHEW
160.0000 mg | CHEWABLE_TABLET | Freq: Four times a day (QID) | ORAL | Status: DC | PRN
  Administered 2018-01-25 – 2018-01-28 (×6): 160 mg via ORAL
  Filled 2018-01-25 (×6): qty 2

## 2018-01-25 NOTE — Progress Notes (Signed)
Subjective: She says she feels better.  Her blood pressure is better.  She still has some weakness of her right arm but that is improved.  She is complaining of pain in her lower back now and has had a fusion about a year ago.  She is also complaining of significant flatulence.  Objective: Vital signs in last 24 hours: Temp:  [97.7 F (36.5 C)-98.4 F (36.9 C)] 98.4 F (36.9 C) (06/23 0630) Pulse Rate:  [67-82] 67 (06/23 0630) Resp:  [15-18] 16 (06/23 0630) BP: (149-249)/(57-105) 169/66 (06/23 0630) SpO2:  [95 %-100 %] 100 % (06/23 1017) Weight change:  Last BM Date: 01/23/18  Intake/Output from previous day: 06/22 0701 - 06/23 0700 In: 802.8 [P.O.:720; I.V.:82.8] Out: 1300 [Urine:1300]  PHYSICAL EXAM General appearance: alert, cooperative and no distress Resp: clear to auscultation bilaterally Cardio: regular rate and rhythm, S1, S2 normal, no murmur, click, rub or gallop GI: soft, non-tender; bowel sounds normal; no masses,  no organomegaly Extremities: extremities normal, atraumatic, no cyanosis or edema  Lab Results:  Results for orders placed or performed during the hospital encounter of 01/22/18 (from the past 48 hour(s))  Vitamin B12     Status: None   Collection Time: 01/23/18  4:36 PM  Result Value Ref Range   Vitamin B-12 448 180 - 914 pg/mL    Comment: (NOTE) This assay is not validated for testing neonatal or myeloproliferative syndrome specimens for Vitamin B12 levels. Performed at [facility], [place] [address] [place], [place] [address]   C-reactive protein     Status: None   Collection Time: 01/23/18  4:36 PM  Result Value Ref Range   CRP <0.8 <1.0 mg/dL    Comment: Performed at [facility], [place] [address] [place], [place] [address]  Sedimentation rate     Status: Abnormal   Collection Time: 01/23/18  4:36 PM  Result Value Ref Range   Sed Rate 70 (H) 0 - 22 mm/hr    Comment: Performed at [facility], [address], [place], [place]  [address]  Urinalysis, Complete w Microscopic     Status: Abnormal   Collection Time: 01/24/18 11:40 AM  Result Value Ref Range   Color, Urine YELLOW YELLOW   APPearance CLEAR CLEAR   Specific Gravity, Urine 1.012 1.005 - 1.030   pH 5.0 5.0 - 8.0   Glucose, UA NEGATIVE NEGATIVE mg/dL   Hgb urine dipstick NEGATIVE NEGATIVE   Bilirubin Urine NEGATIVE NEGATIVE   Ketones, ur NEGATIVE NEGATIVE mg/dL   Protein, ur NEGATIVE NEGATIVE mg/dL   Nitrite NEGATIVE NEGATIVE   Leukocytes, UA NEGATIVE NEGATIVE   RBC / HPF 0-5 0 - 5 RBC/hpf   WBC, UA 0-5 0 - 5 WBC/hpf   Bacteria, UA RARE (A) NONE SEEN   Squamous Epithelial / LPF 0-5 0 - 5    Comment: Performed at [facility], [address], [place], [place] [address]  Basic metabolic panel     Status: Abnormal   Collection Time: 01/25/18  6:19 AM  Result Value Ref Range   Sodium 140 135 - 145 mmol/L   Potassium 3.8 3.5 - 5.1 mmol/L   Chloride 107 101 - 111 mmol/L   CO2 24 22 - 32 mmol/L   Glucose, Bld 97 65 - 99 mg/dL   BUN 43 (H) 6 - 20 mg/dL   Creatinine, Ser 2.58 (H) 0.44 - 1.00 mg/dL   Calcium 8.6 (L) 8.9 - 10.3 mg/dL   GFR calc non  Af Amer 19 (L) >60 mL/min   GFR calc Af Amer 22 (L) >60 mL/min    Comment: (NOTE) The eGFR has been calculated using the CKD EPI equation. This calculation has not been validated in all clinical situations. eGFR's persistently <60 mL/min signify possible Chronic Kidney Disease.    Anion gap 9 5 - 15    Comment: Performed at [facility], [address], [place], [place] [address]    [facility] Recent Labs    01/22/18 1034  TCO2 25   CULTURES No results found for this or any previous visit (from the past 240 hour(s)). Studies/Results: Mr Cervical Spine Wo Contrast  Result Date: 01/23/2018 CLINICAL DATA:  Demyelinating disease with spinal cord symptoms. EXAM: MRI CERVICAL SPINE WITHOUT CONTRAST TECHNIQUE: Multiplanar, multisequence MR imaging of the cervical spine was performed. No intravenous contrast was  administered. COMPARISON:  None. FINDINGS: Alignment: Mild retrolisthesis C3-4. Vertebrae: Normal bone marrow.  Negative for fracture or mass Cord: Normal spinal cord.  No cord lesions identified. Posterior Fossa, vertebral arteries, paraspinal tissues: Negative Disc levels: C2-3: Negative C3-4: Central disc protrusion with cord flattening and mild spinal stenosis. Neural foramina patent bilaterally C4-5: Small central disc protrusion with mild spinal stenosis. Neural foramina patent bilaterally. C5-6: Disc degeneration and mild uncinate spurring diffusely. No significant spinal or foraminal stenosis. C6-7: Diffuse uncinate spurring causing mild foraminal stenosis bilaterally. Spinal canal adequate in size C7-T1: Negative IMPRESSION: Cervical spinal cord appears normal without demyelinating disease. Cervical spine degenerative change causing mild spinal and foraminal stenosis as above. Electronically Signed   By: Franchot Gallo M.D.   On: 01/23/2018 16:05   US Carotid Bilateral (at [facility] And Ap Only)  Result Date: 01/23/2018 CLINICAL DATA:  Bilateral hand hand toes numbness with tingling for 2 days. Dysarthria. EXAM: BILATERAL CAROTID DUPLEX ULTRASOUND TECHNIQUE: Pearline Cables scale imaging, color Doppler and duplex ultrasound were performed of bilateral carotid and vertebral arteries in the neck. COMPARISON:  None. FINDINGS: Criteria: Quantification of carotid stenosis is based on velocity parameters that correlate the residual internal carotid diameter with NASCET-based stenosis levels, using the diameter of the distal internal carotid lumen as the denominator for stenosis measurement. The following velocity measurements were obtained: RIGHT ICA:  149 cm/sec CCA:  481 cm/sec SYSTOLIC ICA/CCA RATIO:  0.9 DIASTOLIC ICA/CCA RATIO: ECA:  105 cm/sec LEFT ICA:  111 cm/sec CCA:  856 cm/sec SYSTOLIC ICA/CCA RATIO:  0.6 DIASTOLIC ICA/CCA RATIO: ECA:  139 cm/sec RIGHT CAROTID ARTERY: Moderate smooth soft plaque in the bulb. Low  resistance internal carotid Doppler pattern is preserved. RIGHT VERTEBRAL ARTERY:  Antegrade. LEFT CAROTID ARTERY: Mild smooth soft plaque in the bulb. Low resistance internal carotid Doppler pattern is preserved. LEFT VERTEBRAL ARTERY:  Antegrade. IMPRESSION: 50-69% stenosis in the right internal carotid artery. Less than 50% stenosis in the left internal carotid artery. Electronically Signed   By: Marybelle Killings M.D.   On: 01/23/2018 11:56    Medications:  Prior to Admission:  Medications Prior to Admission  Medication Sig Dispense Refill Last Dose  . acetaminophen (TYLENOL) 500 MG tablet Take 1,000 mg by mouth every 6 (six) hours as needed for mild pain.   Past Month at Unknown time  . amLODipine (NORVASC) 10 MG tablet Take 10 mg by mouth daily.   01/21/2018 at 0900  . Ascorbic Acid (VITAMIN C PO) Take 1 tablet by mouth daily.    01/21/2018 at 0900  . azithromycin (ZITHROMAX) 250 MG tablet Take 1-2 tablets by mouth as directed. Take 2  tablets on Day 1, take 1 tablet on days 2-5   01/21/2018 at Unknown time  . Camphor-Menthol-Methyl Sal (HM SALONPAS PAIN RELIEF EX) Apply 1 patch topically daily as needed (pain).   Past Month at Unknown time  . Ferrous Sulfate (IRON) 28 MG TABS Take 28 mg by mouth daily.   01/21/2018 at 0900  . folic acid (FOLVITE) 818 MCG tablet Take 400 mcg by mouth daily.   01/21/2018 at 0900  . gabapentin (NEURONTIN) 300 MG capsule Take 1 capsule (300 mg total) by mouth 3 (three) times daily. 1 tablet in BID and 2 at bedtime (Patient taking differently: Take 300-600 mg by mouth 2 (two) times daily. Patient takes '300mg'$  in the morning and '600mg'$  at bedtime) 120 capsule 2 01/21/2018 at 2000  . labetalol (NORMODYNE) 300 MG tablet Take 300 mg by mouth 2 (two) times daily.    01/21/2018 at 2000  . lisinopril-hydrochlorothiazide (PRINZIDE,ZESTORETIC) 20-12.5 MG tablet Take 1 tablet by mouth daily.   01/21/2018 at 0900  . Multiple Vitamin (MULTIVITAMIN WITH MINERALS) TABS tablet Take 1 tablet by  mouth daily.   01/21/2018 at 0900  . naproxen sodium (ALEVE) 220 MG tablet Take 440 mg by mouth daily as needed.   01/21/2018 at 2000  . oxyCODONE-acetaminophen (PERCOCET/ROXICET) 5-325 MG tablet Take 1 tablet by mouth every 6 (six) hours as needed for severe pain.   Past Week at Unknown time  . oxymetazoline (AFRIN) 0.05 % nasal spray Place 1 spray into both nostrils 2 (two) times daily as needed for congestion.     . simvastatin (ZOCOR) 40 MG tablet Take 40 mg by mouth daily.   01/21/2018 at 200  . Tetrahydrozoline-Zn Sulfate (EYE DROPS IRRITATION RELIEF OP) Apply 1 drop to eye 2 (two) times daily as needed (allergies/irritation).   Past Month at Unknown time  . traZODone (DESYREL) 50 MG tablet Take 1 tablet by mouth at bedtime as needed.  4 01/21/2018 at 2000  . vitamin E 200 UNIT capsule Take 200 Units by mouth every evening.   01/21/2018 at 2000   Scheduled: . amLODipine  5 mg Oral Daily  . clopidogrel  75 mg Oral Once  . clopidogrel  75 mg Oral Daily  . dexamethasone  8 mg Intravenous Once  . enoxaparin (LOVENOX) injection  30 mg Subcutaneous Q24H  . ferrous sulfate  325 mg Oral Daily  . gabapentin  300 mg Oral Daily  . gabapentin  600 mg Oral QHS  . labetalol  300 mg Oral BID  . simvastatin  40 mg Oral q1800  . vitamin E  400 Units Oral QPM   Continuous: . sodium chloride 10 mL/hr at 01/24/18 1750   EXH:[id number] **OR** acetaminophen (TYLENOL) oral liquid 160 mg/5 mL **OR** acetaminophen, hydrALAZINE, oxyCODONE-acetaminophen, senna-docusate, simethicone, traZODone  Assesment: She has hypertension and its better but not totally controlled.  She has right hemiparesis and some of it may be related to disc disease particularly since she is having more back pain that radiates into her legs  She is complaining of flatulence Active Problems:   Essential hypertension, benign   Hyperlipidemia   Chronic back pain   Non compliance w medication regimen   Right hemiparesis (HCC)    Malignant hypertension   Nonspecific chest pain   Sensory disturbance    Plan: Continue treatments.  Check MRI of the lumbar spine.  Increase labetalol to 300 mg twice daily.  Add simethicone.    LOS: 0 days   Valisa Karpel L 01/25/2018, 10:20  AM

## 2018-01-25 NOTE — Progress Notes (Signed)
Physical Therapy Treatment Patient Details Name: Joyce [name] MRN: [id number] DOB: 22-Jun-1956 Today's Date: 01/25/2018    History of Present Illness Joyce [name] is a 62 y.o. female with medical history of hypertension, hyperlipidemia, chronic back pain, CKD stage III-4, and stroke presented with numbness and tingling of her bilateral toes and hands that started at 9 PM on 01/21/2018.  Patient felt that her symptoms may get better if she slept on it.  When she woke up on morning of 01/22/2018, the patient continued to have numbness and tingling in her bilateral hands and feet.  She called [place] [facility] RN whom advised the patient to go to the ED. upon arrival to the emergency department, the patient felt worsening numbness and tingling on her right upper extremity and right lower extremity and subsequently developed hemiparesis on the right side.  She stated that she began having dysarthria and word finding difficulties while in the emergency department.  In addition, she developed left-sided chest pain that was sharp in nature and moderate in intensity during her stay in the emergency department without any worsening shortness of breath.  She denied any visual disturbance, shortness breath, nausea, vomiting, diarrhea, abdominal pain, dysuria, hematuria.  She denies any hematochezia or melena.    PT Comments    Patient mostly limited for gait training today due to right sided low back pain with radiation to right hip/knee, required hand held assist to make it back to room and continued sitting up in chair after therapy.  Patient will benefit from continued physical therapy in hospital and recommended venue below to increase strength, balance, endurance for safe ADLs and gait.   Follow Up Recommendations  Home health PT;Supervision for mobility/OOB;Outpatient PT     Equipment Recommendations  Other (comment)(replacement quad-cane)    Recommendations for Other Services       Precautions /  Restrictions Precautions Precautions: Fall Restrictions Weight Bearing Restrictions: No    Mobility  Bed Mobility               General bed mobility comments: Patient presents seated in chair  Transfers Overall transfer level: Needs assistance Equipment used: Quad cane;1 person hand held assist Transfers: Sit to/from [facility] Sit to Stand: Min guard Stand pivot transfers: Min guard       General transfer comment: slow labored movement  Ambulation/Gait Ambulation/Gait assistance: Herbalist (Feet): 45 Feet Assistive device: Quad cane Gait Pattern/deviations: Decreased step length - right;Decreased stance time - right;Decreased stride length Gait velocity: slow   General Gait Details: slow labored cadence using quad-cane in left hand, mostly 3 point gait pattern, required hand held assist with RUE and use of quad-cane left hand to make it back to room due to c/o severe RLE pain   Stairs             Wheelchair Mobility    Modified Rankin (Stroke Patients Only)       Balance Overall balance assessment: Needs assistance Sitting-balance support: Feet supported;No upper extremity supported Sitting balance-Leahy Scale: Good     Standing balance support: Single extremity supported;During functional activity Standing balance-Leahy Scale: Fair Standing balance comment: using quad cane                            Cognition Arousal/Alertness: Awake/alert Behavior During Therapy: WFL for tasks assessed/performed Overall Cognitive Status: Within Functional Limits for tasks assessed  Exercises General Exercises - Lower Extremity Long Arc Quad: Seated;AROM;AAROM;Strengthening;Both;5 reps Toe Raises: Seated;AROM;Strengthening;Left;10 reps Heel Raises: Seated;AROM;Strengthening;Left;10 reps    General Comments        Pertinent Vitals/Pain Pain Score: 9  Pain  Location: central with radiation to right side of low back and hip Pain Descriptors / Indicators: Numbness;Sore;Aching Pain Intervention(s): Limited activity within patient's tolerance;Monitored during session    Home Living                      Prior Function            PT Goals (current goals can now be found in the care plan section) Acute Rehab PT Goals Patient Stated Goal: return home with spouse to assist PT Goal Formulation: With patient/family Time For Goal Achievement: 01/30/18 Potential to Achieve Goals: Good Progress towards PT goals: Progressing toward goals    Frequency    7X/week      PT Plan Current plan remains appropriate    Co-evaluation PT/OT/SLP Co-Evaluation/Treatment: Yes            AM-PAC PT "6 Clicks" Daily Activity  Outcome Measure  Difficulty turning over in bed (including adjusting bedclothes, sheets and blankets)?: None Difficulty moving from lying on back to sitting on the side of the bed? : None Difficulty sitting down on and standing up from a chair with arms (e.g., wheelchair, bedside commode, etc,.)?: A Little Help needed moving to and from a bed to chair (including a wheelchair)?: A Little Help needed walking in hospital room?: A Little Help needed climbing 3-5 steps with a railing? : A Little 6 Click Score: 20    End of Session   Activity Tolerance: Patient tolerated treatment well;Patient limited by pain Patient left: in chair;with call bell/phone within reach;with family/visitor present Nurse Communication: Mobility status PT Visit Diagnosis: Unsteadiness on feet (R26.81);Other abnormalities of gait and mobility (R26.89);Muscle weakness (generalized) (M62.81)     Time: 5997-7414 PT Time Calculation (min) (ACUTE ONLY): 31 min  Charges:  $Gait Training: 8-22 mins $Therapeutic Exercise: 8-22 mins                    G Codes:       1:16 PM, Jan 29, 2018 Lonell Grandchild, MPT Physical Therapist with [facility] 336 [phone] office [phone] mobile phone

## 2018-01-25 NOTE — Progress Notes (Signed)
Wrong time documented. Reassessment was done at 1426

## 2018-01-26 ENCOUNTER — Observation Stay (HOSPITAL_COMMUNITY): Payer: Medicare Other

## 2018-01-26 LAB — BASIC METABOLIC PANEL
ANION GAP: 10 (ref 5–15)
BUN: 36 mg/dL — ABNORMAL HIGH (ref 6–20)
CALCIUM: 8.6 mg/dL — AB (ref 8.9–10.3)
CO2: 22 mmol/L (ref 22–32)
CREATININE: 2.15 mg/dL — AB (ref 0.44–1.00)
Chloride: 108 mmol/L (ref 101–111)
GFR calc Af Amer: 27 mL/min — ABNORMAL LOW (ref >60)
GFR, EST NON AFRICAN AMERICAN: 23 mL/min — AB (ref >60)
GLUCOSE: 79 mg/dL (ref 65–99)
Potassium: 3.9 mmol/L (ref 3.5–5.1)
Sodium: 140 mmol/L (ref 135–145)

## 2018-01-26 LAB — ANTINUCLEAR ANTIBODIES, IFA: ANTINUCLEAR ANTIBODIES, IFA: NEGATIVE

## 2018-01-26 LAB — HOMOCYSTEINE: HOMOCYSTEINE-NORM: 25.2 umol/L — AB (ref 0.0–15.0)

## 2018-01-26 MED ORDER — METHYLPREDNISOLONE SODIUM SUCC 1000 MG IJ SOLR
INTRAMUSCULAR | Status: AC
  Filled 2018-01-26: qty 8

## 2018-01-26 MED ORDER — SODIUM CHLORIDE 0.9 % IV SOLN
1000.0000 mg | INTRAVENOUS | Status: DC
  Administered 2018-01-26 – 2018-01-28 (×3): 1000 mg via INTRAVENOUS
  Filled 2018-01-26 (×4): qty 8

## 2018-01-26 MED ORDER — ENOXAPARIN SODIUM 60 MG/0.6ML ~~LOC~~ SOLN
50.0000 mg | SUBCUTANEOUS | Status: DC
  Administered 2018-01-26 – 2018-01-28 (×3): 50 mg via SUBCUTANEOUS
  Filled 2018-01-26 (×3): qty 0.6

## 2018-01-26 NOTE — Progress Notes (Addendum)
[place] A. Merlene Laughter, MD     [email or web address]          JESENIA SPERA is an 62 y.o. female.   Assessment/Plan:  1.  Acute right-sided symptoms of weakness and numbness with MRI findings showing numerous white matter lesions that is concerning: Differential diagnosis includes multiple sclerosis, vasculitis, cocaine induced vasculopathy and severe case of hypertensive changes from poorly controlled hypertension.  It is also possible that the patient may have a MRI negative infarct.  Patient has been started on Plavix.  Blood pressure control is warranted. UPDATE -lumbar spine MRI shows marked spondylosis causing moderate to severe central canal stenosis.  She has improved with high-dose steroids.  Continue with the treatment.  No need for weaning course of steroids.  2.  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  We will continue to follow this.  She may need to have a spinal tap to be more certain to rule out neurosarcoidosis but she currently is on Plavix and will not be able to have this done.  We will continue to follow this in the office.  Follow-up in the office with [place] in 1 month.  3. Extensive white matter lesions: Differential as stated above.    4.  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  Prior neurological events of right-sided weakness in 1981, blindness in 1982 and confusion.  These findings are concerning for possible demyelinating syndromes.  6.  Bifrontal headaches:   She reports she has improved with high-dose steroids.  She reports significant improvement of the right upper extremity right leg.  She apparently is ambulating in the [place] with her cane a walker. She complains of having bifrontal headaches.  She has had these headaches before coming to the hospital but she reports them being worse in the hospital.  She thinks that it could be due to psychosocial stressors related to  home.     GENERAL: This a pleasant overweight female who is in no acute distress.  HEENT: No trauma appreciated and neck is supple.  ABDOMEN: soft  EXTREMITIES: No edema; marked arthritic changes of the knees bilaterally.  There is post operative changes from total left knee arthroplasty.  BACK: Normal  SKIN: Normal by inspection.    MENTAL STATUS: Alert and oriented; Speech, language.  CRANIAL NERVES: Pupils are 4 mm on the right and 3 on the left, round and reactive to light and accomodation; extra ocular movements are full, there is mild torsional nystagmus on upgaze; visual fields are full; upper and lower facial muscles are normal in strength and symmetric, there is no flattening of the nasolabial folds; tongue is midline; uvula is midline; shoulder elevation is normal.  MOTOR: Right upper extremity shows moderate impairment of hand dexterity including rapid hand closing maneuver and finger tapping.  Direct strength is actually good.  No drift of the upper extremities.  Right leg hip flexion is 3-/5 and dorsiflexion 4-/5.  Left side shows normal tone, bulk and strength without any drift.  COORDINATION: Left finger to nose is normal, right finger to nose is normal, No rest tremor; no intention tremor; no postural tremor; no bradykinesia.  REFLEXES: Deep tendon reflexes are symmetrical and normal. Plantar reflexes are flexor bilaterally.           Objective: Vital signs in last 24 hours: Temp:  [98.1 F (36.7 C)-99 F (37.2 C)] 98.8 F (37.1 C) (06/24 1812)  Pulse Rate:  [57-68] 68 (06/24 1812) Resp:  [15-18] 18 (06/24 1005) BP: (141-166)/(55-73) 165/68 (06/24 1812) SpO2:  [100 %] 100 % (06/24 1812)  Intake/Output from previous day: 06/23 0701 - 06/24 0700 In: 1354.2 [P.O.:1080; I.V.:274.2] Out: -  Intake/Output this shift: No intake/output data recorded. Nutritional status:  Diet Order           Diet Heart Room service appropriate? Yes; Fluid  consistency: Thin  Diet effective now           Lab Results: Results for orders placed or performed during the hospital encounter of 01/22/18 (from the past 48 hour(s))  Basic metabolic panel     Status: Abnormal   Collection Time: 01/25/18  6:19 AM  Result Value Ref Range   Sodium 140 135 - 145 mmol/L   Potassium 3.8 3.5 - 5.1 mmol/L   Chloride 107 101 - 111 mmol/L   CO2 24 22 - 32 mmol/L   Glucose, Bld 97 65 - 99 mg/dL   BUN 43 (H) 6 - 20 mg/dL   Creatinine, Ser 2.58 (H) 0.44 - 1.00 mg/dL   Calcium 8.6 (L) 8.9 - 10.3 mg/dL   GFR calc non Af Amer 19 (L) >60 mL/min   GFR calc Af Amer 22 (L) >60 mL/min    Comment: (NOTE) The eGFR has been calculated using the CKD EPI equation. This calculation has not been validated in all clinical situations. eGFR's persistently <60 mL/min signify possible Chronic Kidney Disease.    Anion gap 9 5 - 15    Comment: Performed at [facility], [address] [place], [place] [address]  Basic metabolic panel     Status: Abnormal   Collection Time: 01/26/18  4:03 AM  Result Value Ref Range   Sodium 140 135 - 145 mmol/L   Potassium 3.9 3.5 - 5.1 mmol/L   Chloride 108 101 - 111 mmol/L   CO2 22 22 - 32 mmol/L   Glucose, Bld 79 65 - 99 mg/dL   BUN 36 (H) 6 - 20 mg/dL   Creatinine, Ser 2.15 (H) 0.44 - 1.00 mg/dL   Calcium 8.6 (L) 8.9 - 10.3 mg/dL   GFR calc non Af Amer 23 (L) >60 mL/min   GFR calc Af Amer 27 (L) >60 mL/min    Comment: (NOTE) The eGFR has been calculated using the CKD EPI equation. This calculation has not been validated in all clinical situations. eGFR's persistently <60 mL/min signify possible Chronic Kidney Disease.    Anion gap 10 5 - 15    Comment: Performed at [facility], [address], [place], [place] [address]    Lipid Panel No results for input(s): CHOL, TRIG, HDL, CHOLHDL, VLDL, LDLCALC in the last 72 hours.  Studies/Results:   L SPINE MRI FINDINGS: Segmentation: 5 non rib-bearing lumbar type vertebral  bodies are present. The lowest fully formed vertebral body is L5.  Alignment: Slight retrolisthesis at L2-3 is stable. AP alignment is otherwise anatomic.  Vertebrae:  Fusion hardware obscures marrow signal L3-S1.  Conus medullaris and cauda equina: Conus extends to the L1 level. Conus and cauda equina appear normal.  Paraspinal and other soft tissues: Limited imaging the abdomen is unremarkable. There is no significant adenopathy. Paraspinous musculature is within normal limits.  Disc levels:  L1-2: Mild facet hypertrophy is similar the prior study. There is slight disc bulging without focal stenosis.  L2-3: Adjacent level disease has significantly progressed. A broad-based disc protrusion is present. Advanced facet hypertrophy is now  present. The combination results in moderate central canal stenosis with subarticular narrowing bilaterally, left greater than right. Moderate foraminal narrowing has also progressed.  L3-4: Discectomy and fusion is new since the prior study. There is decompression of the central canal and foramina. No residual or recurrent stenosis is present.  L4-5: Established fusion is stable. No residual recurrent stenosis is present.  L5-S1: Established fusion is stable. Left laminectomy is again noted. The central canal and foramina are patent.  IMPRESSION: 1. Extension of lumbar fusion to include L3-4 with decompression of the central canal and foramina at L3-4. 2. Progressive adjacent level disease at L2-3 with now moderate central and bilateral foraminal stenosis. 3. Stable appearance of mild facet hypertrophy and slight disc bulging at L1-2 without significant interval change.     C SPINE MRI FINDINGS: Alignment: Mild retrolisthesis C3-4.  Vertebrae: Normal bone marrow.  Negative for fracture or mass  Cord: Normal spinal cord.  No cord lesions identified.  Posterior Fossa, vertebral arteries, paraspinal tissues:  Negative  Disc levels:  C2-3: Negative  C3-4: Central disc protrusion with cord flattening and mild spinal stenosis. Neural foramina patent bilaterally  C4-5: Small central disc protrusion with mild spinal stenosis. Neural foramina patent bilaterally.  C5-6: Disc degeneration and mild uncinate spurring diffusely. No significant spinal or foraminal stenosis.  C6-7: Diffuse uncinate spurring causing mild foraminal stenosis bilaterally. Spinal canal adequate in size  C7-T1: Negative  IMPRESSION: Cervical spinal cord appears normal without demyelinating disease.  Cervical spine degenerative change causing mild spinal and foraminal stenosis as above.         Medications:  Scheduled Meds: . amLODipine  5 mg Oral Daily  . clopidogrel  75 mg Oral Once  . clopidogrel  75 mg Oral Daily  . dexamethasone  8 mg Intravenous Once  . enoxaparin (LOVENOX) injection  50 mg Subcutaneous Q24H  . ferrous sulfate  325 mg Oral Daily  . gabapentin  300 mg Oral Daily  . gabapentin  600 mg Oral QHS  . labetalol  300 mg Oral BID  . simvastatin  40 mg Oral q1800  . vitamin E  400 Units Oral QPM   Continuous Infusions: . sodium chloride 10 mL/hr at 01/24/18 1750   PRN Meds:.acetaminophen **OR** acetaminophen (TYLENOL) oral liquid 160 mg/5 mL **OR** acetaminophen, hydrALAZINE, oxyCODONE-acetaminophen, senna-docusate, simethicone, traZODone     LOS: 0 days   Yan Pankratz A. Merlene Laughter, M.D.  Diplomate, Tax adviser of Psychiatry and Neurology ( Neurology).

## 2018-01-26 NOTE — Progress Notes (Signed)
Patient states she feels weak nearly fell ambulating yesterday she still has disproportionate right-sided hemiparesis arm greater than leg.  MRA MRI shows no significant vascular flow disturbance significant amount of white matter noted sounds show 50 to 69% internal carotid stenosis.  Will await opinion from neurology as to etiology and further therapy and whether this can be conducted as an outpatient or not?  Renal function improving with fluid resuscitation.  Systolic pressure improved with labetalol and Norvasc labetalol increased to 300 twice daily yesterday we will observe pressures at the moment will check be met in a.[name]. Joyce [name] GQB:[id number] DOB: 03-12-1956 DOA: 01/22/2018 PCP: Lucia Gaskins, MD   Physical Exam: Blood pressure (!) 161/73, pulse 64, temperature 98.1 F (36.7 C), temperature source Oral, resp. rate 18, height _0  (1.702 [name]), weight 101.6 kg (224 lb), last menstrual period 12/04/1994, SpO2 100 %.  Lungs clear to A&P no rales wheezes rhonchi heart regular rhythm no S3-S4 no heaves or rubs abdomen soft nontender bowel sounds normoactive right-sided arm strength 2 out of 5.  No significant cranial nerve deficits noted.   Investigations:  No results found for this or any previous visit (from the past 240 hour(s)).   Basic Metabolic Panel: Recent Labs    01/25/18 0619 01/26/18 0403  NA 140 140  K 3.8 3.9  CL 107 108  CO2 24 22  GLUCOSE 97 79  BUN 43* 36*  CREATININE 2.58* 2.15*  CALCIUM 8.6* 8.6*   Liver Function Tests: No results for input(s): AST, ALT, ALKPHOS, BILITOT, PROT, ALBUMIN in the last 72 hours.   CBC: No results for input(s): WBC, NEUTROABS, HGB, HCT, MCV, PLT in the last 72 hours.  Mr Lumbar Spine Wo Contrast  Result Date: 01/26/2018 CLINICAL DATA:  Low back pain extending into the lower extremities bilaterally. Symptoms worse over the last 2 weeks. Previous lumbar spine surgery. EXAM: MRI LUMBAR SPINE WITHOUT CONTRAST TECHNIQUE:  Multiplanar, multisequence MR imaging of the lumbar spine was performed. No intravenous contrast was administered. COMPARISON:  MRI lumbar spine 12/29/2015. FINDINGS: Segmentation: 5 non rib-bearing lumbar type vertebral bodies are present. The lowest fully formed vertebral body is L5. Alignment: Slight retrolisthesis at L2-3 is stable. AP alignment is otherwise anatomic. Vertebrae:  Fusion hardware obscures marrow signal L3-S1. Conus medullaris and cauda equina: Conus extends to the L1 level. Conus and cauda equina appear normal. Paraspinal and other soft tissues: Limited imaging the abdomen is unremarkable. There is no significant adenopathy. Paraspinous musculature is within normal limits. Disc levels: L1-2: Mild facet hypertrophy is similar the prior study. There is slight disc bulging without focal stenosis. L2-3: Adjacent level disease has significantly progressed. A broad-based disc protrusion is present. Advanced facet hypertrophy is now present. The combination results in moderate central canal stenosis with subarticular narrowing bilaterally, left greater than right. Moderate foraminal narrowing has also progressed. L3-4: Discectomy and fusion is new since the prior study. There is decompression of the central canal and foramina. No residual or recurrent stenosis is present. L4-5: Established fusion is stable. No residual recurrent stenosis is present. L5-S1: Established fusion is stable. Left laminectomy is again noted. The central canal and foramina are patent. IMPRESSION: 1. Extension of lumbar fusion to include L3-4 with decompression of the central canal and foramina at L3-4. 2. Progressive adjacent level disease at L2-3 with now moderate central and bilateral foraminal stenosis. 3. Stable appearance of mild facet hypertrophy and slight disc bulging at L1-2 without significant interval change. Electronically Signed   By:  [place] [name].D.   On: 01/26/2018 11:06       Medications:  Impression: *Active Problems:   Essential hypertension, benign   Hyperlipidemia   Chronic back pain   Non compliance w medication regimen   Right hemiparesis (HCC)   Malignant hypertension   Nonspecific chest pain   Sensory disturbance     Plan: Increase labetalol to 300 mg p.o. twice daily continue Norvasc 5 mg p.o. daily continue with physical therapy await neurology opinion as to inpatient or outpatient therapy.  Check be met in a.[name].  Consultants: Neurology   Procedures   Antibiotics:         Time spent: 30 minutes   LOS: 0 days   Joyce [name]   01/26/2018, 1:48 PM

## 2018-01-27 DIAGNOSIS — R0789 Other chest pain: Secondary | ICD-10-CM | POA: Diagnosis present

## 2018-01-27 DIAGNOSIS — R471 Dysarthria and anarthria: Secondary | ICD-10-CM | POA: Diagnosis present

## 2018-01-27 DIAGNOSIS — M4802 Spinal stenosis, cervical region: Secondary | ICD-10-CM | POA: Diagnosis present

## 2018-01-27 DIAGNOSIS — I129 Hypertensive chronic kidney disease with stage 1 through stage 4 chronic kidney disease, or unspecified chronic kidney disease: Secondary | ICD-10-CM | POA: Diagnosis present

## 2018-01-27 DIAGNOSIS — G8191 Hemiplegia, unspecified affecting right dominant side: Secondary | ICD-10-CM | POA: Diagnosis present

## 2018-01-27 DIAGNOSIS — R208 Other disturbances of skin sensation: Secondary | ICD-10-CM | POA: Diagnosis present

## 2018-01-27 DIAGNOSIS — Z9114 Patient's other noncompliance with medication regimen: Secondary | ICD-10-CM | POA: Diagnosis not present

## 2018-01-27 DIAGNOSIS — M5416 Radiculopathy, lumbar region: Secondary | ICD-10-CM | POA: Diagnosis present

## 2018-01-27 DIAGNOSIS — Z8349 Family history of other endocrine, nutritional and metabolic diseases: Secondary | ICD-10-CM | POA: Diagnosis not present

## 2018-01-27 DIAGNOSIS — E785 Hyperlipidemia, unspecified: Secondary | ICD-10-CM | POA: Diagnosis present

## 2018-01-27 DIAGNOSIS — G894 Chronic pain syndrome: Secondary | ICD-10-CM | POA: Diagnosis present

## 2018-01-27 DIAGNOSIS — E663 Overweight: Secondary | ICD-10-CM | POA: Diagnosis present

## 2018-01-27 DIAGNOSIS — I639 Cerebral infarction, unspecified: Secondary | ICD-10-CM | POA: Diagnosis present

## 2018-01-27 DIAGNOSIS — N184 Chronic kidney disease, stage 4 (severe): Secondary | ICD-10-CM | POA: Diagnosis present

## 2018-01-27 DIAGNOSIS — R7989 Other specified abnormal findings of blood chemistry: Secondary | ICD-10-CM | POA: Diagnosis present

## 2018-01-27 DIAGNOSIS — N179 Acute kidney failure, unspecified: Secondary | ICD-10-CM | POA: Diagnosis present

## 2018-01-27 DIAGNOSIS — Z981 Arthrodesis status: Secondary | ICD-10-CM | POA: Diagnosis not present

## 2018-01-27 DIAGNOSIS — M4800 Spinal stenosis, site unspecified: Secondary | ICD-10-CM | POA: Diagnosis present

## 2018-01-27 DIAGNOSIS — Z6835 Body mass index (BMI) 35.0-35.9, adult: Secondary | ICD-10-CM | POA: Diagnosis not present

## 2018-01-27 DIAGNOSIS — Z87891 Personal history of nicotine dependence: Secondary | ICD-10-CM | POA: Diagnosis not present

## 2018-01-27 DIAGNOSIS — Z8249 Family history of ischemic heart disease and other diseases of the circulatory system: Secondary | ICD-10-CM | POA: Diagnosis not present

## 2018-01-27 DIAGNOSIS — M48061 Spinal stenosis, lumbar region without neurogenic claudication: Secondary | ICD-10-CM | POA: Diagnosis present

## 2018-01-27 LAB — BASIC METABOLIC PANEL
ANION GAP: 7 (ref 5–15)
BUN: 38 mg/dL — AB (ref 8–23)
CHLORIDE: 110 mmol/L (ref 98–111)
CO2: 22 mmol/L (ref 22–32)
Calcium: 8.7 mg/dL — ABNORMAL LOW (ref 8.9–10.3)
Creatinine, Ser: 1.92 mg/dL — ABNORMAL HIGH (ref 0.44–1.00)
GFR calc Af Amer: 31 mL/min — ABNORMAL LOW (ref >60)
GFR, EST NON AFRICAN AMERICAN: 27 mL/min — AB (ref >60)
Glucose, Bld: 138 mg/dL — ABNORMAL HIGH (ref 70–99)
POTASSIUM: 4.4 mmol/L (ref 3.5–5.1)
Sodium: 139 mmol/L (ref 135–145)

## 2018-01-27 MED ORDER — CLONIDINE HCL 0.2 MG/24HR TD PTWK
0.2000 mg | MEDICATED_PATCH | TRANSDERMAL | Status: DC
  Administered 2018-01-27: 0.2 mg via TRANSDERMAL
  Filled 2018-01-27 (×2): qty 1

## 2018-01-27 NOTE — Progress Notes (Signed)
Patients BP elevated this AM to 212/98, PRN hydralazine given, on recheck, BP down to 182/77.

## 2018-01-27 NOTE — Progress Notes (Signed)
Appreciate Dr. Freddie Apley expertise patient currently on Solu-Medrol thousand milligrams every 24 hours for 48 consecutive hours blood pressure still elevated despite increasing antihypertensive regimen will now add Catapres-TTS-2.  Patient ambulating better using right arm more so than yesterday*Joyce [name] ZOX:[id number] DOB: Mar 14, 1956 DOA: 01/22/2018 PCP: Joyce Gaskins, MD   Physical Exam: Blood pressure (!) 182/77, pulse 72, temperature 98 F (36.7 C), temperature source Oral, resp. rate 18, height 5\' 7"  (1.702 [name]), weight 101.6 kg (224 lb), last menstrual period 12/04/1994, SpO2 99 %.  Lungs clear to A&P no rales wheeze rhonchi heart regular rhythm no S3-S4 no heaves thrills rubs abdomen soft nontender bowel sounds   Investigations:  No results found for this or any previous visit (from the past 240 hour(s)).   Basic Metabolic Panel: Recent Labs    01/26/18 0403 01/27/18 0443  NA 140 139  K 3.9 4.4  CL 108 110  CO2 22 22  GLUCOSE 79 138*  BUN 36* 38*  CREATININE 2.15* 1.92*  CALCIUM 8.6* 8.7*   Liver Function Tests: No results for input(s): AST, ALT, ALKPHOS, BILITOT, PROT, ALBUMIN in the last 72 hours.   CBC: No results for input(s): WBC, NEUTROABS, HGB, HCT, MCV, PLT in the last 72 hours.  Mr Lumbar Spine Wo Contrast  Result Date: 01/26/2018 CLINICAL DATA:  Low back pain extending into the lower extremities bilaterally. Symptoms worse over the last 2 weeks. Previous lumbar spine surgery. EXAM: MRI LUMBAR SPINE WITHOUT CONTRAST TECHNIQUE: Multiplanar, multisequence MR imaging of the lumbar spine was performed. No intravenous contrast was administered. COMPARISON:  MRI lumbar spine 12/29/2015. FINDINGS: Segmentation: 5 non rib-bearing lumbar type vertebral bodies are present. The lowest fully formed vertebral body is L5. Alignment: Slight retrolisthesis at L2-3 is stable. AP alignment is otherwise anatomic. Vertebrae:  Fusion hardware obscures marrow signal L3-S1. Conus  medullaris and cauda equina: Conus extends to the L1 level. Conus and cauda equina appear normal. Paraspinal and other soft tissues: Limited imaging the abdomen is unremarkable. There is no significant adenopathy. Paraspinous musculature is within normal limits. Disc levels: L1-2: Mild facet hypertrophy is similar the prior study. There is slight disc bulging without focal stenosis. L2-3: Adjacent level disease has significantly progressed. A broad-based disc protrusion is present. Advanced facet hypertrophy is now present. The combination results in moderate central canal stenosis with subarticular narrowing bilaterally, left greater than right. Moderate foraminal narrowing has also progressed. L3-4: Discectomy and fusion is new since the prior study. There is decompression of the central canal and foramina. No residual or recurrent stenosis is present. L4-5: Established fusion is stable. No residual recurrent stenosis is present. L5-S1: Established fusion is stable. Left laminectomy is again noted. The central canal and foramina are patent. IMPRESSION: 1. Extension of lumbar fusion to include L3-4 with decompression of the central canal and foramina at L3-4. 2. Progressive adjacent level disease at L2-3 with now moderate central and bilateral foraminal stenosis. 3. Stable appearance of mild facet hypertrophy and slight disc bulging at L1-2 without significant interval change. Electronically Signed   By: [place]lle [name].D.   On: 01/26/2018 11:06      Medications:   Impression:  Active Problems:   Essential hypertension, benign   Hyperlipidemia   Chronic back pain   Non compliance w medication regimen   Right hemiparesis (HCC)   Malignant hypertension   Nonspecific chest pain   Sensory disturbance   Central stenosis of spinal canal     Plan: Add Catapres-TTS-2 to Norvasc 10  and labetalol 300 p.o. twice daily continue Solu-Medrol thousand milligrams IV for 48 consecutive hours observe  neurologic status  Consultants: Neurology   Procedures   Antibiotics:       Time spent: 30 minutes   LOS: 0 days   Joyce [name]   01/27/2018, 12:05 PM

## 2018-01-27 NOTE — Progress Notes (Signed)
Physical Therapy Treatment Patient Details Name: Joyce [name] MRN: [id number] DOB: August 19, 1955 Today's Date: 01/27/2018    History of Present Illness Joyce [name] is a 62 y.o. female with medical history of hypertension, hyperlipidemia, chronic back pain, CKD stage III-4, and stroke presented with numbness and tingling of her bilateral toes and hands that started at 9 PM on 01/21/2018.  Patient felt that her symptoms may get better if she slept on it.  When she woke up on morning of 01/22/2018, the patient continued to have numbness and tingling in her bilateral hands and feet.  She called [facility] [place] [facility] RN whom advised the patient to go to the ED. upon arrival to the emergency department, the patient felt worsening numbness and tingling on her right upper extremity and right lower extremity and subsequently developed hemiparesis on the right side.  She stated that she began having dysarthria and word finding difficulties while in the emergency department.  In addition, she developed left-sided chest pain that was sharp in nature and moderate in intensity during her stay in the emergency department without any worsening shortness of breath.  She denied any visual disturbance, shortness breath, nausea, vomiting, diarrhea, abdominal pain, dysuria, hematuria.  She denies any hematochezia or melena.    PT Comments    Pt in bed and willing to work with therapy.  Discussed concerns regarding going to rehab vs home health therapy/OPT.  Pt able to complete all transfers independently with increased time.  Ambulated increased distance, only stopping to return to room due to fear of pain increasing and general fatigue.  Pt tends to shuffle Rt LE but also completes with normal advancement without cues.  Pt reluctant to use QC initially, wanting to return to walker, however explained she needed to challenge her balance with therapy.  Attempted therex in seated position, with ability to complete without difficulty on  LT LE, however required mostly passive on Rt as unable to move Rt LE.  Pt was able to move this extremity out of the way of therapist when walking by, however.                         Precautions / Restrictions Precautions Precautions: Fall Restrictions Weight Bearing Restrictions: No    Mobility  Bed Mobility Overal bed mobility: Modified Independent Bed Mobility: Supine to Sit     Supine to sit: Modified independent (Device/Increase time)        Transfers Overall transfer level: Needs assistance Equipment used: 1 person hand held assist;Quad cane Transfers: Sit to/from Stand Sit to Stand: Modified independent (Device/Increase time)         General transfer comment: slow labored movement  Ambulation/Gait Ambulation/Gait assistance: Min guard Gait Distance (Feet): 85 Feet Assistive device: Quad cane Gait Pattern/deviations: Decreased step length - right;Decreased stance time - right;Decreased stride length;Shuffle;Decreased weight shift to right     General Gait Details: slow cadence, shuffling of Rt LE at times.  Amb with QC in Lt UE and did not c/o pain in either LE         Cognition Arousal/Alertness: Awake/alert Behavior During Therapy: WFL for tasks assessed/performed Overall Cognitive Status: Within Functional Limits for tasks assessed                                        Exercises General Exercises - Lower Extremity Ankle Circles/Pumps:  PROM;AROM;AAROM;Both;5 reps;Seated Long Arc Quad: PROM;AROM;AAROM;Both;5 reps;Seated Hip Flexion/Marching: PROM;AROM;AAROM;Both;5 reps;Seated        Pertinent Vitals/Pain Pain Assessment: No/denies pain       Prior Function            PT Goals (current goals can now be found in the care plan section)      Frequency           PT Plan  continue progressing toward goals                                    End of Session Equipment Utilized During Treatment: Gait  belt Activity Tolerance: Patient tolerated treatment well;Patient limited by fatigue Patient left: in chair;with family/visitor present;with call bell/phone within reach         Time: 0830-0910 PT Time Calculation (min) (ACUTE ONLY): 40 min  Charges:  $Gait Training: 23-37 mins $Therapeutic Exercise: 8-22 mins                       Teena Irani, PTA/CLT [place], Amy B 01/27/2018, 1:00 PM

## 2018-01-28 LAB — BASIC METABOLIC PANEL
ANION GAP: 8 (ref 5–15)
BUN: 36 mg/dL — ABNORMAL HIGH (ref 8–23)
CALCIUM: 8.7 mg/dL — AB (ref 8.9–10.3)
CO2: 23 mmol/L (ref 22–32)
Chloride: 106 mmol/L (ref 98–111)
Creatinine, Ser: 1.65 mg/dL — ABNORMAL HIGH (ref 0.44–1.00)
GFR, EST AFRICAN AMERICAN: 37 mL/min — AB (ref >60)
GFR, EST NON AFRICAN AMERICAN: 32 mL/min — AB (ref >60)
Glucose, Bld: 143 mg/dL — ABNORMAL HIGH (ref 70–99)
POTASSIUM: 4.4 mmol/L (ref 3.5–5.1)
SODIUM: 137 mmol/L (ref 135–145)

## 2018-01-28 MED ORDER — METHYLPREDNISOLONE SODIUM SUCC 1000 MG IJ SOLR
INTRAMUSCULAR | Status: AC
  Filled 2018-01-28: qty 8

## 2018-01-28 MED ORDER — AMLODIPINE BESYLATE 5 MG PO TABS
10.0000 mg | ORAL_TABLET | Freq: Every day | ORAL | Status: DC
  Administered 2018-01-29: 10 mg via ORAL
  Filled 2018-01-28: qty 2

## 2018-01-28 MED ORDER — LABETALOL HCL 200 MG PO TABS
300.0000 mg | ORAL_TABLET | Freq: Three times a day (TID) | ORAL | Status: DC
  Administered 2018-01-28 – 2018-01-29 (×3): 300 mg via ORAL
  Filled 2018-01-28 (×3): qty 2

## 2018-01-28 NOTE — Progress Notes (Signed)
Physical Therapy Treatment Patient Details Name: Joyce [name] MRN: [id number] DOB: [birth year] Today's Date: 01/28/2018    History of Present Illness Joyce [name] is a 62 y.o. female with medical history of hypertension, hyperlipidemia, chronic back pain, CKD stage III-4, and stroke presented with numbness and tingling of her bilateral toes and hands that started at 9 PM on 01/21/2018.  Patient felt that her symptoms may get better if she slept on it.  When she woke up on morning of 01/22/2018, the patient continued to have numbness and tingling in her bilateral hands and feet.  She called [place] [facility] RN whom advised the patient to go to the ED. upon arrival to the emergency department, the patient felt worsening numbness and tingling on her right upper extremity and right lower extremity and subsequently developed hemiparesis on the right side.  She stated that she began having dysarthria and word finding difficulties while in the emergency department.  In addition, she developed left-sided chest pain that was sharp in nature and moderate in intensity during her stay in the emergency department without any worsening shortness of breath.  She denied any visual disturbance, shortness breath, nausea, vomiting, diarrhea, abdominal pain, dysuria, hematuria.  She denies any hematochezia or melena.    PT Comments    Pt supine in bed and willing to participate.  Pt improved to independent bed mobility and transfers.  Pt reports pain scale 8/10 LBP but stated she will wait for pain medication following treatment.  Pt presents with improved strengthening and activity tolerance with ability to increase movements with Rt LE and increased distance with gait training today.  Pt ambulated with QC to 140 ft with min guard and no LOB.  EOS pt left in chair with call bell within reach, chair alarm set and husband in room.  Pt able to complate AROM LE strengthening exercises in seated position with no AAneeded for Rt  LE.  RN aware of pt status and pt requested pain medication through RN.     Follow Up Recommendations  Home health PT;Supervision for mobility/OOB;Outpatient PT     Equipment Recommendations  (quad cane)    Recommendations for Other Services       Precautions / Restrictions Precautions Precautions: Fall Restrictions Weight Bearing Restrictions: No    Mobility  Bed Mobility Overal bed mobility: Independent             General bed mobility comments: Pt independent bed mobility  Transfers Overall transfer level: Modified independent Equipment used: Quad cane Transfers: Sit to/from Stand Sit to Stand: Modified independent (Device/Increase time) Stand pivot transfers: Min guard       General transfer comment: slow labored movement  Ambulation/Gait Ambulation/Gait assistance: Min guard Gait Distance (Feet): 140 Feet Assistive device: Quad cane Gait Pattern/deviations: Decreased step length - right;Decreased stance time - right;Decreased stride length;Shuffle;Decreased weight shift to right Gait velocity: slow though increased cadence this session.     General Gait Details: improved cadence, min cueing to reduce shuffling of Rt LE at times.  Amb with QC with no reports of increased pain during session.   Stairs             Wheelchair Mobility    Modified Rankin (Stroke Patients Only)       Balance  Cognition Arousal/Alertness: Awake/alert Behavior During Therapy: WFL for tasks assessed/performed Overall Cognitive Status: Within Functional Limits for tasks assessed                                        Exercises General Exercises - Lower Extremity Ankle Circles/Pumps: AROM;Both;15 reps;Seated Long Arc Quad: AROM;Both;15 reps;Seated Hip ABduction/ADduction: AROM;Both;10 reps;Seated Hip Flexion/Marching: AROM;Both;10 reps;Seated Toe Raises: AROM;Both;20  reps;Seated Heel Raises: AROM;Both;15 reps;Seated    General Comments        Pertinent Vitals/Pain Pain Assessment: 0-10 Pain Score: 8  Pain Location: central with radiation to right side of low back and hip Pain Descriptors / Indicators: Sore Pain Intervention(s): Limited activity within patient's tolerance;Monitored during session;Patient requesting pain meds-RN notified(Pt requested pain meds following PT session for LBP)    Home Living                      Prior Function            PT Goals (current goals can now be found in the care plan section) Progress towards PT goals: Progressing toward goals    Frequency    7X/week      PT Plan Current plan remains appropriate    Co-evaluation              AM-PAC PT "6 Clicks" Daily Activity  Outcome Measure  Difficulty turning over in bed (including adjusting bedclothes, sheets and blankets)?: None Difficulty moving from lying on back to sitting on the side of the bed? : None Difficulty sitting down on and standing up from a chair with arms (e.g., wheelchair, bedside commode, etc,.)?: A Little Help needed moving to and from a bed to chair (including a wheelchair)?: A Little Help needed walking in hospital room?: A Little Help needed climbing 3-5 steps with a railing? : A Little 6 Click Score: 20    End of Session Equipment Utilized During Treatment: Gait belt Activity Tolerance: Patient tolerated treatment well;Patient limited by fatigue Patient left: in chair;with family/visitor present;with call bell/phone within reach Nurse Communication: Mobility status(RN aware of status) PT Visit Diagnosis: Unsteadiness on feet (R26.81);Other abnormalities of gait and mobility (R26.89);Muscle weakness (generalized) (M62.81)     Time: 4034-7425 PT Time Calculation (min) (ACUTE ONLY): 30 min  Charges:  $Therapeutic Activity: 8-22 mins                    G Codes:       Ihor Austin, LPTA;  CBIS [phone]  Aldona Lento 01/28/2018, 12:57 PM

## 2018-01-28 NOTE — Progress Notes (Signed)
Patient has remarkably improved arm and right leg strength on Solu-Medrol Solu-Medrol will run until 10 PM tonight for 48 hours will consider discharge in a.m.  Renal function continues to improve creatinine now 1.65 Joyce [name] LOV:[id number] DOB: 30-Oct-1955 DOA: 01/22/2018 PCP: Lucia Gaskins, MD   Physical Exam: Blood pressure (!) 208/74, pulse 71, temperature 98.6 F (37 C), temperature source Oral, resp. rate 18, height 5\' 7"  (1.702 m), weight 101.6 kg (224 lb), last menstrual period 12/04/1994, SpO2 100 %.  Lungs clear to A&P no rales rhonchi heart regular rhythm no murmurs gallops heaves thrills or rubs   Investigations:  No results found for this or any previous visit (from the past 240 hour(s)).   Basic Metabolic Panel: Recent Labs    01/27/18 0443 01/28/18 0428  NA 139 137  K 4.4 4.4  CL 110 106  CO2 22 23  GLUCOSE 138* 143*  BUN 38* 36*  CREATININE 1.92* 1.65*  CALCIUM 8.7* 8.7*   Liver Function Tests: No results for input(s): AST, ALT, ALKPHOS, BILITOT, PROT, ALBUMIN in the last 72 hours.   CBC: No results for input(s): WBC, NEUTROABS, HGB, HCT, MCV, PLT in the last 72 hours.  No results found.    Medications:   Impression:  Active Problems:   Essential hypertension, benign   Hyperlipidemia   Chronic back pain   Non compliance w medication regimen   Right hemiparesis (HCC)   Malignant hypertension   Nonspecific chest pain   Sensory disturbance   Central stenosis of spinal canal     Plan: We will increase labetalol to 300 mg p.o. 3 times daily versus twice daily for better blood pressure control.  Continue Catapres-TTS-2 and Norvasc 10 mg p.o. daily  Consultants: Neurology   Procedures   Antibiotics:        Time spent: 30 minutes   LOS: 1 day   Joyce [name] M   01/28/2018, 11:59 AM

## 2018-01-29 LAB — BASIC METABOLIC PANEL
Anion gap: 9 (ref 5–15)
BUN: 47 mg/dL — ABNORMAL HIGH (ref 8–23)
CALCIUM: 8.6 mg/dL — AB (ref 8.9–10.3)
CHLORIDE: 105 mmol/L (ref 98–111)
CO2: 22 mmol/L (ref 22–32)
Creatinine, Ser: 1.76 mg/dL — ABNORMAL HIGH (ref 0.44–1.00)
GFR, EST AFRICAN AMERICAN: 35 mL/min — AB (ref >60)
GFR, EST NON AFRICAN AMERICAN: 30 mL/min — AB (ref >60)
Glucose, Bld: 134 mg/dL — ABNORMAL HIGH (ref 70–99)
POTASSIUM: 4.7 mmol/L (ref 3.5–5.1)
SODIUM: 136 mmol/L (ref 135–145)

## 2018-01-29 MED ORDER — CLOPIDOGREL BISULFATE 75 MG PO TABS: 75.0000 mg | ORAL_TABLET | Freq: Every day | ORAL | 3 refills | Status: DC

## 2018-01-29 MED ORDER — SALINE SPRAY 0.65 % NA SOLN
1.0000 | NASAL | Status: DC | PRN
  Administered 2018-01-29: 1 via NASAL
  Filled 2018-01-29: qty 44

## 2018-01-29 MED ORDER — LABETALOL HCL 300 MG PO TABS: 300.0000 mg | ORAL_TABLET | Freq: Three times a day (TID) | ORAL | 3 refills | Status: DC

## 2018-01-29 MED ORDER — LORATADINE 10 MG PO TABS
10.0000 mg | ORAL_TABLET | Freq: Every day | ORAL | Status: DC | PRN
  Administered 2018-01-29: 10 mg via ORAL
  Filled 2018-01-29: qty 1

## 2018-01-29 MED ORDER — PREDNISONE 50 MG PO TABS: ORAL_TABLET | ORAL | 0 refills | Status: DC

## 2018-01-29 MED ORDER — CLONIDINE 0.2 MG/24HR TD PTWK: 0.2000 mg | MEDICATED_PATCH | TRANSDERMAL | 12 refills | Status: DC

## 2018-01-29 MED ORDER — CLOPIDOGREL BISULFATE 75 MG PO TABS: 75.0000 mg | ORAL_TABLET | Freq: Once | ORAL | 0 refills | Status: AC

## 2018-01-29 NOTE — Discharge Summary (Signed)
Physician Discharge Summary  Joyce [name] WHQ:[id number] DOB: 29-Feb-1956 DOA: 01/22/2018  PCP: Lucia Gaskins, MD  Admit date: 01/22/2018 Discharge date: 01/29/2018   Recommendations for Outpatient Follow-up: The patient advised to take labetolol 300 mg. Po 3 x daily. Take prednisone 50 mg PO daily for 10 days Take increased norvasc dose of 10 mg. PO daily and as well as aspirin 325 mg PO daily and Plavix 75 mg PO daily . Follow up in my office in one weeks time   Discharge Diagnoses:  Active Problems:   Essential hypertension, benign   Hyperlipidemia   Chronic back pain   Non compliance w medication regimen   Right hemiparesis (HCC)   Malignant hypertension   Nonspecific chest pain   Sensory disturbance   Central stenosis of spinal canal   Discharge Condition: good  Filed Weights   01/22/18 1023 01/22/18 1858  Weight: 101.6 kg (224 lb) 101.6 kg (224 lb)    History of present illness:  T. Presented wit accelerated hyppertensin and new right hemiparesis. CT- MRA- MRI revealed no evidence of acute cva. Neurology consulted and saw increased white matter deposition she also had AKI . She had fluid resucitation and improvement of Creatinine. BP meds were titrated and increased . She was then treated with high dose solumedrol x 48 hours with resolution of most right sided weakness    Hospital Course:  See HPI above.  Procedures:    Consultations:  Neurology   Discharge Instructions  Discharge Instructions    Discharge instructions   Complete by:  As directed      Allergies as of 01/29/2018      Reactions   Lactose Intolerance (gi) Nausea And Vomiting   Aspirin Hives   Other Hives   [place] soap      Medication List    STOP taking these medications   acetaminophen 500 MG tablet Commonly known as:  TYLENOL   azithromycin 250 MG tablet Commonly known as:  ZITHROMAX   naproxen sodium 220 MG tablet Commonly known as:  ALEVE     TAKE these medications    amLODipine 10 MG tablet Commonly known as:  NORVASC Take 10 mg by mouth daily.   cloNIDine 0.2 mg/24hr patch Commonly known as:  CATAPRES - Dosed in mg/24 hr Place 1 patch (0.2 mg total) onto the skin once a week. Start taking on:  02/03/2018   clopidogrel 75 MG tablet Commonly known as:  PLAVIX Take 1 tablet (75 mg total) by mouth once for 1 dose.   clopidogrel 75 MG tablet Commonly known as:  PLAVIX Take 1 tablet (75 mg total) by mouth daily. Start taking on:  01/30/2018   EYE DROPS IRRITATION RELIEF OP Apply 1 drop to eye 2 (two) times daily as needed (allergies/irritation).   folic acid 659 MCG tablet Commonly known as:  FOLVITE Take 400 mcg by mouth daily.   gabapentin 300 MG capsule Commonly known as:  NEURONTIN Take 1 capsule (300 mg total) by mouth 3 (three) times daily. 1 tablet in BID and 2 at bedtime What changed:    how much to take  when to take this  additional instructions   HM SALONPAS PAIN RELIEF EX Apply 1 patch topically daily as needed (pain).   Iron 28 MG Tabs Take 28 mg by mouth daily.   labetalol 300 MG tablet Commonly known as:  NORMODYNE Take 1 tablet (300 mg total) by mouth 3 (three) times daily. What changed:  when to  take this   lisinopril-hydrochlorothiazide 20-12.5 MG tablet Commonly known as:  PRINZIDE,ZESTORETIC Take 1 tablet by mouth daily.   multivitamin with minerals Tabs tablet Take 1 tablet by mouth daily.   oxyCODONE-acetaminophen 5-325 MG tablet Commonly known as:  PERCOCET/ROXICET Take 1 tablet by mouth every 6 (six) hours as needed for severe pain.   oxymetazoline 0.05 % nasal spray Commonly known as:  AFRIN Place 1 spray into both nostrils 2 (two) times daily as needed for congestion.   predniSONE 50 MG tablet Commonly known as:  DELTASONE One P.O. Daily for 10 days   simvastatin 40 MG tablet Commonly known as:  ZOCOR Take 40 mg by mouth daily.   traZODone 50 MG tablet Commonly known as:  DESYREL Take 1  tablet by mouth at bedtime as needed.   VITAMIN C PO Take 1 tablet by mouth daily.   vitamin E 200 UNIT capsule Take 200 Units by mouth every evening.            Durable Medical Equipment  (From admission, onward)        Start     Ordered   01/23/18 1246  For home use only DME Cane  Once    Comments:  Quad cane   01/23/18 1245     Allergies  Allergen Reactions  . Lactose Intolerance (Gi) Nausea And Vomiting  . Aspirin Hives  . Other [facility]-Home Follow up.   Specialty:  Home Health Services Contact information: [address] [place] [address] [phone]            The results of significant diagnostics from this hospitalization (including imaging, microbiology, ancillary and laboratory) are listed below for reference.    Significant Diagnostic Studies: Dg Chest 2 View  Result Date: 01/22/2018 CLINICAL DATA:  Increased BP, Headache, dizziness onset last night, worse this morning when she woke. Pt reports that she has lack of feeling on right side since she got in ambulance. History of HTN, Stroke, renal disorder, bronchitis. EXAM: CHEST - 2 VIEW COMPARISON:  06/19/2016 FINDINGS: Heart is UPPER limits normal. There are no focal consolidations or pleural effusions. No pulmonary edema. IMPRESSION: No active cardiopulmonary disease. Electronically Signed   By: Nolon Nations M.D.   On: 01/22/2018 17:37   Mr Brain Wo Contrast  Result Date: 01/22/2018 CLINICAL DATA:  Hypertensive and headache. Gait instability and dizziness since last night. History of hypertension, hyperlipidemia, stroke. EXAM: MRI HEAD WITHOUT CONTRAST MRA HEAD WITHOUT CONTRAST TECHNIQUE: Multiplanar, multiecho pulse sequences of the brain and surrounding structures were obtained without intravenous contrast. Angiographic images of the head were obtained using MRA technique without contrast. COMPARISON:  CT HEAD January 22, 2018 FINDINGS: MRI HEAD FINDINGS INTRACRANIAL CONTENTS: No reduced diffusion to suggest acute ischemia. Susceptibility artifact RIGHT basal ganglia/external half capsule corresponding to old infarct. Old LEFT basal ganglia lacunar infarct. No parenchymal brain volume loss for age. No midline shift, mass effect or masses. No abnormal extra-axial fluid collections. Patchy to confluent supratentorial white matter FLAIR T2 hyperintensities including bilateral anterior temporal poles and medial frontal lobes. Old LEFT cerebellar infarct. VASCULAR: Normal major intracranial vascular flow voids present at skull base. SKULL AND UPPER CERVICAL SPINE: No abnormal sellar expansion. No suspicious calvarial bone marrow signal. Craniocervical junction maintained. C3-4 small broad-based disc bulge. SINUSES/ORBITS: The mastoid air-cells and included paranasal sinuses are well-aerated.The included ocular globes and orbital contents are non-suspicious.  OTHER: None. MRA HEAD FINDINGS ANTERIOR CIRCULATION: Normal flow related enhancement of the included cervical, petrous, cavernous and supraclinoid internal carotid arteries. Patent anterior communicating artery. Patent anterior and middle cerebral arteries. Mild luminal irregularity compatible with atherosclerosis with superimposed moderate stenoses bilateral MCA and ACA. No large vessel occlusion, flow limiting stenosis, aneurysm. POSTERIOR CIRCULATION: RIGHT vertebral artery is dominant. Vertebrobasilar arteries are patent, with normal flow related enhancement of the main branch vessels. Patent posterior cerebral arteries, mild luminal irregularity with superimposed multifocal moderate stenosis. Bilateral posterior communicating arteries present. No large vessel occlusion, flow limiting stenosis,  aneurysm. ANATOMIC VARIANTS: Hypoplastic RIGHT P1 segment. Source images and MIP images were reviewed. IMPRESSION: MRI HEAD: 1. No acute intracranial process. 2. Advanced white matter  changes for age could reflect chronic small vessel ischemic disease though given distribution, CADASIL is possible. 3. 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 Old LEFT basal ganglion lacunar infarct. Old LEFT cerebellar infarct. MRA HEAD: 1. No emergent large vessel occlusion or flow-limiting stenosis. 2. Intracranial atherosclerosis with multifocal moderate cerebral artery stenoses. Electronically Signed   By: Elon Alas M.D.   On: 01/22/2018 17:33   Mr Cervical Spine Wo Contrast  Result Date: 01/23/2018 CLINICAL DATA:  Demyelinating disease with spinal cord symptoms. EXAM: MRI CERVICAL SPINE WITHOUT CONTRAST TECHNIQUE: Multiplanar, multisequence MR imaging of the cervical spine was performed. No intravenous contrast was administered. COMPARISON:  None. FINDINGS: Alignment: Mild retrolisthesis C3-4. Vertebrae: Normal bone marrow.  Negative for fracture or mass Cord: Normal spinal cord.  No cord lesions identified. Posterior Fossa, vertebral arteries, paraspinal tissues: Negative Disc levels: C2-3: Negative C3-4: Central disc protrusion with cord flattening and mild spinal stenosis. Neural foramina patent bilaterally C4-5: Small central disc protrusion with mild spinal stenosis. Neural foramina patent bilaterally. C5-6: Disc degeneration and mild uncinate spurring diffusely. No significant spinal or foraminal stenosis. C6-7: Diffuse uncinate spurring causing mild foraminal stenosis bilaterally. Spinal canal adequate in size C7-T1: Negative IMPRESSION: Cervical spinal cord appears normal without demyelinating disease. Cervical spine degenerative change causing mild spinal and foraminal stenosis as above. Electronically Signed   By: Franchot Gallo M.D.   On: 01/23/2018 16:05   Mr Lumbar Spine Wo Contrast  Result Date: 01/26/2018 CLINICAL DATA:  Low back pain extending into the lower extremities bilaterally. Symptoms worse over the last 2 weeks. Previous lumbar spine surgery. EXAM: MRI LUMBAR SPINE  WITHOUT CONTRAST TECHNIQUE: Multiplanar, multisequence MR imaging of the lumbar spine was performed. No intravenous contrast was administered. COMPARISON:  MRI lumbar spine 12/29/2015. FINDINGS: Segmentation: 5 non rib-bearing lumbar type vertebral bodies are present. The lowest fully formed vertebral body is L5. Alignment: Slight retrolisthesis at L2-3 is stable. AP alignment is otherwise anatomic. Vertebrae:  Fusion hardware obscures marrow signal L3-S1. Conus medullaris and cauda equina: Conus extends to the L1 level. Conus and cauda equina appear normal. Paraspinal and other soft tissues: Limited imaging the abdomen is unremarkable. There is no significant adenopathy. Paraspinous musculature is within normal limits. Disc levels: L1-2: Mild facet hypertrophy is similar the prior study. There is slight disc bulging without focal stenosis. L2-3: Adjacent level disease has significantly progressed. A broad-based disc protrusion is present. Advanced facet hypertrophy is now present. The combination results in moderate central canal stenosis with subarticular narrowing bilaterally, left greater than right. Moderate foraminal narrowing has also progressed. L3-4: Discectomy and fusion is new since the prior study. There is decompression of the central canal and foramina. No residual or recurrent stenosis is present. L4-5: Established fusion is stable. No  residual recurrent stenosis is present. L5-S1: Established fusion is stable. Left laminectomy is again noted. The central canal and foramina are patent. IMPRESSION: 1. Extension of lumbar fusion to include L3-4 with decompression of the central canal and foramina at L3-4. 2. Progressive adjacent level disease at L2-3 with now moderate central and bilateral foraminal stenosis. 3. Stable appearance of mild facet hypertrophy and slight disc bulging at L1-2 without significant interval change. Electronically Signed   By: San Morelle M.D.   On: 01/26/2018 11:06    US Carotid Bilateral (at [facility] And [facility] Only)  Result Date: 01/23/2018 CLINICAL DATA:  Bilateral hand hand toes numbness with tingling for 2 days. Dysarthria. EXAM: BILATERAL CAROTID DUPLEX ULTRASOUND TECHNIQUE: Pearline Cables scale imaging, color Doppler and duplex ultrasound were performed of bilateral carotid and vertebral arteries in the neck. COMPARISON:  None. FINDINGS: Criteria: Quantification of carotid stenosis is based on velocity parameters that correlate the residual internal carotid diameter with NASCET-based stenosis levels, using the diameter of the distal internal carotid lumen as the denominator for stenosis measurement. The following velocity measurements were obtained: RIGHT ICA:  149 cm/sec CCA:  962 cm/sec SYSTOLIC ICA/CCA RATIO:  0.9 DIASTOLIC ICA/CCA RATIO: ECA:  105 cm/sec LEFT ICA:  111 cm/sec CCA:  836 cm/sec SYSTOLIC ICA/CCA RATIO:  0.6 DIASTOLIC ICA/CCA RATIO: ECA:  139 cm/sec RIGHT CAROTID ARTERY: Moderate smooth soft plaque in the bulb. Low resistance internal carotid Doppler pattern is preserved. RIGHT VERTEBRAL ARTERY:  Antegrade. LEFT CAROTID ARTERY: Mild smooth soft plaque in the bulb. Low resistance internal carotid Doppler pattern is preserved. LEFT VERTEBRAL ARTERY:  Antegrade. IMPRESSION: 50-69% stenosis in the right internal carotid artery. Less than 50% stenosis in the left internal carotid artery. Electronically Signed   By: Marybelle Killings M.D.   On: 01/23/2018 11:56   Mr Jodene Nam Head/brain OQ Cm  Result Date: 01/22/2018 CLINICAL DATA:  Hypertensive and headache. Gait instability and dizziness since last night. History of hypertension, hyperlipidemia, stroke. EXAM: MRI HEAD WITHOUT CONTRAST MRA HEAD WITHOUT CONTRAST TECHNIQUE: Multiplanar, multiecho pulse sequences of the brain and surrounding structures were obtained without intravenous contrast. Angiographic images of the head were obtained using MRA technique without contrast. COMPARISON:  CT HEAD January 22, 2018 FINDINGS: MRI HEAD  FINDINGS INTRACRANIAL CONTENTS: No reduced diffusion to suggest acute ischemia. Susceptibility artifact RIGHT basal ganglia/external half capsule corresponding to old infarct. Old LEFT basal ganglia lacunar infarct. No parenchymal brain volume loss for age. No midline shift, mass effect or masses. No abnormal extra-axial fluid collections. Patchy to confluent supratentorial white matter FLAIR T2 hyperintensities including bilateral anterior temporal poles and medial frontal lobes. Old LEFT cerebellar infarct. VASCULAR: Normal major intracranial vascular flow voids present at skull base. SKULL AND UPPER CERVICAL SPINE: No abnormal sellar expansion. No suspicious calvarial bone marrow signal. Craniocervical junction maintained. C3-4 small broad-based disc bulge. SINUSES/ORBITS: The mastoid air-cells and included paranasal sinuses are well-aerated.The included ocular globes and orbital contents are non-suspicious. OTHER: None. MRA HEAD FINDINGS ANTERIOR CIRCULATION: Normal flow related enhancement of the included cervical, petrous, cavernous and supraclinoid internal carotid arteries. Patent anterior communicating artery. Patent anterior and middle cerebral arteries. Mild luminal irregularity compatible with atherosclerosis with superimposed moderate stenoses bilateral MCA and ACA. No large vessel occlusion, flow limiting stenosis, aneurysm. POSTERIOR CIRCULATION: RIGHT vertebral artery is dominant. Vertebrobasilar arteries are patent, with normal flow related enhancement of the main branch vessels. Patent posterior cerebral arteries, mild luminal irregularity with superimposed multifocal moderate stenosis. Bilateral posterior communicating arteries present. No large vessel  occlusion, flow limiting stenosis,  aneurysm. ANATOMIC VARIANTS: Hypoplastic RIGHT P1 segment. Source images and MIP images were reviewed. IMPRESSION: MRI HEAD: 1. No acute intracranial process. 2. Advanced white matter changes for age could reflect  chronic small vessel ischemic disease though given distribution, CADASIL is possible. 3. 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 Old LEFT basal ganglion lacunar infarct. Old LEFT cerebellar infarct. MRA HEAD: 1. No emergent large vessel occlusion or flow-limiting stenosis. 2. Intracranial atherosclerosis with multifocal moderate cerebral artery stenoses. Electronically Signed   By: Elon Alas M.D.   On: 01/22/2018 17:33   Ct Head Code Stroke Wo Contrast`  Result Date: 01/22/2018 CLINICAL DATA:  Code stroke. Right-sided weakness beginning at 10 a.m. today EXAM: CT HEAD WITHOUT CONTRAST TECHNIQUE: Contiguous axial images were obtained from the base of the skull through the vertex without intravenous contrast. COMPARISON:  06/19/2016 FINDINGS: Brain: No evidence of acute infarction, hemorrhage, hydrocephalus, extra-axial collection or mass lesion/mass effect. Patchy low-density in the cerebral white matter with similar pattern to before, nonspecific but likely chronic small vessel ischemia given patient's vascular risk factors. Small remote left cerebellar infarct. Cleft like low-density in the right external capsule, interval but chronic appearing. Vascular: No hyperdense vessel.  Atherosclerotic calcification. Skull: Negative Sinuses/Orbits: Negative ASPECTS ([place] Stroke Program Early CT Score) - Ganglionic level infarction (caudate, lentiform nuclei, internal capsule, insula, M1-M3 cortex): 7 (the left putamen is indistinct, but symmetrically so stable, likely from dilated perivascular spaces or chronic ischemic injury) - Supraganglionic infarction (M4-M6 cortex): 3 Total score (0-10 with 10 being normal): 10 IMPRESSION: 1. No acute finding when compared to prior. 2. Chronic small vessel ischemia. Remote left cerebellar infarct. Interval but chronic appearing stroke in the right external capsule. Electronically Signed   By: Monte Fantasia M.D.   On: 01/22/2018 10:50    Microbiology: No  results found for this or any previous visit (from the past 240 hour(s)).   Labs: Basic Metabolic Panel: Recent Labs  Lab 01/25/18 0619 01/26/18 0403 01/27/18 0443 01/28/18 0428 01/29/18 0617  NA 140 140 139 137 136  K 3.8 3.9 4.4 4.4 4.7  CL 107 108 110 106 105  CO2 24 22 22 23 22   GLUCOSE 97 79 138* 143* 134*  BUN 43* 36* 38* 36* 47*  CREATININE 2.58* 2.15* 1.92* 1.65* 1.76*  CALCIUM 8.6* 8.6* 8.7* 8.7* 8.6*   Liver Function Tests: No results for input(s): AST, ALT, ALKPHOS, BILITOT, PROT, ALBUMIN in the last 168 hours. No results for input(s): LIPASE, AMYLASE in the last 168 hours. No results for input(s): AMMONIA in the last 168 hours. CBC: No results for input(s): WBC, NEUTROABS, HGB, HCT, MCV, PLT in the last 168 hours. Cardiac Enzymes: Recent Labs  Lab 01/22/18 1738 01/22/18 2224 01/23/18 0411  TROPONINI 0.04* 0.05* 0.05*   BNP: BNP (last 3 results) No results for input(s): BNP in the last 8760 hours.  ProBNP (last 3 results) No results for input(s): PROBNP in the last 8760 hours.  CBG: No results for input(s): GLUCAP in the last 168 hours.     Signed:  Maricela Curet   Pager: [phone] 01/29/2018, 12:52 PM

## 2018-01-29 NOTE — Care Management Note (Signed)
Case Management Note  Patient Details  Name: RAVENNA LEGORE MRN: [id number] Date of Birth: November 15, 1955   Expected Discharge Date:  01/29/18               Expected Discharge Plan:  [place]  In-House Referral:     Discharge planning Services  CM Consult  Post Acute Care Choice:  Home Health, Durable Medical Equipment Choice offered to:  Patient  DME Arranged:  Kasandra Knudsen DME Agency:  [facility] Arranged:  PT [facility] Agency:  [facility]  Status of Service:  Completed, signed off  If discussed at [place] of Stay Meetings, dates discussed:    Additional Comments: DC home today with [facility] services. Pt aware they have 48 hrs to make first visit. Juliann Pulse, [facility] rep, aware of DC today.  Sherald Barge, RN 01/29/2018, 1:49 PM

## 2018-03-05 ENCOUNTER — Emergency Department (HOSPITAL_COMMUNITY)
Admission: EM | Admit: 2018-03-05 | Discharge: 2018-03-05 | Disposition: A | Discharge status: Home / Self Care | Payer: Medicare Other | Attending: Emergency Medicine | Admitting: Emergency Medicine

## 2018-03-05 ENCOUNTER — Emergency Department (HOSPITAL_COMMUNITY): Payer: Medicare Other

## 2018-03-05 ENCOUNTER — Encounter (HOSPITAL_COMMUNITY): Payer: Self-pay | Admitting: Emergency Medicine

## 2018-03-05 ENCOUNTER — Other Ambulatory Visit: Payer: Self-pay

## 2018-03-05 DIAGNOSIS — Z96651 Presence of right artificial knee joint: Secondary | ICD-10-CM | POA: Insufficient documentation

## 2018-03-05 DIAGNOSIS — Z87891 Personal history of nicotine dependence: Secondary | ICD-10-CM | POA: Diagnosis not present

## 2018-03-05 DIAGNOSIS — F329 Major depressive disorder, single episode, unspecified: Secondary | ICD-10-CM | POA: Diagnosis not present

## 2018-03-05 DIAGNOSIS — Z8673 Personal history of transient ischemic attack (TIA), and cerebral infarction without residual deficits: Secondary | ICD-10-CM | POA: Diagnosis not present

## 2018-03-05 DIAGNOSIS — Z9049 Acquired absence of other specified parts of digestive tract: Secondary | ICD-10-CM | POA: Insufficient documentation

## 2018-03-05 DIAGNOSIS — I10 Essential (primary) hypertension: Secondary | ICD-10-CM | POA: Insufficient documentation

## 2018-03-05 DIAGNOSIS — Z7902 Long term (current) use of antithrombotics/antiplatelets: Secondary | ICD-10-CM | POA: Insufficient documentation

## 2018-03-05 DIAGNOSIS — M19012 Primary osteoarthritis, left shoulder: Secondary | ICD-10-CM | POA: Insufficient documentation

## 2018-03-05 DIAGNOSIS — Z79899 Other long term (current) drug therapy: Secondary | ICD-10-CM | POA: Diagnosis not present

## 2018-03-05 DIAGNOSIS — M4802 Spinal stenosis, cervical region: Secondary | ICD-10-CM

## 2018-03-05 DIAGNOSIS — M25512 Pain in left shoulder: Secondary | ICD-10-CM | POA: Diagnosis present

## 2018-03-05 MED ORDER — DEXAMETHASONE 4 MG PO TABS: 4.0000 mg | ORAL_TABLET | Freq: Two times a day (BID) | ORAL | 0 refills | Status: DC

## 2018-03-05 MED ORDER — HYDROMORPHONE HCL 1 MG/ML IJ SOLN
1.0000 mg | Freq: Once | INTRAMUSCULAR | Status: AC
  Administered 2018-03-05: 1 mg via INTRAMUSCULAR
  Filled 2018-03-05: qty 1

## 2018-03-05 MED ORDER — ONDANSETRON HCL 4 MG PO TABS
4.0000 mg | ORAL_TABLET | Freq: Once | ORAL | Status: AC
  Administered 2018-03-05: 4 mg via ORAL
  Filled 2018-03-05: qty 1

## 2018-03-05 MED ORDER — TRAMADOL HCL 50 MG PO TABS: 50.0000 mg | ORAL_TABLET | Freq: Four times a day (QID) | ORAL | 0 refills | Status: DC | PRN

## 2018-03-05 MED ORDER — HYDROMORPHONE HCL 1 MG/ML IJ SOLN: 1.0000 mg | Freq: Once | INTRAMUSCULAR | Status: DC

## 2018-03-05 MED ORDER — CYCLOBENZAPRINE HCL 10 MG PO TABS: 10.0000 mg | ORAL_TABLET | Freq: Three times a day (TID) | ORAL | 0 refills | Status: DC

## 2018-03-05 NOTE — ED Triage Notes (Signed)
Patient complaining of left shoulder pain since 7/17. States she felt a "pulling" while trying to move her husband who had just had surgery.

## 2018-03-05 NOTE — Discharge Instructions (Signed)
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  The CT scan of your cervical spine shows some narrowing in an area of your cervical spine called foraminal stenosis.  This may have something to do with the sensitivity to pain and touch of your upper shoulder.  Please use a heating pad to the area.  Please use Flexeril 3 times daily. This medication may cause drowsiness. Please do not drink, drive, or participate in activity that requires concentration while taking this medication.  Use Decadron 2 times daily with a meal.  Use Ultram for  severe pain not improved by Tylenol extra strength.  Please call Dr.Xu for office appointmented as soon as possible.
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
## 2018-03-18 ENCOUNTER — Ambulatory Visit (INDEPENDENT_AMBULATORY_CARE_PROVIDER_SITE_OTHER): Payer: Self-pay | Admitting: Orthopaedic Surgery

## 2018-03-24 ENCOUNTER — Ambulatory Visit (INDEPENDENT_AMBULATORY_CARE_PROVIDER_SITE_OTHER): Payer: Self-pay | Admitting: Orthopaedic Surgery

## 2018-03-25 ENCOUNTER — Encounter (INDEPENDENT_AMBULATORY_CARE_PROVIDER_SITE_OTHER): Payer: Self-pay | Admitting: Orthopaedic Surgery

## 2018-03-25 ENCOUNTER — Ambulatory Visit (INDEPENDENT_AMBULATORY_CARE_PROVIDER_SITE_OTHER): Payer: Medicare Other | Admitting: Orthopaedic Surgery

## 2018-03-25 VITALS — Ht 67.0 in | Wt 200.0 lb

## 2018-03-25 DIAGNOSIS — M25512 Pain in left shoulder: Secondary | ICD-10-CM | POA: Diagnosis not present

## 2018-03-25 MED ORDER — BUPIVACAINE HCL 0.5 % IJ SOLN
3.0000 mL | INTRAMUSCULAR | Status: AC | PRN
  Administered 2018-03-25: 3 mL via INTRA_ARTICULAR

## 2018-03-25 MED ORDER — METHYLPREDNISOLONE ACETATE 40 MG/ML IJ SUSP
40.0000 mg | INTRAMUSCULAR | Status: AC | PRN
  Administered 2018-03-25: 40 mg via INTRA_ARTICULAR

## 2018-03-25 MED ORDER — LIDOCAINE HCL 1 % IJ SOLN
3.0000 mL | INTRAMUSCULAR | Status: AC | PRN
  Administered 2018-03-25: 3 mL
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
## 2018-04-08 ENCOUNTER — Ambulatory Visit (INDEPENDENT_AMBULATORY_CARE_PROVIDER_SITE_OTHER): Payer: Medicare Other | Admitting: Orthopaedic Surgery

## 2018-04-09 ENCOUNTER — Telehealth (INDEPENDENT_AMBULATORY_CARE_PROVIDER_SITE_OTHER): Payer: Self-pay | Admitting: Orthopaedic Surgery

## 2018-04-09 NOTE — Telephone Encounter (Signed)
Do we just fax over appt card to them ? idk what to do?

## 2018-04-09 NOTE — Telephone Encounter (Signed)
Patient called needed the office to confirm transportation pick up. Patient will need appointment verification faxed over. Patient stated she spoke with someone else in the office and provided fax number.   Per Patient and Transportation service  Appt cannot be reschedule or changed

## 2018-04-09 NOTE — Telephone Encounter (Signed)
[facility] fax # [phone]

## 2018-04-10 ENCOUNTER — Ambulatory Visit (INDEPENDENT_AMBULATORY_CARE_PROVIDER_SITE_OTHER): Payer: Medicare Other | Admitting: Orthopaedic Surgery

## 2018-04-10 NOTE — Telephone Encounter (Signed)
Done

## 2018-04-10 NOTE — Telephone Encounter (Signed)
Can one of yall show me how to do this? Appt is scheduled 04/14/18.

## 2018-04-10 NOTE — Telephone Encounter (Signed)
Front desk can show you how to print appointment verification and then you can fax that to them.

## 2018-04-13 ENCOUNTER — Telehealth (INDEPENDENT_AMBULATORY_CARE_PROVIDER_SITE_OTHER): Payer: Self-pay | Admitting: Orthopaedic Surgery

## 2018-04-13 NOTE — Telephone Encounter (Signed)
I tried to fax appointment reminder to [facility], fax was busy tried multiple times. I did call [facility] to check information after trying to fax for the fourth time. [facility] stated patient is responsible for calling and scheduling appoitment pickups, the only appointment's that do require fax is eye appointments. Number called to verify information [phone].

## 2018-04-14 ENCOUNTER — Ambulatory Visit (INDEPENDENT_AMBULATORY_CARE_PROVIDER_SITE_OTHER): Payer: Medicare Other | Admitting: Orthopaedic Surgery

## 2018-04-21 ENCOUNTER — Ambulatory Visit (INDEPENDENT_AMBULATORY_CARE_PROVIDER_SITE_OTHER): Payer: Medicare Other | Admitting: Orthopaedic Surgery

## 2018-04-21 ENCOUNTER — Encounter (INDEPENDENT_AMBULATORY_CARE_PROVIDER_SITE_OTHER): Payer: Self-pay | Admitting: Orthopaedic Surgery

## 2018-04-21 DIAGNOSIS — M25512 Pain in left shoulder: Secondary | ICD-10-CM | POA: Diagnosis not present

## 2018-04-21 DIAGNOSIS — G8929 Other chronic pain: Secondary | ICD-10-CM
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
## 2018-04-24 ENCOUNTER — Encounter (INDEPENDENT_AMBULATORY_CARE_PROVIDER_SITE_OTHER): Payer: Self-pay | Admitting: *Deleted

## 2018-05-12 ENCOUNTER — Other Ambulatory Visit: Payer: Medicare Other

## 2018-05-19 ENCOUNTER — Ambulatory Visit (INDEPENDENT_AMBULATORY_CARE_PROVIDER_SITE_OTHER): Payer: Medicare Other | Admitting: Orthopaedic Surgery

## 2018-05-26 ENCOUNTER — Other Ambulatory Visit: Payer: Medicare Other

## 2018-06-04 ENCOUNTER — Other Ambulatory Visit: Payer: Medicare Other

## 2018-06-08 ENCOUNTER — Ambulatory Visit
Admission: RE | Admit: 2018-06-08 | Discharge: 2018-06-08 | Disposition: A | Discharge status: Home / Self Care | Payer: Medicare Other | Source: Ambulatory Visit | Attending: Orthopaedic Surgery | Admitting: Orthopaedic Surgery

## 2018-06-08 DIAGNOSIS — M25512 Pain in left shoulder: Principal | ICD-10-CM

## 2018-06-08 DIAGNOSIS — G8929 Other chronic pain: Secondary | ICD-10-CM

## 2018-06-08 NOTE — Progress Notes (Signed)
She needs f/u appt please.  Thanks.

## 2018-06-16 ENCOUNTER — Ambulatory Visit (INDEPENDENT_AMBULATORY_CARE_PROVIDER_SITE_OTHER): Payer: Medicare Other | Admitting: Orthopaedic Surgery

## 2018-06-16 DIAGNOSIS — M75102 Unspecified rotator cuff tear or rupture of left shoulder, not specified as traumatic: Secondary | ICD-10-CM | POA: Insufficient documentation

## 2018-06-16 DIAGNOSIS — M19012 Primary osteoarthritis, left shoulder: Secondary | ICD-10-CM

## 2018-06-16 DIAGNOSIS — M75100 Unspecified rotator cuff tear or rupture of unspecified shoulder, not specified as traumatic: Secondary | ICD-10-CM | POA: Diagnosis not present

## 2018-06-16 MED ORDER — ACETAMINOPHEN-CODEINE #3 300-30 MG PO TABS: 1.0000 | ORAL_TABLET | Freq: Every day | ORAL | 0 refills | Status: AC | PRN

## 2018-06-16 MED ORDER — TRAMADOL HCL 50 MG PO TABS: 50.0000 mg | ORAL_TABLET | Freq: Three times a day (TID) | ORAL | 2 refills | Status: DC | PRN
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
## 2018-07-09 ENCOUNTER — Encounter: Payer: Self-pay | Admitting: Orthopaedic Surgery

## 2018-07-09 ENCOUNTER — Encounter (INDEPENDENT_AMBULATORY_CARE_PROVIDER_SITE_OTHER): Payer: Self-pay | Admitting: Orthopaedic Surgery

## 2018-07-09 DIAGNOSIS — M24112 Other articular cartilage disorders, left shoulder: Secondary | ICD-10-CM | POA: Insufficient documentation

## 2018-07-09 DIAGNOSIS — M7542 Impingement syndrome of left shoulder: Secondary | ICD-10-CM | POA: Insufficient documentation

## 2018-07-09 DIAGNOSIS — M75112 Incomplete rotator cuff tear or rupture of left shoulder, not specified as traumatic: Secondary | ICD-10-CM

## 2018-07-09 DIAGNOSIS — M19012 Primary osteoarthritis, left shoulder: Secondary | ICD-10-CM

## 2018-07-15 ENCOUNTER — Telehealth (INDEPENDENT_AMBULATORY_CARE_PROVIDER_SITE_OTHER): Payer: Self-pay | Admitting: Orthopaedic Surgery

## 2018-07-15 NOTE — Telephone Encounter (Signed)
Patient called asked if Dr Erlinda Hong would call her concerning  The surgery on her left shoulder. Patient said she has a couple of questions that were not on her discharge paper. Patient said her husband may not have understood some of the things Dr Erlinda Hong told him and would like to clarify. The number to contact patient is [phone]

## 2018-07-15 NOTE — Telephone Encounter (Signed)
See message below °

## 2018-07-23 ENCOUNTER — Ambulatory Visit (INDEPENDENT_AMBULATORY_CARE_PROVIDER_SITE_OTHER): Payer: Medicare Other | Admitting: Orthopaedic Surgery

## 2018-07-23 ENCOUNTER — Telehealth (INDEPENDENT_AMBULATORY_CARE_PROVIDER_SITE_OTHER): Payer: Self-pay | Admitting: Orthopaedic Surgery

## 2018-07-23 DIAGNOSIS — M75100 Unspecified rotator cuff tear or rupture of unspecified shoulder, not specified as traumatic: Principal | ICD-10-CM

## 2018-07-23 DIAGNOSIS — M75102 Unspecified rotator cuff tear or rupture of left shoulder, not specified as traumatic: Secondary | ICD-10-CM

## 2018-07-23 DIAGNOSIS — M19012 Primary osteoarthritis, left shoulder: Secondary | ICD-10-CM

## 2018-07-23 MED ORDER — OXYCODONE-ACETAMINOPHEN 5-325 MG PO TABS: 1.0000 | ORAL_TABLET | Freq: Two times a day (BID) | ORAL | 0 refills | Status: DC | PRN

## 2018-07-23 MED ORDER — METHOCARBAMOL 500 MG PO TABS: 500.0000 mg | ORAL_TABLET | Freq: Four times a day (QID) | ORAL | 2 refills | Status: DC | PRN
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
## 2018-07-23 NOTE — Telephone Encounter (Signed)
Wanda-(OT) from [facility] called needing an order for (OT) faxed    Attn: Rehab The fax# is [phone]   The phone # is [phone]

## 2018-07-24 NOTE — Telephone Encounter (Signed)
S/p right shoulder scope and RCR repair.  Large Rotator cuff protocol

## 2018-07-24 NOTE — Telephone Encounter (Signed)
What exactly would like for me to fax to them?

## 2018-07-27 NOTE — Telephone Encounter (Signed)
Order entered into Epic and faxed.

## 2018-08-13 ENCOUNTER — Telehealth (INDEPENDENT_AMBULATORY_CARE_PROVIDER_SITE_OTHER): Payer: Self-pay

## 2018-08-13 ENCOUNTER — Other Ambulatory Visit: Payer: Self-pay

## 2018-08-13 ENCOUNTER — Ambulatory Visit (HOSPITAL_COMMUNITY): Payer: Medicare Other | Attending: Neurology | Admitting: Occupational Therapy

## 2018-08-13 ENCOUNTER — Encounter (HOSPITAL_COMMUNITY): Payer: Self-pay | Admitting: Occupational Therapy

## 2018-08-13 DIAGNOSIS — M25512 Pain in left shoulder: Secondary | ICD-10-CM | POA: Insufficient documentation

## 2018-08-13 DIAGNOSIS — M25612 Stiffness of left shoulder, not elsewhere classified: Secondary | ICD-10-CM

## 2018-08-13 DIAGNOSIS — R29898 Other symptoms and signs involving the musculoskeletal system: Secondary | ICD-10-CM | POA: Diagnosis present

## 2018-08-13 NOTE — Telephone Encounter (Signed)
Please advise 

## 2018-08-13 NOTE — Patient Instructions (Signed)
SHOULDER: Flexion On Table   Place hands on table, elbows straight. Move hips away from body. Press hands down into table.  _15__ reps per set, __3_ sets per day  Abduction (Passive)   With arm out to side, resting on table, keeping trunk away from table push body to the side.  Repeat _15___ times. Do __3__ sessions per day.  Copyright  VHI. All rights reserved.     Internal Rotation (Assistive)   Seated with elbow bent at right angle and held against side, slide arm on table surface in an inward arc. Repeat __15__ times. Do __3__ sessions per day. Activity: Use this motion to brush crumbs off the table.  Copyright  VHI. All rights reserved.    COMPLETE PENDULUM EXERCISES FOR 30 SECONDS TO A MINUTE EACH, 3-5 TIMES PER DAY. ROM: Pendulum (Side-to-Side)   Bend forward 90 at waist, using table for support. Rock body side to side to swing arm. Repeat __20__ times. Do __3__ sessions per day.  http://orth.exer.us/792   Copyright  VHI. All rights reserved.  Pendulum Forward/Back   Bend forward 90 at waist, using table for support. Rock body forward and back to swing arm. Repeat __20__ times. Do __3__ sessions per day.  Copyright  VHI. All rights reserved.  Pendulum Circular   Bend forward 90 at waist, leaning on table for support. Rock body in a circular pattern to move arm clockwise __20__ times then counterclockwise __20__ times. Do __3__ sessions per day.  Copyright  VHI. All rights reserved.  AROM: Wrist Extension   With right palm down, bend wrist up. Repeat 10____ times per set. Do ___1_ sets per session. Do __3__ sessions per day.  Copyright  VHI. All rights reserved.   AROM: Wrist Flexion   With right palm up, bend wrist up. Repeat ___10_ times per set. Do _1___ sets per session. Do __3__ sessions per day.  Copyright  VHI. All rights reserved.   AROM: Forearm Pronation / Supination   With right arm in handshake position, slowly rotate palm  down until stretch is felt. Relax. Then rotate palm up until stretch is felt. Repeat __15__ times per set. Do ___1_ sets per session. Do __3__ sessions per day.      With left hand palm up, gently bend elbow as far as possible. Then straighten arm as far as possible. Repeat __15__ times per set. Do _1___ sets per session. Do _3___ sessions per day.  Copyright  VHI. All rights reserved.

## 2018-08-13 NOTE — Telephone Encounter (Signed)
Magda Paganini with [facility].called stating that she needs a Rotator Cuff Repair Protocol faxed to [phone].  CB# is [phone].  Please advise.  Thank you.

## 2018-08-13 NOTE — Telephone Encounter (Signed)
Please fax them murphy wainer's protocol.  Thanks.
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
## 2018-08-14 NOTE — Telephone Encounter (Signed)
Do you have their protocol?

## 2018-08-17 NOTE — Telephone Encounter (Signed)
I don't have the post-op surgery protocol.  If you log into srs, search "murphy wainer" under patient name.  Then go to physical therapy and look for acromioplasty protocol.  This is what she would need

## 2018-08-18 ENCOUNTER — Telehealth (HOSPITAL_COMMUNITY): Payer: Self-pay

## 2018-08-18 ENCOUNTER — Ambulatory Visit (HOSPITAL_COMMUNITY): Payer: Medicare Other

## 2018-08-18 NOTE — Telephone Encounter (Signed)
Attempted to call patient regarding no show. Number range several times then a message stated that the call could not be completed and to try again later. Unable to reach patient. Unable to remind patient of her next scheduled appointment.   Ailene Ravel, OTR/L,CBIS  [phone]

## 2018-08-18 NOTE — Telephone Encounter (Signed)
Can you please pull and fax? Thanks.

## 2018-08-19 NOTE — Telephone Encounter (Signed)
I could not find that in [place], can you get [name] to show you? We Joyce [name] not have same access as she did over there.

## 2018-08-20 ENCOUNTER — Ambulatory Visit (HOSPITAL_COMMUNITY)
Admission: RE | Admit: 2018-08-20 | Discharge: 2018-08-20 | Disposition: A | Discharge status: Home / Self Care | Payer: Medicare Other | Source: Ambulatory Visit | Attending: Family Medicine | Admitting: Family Medicine

## 2018-08-20 ENCOUNTER — Encounter (HOSPITAL_COMMUNITY): Payer: Self-pay

## 2018-08-20 ENCOUNTER — Other Ambulatory Visit (HOSPITAL_COMMUNITY): Payer: Self-pay | Admitting: Family Medicine

## 2018-08-20 ENCOUNTER — Ambulatory Visit (INDEPENDENT_AMBULATORY_CARE_PROVIDER_SITE_OTHER): Payer: Medicare Other | Admitting: Orthopaedic Surgery

## 2018-08-20 ENCOUNTER — Ambulatory Visit (HOSPITAL_COMMUNITY): Payer: Medicare Other

## 2018-08-20 DIAGNOSIS — M25562 Pain in left knee: Secondary | ICD-10-CM | POA: Diagnosis present

## 2018-08-20 DIAGNOSIS — M25561 Pain in right knee: Secondary | ICD-10-CM

## 2018-08-20 DIAGNOSIS — M25512 Pain in left shoulder: Secondary | ICD-10-CM | POA: Diagnosis not present

## 2018-08-20 DIAGNOSIS — R29898 Other symptoms and signs involving the musculoskeletal system: Secondary | ICD-10-CM

## 2018-08-20 DIAGNOSIS — M25612 Stiffness of left shoulder, not elsewhere classified: Secondary | ICD-10-CM

## 2018-08-20 NOTE — Telephone Encounter (Signed)
Please show me tomorrow if you can. Thank you.
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
## 2018-08-21 NOTE — Telephone Encounter (Signed)
Unable to get from our end here at [facility].  Working on getting it sent from [facility]

## 2018-08-21 NOTE — Telephone Encounter (Signed)
PENDING FAX

## 2018-08-21 NOTE — Telephone Encounter (Signed)
FAXED PROTOCOL

## 2018-08-25 ENCOUNTER — Encounter

## 2018-08-27 ENCOUNTER — Telehealth (HOSPITAL_COMMUNITY): Payer: Self-pay | Admitting: Family Medicine

## 2018-08-27 ENCOUNTER — Ambulatory Visit (HOSPITAL_COMMUNITY): Payer: Medicare Other | Admitting: Occupational Therapy

## 2018-08-27 NOTE — Telephone Encounter (Signed)
08/27/18  pt left a message to cx said she wasn't feeling well

## 2018-09-01 ENCOUNTER — Encounter (HOSPITAL_COMMUNITY): Payer: Medicare Other | Admitting: Occupational Therapy

## 2018-09-03 ENCOUNTER — Encounter (HOSPITAL_COMMUNITY): Payer: Self-pay | Admitting: Occupational Therapy

## 2018-09-03 ENCOUNTER — Ambulatory Visit (HOSPITAL_COMMUNITY): Payer: Medicare Other | Admitting: Occupational Therapy

## 2018-09-03 DIAGNOSIS — M25612 Stiffness of left shoulder, not elsewhere classified: Secondary | ICD-10-CM

## 2018-09-03 DIAGNOSIS — R29898 Other symptoms and signs involving the musculoskeletal system: Secondary | ICD-10-CM

## 2018-09-03 DIAGNOSIS — M25512 Pain in left shoulder: Secondary | ICD-10-CM
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
## 2018-09-03 NOTE — Patient Instructions (Signed)

## 2018-09-08 ENCOUNTER — Ambulatory Visit (HOSPITAL_COMMUNITY): Payer: Medicare Other | Attending: Neurology | Admitting: Occupational Therapy

## 2018-09-08 ENCOUNTER — Encounter (HOSPITAL_COMMUNITY): Payer: Self-pay | Admitting: Occupational Therapy

## 2018-09-08 DIAGNOSIS — M25512 Pain in left shoulder: Secondary | ICD-10-CM | POA: Diagnosis not present

## 2018-09-08 DIAGNOSIS — R29898 Other symptoms and signs involving the musculoskeletal system: Secondary | ICD-10-CM

## 2018-09-08 DIAGNOSIS — M25612 Stiffness of left shoulder, not elsewhere classified: Secondary | ICD-10-CM
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
## 2018-09-10 ENCOUNTER — Ambulatory Visit (HOSPITAL_COMMUNITY): Payer: Medicare Other | Admitting: Specialist

## 2018-09-10 ENCOUNTER — Telehealth (HOSPITAL_COMMUNITY): Payer: Self-pay | Admitting: Specialist

## 2018-09-10 NOTE — Telephone Encounter (Signed)
pt called to cancel the appt due to the weather.

## 2018-09-15 ENCOUNTER — Ambulatory Visit (HOSPITAL_COMMUNITY): Payer: Medicare Other | Admitting: Occupational Therapy

## 2018-09-15 ENCOUNTER — Encounter (HOSPITAL_COMMUNITY): Payer: Self-pay | Admitting: Occupational Therapy

## 2018-09-15 DIAGNOSIS — M25512 Pain in left shoulder: Secondary | ICD-10-CM

## 2018-09-15 DIAGNOSIS — R29898 Other symptoms and signs involving the musculoskeletal system: Secondary | ICD-10-CM

## 2018-09-15 DIAGNOSIS — M25612 Stiffness of left shoulder, not elsewhere classified: Secondary | ICD-10-CM
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
## 2018-09-17 ENCOUNTER — Encounter (HOSPITAL_COMMUNITY): Payer: Self-pay | Admitting: Occupational Therapy

## 2018-09-17 ENCOUNTER — Encounter (INDEPENDENT_AMBULATORY_CARE_PROVIDER_SITE_OTHER): Payer: Self-pay | Admitting: Orthopaedic Surgery

## 2018-09-17 ENCOUNTER — Ambulatory Visit (HOSPITAL_COMMUNITY): Payer: Medicare Other | Admitting: Occupational Therapy

## 2018-09-17 ENCOUNTER — Ambulatory Visit (INDEPENDENT_AMBULATORY_CARE_PROVIDER_SITE_OTHER): Payer: Medicare Other | Admitting: Physician Assistant

## 2018-09-17 DIAGNOSIS — M25612 Stiffness of left shoulder, not elsewhere classified: Secondary | ICD-10-CM

## 2018-09-17 DIAGNOSIS — M25512 Pain in left shoulder: Secondary | ICD-10-CM

## 2018-09-17 DIAGNOSIS — Z9889 Other specified postprocedural states: Secondary | ICD-10-CM

## 2018-09-17 DIAGNOSIS — R29898 Other symptoms and signs involving the musculoskeletal system: Secondary | ICD-10-CM

## 2018-09-17 MED ORDER — METHOCARBAMOL 500 MG PO TABS: 500.0000 mg | ORAL_TABLET | Freq: Every evening | ORAL | 0 refills | Status: DC | PRN

## 2018-09-17 MED ORDER — HYDROCODONE-ACETAMINOPHEN 5-325 MG PO TABS: 1.0000 | ORAL_TABLET | Freq: Two times a day (BID) | ORAL | 0 refills | Status: DC | PRN
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
## 2018-09-22 ENCOUNTER — Encounter (HOSPITAL_COMMUNITY): Payer: Self-pay | Admitting: Occupational Therapy

## 2018-09-22 ENCOUNTER — Ambulatory Visit (HOSPITAL_COMMUNITY): Payer: Medicare Other | Admitting: Occupational Therapy

## 2018-09-22 DIAGNOSIS — M25512 Pain in left shoulder: Secondary | ICD-10-CM

## 2018-09-22 DIAGNOSIS — M25612 Stiffness of left shoulder, not elsewhere classified: Secondary | ICD-10-CM

## 2018-09-22 DIAGNOSIS — R29898 Other symptoms and signs involving the musculoskeletal system: Secondary | ICD-10-CM
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
## 2018-09-24 ENCOUNTER — Ambulatory Visit (HOSPITAL_COMMUNITY): Payer: Medicare Other | Admitting: Occupational Therapy

## 2018-09-24 ENCOUNTER — Telehealth (HOSPITAL_COMMUNITY): Payer: Self-pay | Admitting: Occupational Therapy

## 2018-09-24 ENCOUNTER — Telehealth (HOSPITAL_COMMUNITY): Payer: Self-pay | Admitting: Family Medicine

## 2018-09-24 NOTE — Telephone Encounter (Signed)
[facility] has another apptment that they have to attend, called pt back to confrim.

## 2018-09-24 NOTE — Telephone Encounter (Signed)
09/24/18  Patient left a message to [facility] said that they had a conflicting appt today at the same time

## 2018-09-29 ENCOUNTER — Ambulatory Visit (HOSPITAL_COMMUNITY): Payer: Medicare Other | Admitting: Occupational Therapy

## 2018-09-29 ENCOUNTER — Encounter (HOSPITAL_COMMUNITY): Payer: Self-pay | Admitting: Occupational Therapy

## 2018-09-29 DIAGNOSIS — M25512 Pain in left shoulder: Secondary | ICD-10-CM | POA: Diagnosis not present

## 2018-09-29 DIAGNOSIS — M25612 Stiffness of left shoulder, not elsewhere classified: Secondary | ICD-10-CM

## 2018-09-29 DIAGNOSIS — R29898 Other symptoms and signs involving the musculoskeletal system: Secondary | ICD-10-CM
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
## 2018-10-01 ENCOUNTER — Other Ambulatory Visit (INDEPENDENT_AMBULATORY_CARE_PROVIDER_SITE_OTHER): Payer: Self-pay | Admitting: Physician Assistant

## 2018-10-01 ENCOUNTER — Ambulatory Visit (HOSPITAL_COMMUNITY): Payer: Medicare Other | Admitting: Occupational Therapy

## 2018-10-01 ENCOUNTER — Telehealth (INDEPENDENT_AMBULATORY_CARE_PROVIDER_SITE_OTHER): Payer: Self-pay

## 2018-10-01 ENCOUNTER — Encounter (HOSPITAL_COMMUNITY): Payer: Self-pay | Admitting: Occupational Therapy

## 2018-10-01 DIAGNOSIS — M25512 Pain in left shoulder: Secondary | ICD-10-CM

## 2018-10-01 DIAGNOSIS — R29898 Other symptoms and signs involving the musculoskeletal system: Secondary | ICD-10-CM

## 2018-10-01 DIAGNOSIS — M25612 Stiffness of left shoulder, not elsewhere classified: Secondary | ICD-10-CM

## 2018-10-01 MED ORDER — HYDROCODONE-ACETAMINOPHEN 5-325 MG PO TABS: 1.0000 | ORAL_TABLET | Freq: Two times a day (BID) | ORAL | 0 refills | Status: DC | PRN

## 2018-10-01 NOTE — Therapy (Signed)
[place], [name], [address] Phone: [phone]   Fax:  [phone]  Patient Details  Name: Joyce [name] MRN: [id number] Date of Birth: 09/07/1955 Referring Provider:  Lucia Gaskins, MD  Encounter Date: 10/01/2018   Cancellation: Pt arrived with husband who has PT appt at the same time. Pt's husband experiencing increased pain, after discussing with PT pt and her husband decided to go to the ED. No treatment provided today.    Guadelupe Sabin, OTR/L  [phone] 10/01/2018, 1:59 PM  [facility] [address] [place], [name], [address] Phone: [phone]   Fax:  [phone]

## 2018-10-01 NOTE — Telephone Encounter (Signed)
See message. Please send in medication to pharm.

## 2018-10-01 NOTE — Telephone Encounter (Signed)
Patient called to request a refill on her Percocet 5/325 mg to [facility] in [place]. She can be reached at [phone].

## 2018-10-01 NOTE — Telephone Encounter (Signed)
Requesting a refill on her Percocet 5-325 mg to [facility]. If any questions, she can be reached at [phone].

## 2018-10-01 NOTE — Telephone Encounter (Signed)
Can write for norco which I just called in

## 2018-10-06 ENCOUNTER — Ambulatory Visit (HOSPITAL_COMMUNITY): Payer: Medicare Other | Admitting: Occupational Therapy

## 2018-10-06 ENCOUNTER — Telehealth (HOSPITAL_COMMUNITY): Payer: Self-pay | Admitting: Family Medicine

## 2018-10-06 NOTE — Telephone Encounter (Signed)
10/06/18  she left a message to cx said she has a stomach virus and not feeling well

## 2018-10-08 ENCOUNTER — Ambulatory Visit (HOSPITAL_COMMUNITY): Payer: Medicare Other | Admitting: Occupational Therapy

## 2018-10-08 ENCOUNTER — Telehealth (HOSPITAL_COMMUNITY): Payer: Self-pay | Admitting: Family Medicine

## 2018-10-08 NOTE — Telephone Encounter (Signed)
10/08/18  pt called and cx said she wasn't feeling any better today

## 2018-10-13 ENCOUNTER — Encounter (INDEPENDENT_AMBULATORY_CARE_PROVIDER_SITE_OTHER): Payer: Self-pay | Admitting: Orthopaedic Surgery

## 2018-10-13 ENCOUNTER — Ambulatory Visit (INDEPENDENT_AMBULATORY_CARE_PROVIDER_SITE_OTHER): Payer: Medicare Other | Admitting: Orthopaedic Surgery

## 2018-10-13 ENCOUNTER — Other Ambulatory Visit (INDEPENDENT_AMBULATORY_CARE_PROVIDER_SITE_OTHER): Payer: Self-pay

## 2018-10-13 DIAGNOSIS — Z9889 Other specified postprocedural states: Secondary | ICD-10-CM
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
## 2018-10-16 ENCOUNTER — Encounter (HOSPITAL_COMMUNITY): Payer: Self-pay | Admitting: Occupational Therapy

## 2018-10-16 ENCOUNTER — Ambulatory Visit (HOSPITAL_COMMUNITY): Payer: Medicare Other | Attending: Neurology | Admitting: Occupational Therapy

## 2018-10-16 ENCOUNTER — Other Ambulatory Visit: Payer: Self-pay

## 2018-10-16 DIAGNOSIS — R29898 Other symptoms and signs involving the musculoskeletal system: Secondary | ICD-10-CM | POA: Diagnosis present

## 2018-10-16 DIAGNOSIS — M25612 Stiffness of left shoulder, not elsewhere classified: Secondary | ICD-10-CM | POA: Diagnosis present

## 2018-10-16 DIAGNOSIS — M25512 Pain in left shoulder: Secondary | ICD-10-CM
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
## 2018-10-16 NOTE — Patient Instructions (Signed)
  1) Flexion Wall Stretch    Face wall, place affected handon wall in front of you. Slide hand up the wall  and lean body in towards the wall. Hold for 10 seconds. Repeat 3-5 times. 1-2 times/day.     2) Towel Stretch with Internal Rotation   Or     Gently pull up (or to the side) your affected arm  behind your back with the assist of a towel. Hold 10 seconds, repeat 3-5 times. 1-2 times/day.             3) Corner Stretch    Stand at a corner of a wall, place your arms on the walls with elbows bent. Lean into the corner until a stretch is felt along the front of your chest and/or shoulders. Hold for 10 seconds. Repeat 3-5X, 1-2 times/day.    4) Posterior Capsule Stretch    Bring the involved arm across chest. Grasp elbow and pull toward chest until you feel a stretch in the back of the upper arm and shoulder. Hold 10 seconds. Repeat 3-5X. Complete 1-2 times/day.

## 2018-10-21 ENCOUNTER — Other Ambulatory Visit: Payer: Self-pay

## 2018-10-21 ENCOUNTER — Encounter (HOSPITAL_COMMUNITY): Payer: Self-pay | Admitting: Occupational Therapy

## 2018-10-21 ENCOUNTER — Ambulatory Visit (HOSPITAL_COMMUNITY): Payer: Medicare Other | Admitting: Occupational Therapy

## 2018-10-21 DIAGNOSIS — M25512 Pain in left shoulder: Secondary | ICD-10-CM

## 2018-10-21 DIAGNOSIS — M25612 Stiffness of left shoulder, not elsewhere classified: Secondary | ICD-10-CM

## 2018-10-21 DIAGNOSIS — R29898 Other symptoms and signs involving the musculoskeletal system: Secondary | ICD-10-CM
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
## 2018-10-23 ENCOUNTER — Ambulatory Visit (HOSPITAL_COMMUNITY): Payer: Medicare Other

## 2018-10-28 ENCOUNTER — Ambulatory Visit (HOSPITAL_COMMUNITY): Payer: Medicare Other | Admitting: Occupational Therapy
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
## 2018-10-30 ENCOUNTER — Ambulatory Visit (HOSPITAL_COMMUNITY): Payer: Medicare Other | Admitting: Occupational Therapy

## 2018-11-03 ENCOUNTER — Encounter (HOSPITAL_COMMUNITY): Payer: Medicare Other | Admitting: Occupational Therapy

## 2018-11-05 ENCOUNTER — Encounter (HOSPITAL_COMMUNITY): Payer: Medicare Other

## 2018-11-05 ENCOUNTER — Telehealth (HOSPITAL_COMMUNITY): Payer: Self-pay | Admitting: Occupational Therapy

## 2018-11-05 NOTE — Telephone Encounter (Signed)
Patient was contacted today regarding the temporary reduction of OP rehab services due to concerns for community transmission of Covid-19.  Therapist advised the patient to continue to perform their HEP and assured they had no unanswered questions at this time.   The patient expressed interest in being contacted for an e-visit, virtual check-in, or telehealth visit to continue their POC care, when those services become available.  Outpatient Rehabilitation Services will follow up with patients at that time.   Guadelupe Sabin, OTR/L  [phone] 11/05/2018

## 2018-11-10 ENCOUNTER — Encounter (HOSPITAL_COMMUNITY): Payer: Medicare Other | Admitting: Occupational Therapy

## 2018-11-12 ENCOUNTER — Encounter (HOSPITAL_COMMUNITY): Payer: Medicare Other | Admitting: Occupational Therapy

## 2018-11-16 ENCOUNTER — Telehealth (HOSPITAL_COMMUNITY): Payer: Self-pay | Admitting: Occupational Therapy

## 2018-11-16 NOTE — Telephone Encounter (Signed)
Left message to get patient scheduled for Video visit as soon as possible. NF 11/16/2018

## 2018-11-18 ENCOUNTER — Telehealth (HOSPITAL_COMMUNITY): Payer: Self-pay | Admitting: Occupational Therapy

## 2018-11-18 NOTE — Telephone Encounter (Signed)
Telehealth visit [facility] 1/1-12/31/2020 No patient responsibility (0 Due) No copay No Co Ins No OOP and No visit limit No Pre-cert req [employer]ific Rev Coded needed 780 Modifier needed 95 S/w [place] 11/13/18 @ 10:am Ref#[id number] MM/NF Sent My chart invite to pt for visit on 11/19/2018. NF 11/18/2018

## 2018-11-18 NOTE — Telephone Encounter (Signed)
Called to schedule no answer

## 2018-11-18 NOTE — Telephone Encounter (Signed)
[email or web address] .Rehabcovidscheduled for 11/30/2018

## 2018-11-19 ENCOUNTER — Ambulatory Visit (HOSPITAL_COMMUNITY): Payer: Medicare Other | Admitting: Occupational Therapy

## 2018-11-19 ENCOUNTER — Telehealth (HOSPITAL_COMMUNITY): Payer: Self-pay | Admitting: Occupational Therapy

## 2018-11-19 ENCOUNTER — Other Ambulatory Visit: Payer: Self-pay

## 2018-11-19 NOTE — Telephone Encounter (Signed)
Joyce [name] was contacted today regarding transition if in-person OP Rehab Services to telehealth due to Covid-19. Pt consented to telehealth services, educated on MyChart signup, [employer], and was agreeable to receive information via (text/email) regarding telehealth services. Pt consented and was scheduled for appointment.

## 2018-11-19 NOTE — Telephone Encounter (Signed)
Telehealth internet went down and I cx her apptment for today and told patient  we will send another [employer] for [facility]

## 2018-11-23 ENCOUNTER — Encounter (HOSPITAL_COMMUNITY): Payer: Self-pay

## 2018-11-23 ENCOUNTER — Ambulatory Visit (HOSPITAL_COMMUNITY): Payer: Medicare Other | Attending: Neurology

## 2018-11-23 ENCOUNTER — Other Ambulatory Visit: Payer: Self-pay

## 2018-11-23 DIAGNOSIS — M25512 Pain in left shoulder: Secondary | ICD-10-CM | POA: Diagnosis present

## 2018-11-23 DIAGNOSIS — M25612 Stiffness of left shoulder, not elsewhere classified: Secondary | ICD-10-CM | POA: Diagnosis present

## 2018-11-23 DIAGNOSIS — R29898 Other symptoms and signs involving the musculoskeletal system: Secondary | ICD-10-CM | POA: Diagnosis present
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
## 2018-11-23 NOTE — Patient Instructions (Signed)
Complete the following stretches 2-3 times a day.  Doorway Stretch  Place each hand opposite each other on the doorway. (You can change where you feel the stretch by moving arms higher or lower.) Step through with one foot and bend front knee until a stretch is felt and hold. Step through with the opposite foot on the next rep. Hold for __15-20___ seconds. Repeat __2__times.      Internal Rotation Across Back  Grab the end of a towel with your affected side, palm facing backwards. Grab the towel with your unaffected side and pull your affected hand across your back until you feel a stretch in the front of your shoulder. If you feel pain, pull just to the pain, do not pull through the pain. Hold. Return your affected arm to your side. Try to keep your hand/arm close to your body during the entire movement.     Hold for 15-20 seconds. Complete 2 times.       OR     Posterior Capsule Stretch   Wall Flexion  Slide your arm up the wall or door frame until a stretch is felt in your shoulder . Hold for 15-20 seconds. Complete 2 times        Use your orange band for the following exercises. Progress to your green band if the orange one does not provide enough of a challenge.  Strengthening: Chest Pull - Resisted   Hold Theraband in front of body with hands about shoulder width a part. Pull band a part and back together slowly. Repeat _10-12___ times. Complete __1__ set(s) per session.. Repeat _1-2___ session(s) per day.  http://orth.exer.us/926   Copyright  VHI. All rights reserved.   PNF Strengthening: Resisted   Standing with resistive band around each hand, bring right arm up and away, thumb back. Repeat _10-12___ times per set. Do ___1_ sets per session. Do __1-2__ sessions per day.      Resisted External Rotation: in Neutral - Bilateral   Sit or stand, tubing in both hands, elbows at sides, bent to 90, forearms forward. Pinch shoulder blades together and rotate  forearms out. Keep elbows at sides. Repeat _10-12___ times per set. Do ___1_ sets per session. Do _1-2___ sessions per day.  http://orth.exer.us/966   Copyright  VHI. All rights reserved.   PNF Strengthening: Resisted   Standing, hold resistive band above head. Bring right arm down and out from side. Repeat _10-12___ times per set. Do __1__ sets per session. Do _1-2___ sessions per day.  http://orth.exer.us/922   Copyright  VHI. All rights reserved.

## 2018-11-24 ENCOUNTER — Encounter (INDEPENDENT_AMBULATORY_CARE_PROVIDER_SITE_OTHER): Payer: Self-pay | Admitting: Orthopaedic Surgery

## 2018-11-24 ENCOUNTER — Ambulatory Visit (INDEPENDENT_AMBULATORY_CARE_PROVIDER_SITE_OTHER): Payer: Medicare Other | Admitting: Orthopaedic Surgery

## 2018-11-24 DIAGNOSIS — Z9889 Other specified postprocedural states: Secondary | ICD-10-CM

## 2018-11-24 DIAGNOSIS — M75100 Unspecified rotator cuff tear or rupture of unspecified shoulder, not specified as traumatic: Principal | ICD-10-CM

## 2018-11-24 DIAGNOSIS — M75102 Unspecified rotator cuff tear or rupture of left shoulder, not specified as traumatic: Secondary | ICD-10-CM

## 2018-11-24 MED ORDER — LIDOCAINE HCL 2 % IJ SOLN
2.0000 mL | INTRAMUSCULAR | Status: AC | PRN
  Administered 2018-11-24: 2 mL

## 2018-11-24 MED ORDER — BUPIVACAINE HCL 0.25 % IJ SOLN
2.0000 mL | INTRAMUSCULAR | Status: AC | PRN
  Administered 2018-11-24: 2 mL via INTRA_ARTICULAR

## 2018-11-24 MED ORDER — KETOROLAC TROMETHAMINE 30 MG/ML IJ SOLN: 30.0000 mg | Freq: Once | INTRAMUSCULAR | Status: AC
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
## 2018-11-26 ENCOUNTER — Encounter (HOSPITAL_COMMUNITY): Payer: Self-pay | Admitting: Occupational Therapy

## 2018-11-26 ENCOUNTER — Ambulatory Visit (HOSPITAL_COMMUNITY): Payer: Medicare Other | Admitting: Occupational Therapy

## 2018-11-26 ENCOUNTER — Other Ambulatory Visit: Payer: Self-pay

## 2018-11-26 DIAGNOSIS — M25512 Pain in left shoulder: Secondary | ICD-10-CM | POA: Diagnosis not present

## 2018-11-26 DIAGNOSIS — R29898 Other symptoms and signs involving the musculoskeletal system: Secondary | ICD-10-CM

## 2018-11-26 DIAGNOSIS — M25612 Stiffness of left shoulder, not elsewhere classified: Secondary | ICD-10-CM
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
## 2018-12-07 ENCOUNTER — Ambulatory Visit (HOSPITAL_COMMUNITY): Payer: Medicare Other

## 2018-12-07 ENCOUNTER — Telehealth (HOSPITAL_COMMUNITY): Payer: Self-pay | Admitting: Family Medicine

## 2018-12-07 NOTE — Telephone Encounter (Signed)
12/07/18  pt called in at 2:33 to say that she wouldn't be doing Telehealth today, she isn't feeling well and her husband just had surgery last week.  Before patient called in I had called her at 2;18 and 2:30 and there was no answer

## 2018-12-10 ENCOUNTER — Ambulatory Visit (HOSPITAL_COMMUNITY): Payer: Medicare Other | Attending: Neurology | Admitting: Occupational Therapy

## 2018-12-10 DIAGNOSIS — M25612 Stiffness of left shoulder, not elsewhere classified: Secondary | ICD-10-CM | POA: Insufficient documentation

## 2018-12-10 DIAGNOSIS — M25512 Pain in left shoulder: Secondary | ICD-10-CM | POA: Insufficient documentation

## 2018-12-10 DIAGNOSIS — R29898 Other symptoms and signs involving the musculoskeletal system: Secondary | ICD-10-CM | POA: Insufficient documentation

## 2018-12-14 ENCOUNTER — Ambulatory Visit (HOSPITAL_COMMUNITY): Payer: Medicare Other

## 2018-12-14 ENCOUNTER — Other Ambulatory Visit: Payer: Self-pay

## 2018-12-14 ENCOUNTER — Encounter (HOSPITAL_COMMUNITY): Payer: Self-pay

## 2018-12-14 DIAGNOSIS — M25512 Pain in left shoulder: Secondary | ICD-10-CM

## 2018-12-14 DIAGNOSIS — M25612 Stiffness of left shoulder, not elsewhere classified: Secondary | ICD-10-CM

## 2018-12-14 DIAGNOSIS — R29898 Other symptoms and signs involving the musculoskeletal system: Secondary | ICD-10-CM
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
## 2018-12-17 ENCOUNTER — Telehealth (HOSPITAL_COMMUNITY): Payer: Self-pay | Admitting: Family Medicine

## 2018-12-17 ENCOUNTER — Ambulatory Visit (HOSPITAL_COMMUNITY): Payer: Medicare Other | Admitting: Occupational Therapy

## 2018-12-17 NOTE — Telephone Encounter (Signed)
12/17/18  Tried for 30 minutes to get her on the computer and finally she called back to say that her computer had froze and so we cancelled.

## 2018-12-21 ENCOUNTER — Ambulatory Visit (HOSPITAL_COMMUNITY): Payer: Medicare Other

## 2018-12-21 ENCOUNTER — Encounter (HOSPITAL_COMMUNITY): Payer: Self-pay

## 2018-12-21 ENCOUNTER — Telehealth (HOSPITAL_COMMUNITY): Payer: Self-pay | Admitting: Family Medicine

## 2018-12-21 NOTE — Telephone Encounter (Signed)
12/21/18  I called 3 times, 3:20, 3:30, 3:44 and the call went straight to voice mail each time.  Patient is a no-show for today

## 2018-12-21 NOTE — Therapy (Signed)
[place], [name], [address] Phone: [phone]   Fax:  [phone]  Patient Details  Name: Joyce [name] MRN: [id number] Date of Birth: 02/19/1956 Referring Provider:  No ref. provider found  Encounter Date: 12/21/2018  OCCUPATIONAL THERAPY DISCHARGE SUMMARY  Visits from Start of Care: 13  Current functional level related to goals / functional outcomes: OT LONG TERM GOAL #1    Title  Pt will return to highest functional level using LUE as non-dominant during ADL completion.     Time  8    Period  Weeks    Status  Met       OT LONG TERM GOAL #2   Title  Pt will decrease pain in LUE to 3/10 or less to improve ability to use LUE as assist during ADL completion.     Time  8    Period  Weeks    Status  No Met       OT LONG TERM GOAL #3   Title  Pt will decrease LUE fascial restrictions to minimal amounts or less to improve mobility required for functional reaching tasks.     Baseline  4/20: Unable to assess via telehealth. Goal has been deferred at this time.     Time  8    Period  Weeks    Status  Deferred        OT LONG TERM GOAL #4   Title  Pt will increase LUE A/ROM to [facility] to improve ability to reach overhead and behind back during dressing tasks.     Time  8    Period  Weeks        OT LONG TERM GOAL #5   Title  Pt will improve LUE strength to 4+/5 to increase ability to lift pots and pans during meal preparation tasks.     Time  8    Period  Weeks    Status  Met      Remaining deficits: Pt did not present for last OT session via Telehealth as discussed last week. Based on patient report from previous sessions patient has met all therapy goals with the exception of one related to pain. Patient continues to voice pain/discomfort in her LUE at each session. She has been educated that this pain will be present for some time. She has had slower rehab progress due to pain and may  not feel close to 100% for more than a year post surgery. She has been educated on her HEP and does not require any VC to complete. She reported at last session that she is using her LUE as much as possible. She notices the most deficits with pain and strength. Pt encouraged to complete HEP once a day if able. Pain management techniques have been reviewed. Patient's A/ROM is [facility] and strength is [facility] as well.    Education / Equipment: LUE shoulder strengthening HEP, Pain management techniques.  Plan: Patient agrees to discharge.  Patient goals were met. Patient is being discharged due to meeting the stated rehab goals.  ?????            Ailene Ravel, OTR/L,CBIS  [phone]  12/21/2018, 3:58 PM  [place] [address] [place], [name], [address] Phone: [phone]   Fax:  [phone]

## 2018-12-24 ENCOUNTER — Ambulatory Visit (HOSPITAL_COMMUNITY): Payer: Medicare Other | Admitting: Occupational Therapy

## 2018-12-31 ENCOUNTER — Ambulatory Visit (HOSPITAL_COMMUNITY): Payer: Medicare Other | Admitting: Occupational Therapy

## 2019-01-12 ENCOUNTER — Encounter: Payer: Self-pay | Admitting: Internal Medicine

## 2019-01-15 ENCOUNTER — Encounter: Payer: Self-pay | Admitting: Orthopaedic Surgery

## 2019-01-18 ENCOUNTER — Emergency Department (HOSPITAL_COMMUNITY)
Admission: EM | Admit: 2019-01-18 | Discharge: 2019-01-18 | Disposition: A | Discharge status: Home / Self Care | Payer: Medicare Other | Attending: Emergency Medicine | Admitting: Emergency Medicine

## 2019-01-18 ENCOUNTER — Other Ambulatory Visit: Payer: Self-pay

## 2019-01-18 ENCOUNTER — Encounter (HOSPITAL_COMMUNITY): Payer: Self-pay | Admitting: Adult Health

## 2019-01-18 DIAGNOSIS — N183 Chronic kidney disease, stage 3 (moderate): Secondary | ICD-10-CM | POA: Insufficient documentation

## 2019-01-18 DIAGNOSIS — Z7902 Long term (current) use of antithrombotics/antiplatelets: Secondary | ICD-10-CM | POA: Insufficient documentation

## 2019-01-18 DIAGNOSIS — M5432 Sciatica, left side: Secondary | ICD-10-CM | POA: Insufficient documentation

## 2019-01-18 DIAGNOSIS — Z79899 Other long term (current) drug therapy: Secondary | ICD-10-CM | POA: Diagnosis not present

## 2019-01-18 DIAGNOSIS — M25512 Pain in left shoulder: Secondary | ICD-10-CM | POA: Diagnosis present

## 2019-01-18 DIAGNOSIS — Z87891 Personal history of nicotine dependence: Secondary | ICD-10-CM | POA: Diagnosis not present

## 2019-01-18 DIAGNOSIS — M5430 Sciatica, unspecified side: Secondary | ICD-10-CM

## 2019-01-18 DIAGNOSIS — I129 Hypertensive chronic kidney disease with stage 1 through stage 4 chronic kidney disease, or unspecified chronic kidney disease: Secondary | ICD-10-CM | POA: Insufficient documentation

## 2019-01-18 DIAGNOSIS — Z96651 Presence of right artificial knee joint: Secondary | ICD-10-CM | POA: Insufficient documentation

## 2019-01-18 DIAGNOSIS — Z9049 Acquired absence of other specified parts of digestive tract: Secondary | ICD-10-CM | POA: Diagnosis not present

## 2019-01-18 MED ORDER — KETOROLAC TROMETHAMINE 60 MG/2ML IM SOLN
15.0000 mg | Freq: Once | INTRAMUSCULAR | Status: AC
  Administered 2019-01-18: 15 mg via INTRAMUSCULAR
  Filled 2019-01-18: qty 2

## 2019-01-18 MED ORDER — ACETAMINOPHEN 325 MG PO TABS
650.0000 mg | ORAL_TABLET | Freq: Once | ORAL | Status: AC
  Administered 2019-01-18: 650 mg via ORAL
  Filled 2019-01-18: qty 2

## 2019-01-18 MED ORDER — PREDNISONE 10 MG (21) PO TBPK: ORAL_TABLET | Freq: Every day | ORAL | 0 refills | Status: DC

## 2019-01-18 NOTE — ED Triage Notes (Addendum)
Present with left leg pain from the posterior mid thigh down to her posterior ankle. The pain began six days after being out running errands. She says the pain was so severe that she could not walk down the stairs and lifting her leg makses it worse. She also complainsof left shoulder pain that has been chronic, but now she is having trouble lifting her arm above the head. She says she been taking 3 hydrocodone and it is not helping the pain. She is used to 20mg  of oxycodone. She is using rubs and still not getting any relief. -She also endorses severe fatigue.
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
## 2019-01-18 NOTE — Discharge Instructions (Addendum)
You have been seen today for leg and shoulder pain. Please read and follow all provided instructions. Return to the emergency room for worsening condition or new concerning symptoms.    1. Medications:  Prescription sent to your pharmacy for prednisone.  This is a Dosepak.  Please take as prescribed.  It will help with inflammation which will likely help with your leg pain.  Continue usual home medications Take medications as prescribed. Please review all of the medicines and only take them if you do not have an allergy to them.  2. Treatment: rest, drink plenty of fluids 3. Follow Up: Please follow up with your primary doctor in 2-5 days for discussion of your diagnoses and further evaluation after today's visit; Call today to arrange your follow up.  -Also need to follow-up with your orthopedist about your shoulder.  Please call his office tomorrow to try to have your appointment scheduled for sooner.  It is also a possibility that you have an allergic reaction to any of the medicines that you have been prescribed - Everybody reacts differently to medications and while MOST people have no trouble with most medicines, you may have a reaction such as nausea, vomiting, rash, swelling, shortness of breath. If this is the case, please stop taking the medicine immediately and contact your physician.  ?

## 2019-01-26 ENCOUNTER — Other Ambulatory Visit: Payer: Self-pay

## 2019-01-26 ENCOUNTER — Encounter: Payer: Self-pay | Admitting: Orthopaedic Surgery

## 2019-01-26 ENCOUNTER — Ambulatory Visit (INDEPENDENT_AMBULATORY_CARE_PROVIDER_SITE_OTHER): Payer: Medicare Other | Admitting: Orthopaedic Surgery

## 2019-01-26 DIAGNOSIS — M25512 Pain in left shoulder: Secondary | ICD-10-CM | POA: Diagnosis not present

## 2019-01-26 DIAGNOSIS — Z9889 Other specified postprocedural states: Secondary | ICD-10-CM

## 2019-01-26 MED ORDER — BUPIVACAINE HCL 0.25 % IJ SOLN
2.0000 mL | INTRAMUSCULAR | Status: AC | PRN
  Administered 2019-01-26: 2 mL via INTRA_ARTICULAR

## 2019-01-26 MED ORDER — LIDOCAINE HCL 2 % IJ SOLN
2.0000 mL | INTRAMUSCULAR | Status: AC | PRN
  Administered 2019-01-26: 2 mL

## 2019-01-26 MED ORDER — METHYLPREDNISOLONE ACETATE 40 MG/ML IJ SUSP
40.0000 mg | INTRAMUSCULAR | Status: AC | PRN
  Administered 2019-01-26: 40 mg via INTRA_ARTICULAR
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
## 2019-02-08 ENCOUNTER — Emergency Department (HOSPITAL_COMMUNITY): Payer: Medicare Other

## 2019-02-08 ENCOUNTER — Encounter (HOSPITAL_COMMUNITY): Payer: Self-pay | Admitting: Emergency Medicine

## 2019-02-08 ENCOUNTER — Inpatient Hospital Stay (HOSPITAL_COMMUNITY)
Admission: EM | Admit: 2019-02-08 | Discharge: 2019-02-17 | DRG: 065 | Disposition: A | Discharge status: Skilled Nursing Facility | Payer: Medicare Other | Attending: Internal Medicine | Admitting: Internal Medicine

## 2019-02-08 ENCOUNTER — Other Ambulatory Visit: Payer: Self-pay

## 2019-02-08 DIAGNOSIS — R402362 Coma scale, best motor response, obeys commands, at arrival to emergency department: Secondary | ICD-10-CM | POA: Diagnosis present

## 2019-02-08 DIAGNOSIS — I129 Hypertensive chronic kidney disease with stage 1 through stage 4 chronic kidney disease, or unspecified chronic kidney disease: Secondary | ICD-10-CM | POA: Diagnosis present

## 2019-02-08 DIAGNOSIS — Z981 Arthrodesis status: Secondary | ICD-10-CM

## 2019-02-08 DIAGNOSIS — Z886 Allergy status to analgesic agent status: Secondary | ICD-10-CM

## 2019-02-08 DIAGNOSIS — I639 Cerebral infarction, unspecified: Secondary | ICD-10-CM

## 2019-02-08 DIAGNOSIS — M5441 Lumbago with sciatica, right side: Secondary | ICD-10-CM

## 2019-02-08 DIAGNOSIS — Z8673 Personal history of transient ischemic attack (TIA), and cerebral infarction without residual deficits: Secondary | ICD-10-CM

## 2019-02-08 DIAGNOSIS — G35 Multiple sclerosis: Secondary | ICD-10-CM

## 2019-02-08 DIAGNOSIS — E785 Hyperlipidemia, unspecified: Secondary | ICD-10-CM | POA: Diagnosis present

## 2019-02-08 DIAGNOSIS — M4804 Spinal stenosis, thoracic region: Secondary | ICD-10-CM | POA: Diagnosis present

## 2019-02-08 DIAGNOSIS — D631 Anemia in chronic kidney disease: Secondary | ICD-10-CM | POA: Diagnosis present

## 2019-02-08 DIAGNOSIS — Z8349 Family history of other endocrine, nutritional and metabolic diseases: Secondary | ICD-10-CM

## 2019-02-08 DIAGNOSIS — G629 Polyneuropathy, unspecified: Secondary | ICD-10-CM | POA: Diagnosis present

## 2019-02-08 DIAGNOSIS — Z8249 Family history of ischemic heart disease and other diseases of the circulatory system: Secondary | ICD-10-CM

## 2019-02-08 DIAGNOSIS — Z79891 Long term (current) use of opiate analgesic: Secondary | ICD-10-CM

## 2019-02-08 DIAGNOSIS — Z751 Person awaiting admission to adequate facility elsewhere: Secondary | ICD-10-CM

## 2019-02-08 DIAGNOSIS — G8191 Hemiplegia, unspecified affecting right dominant side: Secondary | ICD-10-CM | POA: Diagnosis present

## 2019-02-08 DIAGNOSIS — N183 Chronic kidney disease, stage 3 unspecified: Secondary | ICD-10-CM

## 2019-02-08 DIAGNOSIS — E875 Hyperkalemia: Secondary | ICD-10-CM | POA: Diagnosis present

## 2019-02-08 DIAGNOSIS — I6389 Other cerebral infarction: Principal | ICD-10-CM | POA: Diagnosis present

## 2019-02-08 DIAGNOSIS — Z79899 Other long term (current) drug therapy: Secondary | ICD-10-CM

## 2019-02-08 DIAGNOSIS — M6282 Rhabdomyolysis: Secondary | ICD-10-CM | POA: Diagnosis present

## 2019-02-08 DIAGNOSIS — Z1159 Encounter for screening for other viral diseases: Secondary | ICD-10-CM

## 2019-02-08 DIAGNOSIS — F1410 Cocaine abuse, uncomplicated: Secondary | ICD-10-CM

## 2019-02-08 DIAGNOSIS — E669 Obesity, unspecified: Secondary | ICD-10-CM | POA: Diagnosis present

## 2019-02-08 DIAGNOSIS — D649 Anemia, unspecified: Secondary | ICD-10-CM | POA: Diagnosis present

## 2019-02-08 DIAGNOSIS — Z9049 Acquired absence of other specified parts of digestive tract: Secondary | ICD-10-CM

## 2019-02-08 DIAGNOSIS — R402142 Coma scale, eyes open, spontaneous, at arrival to emergency department: Secondary | ICD-10-CM | POA: Diagnosis present

## 2019-02-08 DIAGNOSIS — M48061 Spinal stenosis, lumbar region without neurogenic claudication: Secondary | ICD-10-CM | POA: Diagnosis present

## 2019-02-08 DIAGNOSIS — Z6835 Body mass index (BMI) 35.0-35.9, adult: Secondary | ICD-10-CM

## 2019-02-08 DIAGNOSIS — G959 Disease of spinal cord, unspecified: Secondary | ICD-10-CM | POA: Diagnosis present

## 2019-02-08 DIAGNOSIS — Z91048 Other nonmedicinal substance allergy status: Secondary | ICD-10-CM

## 2019-02-08 DIAGNOSIS — I10 Essential (primary) hypertension: Secondary | ICD-10-CM | POA: Diagnosis present

## 2019-02-08 DIAGNOSIS — G610 Guillain-Barre syndrome: Secondary | ICD-10-CM | POA: Diagnosis present

## 2019-02-08 DIAGNOSIS — R159 Full incontinence of feces: Secondary | ICD-10-CM

## 2019-02-08 DIAGNOSIS — I63032 Cerebral infarction due to thrombosis of left carotid artery: Secondary | ICD-10-CM

## 2019-02-08 DIAGNOSIS — E739 Lactose intolerance, unspecified: Secondary | ICD-10-CM | POA: Diagnosis present

## 2019-02-08 DIAGNOSIS — N179 Acute kidney failure, unspecified: Secondary | ICD-10-CM

## 2019-02-08 DIAGNOSIS — G35D Multiple sclerosis, unspecified: Secondary | ICD-10-CM

## 2019-02-08 DIAGNOSIS — Z96651 Presence of right artificial knee joint: Secondary | ICD-10-CM | POA: Diagnosis present

## 2019-02-08 DIAGNOSIS — Z87891 Personal history of nicotine dependence: Secondary | ICD-10-CM

## 2019-02-08 DIAGNOSIS — R402252 Coma scale, best verbal response, oriented, at arrival to emergency department: Secondary | ICD-10-CM | POA: Diagnosis present

## 2019-02-08 DIAGNOSIS — Z90710 Acquired absence of both cervix and uterus: Secondary | ICD-10-CM

## 2019-02-08 DIAGNOSIS — D509 Iron deficiency anemia, unspecified: Secondary | ICD-10-CM | POA: Diagnosis present

## 2019-02-08 DIAGNOSIS — E860 Dehydration: Secondary | ICD-10-CM | POA: Diagnosis present

## 2019-02-08 DIAGNOSIS — M62838 Other muscle spasm: Secondary | ICD-10-CM | POA: Diagnosis present

## 2019-02-08 DIAGNOSIS — G8929 Other chronic pain: Secondary | ICD-10-CM | POA: Diagnosis present

## 2019-02-08 LAB — CBC WITH DIFFERENTIAL/PLATELET
Abs Immature Granulocytes: 0.04 10*3/uL (ref 0.00–0.07)
Basophils Absolute: 0 10*3/uL (ref 0.0–0.1)
Basophils Relative: 0 %
Eosinophils Absolute: 0.2 10*3/uL (ref 0.0–0.5)
Eosinophils Relative: 2 %
HCT: 33.7 % — ABNORMAL LOW (ref 36.0–46.0)
Hemoglobin: 10.6 g/dL — ABNORMAL LOW (ref 12.0–15.0)
Immature Granulocytes: 0 %
Lymphocytes Relative: 16 %
Lymphs Abs: 1.7 10*3/uL (ref 0.7–4.0)
MCH: 25.2 pg — ABNORMAL LOW (ref 26.0–34.0)
MCHC: 31.5 g/dL (ref 30.0–36.0)
MCV: 80 fL (ref 80.0–100.0)
Monocytes Absolute: 1.2 10*3/uL — ABNORMAL HIGH (ref 0.1–1.0)
Monocytes Relative: 11 %
Neutro Abs: 7.4 10*3/uL (ref 1.7–7.7)
Neutrophils Relative %: 71 %
Platelets: 313 10*3/uL (ref 150–400)
RBC: 4.21 MIL/uL (ref 3.87–5.11)
RDW: 15.9 % — ABNORMAL HIGH (ref 11.5–15.5)
WBC: 10.6 10*3/uL — ABNORMAL HIGH (ref 4.0–10.5)
nRBC: 0 % (ref 0.0–0.2)

## 2019-02-08 LAB — BASIC METABOLIC PANEL
Anion gap: 13 (ref 5–15)
BUN: 47 mg/dL — ABNORMAL HIGH (ref 8–23)
CO2: 19 mmol/L — ABNORMAL LOW (ref 22–32)
Calcium: 8.8 mg/dL — ABNORMAL LOW (ref 8.9–10.3)
Chloride: 108 mmol/L (ref 98–111)
Creatinine, Ser: 3.12 mg/dL — ABNORMAL HIGH (ref 0.44–1.00)
GFR calc Af Amer: 18 mL/min — ABNORMAL LOW (ref >60)
GFR calc non Af Amer: 15 mL/min — ABNORMAL LOW (ref >60)
Glucose, Bld: 132 mg/dL — ABNORMAL HIGH (ref 70–99)
Potassium: 3.5 mmol/L (ref 3.5–5.1)
Sodium: 140 mmol/L (ref 135–145)

## 2019-02-08 LAB — HEPATIC FUNCTION PANEL
ALT: 16 U/L (ref 0–44)
AST: 31 U/L (ref 15–41)
Albumin: 3.7 g/dL (ref 3.5–5.0)
Alkaline Phosphatase: 85 U/L (ref 38–126)
Bilirubin, Direct: <0.1 mg/dL (ref 0.0–0.2)
Total Bilirubin: 0.4 mg/dL (ref 0.3–1.2)
Total Protein: 7 g/dL (ref 6.5–8.1)

## 2019-02-08 LAB — SEDIMENTATION RATE: Sed Rate: 48 mm/hr — ABNORMAL HIGH (ref 0–22)

## 2019-02-08 LAB — SARS CORONAVIRUS 2 BY RT PCR (HOSPITAL ORDER, PERFORMED IN ~~LOC~~ HOSPITAL LAB): SARS Coronavirus 2: NEGATIVE

## 2019-02-08 LAB — CK: Total CK: 763 U/L — ABNORMAL HIGH (ref 38–234)

## 2019-02-08 MED ORDER — SODIUM CHLORIDE 0.9 % IV SOLN
INTRAVENOUS | Status: AC
  Administered 2019-02-08: 23:00:00 via INTRAVENOUS

## 2019-02-08 MED ORDER — TRAZODONE HCL 50 MG PO TABS
50.0000 mg | ORAL_TABLET | Freq: Every day | ORAL | Status: DC
  Administered 2019-02-08 – 2019-02-16 (×9): 50 mg via ORAL
  Filled 2019-02-08 (×9): qty 1

## 2019-02-08 MED ORDER — ACETAMINOPHEN 650 MG RE SUPP: 650.0000 mg | Freq: Four times a day (QID) | RECTAL | Status: DC | PRN

## 2019-02-08 MED ORDER — MORPHINE SULFATE (PF) 2 MG/ML IV SOLN
2.0000 mg | Freq: Once | INTRAVENOUS | Status: AC
  Administered 2019-02-08: 2 mg via INTRAVENOUS
  Filled 2019-02-08: qty 1

## 2019-02-08 MED ORDER — ADULT MULTIVITAMIN W/MINERALS CH
1.0000 | ORAL_TABLET | Freq: Every day | ORAL | Status: DC
  Administered 2019-02-09 – 2019-02-17 (×9): 1 via ORAL
  Filled 2019-02-08 (×9): qty 1

## 2019-02-08 MED ORDER — ACETAMINOPHEN 325 MG PO TABS
650.0000 mg | ORAL_TABLET | Freq: Four times a day (QID) | ORAL | Status: DC | PRN
  Administered 2019-02-12: 650 mg via ORAL
  Filled 2019-02-08 (×3): qty 2

## 2019-02-08 MED ORDER — LABETALOL HCL 200 MG PO TABS
300.0000 mg | ORAL_TABLET | Freq: Three times a day (TID) | ORAL | Status: DC
  Administered 2019-02-08 – 2019-02-17 (×27): 300 mg via ORAL
  Filled 2019-02-08 (×27): qty 2

## 2019-02-08 MED ORDER — LORATADINE 10 MG PO TABS
10.0000 mg | ORAL_TABLET | Freq: Every day | ORAL | Status: DC
  Administered 2019-02-09 – 2019-02-17 (×9): 10 mg via ORAL
  Filled 2019-02-08 (×9): qty 1

## 2019-02-08 MED ORDER — GABAPENTIN 100 MG PO CAPS
100.0000 mg | ORAL_CAPSULE | Freq: Three times a day (TID) | ORAL | Status: DC
  Administered 2019-02-08 – 2019-02-12 (×11): 100 mg via ORAL
  Filled 2019-02-08 (×11): qty 1

## 2019-02-08 MED ORDER — HEPARIN SODIUM (PORCINE) 5000 UNIT/ML IJ SOLN
5000.0000 [IU] | Freq: Three times a day (TID) | INTRAMUSCULAR | Status: DC
  Administered 2019-02-09 – 2019-02-11 (×6): 5000 [IU] via SUBCUTANEOUS
  Filled 2019-02-08 (×5): qty 1

## 2019-02-08 MED ORDER — OXYCODONE-ACETAMINOPHEN 5-325 MG PO TABS
1.0000 | ORAL_TABLET | Freq: Two times a day (BID) | ORAL | Status: DC | PRN
  Administered 2019-02-08 – 2019-02-09 (×2): 2 via ORAL
  Filled 2019-02-08 (×2): qty 2

## 2019-02-08 MED ORDER — SODIUM CHLORIDE 0.9 % IV BOLUS
1000.0000 mL | Freq: Once | INTRAVENOUS | Status: AC
  Administered 2019-02-08: 1000 mL via INTRAVENOUS

## 2019-02-08 MED ORDER — CLONIDINE HCL 0.2 MG/24HR TD PTWK
0.2000 mg | MEDICATED_PATCH | TRANSDERMAL | Status: DC
  Administered 2019-02-11: 0.2 mg via TRANSDERMAL
  Filled 2019-02-08 (×3): qty 1

## 2019-02-08 MED ORDER — AMLODIPINE BESYLATE 5 MG PO TABS
10.0000 mg | ORAL_TABLET | Freq: Every day | ORAL | Status: DC
  Administered 2019-02-09 – 2019-02-17 (×9): 10 mg via ORAL
  Filled 2019-02-08 (×9): qty 2

## 2019-02-08 NOTE — ED Notes (Signed)
Pt is advised a urine sample is needed. Purewick is in place.

## 2019-02-08 NOTE — ED Triage Notes (Signed)
Pt arrives by [facility]. Ems states she was seen today by PCP about her chronic right leg pain, pcp states she has muscle spasms in her leg and gave her a prescription of percocet 10-325 but was not able to pick the prescription up yet. Pt states the pain starts at her right hip and goes down to her toes. Pt states she went to [employer] and tried some cocaine for the first time last night and she would like for it to stay confidential.
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
## 2019-02-08 NOTE — ED Provider Notes (Signed)
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 2 MG/ML injection 2 mg (2 mg Intravenous Given 02/08/19 1701)     Initial Impression / Assessment and Plan / ED Course  I have reviewed the triage vital signs and the nursing notes.  Pertinent labs & imaging results that were available during my care of the patient were reviewed by me and considered in my medical decision making (see chart for details).    CK 763 CBC with leukocytosis of 10.6, suspect secondary to pain, patient without infectious-like symptoms BMP with creatinine of 3.12 this appears new within the last year, previous findings of 1.7-1.6, suspect secondary to dehydration and cocaine use Urinalysis pending  MRI thoracic: 1. No acute abnormality within the thoracic spine. No evidence for  cord compression or severe spinal stenosis.  2. Multilevel degenerative disc bulging with facet hypertrophy with   resultant mild to moderate bilateral foraminal narrowing at T5-6,  T10-11, and T11-12 as above.  3. 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   MRI Lumbar:  1. No acute abnormality within  the lumbar spine. No evidence for  cord compression.  2. Prior fusion at L3-4 through L5-S1 without residual or recurrent  stenosis.  3. 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   RLE DVT US:  IMPRESSION:  No evidence of DVT within the right lower extremity.  - Patient will need admission for her abnormal neuro exam as well as acute kidney injury.  Pain controlled in the emergency department, fluids given.  Discussed case with Dr. Maudie Mercury who will be seeing patient for admission and further work-up.  Patient reassessed resting comfortably in no acute distress she states understanding of care plan and is agreeable for admission. - Patient has been admitted to hospitalist service for further evaluation and management.  Case was discussed with Dr. Lita Mains during this visit.  Note: Portions of this report may have been transcribed using voice recognition software. Every effort was made to ensure accuracy; however, inadvertent computerized transcription errors may still be present. Final Clinical Impressions(s) / ED Diagnoses   Final diagnoses:  AKI (acute kidney injury) ([place])  Incontinence of feces, unspecified fecal incontinence type  Acute right-sided low back pain with right-sided sciatica  Cocaine abuse [facility])    ED Discharge Orders    None       Gari Crown 02/08/19 Crecencio Mc, MD 02/08/19 2250

## 2019-02-08 NOTE — H&P (Addendum)
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 folate, tsh,  Check cbc in am   DVT Prophylaxis-   heparin - SCDs    AM Labs Ordered, also please review Full Orders  Family Communication: Admission, patients condition and plan of care including tests being ordered have been discussed with the patient who indicate understanding and agree with the plan and Code Status.  Code Status: FULL CODE, spoke on phone with her family to notify them that she was being admitted.  Admission status: Observation: Based on patients clinical presentation and evaluation of above  clinical data, I have made determination that patient meets observation criteria at this time    Time spent in minutes : 70    Jani Gravel M.D on 02/08/2019 at 7:35  PM

## 2019-02-08 NOTE — ED Notes (Signed)
Asked pt about urine sample and she stated she still does not need to go at this time.

## 2019-02-09 ENCOUNTER — Encounter (HOSPITAL_COMMUNITY): Payer: Self-pay | Admitting: *Deleted

## 2019-02-09 ENCOUNTER — Observation Stay (HOSPITAL_COMMUNITY): Payer: Medicare Other

## 2019-02-09 DIAGNOSIS — D631 Anemia in chronic kidney disease: Secondary | ICD-10-CM | POA: Diagnosis present

## 2019-02-09 DIAGNOSIS — N179 Acute kidney failure, unspecified: Secondary | ICD-10-CM | POA: Diagnosis present

## 2019-02-09 DIAGNOSIS — Z8673 Personal history of transient ischemic attack (TIA), and cerebral infarction without residual deficits: Secondary | ICD-10-CM | POA: Diagnosis not present

## 2019-02-09 DIAGNOSIS — Z981 Arthrodesis status: Secondary | ICD-10-CM | POA: Diagnosis not present

## 2019-02-09 DIAGNOSIS — N183 Chronic kidney disease, stage 3 (moderate): Secondary | ICD-10-CM | POA: Diagnosis present

## 2019-02-09 DIAGNOSIS — I63032 Cerebral infarction due to thrombosis of left carotid artery: Secondary | ICD-10-CM | POA: Diagnosis not present

## 2019-02-09 DIAGNOSIS — D509 Iron deficiency anemia, unspecified: Secondary | ICD-10-CM | POA: Diagnosis present

## 2019-02-09 DIAGNOSIS — I361 Nonrheumatic tricuspid (valve) insufficiency: Secondary | ICD-10-CM | POA: Diagnosis not present

## 2019-02-09 DIAGNOSIS — G959 Disease of spinal cord, unspecified: Secondary | ICD-10-CM | POA: Diagnosis present

## 2019-02-09 DIAGNOSIS — Z1159 Encounter for screening for other viral diseases: Secondary | ICD-10-CM | POA: Diagnosis not present

## 2019-02-09 DIAGNOSIS — Z79891 Long term (current) use of opiate analgesic: Secondary | ICD-10-CM | POA: Diagnosis not present

## 2019-02-09 DIAGNOSIS — G610 Guillain-Barre syndrome: Secondary | ICD-10-CM | POA: Diagnosis present

## 2019-02-09 DIAGNOSIS — I6389 Other cerebral infarction: Secondary | ICD-10-CM | POA: Diagnosis present

## 2019-02-09 DIAGNOSIS — M4804 Spinal stenosis, thoracic region: Secondary | ICD-10-CM | POA: Diagnosis present

## 2019-02-09 DIAGNOSIS — Z96651 Presence of right artificial knee joint: Secondary | ICD-10-CM | POA: Diagnosis present

## 2019-02-09 DIAGNOSIS — E782 Mixed hyperlipidemia: Secondary | ICD-10-CM | POA: Diagnosis not present

## 2019-02-09 DIAGNOSIS — G8191 Hemiplegia, unspecified affecting right dominant side: Secondary | ICD-10-CM | POA: Diagnosis present

## 2019-02-09 DIAGNOSIS — Z8349 Family history of other endocrine, nutritional and metabolic diseases: Secondary | ICD-10-CM | POA: Diagnosis not present

## 2019-02-09 DIAGNOSIS — Z87891 Personal history of nicotine dependence: Secondary | ICD-10-CM | POA: Diagnosis not present

## 2019-02-09 DIAGNOSIS — M5441 Lumbago with sciatica, right side: Secondary | ICD-10-CM | POA: Diagnosis not present

## 2019-02-09 DIAGNOSIS — E860 Dehydration: Secondary | ICD-10-CM | POA: Diagnosis present

## 2019-02-09 DIAGNOSIS — M6282 Rhabdomyolysis: Secondary | ICD-10-CM | POA: Diagnosis present

## 2019-02-09 DIAGNOSIS — Z8249 Family history of ischemic heart disease and other diseases of the circulatory system: Secondary | ICD-10-CM | POA: Diagnosis not present

## 2019-02-09 DIAGNOSIS — I129 Hypertensive chronic kidney disease with stage 1 through stage 4 chronic kidney disease, or unspecified chronic kidney disease: Secondary | ICD-10-CM | POA: Diagnosis present

## 2019-02-09 DIAGNOSIS — F1410 Cocaine abuse, uncomplicated: Secondary | ICD-10-CM | POA: Diagnosis present

## 2019-02-09 DIAGNOSIS — E785 Hyperlipidemia, unspecified: Secondary | ICD-10-CM | POA: Diagnosis present

## 2019-02-09 DIAGNOSIS — Z9049 Acquired absence of other specified parts of digestive tract: Secondary | ICD-10-CM | POA: Diagnosis not present

## 2019-02-09 DIAGNOSIS — I10 Essential (primary) hypertension: Secondary | ICD-10-CM | POA: Diagnosis not present

## 2019-02-09 DIAGNOSIS — Z90710 Acquired absence of both cervix and uterus: Secondary | ICD-10-CM | POA: Diagnosis not present

## 2019-02-09 DIAGNOSIS — M48061 Spinal stenosis, lumbar region without neurogenic claudication: Secondary | ICD-10-CM | POA: Diagnosis present

## 2019-02-09 LAB — CK TOTAL AND CKMB (NOT AT ARMC)
CK, MB: 10.1 ng/mL — ABNORMAL HIGH (ref 0.5–5.0)
Relative Index: 2.2 (ref 0.0–2.5)
Total CK: 463 U/L — ABNORMAL HIGH (ref 38–234)

## 2019-02-09 LAB — COMPREHENSIVE METABOLIC PANEL
ALT: 14 U/L (ref 0–44)
AST: 24 U/L (ref 15–41)
Albumin: 3.1 g/dL — ABNORMAL LOW (ref 3.5–5.0)
Alkaline Phosphatase: 82 U/L (ref 38–126)
Anion gap: 9 (ref 5–15)
BUN: 44 mg/dL — ABNORMAL HIGH (ref 8–23)
CO2: 19 mmol/L — ABNORMAL LOW (ref 22–32)
Calcium: 8 mg/dL — ABNORMAL LOW (ref 8.9–10.3)
Chloride: 111 mmol/L (ref 98–111)
Creatinine, Ser: 2.75 mg/dL — ABNORMAL HIGH (ref 0.44–1.00)
GFR calc Af Amer: 20 mL/min — ABNORMAL LOW (ref >60)
GFR calc non Af Amer: 18 mL/min — ABNORMAL LOW (ref >60)
Glucose, Bld: 139 mg/dL — ABNORMAL HIGH (ref 70–99)
Potassium: 3.3 mmol/L — ABNORMAL LOW (ref 3.5–5.1)
Sodium: 139 mmol/L (ref 135–145)
Total Bilirubin: 0.2 mg/dL — ABNORMAL LOW (ref 0.3–1.2)
Total Protein: 5.7 g/dL — ABNORMAL LOW (ref 6.5–8.1)

## 2019-02-09 LAB — RAPID URINE DRUG SCREEN, HOSP PERFORMED
Amphetamines: NOT DETECTED
Barbiturates: NOT DETECTED
Benzodiazepines: NOT DETECTED
Cocaine: POSITIVE — AB
Opiates: POSITIVE — AB
Tetrahydrocannabinol: NOT DETECTED

## 2019-02-09 LAB — CBC
HCT: 30.1 % — ABNORMAL LOW (ref 36.0–46.0)
Hemoglobin: 9.3 g/dL — ABNORMAL LOW (ref 12.0–15.0)
MCH: 25.6 pg — ABNORMAL LOW (ref 26.0–34.0)
MCHC: 30.9 g/dL (ref 30.0–36.0)
MCV: 82.9 fL (ref 80.0–100.0)
Platelets: 230 10*3/uL (ref 150–400)
RBC: 3.63 MIL/uL — ABNORMAL LOW (ref 3.87–5.11)
RDW: 15.9 % — ABNORMAL HIGH (ref 11.5–15.5)
WBC: 9.3 10*3/uL (ref 4.0–10.5)
nRBC: 0 % (ref 0.0–0.2)

## 2019-02-09 LAB — IRON AND TIBC
Iron: 26 ug/dL — ABNORMAL LOW (ref 28–170)
Saturation Ratios: 6 % — ABNORMAL LOW (ref 10.4–31.8)
TIBC: 400 ug/dL (ref 250–450)
UIBC: 374 ug/dL

## 2019-02-09 LAB — TSH: TSH: 4.001 u[IU]/mL (ref 0.350–4.500)

## 2019-02-09 LAB — URINALYSIS, ROUTINE W REFLEX MICROSCOPIC
Bilirubin Urine: NEGATIVE
Glucose, UA: NEGATIVE mg/dL
Hgb urine dipstick: NEGATIVE
Ketones, ur: NEGATIVE mg/dL
Leukocytes,Ua: NEGATIVE
Nitrite: NEGATIVE
Protein, ur: NEGATIVE mg/dL
Specific Gravity, Urine: 1.013 (ref 1.005–1.030)
pH: 5 (ref 5.0–8.0)

## 2019-02-09 LAB — PROTEIN / CREATININE RATIO, URINE
Creatinine, Urine: 142.42 mg/dL
Protein Creatinine Ratio: 0.15 mg/mg{Cre} (ref 0.00–0.15)
Total Protein, Urine: 22 mg/dL

## 2019-02-09 LAB — FERRITIN: Ferritin: 35 ng/mL (ref 11–307)

## 2019-02-09 LAB — SODIUM, URINE, RANDOM: Sodium, Ur: 89 mmol/L

## 2019-02-09 LAB — VITAMIN B12: Vitamin B-12: 309 pg/mL (ref 180–914)

## 2019-02-09 MED ORDER — MUSCLE RUB 10-15 % EX CREA
TOPICAL_CREAM | CUTANEOUS | Status: DC | PRN
  Administered 2019-02-09: 10:00:00 via TOPICAL
  Filled 2019-02-09: qty 85

## 2019-02-09 MED ORDER — HYDROMORPHONE HCL 1 MG/ML IJ SOLN
0.5000 mg | INTRAMUSCULAR | Status: DC | PRN
  Administered 2019-02-09 – 2019-02-17 (×30): 0.5 mg via INTRAVENOUS
  Filled 2019-02-09 (×37): qty 0.5

## 2019-02-09 MED ORDER — MAGNESIUM CITRATE PO SOLN
1.0000 | Freq: Once | ORAL | Status: AC
  Administered 2019-02-09: 1 via ORAL
  Filled 2019-02-09: qty 296

## 2019-02-09 MED ORDER — METHOCARBAMOL 500 MG PO TABS
500.0000 mg | ORAL_TABLET | Freq: Once | ORAL | Status: AC
  Administered 2019-02-09: 06:00:00 500 mg via ORAL
  Filled 2019-02-09: qty 1

## 2019-02-09 MED ORDER — SODIUM CHLORIDE 0.9 % IV SOLN
510.0000 mg | Freq: Once | INTRAVENOUS | Status: AC
  Administered 2019-02-09: 510 mg via INTRAVENOUS
  Filled 2019-02-09: qty 510

## 2019-02-09 MED ORDER — POTASSIUM CHLORIDE CRYS ER 20 MEQ PO TBCR
40.0000 meq | EXTENDED_RELEASE_TABLET | Freq: Once | ORAL | Status: AC
  Administered 2019-02-09: 40 meq via ORAL
  Filled 2019-02-09: qty 2

## 2019-02-09 MED ORDER — METHOCARBAMOL 500 MG PO TABS
500.0000 mg | ORAL_TABLET | Freq: Four times a day (QID) | ORAL | Status: DC | PRN
  Administered 2019-02-09 – 2019-02-16 (×9): 500 mg via ORAL
  Filled 2019-02-09 (×9): qty 1

## 2019-02-09 NOTE — Progress Notes (Signed)
PROGRESS NOTE    Joyce [name]  SAY:[id number] DOB: 1956-03-21 DOA: 02/08/2019 PCP: Lucia Gaskins, MD   Brief Narrative:  Per HPI:  Joyce [name]  is a 63 y.o. female,w hypertension, hyperlipidemia, h/o stroke, ckd stage3, depression,  chronic right leg pain, apparently presents with c/o muscle spasm, pt notes being in [facility] and doing coccaine.  Pt notes that she has been taking Nsaids.  Pt denies fever, chills, rash, cough, cp, palp, sob, n/v, abd pain, diarrhea, brbpr, black stool, dysuria, hematuria.  Pt has not noticed any change in urine output.  Pt does note that she has had poor po intake recently.   Patient was admitted for AKI on CKD stage III along with some mild rhabdomyolysis secondary to cocaine use.  She continues to have ongoing, severe muscle spasms to her lower extremities with negative work-up on MRI of the thoracic and lumbar spine.  Assessment & Plan:   Principal Problem:   ARF (acute renal failure) (HCC) Active Problems:   Essential hypertension, benign   Hyperlipidemia   Anemia   ARF on CKD stage 3 Check urine sodium, urine protein, urine creatinine , urine eoinophils Check spep, upep ESR is 48 Check renal ultrasound which is pending Hydrate with ns iv at much higher rate and monitor strict I's and O's Check cmp in am  Mild Rhabdomyolysis likely secondary to admitted coccaine use and dehydration TSH 4.01 Repeat CK level pending  Hypertension STOP Lisinopril / hydrochlorothiazide temporarily Cont Amlodipine '10mg'$  po qday Cont Labetalol '300mg'$  po bid Cont Clonidine patch 0.'2mg'$   Topically q week  Hyperlipidemia STOP Simvastatin, due to cpk elevation  Chronic right lower ext pain Cont Gabapentin '300mg'$  po tid => at lower dose of '100mg'$  po tid due to ARF Continue on Robaxin as needed Dilaudid added for severe pain and cramps  Iron deficiency anemia We will give 1 dose of Feraheme and recheck CBC in a.m. No overt bleeding noted   DVT  prophylaxis: Heparin Code Status: Full Family Communication: None at bedside Disposition Plan: Continue IV fluid hydration and assess for improvement in kidney injury.  Pain management for muscle spasms.  Patient requires inpatient stay on account of aggressive IV fluid administration and treatment of AKI.   Consultants:   None  Procedures:   None  Antimicrobials:   None   Subjective: Patient seen and evaluated today with no new acute complaints or concerns. No acute concerns or events noted overnight.  Patient continues have significant spasms and pain to her lower extremities this morning.  Objective: Vitals:   02/08/19 1311 02/08/19 1312 02/08/19 2144 02/09/19 0630  BP:  102/66 (!) 193/74 (!) 129/49  Pulse:  74 (!) 52 (!) 50  Resp:  '18 18 18  '$ Temp:  98.7 F (37.1 C) 97.7 F (36.5 C) (!) 97.5 F (36.4 C)  TempSrc:  Oral Oral Oral  SpO2:  100% 100% 99%  Weight: 99.8 kg 102.5 kg 107 kg 109.1 kg  Height: '5\' 7"'$  (1.702 m) '5\' 7"'$  (1.702 m) '5\' 7"'$  (1.702 m)     Intake/Output Summary (Last 24 hours) at 02/09/2019 1127 Last data filed at 02/09/2019 0911 Gross per 24 hour  Intake 1677.69 ml  Output --  Net 1677.69 ml   Filed Weights   02/08/19 1312 02/08/19 2144 02/09/19 0630  Weight: 102.5 kg 107 kg 109.1 kg    Examination:  General exam: Appears calm and comfortable  Respiratory system: Clear to auscultation. Respiratory effort normal. Cardiovascular system: S1 & S2  heard, RRR. No JVD, murmurs, rubs, gallops or clicks. No pedal edema. Gastrointestinal system: Abdomen is nondistended, soft and nontender. No organomegaly or masses felt. Normal bowel sounds heard. Central nervous system: Alert and oriented. No focal neurological deficits. Extremities: Symmetric 5 x 5 power. Skin: No rashes, lesions or ulcers Psychiatry: Judgement and insight appear normal. Mood & affect appropriate.     Data Reviewed: I have personally reviewed following labs and imaging  studies  CBC: Recent Labs  Lab 02/08/19 1426 02/09/19 0418  WBC 10.6* 9.3  NEUTROABS 7.4  --   HGB 10.6* 9.3*  HCT 33.7* 30.1*  MCV 80.0 82.9  PLT 313 416   Basic Metabolic Panel: Recent Labs  Lab 02/08/19 1426 02/09/19 0418  NA 140 139  K 3.5 3.3*  CL 108 111  CO2 19* 19*  GLUCOSE 132* 139*  BUN 47* 44*  CREATININE 3.12* 2.75*  CALCIUM 8.8* 8.0*   GFR: Estimated Creatinine Clearance: 26.6 mL/min (A) (by C-G formula based on SCr of 2.75 mg/dL (H)). Liver Function Tests: Recent Labs  Lab 02/08/19 1426 02/09/19 0418  AST 31 24  ALT 16 14  ALKPHOS 85 82  BILITOT 0.4 0.2*  PROT 7.0 5.7*  ALBUMIN 3.7 3.1*   No results for input(s): LIPASE, AMYLASE in the last 168 hours. No results for input(s): AMMONIA in the last 168 hours. Coagulation Profile: No results for input(s): INR, PROTIME in the last 168 hours. Cardiac Enzymes: Recent Labs  Lab 02/08/19 1508  CKTOTAL 763*   BNP (last 3 results) No results for input(s): PROBNP in the last 8760 hours. HbA1C: No results for input(s): HGBA1C in the last 72 hours. CBG: No results for input(s): GLUCAP in the last 168 hours. Lipid Profile: No results for input(s): CHOL, HDL, LDLCALC, TRIG, CHOLHDL, LDLDIRECT in the last 72 hours. Thyroid Function Tests: Recent Labs    02/09/19 0418  TSH 4.001   Anemia Panel: Recent Labs    02/09/19 0418  VITAMINB12 309  FERRITIN 35  TIBC 400  IRON 26*   Sepsis Labs: No results for input(s): PROCALCITON, LATICACIDVEN in the last 168 hours.  Recent Results (from the past 240 hour(s))  SARS Coronavirus 2 (CEPHEID - Performed in [place] hospital lab), Hosp Order     Status: None   Collection Time: 02/08/19  8:35 PM   Specimen: Nasopharyngeal Swab  Result Value Ref Range Status   SARS Coronavirus 2 NEGATIVE NEGATIVE Final    Comment: (NOTE) If result is NEGATIVE SARS-CoV-2 target nucleic acids are NOT DETECTED. The SARS-CoV-2 RNA is generally detectable in upper and  lower  respiratory specimens during the acute phase of infection. The lowest  concentration of SARS-CoV-2 viral copies this assay can detect is 250  copies / mL. A negative result does not preclude SARS-CoV-2 infection  and should not be used as the sole basis for treatment or other  patient management decisions.  A negative result may occur with  improper specimen collection / handling, submission of specimen other  than nasopharyngeal swab, presence of viral mutation(s) within the  areas targeted by this assay, and inadequate number of viral copies  (<250 copies / mL). A negative result must be combined with clinical  observations, patient history, and epidemiological information. If result is POSITIVE SARS-CoV-2 target nucleic acids are DETECTED. The SARS-CoV-2 RNA is generally detectable in upper and lower  respiratory specimens dur ing the acute phase of infection.  Positive  results are indicative of active infection with SARS-CoV-2.  Clinical  correlation with patient history and other diagnostic information is  necessary to determine patient infection status.  Positive results do  not rule out bacterial infection or co-infection with other viruses. If result is PRESUMPTIVE POSTIVE SARS-CoV-2 nucleic acids MAY BE PRESENT.   A presumptive positive result was obtained on the submitted specimen  and confirmed on repeat testing.  While 2019 novel coronavirus  (SARS-CoV-2) nucleic acids may be present in the submitted sample  additional confirmatory testing may be necessary for epidemiological  and / or clinical management purposes  to differentiate between  SARS-CoV-2 and other Sarbecovirus currently known to infect humans.  If clinically indicated additional testing with an alternate test  methodology [phone]) is advised. The SARS-CoV-2 RNA is generally  detectable in upper and lower respiratory sp ecimens during the acute  phase of infection. The expected result is  Negative. Fact Sheet for Patients:  StrictlyIdeas.no Fact Sheet for Healthcare Providers: BankingDealers.co.za This test is not yet approved or cleared by the [place] FDA and has been authorized for detection and/or diagnosis of SARS-CoV-2 by FDA under an Emergency Use Authorization (EUA).  This EUA will remain in effect (meaning this test can be used) for the duration of the COVID-19 declaration under Section 564(b)(1) of the Act, 21 U.S.C. section 360bbb-3(b)(1), unless the authorization is terminated or revoked sooner. Performed at [facility], [address], [place],  [address]          Radiology Studies: Mr Thoracic Spine Wo Contrast  Result Date: 02/08/2019 CLINICAL DATA:  Initial evaluation for back pain, chronic right lower extremity pain. EXAM: MRI THORACIC AND LUMBAR SPINE WITHOUT CONTRAST TECHNIQUE: Multiplanar and multiecho pulse sequences of the thoracic and lumbar spine were obtained without intravenous contrast. COMPARISON:  Prior MRI from 12/29/2015. FINDINGS: MRI THORACIC SPINE FINDINGS Alignment: Trace dextroscoliosis. Alignment otherwise normal with preservation of the normal thoracic kyphosis. No listhesis or subluxation. Vertebrae: Vertebral body height maintained without evidence for acute or chronic fracture. Underlying bone marrow signal intensity within normal limits. Few scattered benign hemangiomas noted within the T9 and T10 vertebral bodies. 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 No other discrete or worrisome osseous lesions. No other abnormal marrow edema. Cord: Signal intensity within the thoracic spinal cord is normal. Normal cord caliber morphology. Paraspinal and other soft tissues: Unremarkable. Disc levels: T1-2:  Unremarkable. T2-3: Left-sided facet hypertrophy.  No stenosis. T3-4: Small central disc protrusion indents the ventral thecal sac. Mild facet hypertrophy. No significant  stenosis or cord deformity. T4-5:  Mild right greater than left facet hypertrophy.  No stenosis. T5-6: Minimal disc bulge. Mild right greater than left facet hypertrophy. No spinal stenosis. Mild right foraminal narrowing. T6-7: Diffuse disc bulge. Mild bilateral facet hypertrophy. Perineural cyst at the left neural foramen. No significant stenosis. T7-8: Diffuse disc bulge with superimposed small central disc protrusion indents the ventral thecal sac. Mild bilateral facet hypertrophy. Thecal sac remains patent. No significant cord deformity. Foramina remain patent. T8-9: Diffuse disc bulge with mild bilateral facet hypertrophy. No significant stenosis. T9-10: Mild diffuse disc bulge. Mild to moderate facet hypertrophy. No significant stenosis. T10-11: Diffuse disc bulge. Moderate left greater than right facet hypertrophy. Thecal sac remains patent. Mild bilateral foraminal narrowing. T11-12: Negative interspace. Moderate to advanced left greater than right facet hypertrophy. Thecal sac remains patent. Moderate left with mild right foraminal narrowing. T12-L1: Negative interspace. Moderate facet hypertrophy. No significant stenosis. MRI LUMBAR SPINE FINDINGS Segmentation: Examination mildly limited as the patient was unable to  tolerate the full length of the exam. STIR sequence was not performed. Standard.  Lowest well-formed disc labeled the L5-S1 level. Alignment: 4 mm retrolisthesis of L2 on L3, progressed from previous. Alignment otherwise normal with preservation of the normal lumbar lordosis. Vertebrae: Susceptibility artifact from prior fusion at L3 through S1. Vertebral body height maintained without evidence for acute or chronic fracture. Underlying bone marrow signal intensity within normal limits. 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 No other discrete or worrisome osseous lesions. Progressive reactive endplate changes noted at the inferior endplate of L2. Conus medullaris and cauda  equina: Conus extends to the L1 level. Conus and cauda equina appear normal. Paraspinal and other soft tissues: Few scattered subcentimeter T2 hyperintense right renal cyst noted. Visualized visceral structures otherwise unremarkable. Disc levels: L1-2: Negative interspace. Mild to moderate bilateral facet hypertrophy. No significant canal or foraminal stenosis. L2-3: Progressive 4 mm retrolisthesis. Intervertebral disc space narrowing with diffuse disc bulge and disc desiccation. Reactive endplate changes with marginal endplate osteophytic spurring. Disc bulging asymmetric to the right. Moderate facet and ligament flavum hypertrophy. Resultant severe canal with moderate right worse than left L2 foraminal stenosis. L3-4: Prior posterior and interbody fusion. No significant residual spinal stenosis. Foramina are patent. L4-5: Prior fusion. No residual or recurrent canal or foraminal stenosis. L5-S1: Prior fusion with posterior decompression. No residual canal or foraminal stenosis. IMPRESSION: MR THORACIC SPINE IMPRESSION 1. No acute abnormality within the thoracic spine. No evidence for cord compression or severe spinal stenosis. 2. Multilevel degenerative disc bulging with facet hypertrophy with resultant mild to moderate bilateral foraminal narrowing at T5-6, T10-11, and T11-12 as above. 3. 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 MR LUMBAR SPINE IMPRESSION 1. No acute abnormality within the lumbar spine. No evidence for cord compression. 2. Prior fusion at L3-4 through L5-S1 without residual or recurrent stenosis. 3. 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 Electronically Signed   By: Jeannine Boga M.D.   On: 02/08/2019 19:10   Mr Lumbar Spine Wo Contrast  Result Date: 02/08/2019 CLINICAL DATA:  Initial evaluation for back pain, chronic right lower extremity pain. EXAM: MRI THORACIC AND LUMBAR SPINE WITHOUT CONTRAST  TECHNIQUE: Multiplanar and multiecho pulse sequences of the thoracic and lumbar spine were obtained without intravenous contrast. COMPARISON:  Prior MRI from 12/29/2015. FINDINGS: MRI THORACIC SPINE FINDINGS Alignment: Trace dextroscoliosis. Alignment otherwise normal with preservation of the normal thoracic kyphosis. No listhesis or subluxation. Vertebrae: Vertebral body height maintained without evidence for acute or chronic fracture. Underlying bone marrow signal intensity within normal limits. Few scattered benign hemangiomas noted within the T9 and T10 vertebral bodies. 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 No other discrete or worrisome osseous lesions. No other abnormal marrow edema. Cord: Signal intensity within the thoracic spinal cord is normal. Normal cord caliber morphology. Paraspinal and other soft tissues: Unremarkable. Disc levels: T1-2:  Unremarkable. T2-3: Left-sided facet hypertrophy.  No stenosis. T3-4: Small central disc protrusion indents the ventral thecal sac. Mild facet hypertrophy. No significant stenosis or cord deformity. T4-5:  Mild right greater than left facet hypertrophy.  No stenosis. T5-6: Minimal disc bulge. Mild right greater than left facet hypertrophy. No spinal stenosis. Mild right foraminal narrowing. T6-7: Diffuse disc bulge. Mild bilateral facet hypertrophy. Perineural cyst at the left neural foramen. No significant stenosis. T7-8: Diffuse disc bulge with superimposed small central disc protrusion indents the ventral thecal sac. Mild bilateral facet hypertrophy. Thecal sac remains  patent. No significant cord deformity. Foramina remain patent. T8-9: Diffuse disc bulge with mild bilateral facet hypertrophy. No significant stenosis. T9-10: Mild diffuse disc bulge. Mild to moderate facet hypertrophy. No significant stenosis. T10-11: Diffuse disc bulge. Moderate left greater than right facet hypertrophy. Thecal sac remains patent. Mild bilateral foraminal  narrowing. T11-12: Negative interspace. Moderate to advanced left greater than right facet hypertrophy. Thecal sac remains patent. Moderate left with mild right foraminal narrowing. T12-L1: Negative interspace. Moderate facet hypertrophy. No significant stenosis. MRI LUMBAR SPINE FINDINGS Segmentation: Examination mildly limited as the patient was unable to tolerate the full length of the exam. STIR sequence was not performed. Standard.  Lowest well-formed disc labeled the L5-S1 level. Alignment: 4 mm retrolisthesis of L2 on L3, progressed from previous. Alignment otherwise normal with preservation of the normal lumbar lordosis. Vertebrae: Susceptibility artifact from prior fusion at L3 through S1. Vertebral body height maintained without evidence for acute or chronic fracture. Underlying bone marrow signal intensity within normal limits. 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 No other discrete or worrisome osseous lesions. Progressive reactive endplate changes noted at the inferior endplate of L2. Conus medullaris and cauda equina: Conus extends to the L1 level. Conus and cauda equina appear normal. Paraspinal and other soft tissues: Few scattered subcentimeter T2 hyperintense right renal cyst noted. Visualized visceral structures otherwise unremarkable. Disc levels: L1-2: Negative interspace. Mild to moderate bilateral facet hypertrophy. No significant canal or foraminal stenosis. L2-3: Progressive 4 mm retrolisthesis. Intervertebral disc space narrowing with diffuse disc bulge and disc desiccation. Reactive endplate changes with marginal endplate osteophytic spurring. Disc bulging asymmetric to the right. Moderate facet and ligament flavum hypertrophy. Resultant severe canal with moderate right worse than left L2 foraminal stenosis. L3-4: Prior posterior and interbody fusion. No significant residual spinal stenosis. Foramina are patent. L4-5: Prior fusion. No residual or recurrent canal or  foraminal stenosis. L5-S1: Prior fusion with posterior decompression. No residual canal or foraminal stenosis. IMPRESSION: MR THORACIC SPINE IMPRESSION 1. No acute abnormality within the thoracic spine. No evidence for cord compression or severe spinal stenosis. 2. Multilevel degenerative disc bulging with facet hypertrophy with resultant mild to moderate bilateral foraminal narrowing at T5-6, T10-11, and T11-12 as above. 3. 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 MR LUMBAR SPINE IMPRESSION 1. No acute abnormality within the lumbar spine. No evidence for cord compression. 2. Prior fusion at L3-4 through L5-S1 without residual or recurrent stenosis. 3. 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 Electronically Signed   By: Jeannine Boga M.D.   On: 02/08/2019 19:10   US Venous Img Lower Right (dvt Study)  Result Date: 02/08/2019 CLINICAL DATA:  Right lower extremity pain and edema. Evaluate for DVT. EXAM: RIGHT LOWER EXTREMITY VENOUS DOPPLER ULTRASOUND TECHNIQUE: Gray-scale sonography with graded compression, as well as color Doppler and duplex ultrasound were performed to evaluate the lower extremity deep venous systems from the level of the common femoral vein and including the common femoral, femoral, profunda femoral, popliteal and calf veins including the posterior tibial, peroneal and gastrocnemius veins when visible. The superficial great saphenous vein was also interrogated. Spectral Doppler was utilized to evaluate flow at rest and with distal augmentation maneuvers in the common femoral, femoral and popliteal veins. COMPARISON:  None. FINDINGS: Contralateral Common Femoral Vein: Respiratory phasicity is normal and symmetric with the symptomatic side. No evidence of thrombus. Normal compressibility. Common Femoral Vein: No evidence of thrombus. Normal compressibility, respiratory phasicity and  response to  augmentation. Saphenofemoral Junction: No evidence of thrombus. Normal compressibility and flow on color Doppler imaging. Profunda Femoral Vein: No evidence of thrombus. Normal compressibility and flow on color Doppler imaging. Femoral Vein: No evidence of thrombus. Normal compressibility, respiratory phasicity and response to augmentation. Popliteal Vein: No evidence of thrombus. Normal compressibility, respiratory phasicity and response to augmentation. Calf Veins: No evidence of thrombus. Normal compressibility and flow on color Doppler imaging. Superficial Great Saphenous Vein: No evidence of thrombus. Normal compressibility. Venous Reflux:  None. Other Findings:  None. IMPRESSION: No evidence of DVT within the right lower extremity. Electronically Signed   By: Sandi Mariscal M.D.   On: 02/08/2019 15:54        Scheduled Meds:  amLODipine  10 mg Oral Daily   [START ON 02/11/2019] cloNIDine  0.2 mg Transdermal Weekly   gabapentin  100 mg Oral TID   heparin  5,000 Units Subcutaneous Q8H   labetalol  300 mg Oral TID   loratadine  10 mg Oral Daily   multivitamin with minerals  1 tablet Oral Daily   traZODone  50 mg Oral QHS   Continuous Infusions:  sodium chloride 150 mL/hr at 02/09/19 1017     LOS: 0 days    Time spent: 30 minutes    Joyce [name] Darleen Crocker, DO [facility] Pager [phone]  If 7PM-7AM, please contact night-coverage [email or web address] Password [facility] 02/09/2019, 11:27 AM

## 2019-02-09 NOTE — Progress Notes (Signed)
Patient has  not voided since arrival to the floor. Bladder scanned patient and patient only had 100cc of urine in bladder. Patient does not feel distended or have urge to urinate at this time.  Will pass on to day shift RN.

## 2019-02-10 LAB — UPEP/UIFE/LIGHT CHAINS/TP, 24-HR UR
% BETA, Urine: 25.1 %
ALPHA 1 URINE: 4.2 %
Albumin, U: 41.4 %
Alpha 2, Urine: 17.2 %
Free Kappa Lt Chains,Ur: 97.67 mg/L (ref 0.63–113.79)
Free Kappa/Lambda Ratio: 6.24 (ref 1.03–31.76)
Free Lambda Lt Chains,Ur: 15.65 mg/L — ABNORMAL HIGH (ref 0.47–11.77)
GAMMA GLOBULIN URINE: 12.1 %
Total Protein, Urine: 15.5 mg/dL

## 2019-02-10 LAB — BASIC METABOLIC PANEL
Anion gap: 7 (ref 5–15)
BUN: 40 mg/dL — ABNORMAL HIGH (ref 8–23)
CO2: 22 mmol/L (ref 22–32)
Calcium: 8.3 mg/dL — ABNORMAL LOW (ref 8.9–10.3)
Chloride: 113 mmol/L — ABNORMAL HIGH (ref 98–111)
Creatinine, Ser: 2.28 mg/dL — ABNORMAL HIGH (ref 0.44–1.00)
GFR calc Af Amer: 26 mL/min — ABNORMAL LOW (ref >60)
GFR calc non Af Amer: 22 mL/min — ABNORMAL LOW (ref >60)
Glucose, Bld: 67 mg/dL — ABNORMAL LOW (ref 70–99)
Potassium: 4.4 mmol/L (ref 3.5–5.1)
Sodium: 142 mmol/L (ref 135–145)

## 2019-02-10 LAB — PROTEIN ELECTROPHORESIS, SERUM
A/G Ratio: 1.4 (ref 0.7–1.7)
Albumin ELP: 3.2 g/dL (ref 2.9–4.4)
Alpha-1-Globulin: 0.2 g/dL (ref 0.0–0.4)
Alpha-2-Globulin: 0.6 g/dL (ref 0.4–1.0)
Beta Globulin: 1.1 g/dL (ref 0.7–1.3)
Gamma Globulin: 0.4 g/dL (ref 0.4–1.8)
Globulin, Total: 2.3 g/dL (ref 2.2–3.9)
Total Protein ELP: 5.5 g/dL — ABNORMAL LOW (ref 6.0–8.5)

## 2019-02-10 LAB — CK: Total CK: 379 U/L — ABNORMAL HIGH (ref 38–234)

## 2019-02-10 LAB — CBC
HCT: 34.1 % — ABNORMAL LOW (ref 36.0–46.0)
Hemoglobin: 10.1 g/dL — ABNORMAL LOW (ref 12.0–15.0)
MCH: 25.1 pg — ABNORMAL LOW (ref 26.0–34.0)
MCHC: 29.6 g/dL — ABNORMAL LOW (ref 30.0–36.0)
MCV: 84.8 fL (ref 80.0–100.0)
Platelets: 243 10*3/uL (ref 150–400)
RBC: 4.02 MIL/uL (ref 3.87–5.11)
RDW: 16.5 % — ABNORMAL HIGH (ref 11.5–15.5)
WBC: 7.1 10*3/uL (ref 4.0–10.5)
nRBC: 0 % (ref 0.0–0.2)

## 2019-02-10 LAB — FOLATE RBC
Folate, Hemolysate: 332 ng/mL
Folate, RBC: 1173 ng/mL (ref >498)
Hematocrit: 28.3 % — ABNORMAL LOW (ref 34.0–46.6)

## 2019-02-10 LAB — MAGNESIUM: Magnesium: 2.4 mg/dL (ref 1.7–2.4)

## 2019-02-10 LAB — HIV ANTIBODY (ROUTINE TESTING W REFLEX): HIV Screen 4th Generation wRfx: NONREACTIVE

## 2019-02-10 MED ORDER — METHYLPREDNISOLONE SODIUM SUCC 1000 MG IJ SOLR
INTRAMUSCULAR | Status: AC
  Filled 2019-02-10: qty 8

## 2019-02-10 MED ORDER — LACTATED RINGERS IV SOLN
INTRAVENOUS | Status: DC
  Administered 2019-02-10 – 2019-02-13 (×7): via INTRAVENOUS

## 2019-02-10 MED ORDER — SODIUM CHLORIDE 0.9 % IV SOLN
1000.0000 mg | Freq: Every day | INTRAVENOUS | Status: AC
  Administered 2019-02-10 – 2019-02-12 (×3): 1000 mg via INTRAVENOUS
  Filled 2019-02-10 (×3): qty 8

## 2019-02-10 NOTE — Evaluation (Signed)
Physical Therapy Evaluation Patient Details Name: Joyce [name] MRN: [id number] DOB: 23-Jul-1956 Today's Date: 02/10/2019   History of Present Illness  Joyce [name]  is a 63 y.o. female,w hypertension, hyperlipidemia, h/o stroke, ckd stage3, depression,  chronic right leg pain, apparently presents with c/o muscle spasm, pt notes being in [facility] and doing coccaine.  Pt notes that she has been taking Nsaids.  Pt denies fever, chills, rash, cough, cp, palp, sob, n/v, abd pain, diarrhea, brbpr, black stool, dysuria, hematuria.  Pt has not noticed any change in urine output.  Pt does note that she has had poor po intake recently. Patient was admitted for AKI on CKD stage III along with some mild rhabdomyolysis secondary to cocaine use.  She continues to have ongoing, severe muscle spasms to her lower extremities with negative work-up on MRI of the thoracic and lumbar spine.    Clinical Impression  Pt presents with new complaints of numbness to R foot and tingling circumferentially distal to R knee not previously told to RN or MD. Pt able to transfer supine to sit moving in slow, segmental fashion and using LLE to assist RLE to EOB. Pt requires assistance to physically lift RLE back into bed when returning to supine. Pt progresses from min assist to contact guard assist with sit<>stand transfers using RW with education to push from bed, avoid pulling on RW, and physically positions R foot even with L foot using BUE prior to rising. Pt favors LLE when rising and lowering and reports being unable to feel her RLE at all. Pt takes 2-3 shuffling steps at bedside demonstrating weight-shifting to both LE and sliding bil foot progression with BUE assist on RW for stability. Pt reports being unable to feel R foot, increasing her fear of falling and requiring return to bed. Pt complains of spasm type pains in bil calf and hips upon arrival, and continues to report that throughout treatment session. PT performed MMT while  pt seated at EOB and only able to palpate trace contraction in all muscle groups throughout RLE (1/5 for hip, knee and ankle). Pt with + clonus response demonstrating 1-2 beats with repeated testing and denies feeling therapist touch her foot. RN notified of new sensation complaints, MMT scores, + clonus on RLE, and returned to pt's room with therapist to perform assessment.  Recommending discharge to SNF at this time due to need for skilled interventions to improve strength, balance, endurance, safe ADL performance, gait and overall independence with functional mobility prior to d/c home.      Follow Up Recommendations SNF    Equipment Recommendations  Rolling walker with 5" wheels    Recommendations for Other Services       Precautions / Restrictions Precautions Precautions: Fall Restrictions Weight Bearing Restrictions: No      Mobility  Bed Mobility Overal bed mobility: Needs Assistance Bed Mobility: Supine to Sit;Sit to Supine     Supine to sit: Min guard Sit to supine: Min assist   General bed mobility comments: Supine to sit: requires increased time, segmental motion, uses LLE to bring RLE to EOB, use of bedrail to upright trunk; sit to supine: min assist for RLE lifting back into bed, slow, segmental movement  Transfers Overall transfer level: Needs assistance Equipment used: Rolling walker (2 wheeled) Transfers: Sit to/from Stand Sit to Stand: Min assist;Min guard         General transfer comment: Initially requiring min assist, educated to push from bed and avoid pulling on  RW, progressing to min guard assist, pt physically positions RLE even with LLE and with dependence on LLE to rise to standing, no loss of balance or RLE buckling noted in standing  Ambulation/Gait Ambulation/Gait assistance: Mod assist Gait Distance (Feet): 2 Feet Assistive device: Rolling walker (2 wheeled) Gait Pattern/deviations: Shuffle Gait velocity: decreased   General Gait Details:  slow, labored 3-4 shuffling steps forward and backward at bedisde, able to progress RLE without assistance via sliding forward/backward, limited due to lack of sensation with R foot increasing pt's fear of falling, no loss of balance  Stairs            Wheelchair Mobility    Modified Rankin (Stroke Patients Only)       Balance Overall balance assessment: Needs assistance Sitting-balance support: Feet supported;Bilateral upper extremity supported Sitting balance-Leahy Scale: Good Sitting balance - Comments: seated EOB   Standing balance support: During functional activity;Bilateral upper extremity supported Standing balance-Leahy Scale: Fair Standing balance comment: using RW                             Pertinent Vitals/Pain Pain Assessment: 0-10 Pain Score: 8 (Pt reports pain medication 2-3 hours ago and it is starting to wear off) Pain Location: both calf, lateral thigh R>L Pain Descriptors / Indicators: Tingling;Aching;Burning;Spasm(tingling bil feet) Pain Intervention(s): Limited activity within patient's tolerance;Monitored during session;Patient requesting pain meds-RN notified    Home Living Family/patient expects to be discharged to:: Private residence Living Arrangements: Spouse/significant other;Other (Comment)(mother in law) Available Help at Discharge: Family;Available 24 hours/day Type of Home: House Home Access: Stairs to enter Entrance Stairs-Rails: None Entrance Stairs-Number of Steps: 2 Home Layout: Two level;Laundry or work area in basement;Bed/bath upstairs(Pt reports living in basement, has to go upstairs for restroom and kitchen use; does have basement door but has to walk on paved and grassy surfaces) Home Equipment: Cane - quad;Bedside commode;Shower seat      Prior Function Level of Independence: Needs assistance;Independent with assistive device(s)   Gait / Transfers Assistance Needed: Pt reports spouse assists with getting out of  bed or chair, getting up and down stairs. Pt reports used quad cane more outside than inside, pt reports 5 min of ambulation before needing to sit and rest due to burning sensation in BLE hips, thighs, calfs.  ADL's / Homemaking Assistance Needed: pt reports spouse assists with dressing. Pt reports being able to contribute to cleaning and cooking.  Comments: Pt reports spouse has had recent surgeries, but does not require physical assistance from her. Pt reports family does the driving for her. Pt denies recent falls.     Hand Dominance   Dominant Hand: Right    Extremity/Trunk Assessment   Upper Extremity Assessment Upper Extremity Assessment: Overall WFL for tasks assessed    Lower Extremity Assessment Lower Extremity Assessment: RLE deficits/detail;LLE deficits/detail RLE Deficits / Details: MMT in sitting revealed 1/5 for hip abduction and adduction, knee flexion and extension, and ankle dorsiflexion and plantarflexion; + for 1-2 beat clonus on RLE RLE Sensation: decreased light touch(Pt reports unable to feel R foot at all, decreased sensation with soreness and tingling sensation circumferentially distal to R knee) LLE Deficits / Details: MMT grossly 4/5, tested in sitting LLE Sensation: decreased light touch(reports tingling throughout L foot)    Cervical / Trunk Assessment Cervical / Trunk Assessment: Normal  Communication   Communication: No difficulties  Cognition Arousal/Alertness: Awake/alert Behavior During Therapy: WFL for tasks assessed/performed Overall  Cognitive Status: Within Functional Limits for tasks assessed                                        General Comments      Exercises     Assessment/Plan    PT Assessment Patient needs continued PT services  PT Problem List Decreased strength;Decreased range of motion;Decreased activity tolerance;Decreased balance;Decreased mobility;Impaired sensation;Impaired tone;Pain       PT Treatment  Interventions DME instruction;Gait training;Functional mobility training;Therapeutic activities;Therapeutic exercise;Balance training;Neuromuscular re-education;Patient/family education    PT Goals (Current goals can be found in the Care Plan section)  Acute Rehab PT Goals Patient Stated Goal: increase strength and stability PT Goal Formulation: With patient Time For Goal Achievement: 02/24/19 Potential to Achieve Goals: Fair    Frequency Min 3X/week   Barriers to discharge        Co-evaluation               AM-PAC PT "6 Clicks" Mobility  Outcome Measure Help needed turning from your back to your side while in a flat bed without using bedrails?: A Little Help needed moving from lying on your back to sitting on the side of a flat bed without using bedrails?: A Little Help needed moving to and from a bed to a chair (including a wheelchair)?: A Little Help needed standing up from a chair using your arms (e.g., wheelchair or bedside chair)?: A Little Help needed to walk in hospital room?: A Lot Help needed climbing 3-5 steps with a railing? : Total 6 Click Score: 15    End of Session Equipment Utilized During Treatment: Gait belt Activity Tolerance: Patient tolerated treatment well;Patient limited by pain;Other (comment)(Pt limlited due to decreased RLE sensation) Patient left: in bed;with call bell/phone within reach;with nursing/sitter in room Nurse Communication: Mobility status PT Visit Diagnosis: Unsteadiness on feet (R26.81);Other abnormalities of gait and mobility (R26.89);Muscle weakness (generalized) (M62.81)    Time: 1320-1416 PT Time Calculation (min) (ACUTE ONLY): 56 min   Charges:   PT Evaluation $PT Eval Moderate Complexity: 1 Mod          Tori Fidencia Mccloud PT, DPT

## 2019-02-10 NOTE — Plan of Care (Signed)
  Problem: Acute Rehab PT Goals(only PT should resolve) Goal: Pt Will Go Supine/Side To Sit Outcome: Progressing Flowsheets (Taken 02/10/2019 1445) Pt will go Supine/Side to Sit: with modified independence Goal: Pt Will Go Sit To Supine/Side Outcome: Progressing Flowsheets (Taken 02/10/2019 1445) Pt will go Sit to Supine/Side: with modified independence Goal: Patient Will Transfer Sit To/From Stand Outcome: Progressing Flowsheets (Taken 02/10/2019 1445) Patient will transfer sit to/from stand: with supervision Goal: Pt Will Transfer Bed To Chair/Chair To Bed Outcome: Progressing Flowsheets (Taken 02/10/2019 1445) Pt will Transfer Bed to Chair/Chair to Bed: min guard assist Goal: Pt Will Ambulate Outcome: Progressing Flowsheets (Taken 02/10/2019 1445) Pt will Ambulate:  25 feet  with min guard assist  with least restrictive assistive device   Talbot Grumbling PT, DPT

## 2019-02-10 NOTE — Progress Notes (Signed)
PROGRESS NOTE    Joyce [name]  LSL:[id number] DOB: 06-23-1956 DOA: 02/08/2019 PCP: Lucia Gaskins, MD   Brief Narrative:  Per HPI: [name] y.o.female,w hypertension, hyperlipidemia, h/o stroke, ckd stage3, depression, chronic right leg pain, apparently presents with c/o muscle spasm, pt notes being in [facility] and doing coccaine. Pt notes that she has been taking Nsaids. Pt denies fever, chills, rash, cough, cp, palp, sob, n/v, abd pain, diarrhea, brbpr, black stool, dysuria, hematuria. Pt has not noticed any change in urine output. Pt does note that she has had poor po intake recently.   Patient was admitted for AKI on CKD stage III along with some mild rhabdomyolysis secondary to cocaine use.  She continues to have ongoing, severe muscle spasms to her lower extremities with negative work-up on MRI of the thoracic and lumbar spine.   Assessment & Plan:   Principal Problem:   ARF (acute renal failure) (HCC) Active Problems:   Essential hypertension, benign   Hyperlipidemia   Anemia   ARF on CKD stage 3, appears to intrinsic with Fena 1.2% Check urine sodium, urine protein, urine creatinine , urine eoinophils Check spep, upep ESR is 48 Check renal ultrasound which is pending Hydrate with lr iv at aggressive rate and monitor strict I's and O's Check BMP in a.m.  Mild Rhabdomyolysis likely secondary to admitted coccaine use and dehydration TSH 4.01 Repeat CK level pending  Hypertension-stable STOP Lisinopril / hydrochlorothiazide temporarily Cont Amlodipine '10mg'$  po qday Cont Labetalol '300mg'$  po bid Cont Clonidine patch 0.'2mg'$  Topically q week  Hyperlipidemia STOP Simvastatin, due to cpk elevation  Chronic right lower ext pain Cont Gabapentin '300mg'$  po tid => at lower dose of '100mg'$  po tid due to ARF Continue on Robaxin as needed Dilaudid added for severe pain and cramps  Iron deficiency anemia-stable 1 dose of Feraheme given 7/7 No overt  bleeding noted   DVT prophylaxis: Heparin Code Status: Full Family Communication: None at bedside Disposition Plan: Continue IV fluid hydration and assess for improvement in kidney injury.  Pain management for muscle spasms.  PT evaluation as well as heat ordered for lower extremities.  Patient requires inpatient stay on account of aggressive IV fluid administration and treatment of AKI.   Consultants:   None  Procedures:   None  Antimicrobials:   None  Subjective: Patient seen and evaluated today with ongoing calf cramping and spasms.  She has been urinating and has been able to get up out of bed.  She did have some significant bowel movements noted yesterday after magnesium was given.  Objective: Vitals:   02/09/19 2018 02/10/19 0500 02/10/19 0600 02/10/19 0919  BP: (!) 132/58 (!) 146/50  (!) 151/62  Pulse:    60  Resp: 16     Temp: 98.1 F (36.7 C) 97.6 F (36.4 C)    TempSrc: Oral Oral    SpO2: 100% 100%    Weight:   112.6 kg   Height:        Intake/Output Summary (Last 24 hours) at 02/10/2019 1048 Last data filed at 02/09/2019 1700 Gross per 24 hour  Intake 1626.24 ml  Output 1300 ml  Net 326.24 ml   Filed Weights   02/08/19 2144 02/09/19 0630 02/10/19 0600  Weight: 107 kg 109.1 kg 112.6 kg    Examination:  General exam: Appears calm and comfortable  Respiratory system: Clear to auscultation. Respiratory effort normal. Cardiovascular system: S1 & S2 heard, RRR. No JVD, murmurs, rubs, gallops or clicks. No pedal  edema. Gastrointestinal system: Abdomen is nondistended, soft and nontender. No organomegaly or masses felt. Normal bowel sounds heard. Central nervous system: Alert and oriented. No focal neurological deficits. Extremities: Symmetric 5 x 5 power. Skin: No rashes, lesions or ulcers Psychiatry: Judgement and insight appear normal. Mood & affect appropriate.     Data Reviewed: I have personally reviewed following labs and imaging  studies  CBC: Recent Labs  Lab 02/08/19 1426 02/09/19 0418 02/10/19 0607  WBC 10.6* 9.3 7.1  NEUTROABS 7.4  --   --   HGB 10.6* 9.3* 10.1*  HCT 33.7* 30.1* 34.1*  MCV 80.0 82.9 84.8  PLT 313 230 102   Basic Metabolic Panel: Recent Labs  Lab 02/08/19 1426 02/09/19 0418 02/10/19 0607  NA 140 139 142  K 3.5 3.3* 4.4  CL 108 111 113*  CO2 19* 19* 22  GLUCOSE 132* 139* 67*  BUN 47* 44* 40*  CREATININE 3.12* 2.75* 2.28*  CALCIUM 8.8* 8.0* 8.3*  MG  --   --  2.4   GFR: Estimated Creatinine Clearance: 32.7 mL/min (A) (by C-G formula based on SCr of 2.28 mg/dL (H)). Liver Function Tests: Recent Labs  Lab 02/08/19 1426 02/09/19 0418  AST 31 24  ALT 16 14  ALKPHOS 85 82  BILITOT 0.4 0.2*  PROT 7.0 5.7*  ALBUMIN 3.7 3.1*   No results for input(s): LIPASE, AMYLASE in the last 168 hours. No results for input(s): AMMONIA in the last 168 hours. Coagulation Profile: No results for input(s): INR, PROTIME in the last 168 hours. Cardiac Enzymes: Recent Labs  Lab 02/08/19 1508 02/09/19 0418  CKTOTAL 763* 463*  CKMB  --  10.1*   BNP (last 3 results) No results for input(s): PROBNP in the last 8760 hours. HbA1C: No results for input(s): HGBA1C in the last 72 hours. CBG: No results for input(s): GLUCAP in the last 168 hours. Lipid Profile: No results for input(s): CHOL, HDL, LDLCALC, TRIG, CHOLHDL, LDLDIRECT in the last 72 hours. Thyroid Function Tests: Recent Labs    02/09/19 0418  TSH 4.001   Anemia Panel: Recent Labs    02/09/19 0418  VITAMINB12 309  FERRITIN 35  TIBC 400  IRON 26*   Sepsis Labs: No results for input(s): PROCALCITON, LATICACIDVEN in the last 168 hours.  Recent Results (from the past 240 hour(s))  SARS Coronavirus 2 (CEPHEID - Performed in [place] hospital lab), Hosp Order     Status: None   Collection Time: 02/08/19  8:35 PM   Specimen: Nasopharyngeal Swab  Result Value Ref Range Status   SARS Coronavirus 2 NEGATIVE NEGATIVE  Final    Comment: (NOTE) If result is NEGATIVE SARS-CoV-2 target nucleic acids are NOT DETECTED. The SARS-CoV-2 RNA is generally detectable in upper and lower  respiratory specimens during the acute phase of infection. The lowest  concentration of SARS-CoV-2 viral copies this assay can detect is 250  copies / mL. A negative result does not preclude SARS-CoV-2 infection  and should not be used as the sole basis for treatment or other  patient management decisions.  A negative result may occur with  improper specimen collection / handling, submission of specimen other  than nasopharyngeal swab, presence of viral mutation(s) within the  areas targeted by this assay, and inadequate number of viral copies  (<250 copies / mL). A negative result must be combined with clinical  observations, patient history, and epidemiological information. If result is POSITIVE SARS-CoV-2 target nucleic acids are DETECTED. The SARS-CoV-2 RNA is  generally detectable in upper and lower  respiratory specimens dur ing the acute phase of infection.  Positive  results are indicative of active infection with SARS-CoV-2.  Clinical  correlation with patient history and other diagnostic information is  necessary to determine patient infection status.  Positive results do  not rule out bacterial infection or co-infection with other viruses. If result is PRESUMPTIVE POSTIVE SARS-CoV-2 nucleic acids MAY BE PRESENT.   A presumptive positive result was obtained on the submitted specimen  and confirmed on repeat testing.  While 2019 novel coronavirus  (SARS-CoV-2) nucleic acids may be present in the submitted sample  additional confirmatory testing may be necessary for epidemiological  and / or clinical management purposes  to differentiate between  SARS-CoV-2 and other Sarbecovirus currently known to infect humans.  If clinically indicated additional testing with an alternate test  methodology [phone]) is advised. The  SARS-CoV-2 RNA is generally  detectable in upper and lower respiratory sp ecimens during the acute  phase of infection. The expected result is Negative. Fact Sheet for Patients:  StrictlyIdeas.no Fact Sheet for Healthcare Providers: BankingDealers.co.za This test is not yet approved or cleared by the [place] FDA and has been authorized for detection and/or diagnosis of SARS-CoV-2 by FDA under an Emergency Use Authorization (EUA).  This EUA will remain in effect (meaning this test can be used) for the duration of the COVID-19 declaration under Section 564(b)(1) of the Act, 21 U.S.C. section 360bbb-3(b)(1), unless the authorization is terminated or revoked sooner. Performed at [facility], [address] [place], [place] [address]          Radiology Studies: Mr Thoracic Spine Wo Contrast  Result Date: 02/08/2019 CLINICAL DATA:  Initial evaluation for back pain, chronic right lower extremity pain. EXAM: MRI THORACIC AND LUMBAR SPINE WITHOUT CONTRAST TECHNIQUE: Multiplanar and multiecho pulse sequences of the thoracic and lumbar spine were obtained without intravenous contrast. COMPARISON:  Prior MRI from 12/29/2015. FINDINGS: MRI THORACIC SPINE FINDINGS Alignment: Trace dextroscoliosis. Alignment otherwise normal with preservation of the normal thoracic kyphosis. No listhesis or subluxation. Vertebrae: Vertebral body height maintained without evidence for acute or chronic fracture. Underlying bone marrow signal intensity within normal limits. Few scattered benign hemangiomas noted within the T9 and T10 vertebral bodies. 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 No other discrete or worrisome osseous lesions. No other abnormal marrow edema. Cord: Signal intensity within the thoracic spinal cord is normal. Normal cord caliber morphology. Paraspinal and other soft tissues: Unremarkable. Disc levels: T1-2:  Unremarkable. T2-3:  Left-sided facet hypertrophy.  No stenosis. T3-4: Small central disc protrusion indents the ventral thecal sac. Mild facet hypertrophy. No significant stenosis or cord deformity. T4-5:  Mild right greater than left facet hypertrophy.  No stenosis. T5-6: Minimal disc bulge. Mild right greater than left facet hypertrophy. No spinal stenosis. Mild right foraminal narrowing. T6-7: Diffuse disc bulge. Mild bilateral facet hypertrophy. Perineural cyst at the left neural foramen. No significant stenosis. T7-8: Diffuse disc bulge with superimposed small central disc protrusion indents the ventral thecal sac. Mild bilateral facet hypertrophy. Thecal sac remains patent. No significant cord deformity. Foramina remain patent. T8-9: Diffuse disc bulge with mild bilateral facet hypertrophy. No significant stenosis. T9-10: Mild diffuse disc bulge. Mild to moderate facet hypertrophy. No significant stenosis. T10-11: Diffuse disc bulge. Moderate left greater than right facet hypertrophy. Thecal sac remains patent. Mild bilateral foraminal narrowing. T11-12: Negative interspace. Moderate to advanced left greater than right facet hypertrophy. Thecal sac remains patent. Moderate left  with mild right foraminal narrowing. T12-L1: Negative interspace. Moderate facet hypertrophy. No significant stenosis. MRI LUMBAR SPINE FINDINGS Segmentation: Examination mildly limited as the patient was unable to tolerate the full length of the exam. STIR sequence was not performed. Standard.  Lowest well-formed disc labeled the L5-S1 level. Alignment: 4 mm retrolisthesis of L2 on L3, progressed from previous. Alignment otherwise normal with preservation of the normal lumbar lordosis. Vertebrae: Susceptibility artifact from prior fusion at L3 through S1. Vertebral body height maintained without evidence for acute or chronic fracture. Underlying bone marrow signal intensity within normal limits. 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 No  other discrete or worrisome osseous lesions. Progressive reactive endplate changes noted at the inferior endplate of L2. Conus medullaris and cauda equina: Conus extends to the L1 level. Conus and cauda equina appear normal. Paraspinal and other soft tissues: Few scattered subcentimeter T2 hyperintense right renal cyst noted. Visualized visceral structures otherwise unremarkable. Disc levels: L1-2: Negative interspace. Mild to moderate bilateral facet hypertrophy. No significant canal or foraminal stenosis. L2-3: Progressive 4 mm retrolisthesis. Intervertebral disc space narrowing with diffuse disc bulge and disc desiccation. Reactive endplate changes with marginal endplate osteophytic spurring. Disc bulging asymmetric to the right. Moderate facet and ligament flavum hypertrophy. Resultant severe canal with moderate right worse than left L2 foraminal stenosis. L3-4: Prior posterior and interbody fusion. No significant residual spinal stenosis. Foramina are patent. L4-5: Prior fusion. No residual or recurrent canal or foraminal stenosis. L5-S1: Prior fusion with posterior decompression. No residual canal or foraminal stenosis. IMPRESSION: MR THORACIC SPINE IMPRESSION 1. No acute abnormality within the thoracic spine. No evidence for cord compression or severe spinal stenosis. 2. Multilevel degenerative disc bulging with facet hypertrophy with resultant mild to moderate bilateral foraminal narrowing at T5-6, T10-11, and T11-12 as above. 3. 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 MR LUMBAR SPINE IMPRESSION 1. No acute abnormality within the lumbar spine. No evidence for cord compression. 2. Prior fusion at L3-4 through L5-S1 without residual or recurrent stenosis. 3. 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 Electronically Signed   By: Jeannine Boga M.D.   On: 02/08/2019 19:10   Mr Lumbar Spine Wo Contrast  Result Date:  02/08/2019 CLINICAL DATA:  Initial evaluation for back pain, chronic right lower extremity pain. EXAM: MRI THORACIC AND LUMBAR SPINE WITHOUT CONTRAST TECHNIQUE: Multiplanar and multiecho pulse sequences of the thoracic and lumbar spine were obtained without intravenous contrast. COMPARISON:  Prior MRI from 12/29/2015. FINDINGS: MRI THORACIC SPINE FINDINGS Alignment: Trace dextroscoliosis. Alignment otherwise normal with preservation of the normal thoracic kyphosis. No listhesis or subluxation. Vertebrae: Vertebral body height maintained without evidence for acute or chronic fracture. Underlying bone marrow signal intensity within normal limits. Few scattered benign hemangiomas noted within the T9 and T10 vertebral bodies. 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 No other discrete or worrisome osseous lesions. No other abnormal marrow edema. Cord: Signal intensity within the thoracic spinal cord is normal. Normal cord caliber morphology. Paraspinal and other soft tissues: Unremarkable. Disc levels: T1-2:  Unremarkable. T2-3: Left-sided facet hypertrophy.  No stenosis. T3-4: Small central disc protrusion indents the ventral thecal sac. Mild facet hypertrophy. No significant stenosis or cord deformity. T4-5:  Mild right greater than left facet hypertrophy.  No stenosis. T5-6: Minimal disc bulge. Mild right greater than left facet hypertrophy. No spinal stenosis. Mild right foraminal narrowing. T6-7: Diffuse disc bulge. Mild bilateral facet hypertrophy. Perineural cyst at the  left neural foramen. No significant stenosis. T7-8: Diffuse disc bulge with superimposed small central disc protrusion indents the ventral thecal sac. Mild bilateral facet hypertrophy. Thecal sac remains patent. No significant cord deformity. Foramina remain patent. T8-9: Diffuse disc bulge with mild bilateral facet hypertrophy. No significant stenosis. T9-10: Mild diffuse disc bulge. Mild to moderate facet hypertrophy. No  significant stenosis. T10-11: Diffuse disc bulge. Moderate left greater than right facet hypertrophy. Thecal sac remains patent. Mild bilateral foraminal narrowing. T11-12: Negative interspace. Moderate to advanced left greater than right facet hypertrophy. Thecal sac remains patent. Moderate left with mild right foraminal narrowing. T12-L1: Negative interspace. Moderate facet hypertrophy. No significant stenosis. MRI LUMBAR SPINE FINDINGS Segmentation: Examination mildly limited as the patient was unable to tolerate the full length of the exam. STIR sequence was not performed. Standard.  Lowest well-formed disc labeled the L5-S1 level. Alignment: 4 mm retrolisthesis of L2 on L3, progressed from previous. Alignment otherwise normal with preservation of the normal lumbar lordosis. Vertebrae: Susceptibility artifact from prior fusion at L3 through S1. Vertebral body height maintained without evidence for acute or chronic fracture. Underlying bone marrow signal intensity within normal limits. 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 No other discrete or worrisome osseous lesions. Progressive reactive endplate changes noted at the inferior endplate of L2. Conus medullaris and cauda equina: Conus extends to the L1 level. Conus and cauda equina appear normal. Paraspinal and other soft tissues: Few scattered subcentimeter T2 hyperintense right renal cyst noted. Visualized visceral structures otherwise unremarkable. Disc levels: L1-2: Negative interspace. Mild to moderate bilateral facet hypertrophy. No significant canal or foraminal stenosis. L2-3: Progressive 4 mm retrolisthesis. Intervertebral disc space narrowing with diffuse disc bulge and disc desiccation. Reactive endplate changes with marginal endplate osteophytic spurring. Disc bulging asymmetric to the right. Moderate facet and ligament flavum hypertrophy. Resultant severe canal with moderate right worse than left L2 foraminal stenosis. L3-4: Prior  posterior and interbody fusion. No significant residual spinal stenosis. Foramina are patent. L4-5: Prior fusion. No residual or recurrent canal or foraminal stenosis. L5-S1: Prior fusion with posterior decompression. No residual canal or foraminal stenosis. IMPRESSION: MR THORACIC SPINE IMPRESSION 1. No acute abnormality within the thoracic spine. No evidence for cord compression or severe spinal stenosis. 2. Multilevel degenerative disc bulging with facet hypertrophy with resultant mild to moderate bilateral foraminal narrowing at T5-6, T10-11, and T11-12 as above. 3. 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 MR LUMBAR SPINE IMPRESSION 1. No acute abnormality within the lumbar spine. No evidence for cord compression. 2. Prior fusion at L3-4 through L5-S1 without residual or recurrent stenosis. 3. 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 Electronically Signed   By: Jeannine Boga M.D.   On: 02/08/2019 19:10   US Renal  Result Date: 02/09/2019 CLINICAL DATA:  Acute renal failure EXAM: RENAL / URINARY TRACT ULTRASOUND COMPLETE COMPARISON:  None. FINDINGS: Right Kidney: Renal measurements: 9.6 x 4.5 x 5 cm = volume: 113 mL . Echogenicity within normal limits. No mass or hydronephrosis visualized. Left Kidney: Renal measurements: 9.9 x 4.2 x 4.5 cm = volume: 98 mL. Echogenicity within normal limits. No mass or hydronephrosis visualized. Bladder: Incompletely distended and not visualized. IMPRESSION: 1. No hydronephrosis or other reversible finding. 2. Symmetric renal size. Electronically Signed   By: Monte Fantasia M.D.   On: 02/09/2019 13:24   US Venous Img Lower Right (dvt Study)  Result Date: 02/08/2019 CLINICAL DATA:  Right lower extremity pain  and edema. Evaluate for DVT. EXAM: RIGHT LOWER EXTREMITY VENOUS DOPPLER ULTRASOUND TECHNIQUE: Gray-scale sonography with graded compression, as well as color Doppler and  duplex ultrasound were performed to evaluate the lower extremity deep venous systems from the level of the common femoral vein and including the common femoral, femoral, profunda femoral, popliteal and calf veins including the posterior tibial, peroneal and gastrocnemius veins when visible. The superficial great saphenous vein was also interrogated. Spectral Doppler was utilized to evaluate flow at rest and with distal augmentation maneuvers in the common femoral, femoral and popliteal veins. COMPARISON:  None. FINDINGS: Contralateral Common Femoral Vein: Respiratory phasicity is normal and symmetric with the symptomatic side. No evidence of thrombus. Normal compressibility. Common Femoral Vein: No evidence of thrombus. Normal compressibility, respiratory phasicity and response to augmentation. Saphenofemoral Junction: No evidence of thrombus. Normal compressibility and flow on color Doppler imaging. Profunda Femoral Vein: No evidence of thrombus. Normal compressibility and flow on color Doppler imaging. Femoral Vein: No evidence of thrombus. Normal compressibility, respiratory phasicity and response to augmentation. Popliteal Vein: No evidence of thrombus. Normal compressibility, respiratory phasicity and response to augmentation. Calf Veins: No evidence of thrombus. Normal compressibility and flow on color Doppler imaging. Superficial Great Saphenous Vein: No evidence of thrombus. Normal compressibility. Venous Reflux:  None. Other Findings:  None. IMPRESSION: No evidence of DVT within the right lower extremity. Electronically Signed   By: Sandi Mariscal M.D.   On: 02/08/2019 15:54        Scheduled Meds:  amLODipine  10 mg Oral Daily   [START ON 02/11/2019] cloNIDine  0.2 mg Transdermal Weekly   gabapentin  100 mg Oral TID   heparin  5,000 Units Subcutaneous Q8H   labetalol  300 mg Oral TID   loratadine  10 mg Oral Daily   multivitamin with minerals  1 tablet Oral Daily   traZODone  50 mg Oral  QHS   Continuous Infusions:   LOS: 1 day    Time spent: 30 minutes    Ugo Thoma Darleen Crocker, DO [facility] Pager [phone]  If 7PM-7AM, please contact night-coverage [email or web address] Password [facility] 02/10/2019, 10:48 AM
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
## 2019-02-11 ENCOUNTER — Inpatient Hospital Stay (HOSPITAL_COMMUNITY): Payer: Medicare Other

## 2019-02-11 LAB — BASIC METABOLIC PANEL
Anion gap: 11 (ref 5–15)
BUN: 31 mg/dL — ABNORMAL HIGH (ref 8–23)
CO2: 20 mmol/L — ABNORMAL LOW (ref 22–32)
Calcium: 9 mg/dL (ref 8.9–10.3)
Chloride: 108 mmol/L (ref 98–111)
Creatinine, Ser: 1.65 mg/dL — ABNORMAL HIGH (ref 0.44–1.00)
GFR calc Af Amer: 38 mL/min — ABNORMAL LOW (ref >60)
GFR calc non Af Amer: 33 mL/min — ABNORMAL LOW (ref >60)
Glucose, Bld: 130 mg/dL — ABNORMAL HIGH (ref 70–99)
Potassium: 4.7 mmol/L (ref 3.5–5.1)
Sodium: 139 mmol/L (ref 135–145)

## 2019-02-11 LAB — CBC
HCT: 34 % — ABNORMAL LOW (ref 36.0–46.0)
Hemoglobin: 10.4 g/dL — ABNORMAL LOW (ref 12.0–15.0)
MCH: 25.4 pg — ABNORMAL LOW (ref 26.0–34.0)
MCHC: 30.6 g/dL (ref 30.0–36.0)
MCV: 82.9 fL (ref 80.0–100.0)
Platelets: 241 10*3/uL (ref 150–400)
RBC: 4.1 MIL/uL (ref 3.87–5.11)
RDW: 15.9 % — ABNORMAL HIGH (ref 11.5–15.5)
WBC: 5.9 10*3/uL (ref 4.0–10.5)
nRBC: 0 % (ref 0.0–0.2)

## 2019-02-11 MED ORDER — CLOPIDOGREL BISULFATE 75 MG PO TABS: 75.0000 mg | ORAL_TABLET | Freq: Every day | ORAL | Status: DC

## 2019-02-11 MED ORDER — HYDRALAZINE HCL 10 MG PO TABS
10.0000 mg | ORAL_TABLET | Freq: Three times a day (TID) | ORAL | Status: DC
  Administered 2019-02-11 – 2019-02-12 (×3): 10 mg via ORAL
  Filled 2019-02-11 (×3): qty 1

## 2019-02-11 MED ORDER — HYDRALAZINE HCL 20 MG/ML IJ SOLN
10.0000 mg | INTRAMUSCULAR | Status: DC | PRN
  Administered 2019-02-17: 10 mg via INTRAVENOUS
  Filled 2019-02-11: qty 1

## 2019-02-11 NOTE — Progress Notes (Signed)
Physical Therapy Treatment Patient Details Name: Joyce [name] MRN: [id number] DOB: 11-Oct-1955 Today's Date: 02/11/2019    History of Present Illness Joyce [name]  is a 63 y.o. female,w hypertension, hyperlipidemia, h/o stroke, ckd stage3, depression,  chronic right leg pain, apparently presents with c/o muscle spasm, pt notes being in [facility] and doing coccaine.  Pt notes that she has been taking Nsaids.  Pt denies fever, chills, rash, cough, cp, palp, sob, n/v, abd pain, diarrhea, brbpr, black stool, dysuria, hematuria.  Pt has not noticed any change in urine output.  Pt does note that she has had poor po intake recently. Patient was admitted for AKI on CKD stage III along with some mild rhabdomyolysis secondary to cocaine use.  She continues to have ongoing, severe muscle spasms to her lower extremities with negative work-up on MRI of the thoracic and lumbar spine.    PT Comments    Patient agreeable for therapy and demonstrates some movement of right hip abductors during supine to sitting, and able to actively complete a RLE hip raise during seated marching in place exercises, otherwise patient unable to complete right knee extension or ankle dorsi/plantar flexion against or without gravity.  Patient able to take a few steps at bedside, but required Max assist to advance RLE, limited secondary to c/o fatigue and tolerated sitting up in chair after therapy.  Patient will benefit from continued physical therapy in hospital and recommended venue below to increase strength, balance, endurance for safe ADLs and gait.   Follow Up Recommendations  SNF     Equipment Recommendations  Rolling walker with 5" wheels    Recommendations for Other Services       Precautions / Restrictions Precautions Precautions: Fall Restrictions Weight Bearing Restrictions: No    Mobility  Bed Mobility Overal bed mobility: Needs Assistance Bed Mobility: Supine to Sit     Supine to sit: Min assist      General bed mobility comments: required assistance to move RLE  Transfers Overall transfer level: Needs assistance Equipment used: Rolling walker (2 wheeled) Transfers: Sit to/from [facility] Sit to Stand: Min guard;Min assist Stand pivot transfers: Min assist;Mod assist       General transfer comment: slow labored movement with difficulty moving RLE due to weakness  Ambulation/Gait Ambulation/Gait assistance: Mod assist Gait Distance (Feet): 6 Feet Assistive device: Rolling walker (2 wheeled) Gait Pattern/deviations: Decreased step length - right;Decreased stance time - right;Decreased stride length Gait velocity: slow   General Gait Details: limited to taking steps at bedside requiring manual assistance to advance RLE when taking steps, no buckling of right knee when taking steps with LLE possibly due to reliance on BUE strength   Stairs             Wheelchair Mobility    Modified Rankin (Stroke Patients Only)       Balance Overall balance assessment: Needs assistance Sitting-balance support: Feet supported;No upper extremity supported Sitting balance-Leahy Scale: Fair Sitting balance - Comments: seated EOB, tends to lean backwards while completing BLE exercises Postural control: Posterior lean Standing balance support: During functional activity;Bilateral upper extremity supported Standing balance-Leahy Scale: Fair Standing balance comment: using RW                            Cognition Arousal/Alertness: Awake/alert Behavior During Therapy: WFL for tasks assessed/performed Overall Cognitive Status: Within Functional Limits for tasks assessed  Exercises General Exercises - Lower Extremity Long Arc Quad: Seated;AROM;AAROM;Both;10 reps Hip Flexion/Marching: Seated;AROM;AAROM;Both;10 reps Toe Raises: Seated;AROM;AAROM;Both;10 reps Heel Raises: Seated;AAROM;AROM;Both;10  reps    General Comments        Pertinent Vitals/Pain Pain Assessment: 0-10 Pain Score: 8  Pain Location: RLE from hip to foot, and some pain in left foot Pain Descriptors / Indicators: Tingling;Aching;Burning;Spasm Pain Intervention(s): Limited activity within patient's tolerance;Monitored during session;Premedicated before session    Home Living                      Prior Function            PT Goals (current goals can now be found in the care plan section) Acute Rehab PT Goals Patient Stated Goal: increase strength and stability PT Goal Formulation: With patient Time For Goal Achievement: 02/24/19 Potential to Achieve Goals: Good Progress towards PT goals: Progressing toward goals    Frequency    Min 3X/week      PT Plan Current plan remains appropriate    Co-evaluation              AM-PAC PT "6 Clicks" Mobility   Outcome Measure  Help needed turning from your back to your side while in a flat bed without using bedrails?: A Little Help needed moving from lying on your back to sitting on the side of a flat bed without using bedrails?: A Little Help needed moving to and from a bed to a chair (including a wheelchair)?: A Lot Help needed standing up from a chair using your arms (e.g., wheelchair or bedside chair)?: A Little Help needed to walk in hospital room?: A Lot Help needed climbing 3-5 steps with a railing? : Total 6 Click Score: 14    End of Session Equipment Utilized During Treatment: Gait belt Activity Tolerance: Patient tolerated treatment well;Patient limited by pain;Patient limited by fatigue Patient left: in chair;with call bell/phone within reach Nurse Communication: Mobility status PT Visit Diagnosis: Unsteadiness on feet (R26.81);Other abnormalities of gait and mobility (R26.89);Muscle weakness (generalized) (M62.81)     Time: 0277-4128 PT Time Calculation (min) (ACUTE ONLY): 25 min  Charges:  $Therapeutic Exercise: 8-22  mins $Therapeutic Activity: 8-22 mins                     2:35 PM, 02/11/19 Lonell Grandchild, MPT Physical Therapist with [facility] [phone] office [phone] mobile phone

## 2019-02-11 NOTE — Progress Notes (Signed)
[place] A. Merlene Laughter, MD     [email or web address]          ARETTA STETZEL is an 63 y.o. female.   Assessment/Plan: 1.  R leg weakness with spasms and other symptoms suggestive of myelopathy but MRI has been negative.  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  Suspect this is the cause of her symptomatology at least in part.  There also appears to be severe central canal stenosis at L2-L3 which also is not potential etiology.  The patient's brain MRI finding shows significant white matter disease although this has improved dramatically since her scan a year ago.  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  We also entertained the possibility of cocaine vasculopathy.  Given the acute infarct seen on MRI today, the patient will be started on Plavix.  I think we should do a spinal tap to further assess the possibility of demyelinating disease.  We will hold the Plavix until then.  She is agreeable to doing a spinal tap.  Heparin will also be held until the spinal tap is done.  We will continue with the high-dose steroids.  Echo and carotids will also be considered at least the spinal tap was done.  The patient would likely need inpatient rehabilitation for her deficits.    The patient reports that overall things are the same.  She still is continuing to have all her symptoms including bowel incontinence.  The right leg weakness is unchanged.  Her previous work-up was reviewed extensively.   GENERAL: This very pleasant overweight female who is in no acute distress.  HEENT: Neck is supple and no trauma appreciated.  There is full range of motion of the neck.  ABDOMEN: soft  EXTREMITIES: No edema; there is slight range of motion limitation of  the left shoulder due to pain.  BACK: the alignment is normal.  There is no significant pain on palpation of the lumbar thoracic spine.  SKIN: Normal by inspection.    MENTAL STATUS: Alert and oriented. Speech, language and cognition are generally intact. Judgment and insight normal.   CRANIAL NERVES: Pupils are equal, round and reactive to light and accomodation; extra ocular movements are full, there is no significant nystagmus; visual fields are full; upper and lower facial muscles are normal in strength and symmetric, there is no flattening of the nasolabial folds; tongue is midline; uvula is midline; shoulder elevation is normal.  MOTOR: Left deltoid is 4-/5.  The other distal muscles of the left upper extremity are normal.  Bulk and tone are normal of the left upper extremity.  The right upper extremity shows normal tone and bulk.  The strength is graded as 4+/5 in particular the triceps and deltoids.  Left hip flexion is 4+ and dorsiflexion 5.  Right hip flexion is 2/5 and dorsiflexion 3.  COORDINATION: Left finger to nose is normal, right finger to nose is normal, No rest tremor; no intention tremor; no postural tremor; no bradykinesia.  REFLEXES: Deep tendon reflexes are normal involving the right upper extremity.  The radial response is brisk.  The brachialis and biceps are normal.  The left knee jerk is somewhat brisk.  The right knee response is absent status post total knee arthroplasty. Plantar reflexes are  flexor bilaterally.   SENSATION: There is anesthesia of the right foot to just above the ankle.  There is reduced sensation to pinprick and light temperature involving the right trunk region with a sensory level of about T5.  The left side is normal..          PRIOR NOTE 1.  Acute right-sided symptoms of weakness and numbness with MRI findings showing numerous white matter lesions that is concerning: Differential diagnosis includes multiple sclerosis, vasculitis,  cocaine induced vasculopathy and severe case of hypertensive changes from poorly controlled hypertension.  It is also possible that the patient may have a MRI negative infarct.  Patient has been started on Plavix.  Blood pressure control is warranted.  2.  Extensive white matter lesions: Differential as stated above.  Cervical spine MRI is recommended.  Additional labs to be obtained -RPR, C-reactive protein and ANA.  3.  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  Prior neurological events of right-sided weakness in 1981, blindness in 1982 and confusion.  These findings are concerning for possible demyelinating syndromes.    This is a 63 year old right-handed black female who presents with acute onset of focal neurological symptoms.  Particular she reports significant onset of severe bifrontal headaches, blurring of her vision especially on the right side and some dizziness.  She also reports numbness and weakness involving the left upper extremity and left leg.  She had difficulty to ambulate.  She reports having some dyspnea but no palpitation no chest pain.  Nurse happened to be at her home to monitor her blood pressure and her blood pressure was very high apparently 240/180.  Patient blood pressure has been quite high during admission.  She reports that she is on antihypertensive medications and has been compliant.  She missed 1 day of a couple of her medications.  She tells me she ran out of the labetalol and Norvasc for 1 day but has been taking others including lisinopril.  Apparently her blood pressure typically runs in the 130s to 140s over 70s and 80s.  Patient's work-up is been significant for cocaine in the urine.  Patient reports having significant pain involving the right leg.  The review of systems otherwise negative    1.Acute right-sided symptoms of weakness and numbness with MRI findings showing numerous white matter lesions that is concerning: Differential  diagnosis includes multiple sclerosis, vasculitis, cocaine induced vasculopathy and severe case of hypertensive changes from poorly controlled hypertension. It is also possible that the patient may have a MRI negative infarct. Patient has been started on Plavix. Blood pressure control is warranted. UPDATE -lumbar spine MRI shows marked spondylosis causing moderate to severe central canal stenosis.  She has improved with high-dose steroids.  Continue with the treatment.  No need for weaning course of steroids.  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  We will continue to follow this.  She may need to have a spinal tap to be more certain to rule out neurosarcoidosis but she currently is on Plavix and will not be able to have this done.  We will continue to follow this in the office.  Follow-up in the office with [place] in 1 month.  3. Extensive white matter lesions: Differential as stated above.   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 These findings are concerning for possible demyelinating  syndromes.  6.  Bifrontal headaches:        Objective: Vital signs in last 24 hours: Temp:  [98.3 F (36.8 C)-99.2 F (37.3 C)] 98.9 F (37.2 C) (07/09 1403) Pulse Rate:  [63-67] 64 (07/09 1403) Resp:  [18] 18 (07/09 0643) BP: (148-199)/(58-86) 199/86 (07/09 1403) SpO2:  [96 %-100 %] 100 % (07/09 1403) Weight:  [114.3 kg] 114.3 kg (07/09 0643)  Intake/Output from previous day: 07/08 0701 - 07/09 0700 In: 2619.6 [P.O.:720; I.V.:1841.6; IV Piggyback:58] Out: 3 [Urine:1900] Intake/Output this shift: Total I/O In: 240 [P.O.:240] Out: -  Nutritional status:  Diet Order            Diet Heart Room service appropriate? Yes; Fluid consistency: Thin  Diet effective now               Lab Results: Results for orders  placed or performed during the hospital encounter of 02/08/19 (from the past 48 hour(s))  Basic metabolic panel     Status: Abnormal   Collection Time: 02/10/19  6:07 AM  Result Value Ref Range   Sodium 142 135 - 145 mmol/L   Potassium 4.4 3.5 - 5.1 mmol/L    Comment: DELTA CHECK NOTED   Chloride 113 (H) 98 - 111 mmol/L   CO2 22 22 - 32 mmol/L   Glucose, Bld 67 (L) 70 - 99 mg/dL   BUN 40 (H) 8 - 23 mg/dL   Creatinine, Ser 2.28 (H) 0.44 - 1.00 mg/dL   Calcium 8.3 (L) 8.9 - 10.3 mg/dL   GFR calc non Af Amer 22 (L) >60 mL/min   GFR calc Af Amer 26 (L) >60 mL/min   Anion gap 7 5 - 15    Comment: Performed at [facility], [address], [place], [place] [address]  Magnesium     Status: None   Collection Time: 02/10/19  6:07 AM  Result Value Ref Range   Magnesium 2.4 1.7 - 2.4 mg/dL    Comment: Performed at [facility], [address] [place], [place] [address]  CBC     Status: Abnormal   Collection Time: 02/10/19  6:07 AM  Result Value Ref Range   WBC 7.1 4.0 - 10.5 K/uL   RBC 4.02 3.87 - 5.11 MIL/uL   Hemoglobin 10.1 (L) 12.0 - 15.0 g/dL   HCT 34.1 (L) 36.0 - 46.0 %   MCV 84.8 80.0 - 100.0 fL   MCH 25.1 (L) 26.0 - 34.0 pg   MCHC 29.6 (L) 30.0 - 36.0 g/dL   RDW 16.5 (H) 11.5 - 15.5 %   Platelets 243 150 - 400 K/uL   nRBC 0.0 0.0 - 0.2 %    Comment: Performed at [facility], [address] [place], [place] [address]  CK     Status: Abnormal   Collection Time: 02/10/19  6:07 AM  Result Value Ref Range   Total CK 379 (H) 38 - 234 U/L    Comment: Performed at [facility], [address], [place], [place] [address]  Basic metabolic panel     Status: Abnormal   Collection Time: 02/11/19  5:29 AM  Result Value Ref Range   Sodium 139 135 - 145 mmol/L   Potassium 4.7 3.5 - 5.1 mmol/L   Chloride 108 98 - 111 mmol/L   CO2 20 (L) 22 - 32 mmol/L   Glucose, Bld 130 (H) 70 - 99 mg/dL   BUN 31 (H) 8 - 23 mg/dL   Creatinine, Ser 1.65 (H)  0.44 - 1.00 mg/dL   Calcium 9.0 8.9 - 10.3  mg/dL   GFR calc non Af Amer 33 (L) >60 mL/min   GFR calc Af Amer 38 (L) >60 mL/min   Anion gap 11 5 - 15    Comment: Performed at [facility], [address] [place], [place] [address]  CBC     Status: Abnormal   Collection Time: 02/11/19  5:29 AM  Result Value Ref Range   WBC 5.9 4.0 - 10.5 K/uL   RBC 4.10 3.87 - 5.11 MIL/uL   Hemoglobin 10.4 (L) 12.0 - 15.0 g/dL   HCT 34.0 (L) 36.0 - 46.0 %   MCV 82.9 80.0 - 100.0 fL   MCH 25.4 (L) 26.0 - 34.0 pg   MCHC 30.6 30.0 - 36.0 g/dL   RDW 15.9 (H) 11.5 - 15.5 %   Platelets 241 150 - 400 K/uL   nRBC 0.0 0.0 - 0.2 %    Comment: Performed at [facility], [address], [place], [place] [address]    Lipid Panel No results for input(s): CHOL, TRIG, HDL, CHOLHDL, VLDL, LDLCALC in the last 72 hours.  Studies/Results:  BRAIN MRI: FINDINGS: Brain: The brainstem is normal. 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 Cerebral hemispheres show moderate changes of chronic small vessel disease affecting the thalami, basal ganglia and hemispheric deep white matter. There is a newly seen 8 mm focus of restricted diffusion in the right splenium of the corpus callosum. This could represent a small vessel infarction or a focus of demyelination. No large vessel territory infarction. No evidence of neoplastic mass lesion. After contrast administration, there is a 4 mm focus of enhancement in the right posterior parietal subcortical white matter. 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 No prior contrast enhanced exam.  Vascular: Major vessels at the base of the brain show flow.  Skull and upper cervical spine: Negative  Sinuses/Orbits: Clear/normal  Other: None  IMPRESSION: Newly seen 8 mm focus of restricted diffusion in the right splenium of the corpus callosum. This could be a subacute infarction or a focus of demyelination.  Elsewhere, there chronic small-vessel ischemic changes affecting the left cerebellum, both thalami and basal ganglia and the cerebral hemispheric white matter. It would seem that small-vessel disease is the most likely diagnosis, particularly given the history of hypertension and hyperlipidemia.  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      CERVICAL SPINE MRI FINDINGS: Alignment: Physiologic.  Vertebrae: No fracture, evidence of discitis, or bone lesion.  Cord: Normal signal and morphology.  Posterior Fossa, vertebral arteries, paraspinal tissues: Negative.  Disc levels:  C2-3: Negative.  C3-4: Small central subligamentous disc protrusion slightly asymmetric to the right touching the ventral aspect of the spinal cord but without focal neural impingement.  C4-5: Small broad-based soft disc protrusion without neural impingement.  C5-6: Tiny broad-based disc bulge with no neural impingement. Uncinate spurs slightly narrow the right neural foramen, unchanged.  C6-7: Small broad-based disc bulge without neural impingement. Uncinate spurs narrow the left neural foramen, unchanged.  IMPRESSION: 1. No discrete cervical myelopathy. 2. Multilevel degenerative disc disease with small disc protrusions at C3-4 and C4-5 without focal neural impingement. Three slight right foraminal narrowing at C5-6 and slight left foraminal narrowing at C6-7 due to uncinate spurs, unchanged.  Both scans are reviewed in person.   There is acute signal seen on DWI involving the splenium of the corpus callosum on the right side.  This is a small lesion with no significant notable finding noted on the ADC scan.  There are multiple mostly tiny white matter lesions there seen in the juxtacortical in deep white matter distribution of rather than periventricular.  There is are old  encephalomalacia involving the inferior cerebellum on the left side.  No significant contrast enhancement is appreciated.  There is a tiny area of reduced signal on SWI involving the posterior right frontal area suspicious for remote  Hemorrhage.  The scans are compared to the 1 done a year ago in 2019.  The current scan shows a dramatic reduction in white matter lesions compared to the previous scan.  The MRA done a year ago shows no intracranial occlusive disease.  Cervical spine MRI shows mild multilevel this degeneration but no significant central canal stenosis.         Medications:  Scheduled Meds:  amLODipine  10 mg Oral Daily   cloNIDine  0.2 mg Transdermal Weekly   clopidogrel  75 mg Oral Daily   gabapentin  100 mg Oral TID   heparin  5,000 Units Subcutaneous Q8H   hydrALAZINE  10 mg Oral Q8H   labetalol  300 mg Oral TID   loratadine  10 mg Oral Daily   multivitamin with minerals  1 tablet Oral Daily   traZODone  50 mg Oral QHS   Continuous Infusions:  lactated ringers 125 mL/hr at 02/11/19 0328   methylPREDNISolone (SOLU-MEDROL) injection 1,000 mg (02/10/19 2124)   PRN Meds:.acetaminophen **OR** acetaminophen, hydrALAZINE, HYDROmorphone (DILAUDID) injection, methocarbamol, Muscle Rub     LOS: 2 days   Keldrick [place]plun A. Merlene Laughter, M.D.  Diplomate, Tax adviser of Psychiatry and Neurology ( Neurology).

## 2019-02-11 NOTE — Progress Notes (Signed)
PROGRESS NOTE    Joyce [name]  TKZ:[id number] DOB: 20-Apr-1956 DOA: 02/08/2019 PCP: Lucia Gaskins, MD   Brief Narrative:  Per HPI: Joyce [name] y.o.female,w hypertension, hyperlipidemia, h/o stroke, ckd stage3, depression, chronic right leg pain, apparently presents with c/o muscle spasm, pt notes being in [facility] and doing coccaine. Pt notes that she has been taking Nsaids. Pt denies fever, chills, rash, cough, cp, palp, sob, n/v, abd pain, diarrhea, brbpr, black stool, dysuria, hematuria. Pt has not noticed any change in urine output. Pt does note that she has had poor po intake recently.  Patient was admitted for AKI on CKD stage III along with some mild rhabdomyolysis secondary to cocaine use. She continues to have ongoing, severe muscle spasms to her lower extremities with negative work-up on MRI of the thoracic and lumbar spine.  She was seen by neurology on 7/8 with concerns for cervical myelopathy for which MRI of the cervical spine was performed with no findings of this noted.  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  She continues to have ongoing symptomatology.  PT has evaluated patient with recommendations to go to SNF on discharge.  Assessment & Plan:   Principal Problem:   ARF (acute renal failure) (HCC) Active Problems:   Essential hypertension, benign   Hyperlipidemia   Anemia   ARF on CKD stage 3, appears to intrinsic with Fena 1.2%-improving ESR is 48 Renal ultrasound with no acute findings Hydrate with lr ivat aggressive rate and monitor strict I's and O's, good urine output of 1900 mL noted Recheck BMP in a.m.  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 noted  Continue current steroids for 3 days C-spine MRI reviewed with degenerative changes and no other acute findings PT evaluation reveals need for SNF on discharge  Hypertension-stable STOP Lisinopril / hydrochlorothiazide temporarily Cont Amlodipine '10mg'$  po qday Cont Labetalol '300mg'$  po bid Cont Clonidine patch 0.'2mg'$  Topically q week We will add IV hydralazine as needed for severe elevations as well as hydralazine 3 times daily for now  Hyperlipidemia STOP Simvastatin, due to cpk elevation  Chronic right lower ext pain Cont Gabapentin '300mg'$  po tid => at lower dose of '100mg'$  po tid due to ARF Continue on Robaxin as needed Dilaudid added for severe pain and cramps  Iron deficiency anemia-stable 1 dose of Feraheme given 7/7 No overt bleeding noted   DVT prophylaxis:Heparin Code Status:Full Family Communication:None at bedside Disposition Plan:Continue IV fluid hydration and assess for improvement in kidney injury. Pain management for muscle spasms.   Appreciate further neurology evaluation.  PT recommending SNF on discharge.  Patient requires inpatient stay on account of aggressive IV fluid administration and treatment of AKI.  IV steroids also ongoing.   Consultants:  Neurology  Procedures:  None  Antimicrobials:   None  Subjective: Patient seen and evaluated today with ongoing burning pain to her lower extremities as well as cramping.  She continues to have some weakness and sensory deficits in her upper extremities as well.  Objective: Vitals:   02/10/19 1423 02/10/19 2011 02/10/19 2153 02/11/19 0643  BP: (!) 156/56  (!) 148/58 (!) 187/80  Pulse: 64  67 63  Resp:   18 18  Temp: 98.6 F (37 C)  99.2 F (37.3 C) 98.3 F (36.8 C)  TempSrc: Oral  Oral Oral  SpO2: 99% 96% 99% 100%  Weight:    114.3 kg  Height:        Intake/Output Summary (Last 24 hours) at 02/11/2019 1145 Last data filed at 02/11/2019 0700 Gross per 24 hour  Intake 2379.56 ml   Output 1900 ml  Net 479.56 ml   Filed Weights   02/09/19 0630 02/10/19 0600 02/11/19 0643  Weight: 109.1 kg 112.6 kg 114.3 kg    Examination:  General exam: Appears calm and comfortable  Respiratory system: Clear to auscultation. Respiratory effort normal. Cardiovascular system: S1 & S2 heard, RRR. No JVD, murmurs, rubs, gallops or clicks. No pedal edema. Gastrointestinal system: Abdomen is nondistended, soft and nontender. No organomegaly or masses felt. Normal bowel sounds heard. Central nervous system: Alert and oriented. No focal neurological deficits. Extremities: Symmetric 5 x 5 power. Skin: No rashes, lesions or ulcers Psychiatry: Judgement and insight appear normal. Mood & affect appropriate.     Data Reviewed: I have personally reviewed following labs and imaging studies  CBC: Recent Labs  Lab 02/08/19 1426 02/09/19 0418 02/10/19 0607 02/11/19 0529  WBC 10.6* 9.3 7.1 5.9  NEUTROABS 7.4  --   --   --   HGB 10.6* 9.3* 10.1* 10.4*  HCT 33.7* 30.1*  28.3* 34.1* 34.0*  MCV 80.0 82.9 84.8 82.9  PLT 313 230 243 580   Basic Metabolic Panel: Recent Labs  Lab 02/08/19 1426 02/09/19 0418 02/10/19 0607 02/11/19 0529  NA 140 139 142 139  K 3.5 3.3* 4.4 4.7  CL 108 111 113* 108  CO2 19* 19* 22 20*  GLUCOSE 132* 139* 67* 130*  BUN 47* 44* 40* 31*  CREATININE 3.12* 2.75* 2.28* 1.65*  CALCIUM 8.8* 8.0* 8.3* 9.0  MG  --   --  2.4  --    GFR: Estimated Creatinine Clearance: 45.6 mL/min (A) (by C-G formula based on SCr of 1.65 mg/dL (H)). Liver Function Tests: Recent Labs  Lab 02/08/19 1426 02/09/19 0418  AST 31 24  ALT 16 14  ALKPHOS 85 82  BILITOT 0.4 0.2*  PROT 7.0 5.7*  ALBUMIN 3.7 3.1*   No results for input(s): LIPASE, AMYLASE in the last 168 hours. No results for input(s): AMMONIA in the last 168 hours. Coagulation Profile: No results for input(s): INR, PROTIME in the last 168 hours. Cardiac Enzymes: Recent Labs  Lab 02/08/19 1508 02/09/19  0418 02/10/19 0607  CKTOTAL 763* 463* 379*  CKMB  --  10.1*  --    BNP (last 3 results) No results for input(s): PROBNP in the last 8760 hours. HbA1C: No results for input(s): HGBA1C in the last 72 hours. CBG: No results for input(s): GLUCAP in the last 168 hours. Lipid Profile: No results for input(s): CHOL, HDL, LDLCALC, TRIG, CHOLHDL, LDLDIRECT in the last 72 hours. Thyroid Function Tests: Recent Labs    02/09/19 0418  TSH 4.001   Anemia Panel: Recent Labs    02/09/19 0418  VITAMINB12 309  FERRITIN 35  TIBC 400  IRON 26*   Sepsis Labs: No results for input(s): PROCALCITON, LATICACIDVEN in the last 168 hours.  Recent Results (from the past 240 hour(s))  SARS Coronavirus 2 (CEPHEID - Performed in [place] hospital lab), Hosp Order     Status: None   Collection Time: 02/08/19  8:35 PM   Specimen: Nasopharyngeal Swab  Result Value Ref Range Status   SARS Coronavirus 2 NEGATIVE NEGATIVE Final    Comment: (NOTE) If result is NEGATIVE SARS-CoV-2 target  nucleic acids are NOT DETECTED. The SARS-CoV-2 RNA is generally detectable in upper and lower  respiratory specimens during the acute phase of infection. The lowest  concentration of SARS-CoV-2 viral copies this assay can detect is 250  copies / mL. A negative result does not preclude SARS-CoV-2 infection  and should not be used as the sole basis for treatment or other  patient management decisions.  A negative result may occur with  improper specimen collection / handling, submission of specimen other  than nasopharyngeal swab, presence of viral mutation(s) within the  areas targeted by this assay, and inadequate number of viral copies  (<250 copies / mL). A negative result must be combined with clinical  observations, patient history, and epidemiological information. If result is POSITIVE SARS-CoV-2 target nucleic acids are DETECTED. The SARS-CoV-2 RNA is generally detectable in upper and lower  respiratory  specimens dur ing the acute phase of infection.  Positive  results are indicative of active infection with SARS-CoV-2.  Clinical  correlation with patient history and other diagnostic information is  necessary to determine patient infection status.  Positive results do  not rule out bacterial infection or co-infection with other viruses. If result is PRESUMPTIVE POSTIVE SARS-CoV-2 nucleic acids MAY BE PRESENT.   A presumptive positive result was obtained on the submitted specimen  and confirmed on repeat testing.  While 2019 novel coronavirus  (SARS-CoV-2) nucleic acids may be present in the submitted sample  additional confirmatory testing may be necessary for epidemiological  and / or clinical management purposes  to differentiate between  SARS-CoV-2 and other Sarbecovirus currently known to infect humans.  If clinically indicated additional testing with an alternate test  methodology [phone]) is advised. The SARS-CoV-2 RNA is generally  detectable in upper and lower respiratory sp ecimens during the acute  phase of infection. The expected result is Negative. Fact Sheet for Patients:  StrictlyIdeas.no Fact Sheet for Healthcare Providers: BankingDealers.co.za This test is not yet approved or cleared by the [place] FDA and has been authorized for detection and/or diagnosis of SARS-CoV-2 by FDA under an Emergency Use Authorization (EUA).  This EUA will remain in effect (meaning this test can be used) for the duration of the COVID-19 declaration under Section 564(b)(1) of the Act, 21 U.S.C. section 360bbb-3(b)(1), unless the authorization is terminated or revoked sooner. Performed at [facility], [address], [place], [place] [address]          Radiology Studies: Mr Cervical Spine Wo Contrast  Result Date: 02/11/2019 CLINICAL DATA:  Episodic spasms of the right lower extremity. Severe weakness of the right lower extremity.  Fecal incontinence. Anesthesia of the right foot and distal leg. Symptomatology consistent with cervical myelopathy. EXAM: MRI CERVICAL SPINE WITHOUT CONTRAST TECHNIQUE: Multiplanar, multisequence MR imaging of the cervical spine was performed. No intravenous contrast was administered. COMPARISON:  CT scan of the cervical spine dated 03/05/2018 and cervical MRI dated 01/23/2018 FINDINGS: Alignment: Physiologic. Vertebrae: No fracture, evidence of discitis, or bone lesion. Cord: Normal signal and morphology. Posterior Fossa, vertebral arteries, paraspinal tissues: Negative. Disc levels: C2-3: Negative. C3-4: Small central subligamentous disc protrusion slightly asymmetric to the right touching the ventral aspect of the spinal cord but without focal neural impingement. C4-5: Small broad-based soft disc protrusion without neural impingement. C5-6: Tiny broad-based disc bulge with no neural impingement. Uncinate spurs slightly narrow the right neural foramen, unchanged. C6-7: Small broad-based disc bulge without neural impingement. Uncinate spurs narrow the left neural foramen, unchanged. IMPRESSION: 1. No discrete  cervical myelopathy. 2. Multilevel degenerative disc disease with small disc protrusions at C3-4 and C4-5 without focal neural impingement. Three slight right foraminal narrowing at C5-6 and slight left foraminal narrowing at C6-7 due to uncinate spurs, unchanged. Electronically Signed   By: Lorriane Shire M.D.   On: 02/11/2019 09:45   US Renal  Result Date: 02/09/2019 CLINICAL DATA:  Acute renal failure EXAM: RENAL / URINARY TRACT ULTRASOUND COMPLETE COMPARISON:  None. FINDINGS: Right Kidney: Renal measurements: 9.6 x 4.5 x 5 cm = volume: 113 mL . Echogenicity within normal limits. No mass or hydronephrosis visualized. Left Kidney: Renal measurements: 9.9 x 4.2 x 4.5 cm = volume: 98 mL. Echogenicity within normal limits. No mass or hydronephrosis visualized. Bladder: Incompletely distended and not  visualized. IMPRESSION: 1. No hydronephrosis or other reversible finding. 2. Symmetric renal size. Electronically Signed   By: Monte Fantasia M.D.   On: 02/09/2019 13:24        Scheduled Meds: . amLODipine  10 mg Oral Daily  . cloNIDine  0.2 mg Transdermal Weekly  . gabapentin  100 mg Oral TID  . heparin  5,000 Units Subcutaneous Q8H  . labetalol  300 mg Oral TID  . loratadine  10 mg Oral Daily  . multivitamin with minerals  1 tablet Oral Daily  . traZODone  50 mg Oral QHS   Continuous Infusions: . lactated ringers 125 mL/hr at 02/11/19 0328  . methylPREDNISolone (SOLU-MEDROL) injection 1,000 mg (02/10/19 2124)     LOS: 2 days    Time spent: 30 minutes    Halea Lieb Darleen Crocker, DO [facility] Pager [phone]  If 7PM-7AM, please contact night-coverage [email or web address] Password [email or web address] 02/11/2019, 11:45 AM

## 2019-02-12 ENCOUNTER — Inpatient Hospital Stay (HOSPITAL_COMMUNITY): Payer: Medicare Other

## 2019-02-12 LAB — CSF CELL COUNT WITH DIFFERENTIAL
RBC Count, CSF: 3 /mm3 — ABNORMAL HIGH
Tube #: 4
WBC, CSF: 0 /mm3 (ref 0–5)

## 2019-02-12 LAB — CBC
HCT: 30.6 % — ABNORMAL LOW (ref 36.0–46.0)
Hemoglobin: 9.6 g/dL — ABNORMAL LOW (ref 12.0–15.0)
MCH: 25.5 pg — ABNORMAL LOW (ref 26.0–34.0)
MCHC: 31.4 g/dL (ref 30.0–36.0)
MCV: 81.2 fL (ref 80.0–100.0)
Platelets: 239 10*3/uL (ref 150–400)
RBC: 3.77 MIL/uL — ABNORMAL LOW (ref 3.87–5.11)
RDW: 16.1 % — ABNORMAL HIGH (ref 11.5–15.5)
WBC: 13.9 10*3/uL — ABNORMAL HIGH (ref 4.0–10.5)
nRBC: 0 % (ref 0.0–0.2)

## 2019-02-12 LAB — BASIC METABOLIC PANEL
Anion gap: 7 (ref 5–15)
BUN: 31 mg/dL — ABNORMAL HIGH (ref 8–23)
CO2: 23 mmol/L (ref 22–32)
Calcium: 8.8 mg/dL — ABNORMAL LOW (ref 8.9–10.3)
Chloride: 107 mmol/L (ref 98–111)
Creatinine, Ser: 1.56 mg/dL — ABNORMAL HIGH (ref 0.44–1.00)
GFR calc Af Amer: 41 mL/min — ABNORMAL LOW (ref >60)
GFR calc non Af Amer: 35 mL/min — ABNORMAL LOW (ref >60)
Glucose, Bld: 141 mg/dL — ABNORMAL HIGH (ref 70–99)
Potassium: 4.6 mmol/L (ref 3.5–5.1)
Sodium: 137 mmol/L (ref 135–145)

## 2019-02-12 LAB — PROTEIN AND GLUCOSE, CSF
Glucose, CSF: 74 mg/dL — ABNORMAL HIGH (ref 40–70)
Total  Protein, CSF: 32 mg/dL (ref 15–45)

## 2019-02-12 MED ORDER — HYDRALAZINE HCL 25 MG PO TABS
25.0000 mg | ORAL_TABLET | Freq: Three times a day (TID) | ORAL | Status: DC
  Administered 2019-02-12 – 2019-02-17 (×16): 25 mg via ORAL
  Filled 2019-02-12 (×15): qty 1

## 2019-02-12 MED ORDER — BUTALBITAL-APAP-CAFFEINE 50-325-40 MG PO TABS
2.0000 | ORAL_TABLET | ORAL | Status: DC | PRN
  Administered 2019-02-12: 2 via ORAL
  Filled 2019-02-12 (×2): qty 2

## 2019-02-12 MED ORDER — CLOPIDOGREL BISULFATE 75 MG PO TABS
75.0000 mg | ORAL_TABLET | Freq: Every day | ORAL | Status: DC
  Administered 2019-02-13 – 2019-02-17 (×5): 75 mg via ORAL
  Filled 2019-02-12 (×5): qty 1

## 2019-02-12 MED ORDER — GABAPENTIN 300 MG PO CAPS
300.0000 mg | ORAL_CAPSULE | Freq: Three times a day (TID) | ORAL | Status: DC
  Administered 2019-02-12 – 2019-02-17 (×16): 300 mg via ORAL
  Filled 2019-02-12 (×16): qty 1

## 2019-02-12 MED ORDER — POVIDONE-IODINE 10 % EX SOLN
CUTANEOUS | Status: AC
  Filled 2019-02-12: qty 15

## 2019-02-12 NOTE — Procedures (Signed)
Preprocedure Dx: MS Postprocedure Dx: MS Procedure:  Fluoroscopically guided lumbar puncture Radiologist:  Thornton Papas Anesthesia:  5 ml of 1% lidocaine Specimen:  11 ml CSF, clear & colorless EBL:   < 1 ml Opening pressure: 25 cm [id number] Complications: None

## 2019-02-12 NOTE — Progress Notes (Signed)
PROGRESS NOTE    Joyce [name]  OEU:[id number] DOB: [birth year] DOA: 02/08/2019 PCP: Lucia Gaskins, MD   Brief Narrative:  Per HPI: Joyce [name] y.o.female,w hypertension, hyperlipidemia, h/o stroke, ckd stage3, depression, chronic right leg pain, apparently presents with c/o muscle spasm, pt notes being in [facility] and doing coccaine. Pt notes that she has been taking Nsaids. Pt denies fever, chills, rash, cough, cp, palp, sob, n/v, abd pain, diarrhea, brbpr, black stool, dysuria, hematuria. Pt has not noticed any change in urine output. Pt does note that she has had poor po intake recently.  Patient was admitted for AKI on CKD stage III along with some mild rhabdomyolysis secondary to cocaine use. She continues to have ongoing, severe muscle spasms to her lower extremities with negative work-up on MRI of the thoracic and lumbar spine.  She was seen by neurology on 7/8 with concerns for cervical myelopathy for which MRI of the cervical spine was performed with no findings of this noted.  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  Recommendations are for Plavix to be started after spinal tap that will be performed on 7/10.  PT has recommended SNF on discharge.  Kidney function continues to improve on IV fluid.   Assessment & Plan:   Principal Problem:   ARF (acute renal failure) (HCC) Active Problems:   Essential hypertension, benign   Hyperlipidemia   Anemia  ARF on CKD stage 3, appears to intrinsic with Fena 1.2%-improving ESR is 48 Renal ultrasound with no acute findings Hydrate withlrivataggressive rateand monitor strict I's and O's, good urine output of 1200 mL noted RecheckBMP in a.m.  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  Hyperlipidemia STOP Simvastatin, due to cpk elevation  Chronic right lower ext pain Gabapentin increased to 300m73m 3 times daily due to improvement in AKI and hopefully to help symptoms Continue on Robaxin as needed Dilaudid added for severe pain and cramps  Iron deficiency anemia-stable 1 dose of Feraheme given 7/7 No overt bleeding noted   DVT prophylaxis:Heparin Code Status:Full Family Communication:None at bedside Disposition Plan:Continue IV fluid hydration and assess for improvement in kidney injury. Pain management for muscle spasms.  Appreciate further neurology evaluation.  PT recommending SNF on discharge.  Spinal tap ordered for today.  Patient requires inpatient stay on account of aggressive IV fluid administration and treatment of AKI.  IV steroids also ongoing.   Consultants:  Neurology  Procedures:  None  Antimicrobials:   None  Subjective: Patient seen and evaluated today with ongoing lower extremity cramping and spasms that are controlled with pain  medications.  She continues to have good urine output.  She is anxious about her spinal tap that is  upcoming.  Objective: Vitals:   02/11/19 2135 02/12/19 0458 02/12/19 0525 02/12/19 1045  BP: (!) 194/72  (!) 170/66 (!) 205/81  Pulse: 70  (!) 57 64  Resp: _0 Temp: 98.4 F (36.9 C)  97.6 F (36.4 C) 98.4 F (36.9 C)  TempSrc: Oral  Oral Oral  SpO2: 100%  100% 100%  Weight:  115.8 kg    Height:        Intake/Output Summary (Last 24 hours) at 02/12/2019 1107 Last data filed at 02/12/2019 0900 Gross per 24 hour  Intake 720 ml  Output 1200 ml  Net -480 ml   Filed Weights   02/10/19 0600 02/11/19 0643 02/12/19 0458  Weight: 112.6 kg 114.3 kg 115.8 kg    Examination:  General exam: Appears calm and comfortable  Respiratory system: Clear to auscultation. Respiratory effort normal. Cardiovascular system: S1 & S2 heard, RRR. No JVD, murmurs, rubs, gallops or clicks. No pedal edema. Gastrointestinal system: Abdomen is nondistended, soft and nontender. No organomegaly or masses felt. Normal bowel sounds heard. Central nervous system: Alert and oriented. No focal neurological deficits. Extremities: Symmetric 5 x 5 power. Skin: No rashes, lesions or ulcers Psychiatry: Judgement and insight appear normal. Mood & affect appropriate.     Data Reviewed: I have personally reviewed following labs and imaging studies  CBC: Recent Labs  Lab 02/08/19 1426 02/09/19 0418 02/10/19 0607 02/11/19 0529 02/12/19 0508  WBC 10.6* 9.3 7.1 5.9 13.9*  NEUTROABS 7.4  --   --   --   --   HGB 10.6* 9.3* 10.1* 10.4* 9.6*  HCT 33.7* 30.1*   28.3* 34.1* 34.0* 30.6*  MCV 80.0 82.9 84.8 82.9 81.2  PLT 313 230 243 241 675   Basic Metabolic Panel: Recent Labs  Lab 02/08/19 1426 02/09/19 0418 02/10/19 0607 02/11/19 0529 02/12/19 0508  NA 140 139 142 139 137  K 3.5 3.3* 4.4 4.7 4.6  CL 108 111 113* 108 107  CO2 19* 19* 22 20* 23  GLUCOSE 132* 139* 67* 130* 141*  BUN 47* 44* 40* 31* 31*  CREATININE 3.12* 2.75* 2.28* 1.65* 1.56*  CALCIUM 8.8* 8.0* 8.3* 9.0 8.8*  MG  --   --  2.4   --   --    GFR: Estimated Creatinine Clearance: 48.5 mL/min (A) (by C-G formula based on SCr of 1.56 mg/dL (H)). Liver Function Tests: Recent Labs  Lab 02/08/19 1426 02/09/19 0418  AST 31 24  ALT 16 14  ALKPHOS 85 82  BILITOT 0.4 0.2*  PROT 7.0 5.7*  ALBUMIN 3.7 3.1*   No results for input(s): LIPASE, AMYLASE in the last 168 hours. No results for input(s): AMMONIA in the last 168 hours. Coagulation Profile: No results for input(s): INR, PROTIME in the last 168 hours. Cardiac Enzymes: Recent Labs  Lab 02/08/19 1508 02/09/19 0418 02/10/19 0607  CKTOTAL 763* 463* 379*  CKMB  --  10.1*  --    BNP (last 3 results) No results for input(s): PROBNP in the last 8760 hours. HbA1C: No results for input(s): HGBA1C in the last 72 hours. CBG: No results for input(s): GLUCAP in the last 168 hours. Lipid Profile: No results for input(s): CHOL, HDL, LDLCALC, TRIG, CHOLHDL, LDLDIRECT in the last 72 hours. Thyroid Function Tests: No results for input(s): TSH, T4TOTAL, FREET4, T3FREE, THYROIDAB in the last 72 hours. Anemia Panel:  No results for input(s): VITAMINB12, FOLATE, FERRITIN, TIBC, IRON, RETICCTPCT in the last 72 hours. Sepsis Labs: No results for input(s): PROCALCITON, LATICACIDVEN in the last 168 hours.  Recent Results (from the past 240 hour(s))  SARS Coronavirus 2 (CEPHEID - Performed in [place] hospital lab), Hosp Order     Status: None   Collection Time: 02/08/19  8:35 PM   Specimen: Nasopharyngeal Swab  Result Value Ref Range Status   SARS Coronavirus 2 NEGATIVE NEGATIVE Final    Comment: (NOTE) If result is NEGATIVE SARS-CoV-2 target nucleic acids are NOT DETECTED. The SARS-CoV-2 RNA is generally detectable in upper and lower  respiratory specimens during the acute phase of infection. The lowest  concentration of SARS-CoV-2 viral copies this assay can detect is 250  copies / mL. A negative result does not preclude SARS-CoV-2 infection  and should not be used  as the sole basis for treatment or other  patient management decisions.  A negative result may occur with  improper specimen collection / handling, submission of specimen other  than nasopharyngeal swab, presence of viral mutation(s) within the  areas targeted by this assay, and inadequate number of viral copies  (<250 copies / mL). A negative result must be combined with clinical  observations, patient history, and epidemiological information. If result is POSITIVE SARS-CoV-2 target nucleic acids are DETECTED. The SARS-CoV-2 RNA is generally detectable in upper and lower  respiratory specimens dur ing the acute phase of infection.  Positive  results are indicative of active infection with SARS-CoV-2.  Clinical  correlation with patient history and other diagnostic information is  necessary to determine patient infection status.  Positive results do  not rule out bacterial infection or co-infection with other viruses. If result is PRESUMPTIVE POSTIVE SARS-CoV-2 nucleic acids MAY BE PRESENT.   A presumptive positive result was obtained on the submitted specimen  and confirmed on repeat testing.  While 2019 novel coronavirus  (SARS-CoV-2) nucleic acids may be present in the submitted sample  additional confirmatory testing may be necessary for epidemiological  and / or clinical management purposes  to differentiate between  SARS-CoV-2 and other Sarbecovirus currently known to infect humans.  If clinically indicated additional testing with an alternate test  methodology [phone]) is advised. The SARS-CoV-2 RNA is generally  detectable in upper and lower respiratory sp ecimens during the acute  phase of infection. The expected result is Negative. Fact Sheet for Patients:  StrictlyIdeas.no Fact Sheet for Healthcare Providers: BankingDealers.co.za This test is not yet approved or cleared by the [place] FDA and has been authorized for  detection and/or diagnosis of SARS-CoV-2 by FDA under an Emergency Use Authorization (EUA).  This EUA will remain in effect (meaning this test can be used) for the duration of the COVID-19 declaration under Section 564(b)(1) of the Act, 21 U.S.C. section 360bbb-3(b)(1), unless the authorization is terminated or revoked sooner. Performed at [facility], [address] [place], [place] [address]          Radiology Studies: Mr Brain Wo Contrast  Result Date: 02/11/2019 CLINICAL DATA:  Headache, weakness and difficulty walking over the last 3 days. EXAM: MRI HEAD WITHOUT CONTRAST TECHNIQUE: Multiplanar, multiecho pulse sequences of the brain and surrounding structures were obtained without intravenous contrast. COMPARISON:  01/22/2018 FINDINGS: Brain: The brainstem is normal. 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 Cerebral hemispheres show moderate changes of chronic small vessel disease affecting the thalami, basal ganglia and hemispheric deep white matter. There  is a newly seen 8 mm focus of restricted diffusion in the right splenium of the corpus callosum. This could represent a small vessel infarction or a focus of demyelination. No large vessel territory infarction. No evidence of neoplastic mass lesion. After contrast administration, there is a 4 mm focus of enhancement in the right posterior parietal subcortical white matter. 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 No prior contrast enhanced exam. Vascular: Major vessels at the base of the brain show flow. Skull and upper cervical spine: Negative Sinuses/Orbits: Clear/normal Other: None IMPRESSION: Newly seen 8 mm focus of restricted diffusion in the right splenium of the corpus callosum. This could be a subacute infarction or a focus of demyelination. Elsewhere, there chronic small-vessel ischemic changes affecting the left cerebellum,  both thalami and basal ganglia and the cerebral hemispheric white matter. It would seem that small-vessel disease is the most likely diagnosis, particularly given the history of hypertension and hyperlipidemia. 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 Electronically Signed   By: Nelson Chimes M.D.   On: 02/11/2019 13:47   Mr Cervical Spine Wo Contrast  Result Date: 02/11/2019 CLINICAL DATA:  Episodic spasms of the right lower extremity. Severe weakness of the right lower extremity. Fecal incontinence. Anesthesia of the right foot and distal leg. Symptomatology consistent with cervical myelopathy. EXAM: MRI CERVICAL SPINE WITHOUT CONTRAST TECHNIQUE: Multiplanar, multisequence MR imaging of the cervical spine was performed. No intravenous contrast was administered. COMPARISON:  CT scan of the cervical spine dated 03/05/2018 and cervical MRI dated 01/23/2018 FINDINGS: Alignment: Physiologic. Vertebrae: No fracture, evidence of discitis, or bone lesion. Cord: Normal signal and morphology. Posterior Fossa, vertebral arteries, paraspinal tissues: Negative. Disc levels: C2-3: Negative. C3-4: Small central subligamentous disc protrusion slightly asymmetric to the right touching the ventral aspect of the spinal cord but without focal neural impingement. C4-5: Small broad-based soft disc protrusion without neural impingement. C5-6: Tiny broad-based disc bulge with no neural impingement. Uncinate spurs slightly narrow the right neural foramen, unchanged. C6-7: Small broad-based disc bulge without neural impingement. Uncinate spurs narrow the left neural foramen, unchanged. IMPRESSION: 1. No discrete cervical myelopathy. 2. Multilevel degenerative disc disease with small disc protrusions at C3-4 and C4-5 without focal neural impingement. Three slight right foraminal narrowing at C5-6 and slight left foraminal narrowing at C6-7  due to uncinate spurs, unchanged. Electronically Signed   By: Lorriane Shire M.D.   On: 02/11/2019 09:45        Scheduled Meds:  povidone-iodine       amLODipine  10 mg Oral Daily   cloNIDine  0.2 mg Transdermal Weekly   gabapentin  100 mg Oral TID   gabapentin  300 mg Oral TID   hydrALAZINE  25 mg Oral Q8H   labetalol  300 mg Oral TID   loratadine  10 mg Oral Daily   multivitamin with minerals  1 tablet Oral Daily   traZODone  50 mg Oral QHS   Continuous Infusions:  lactated ringers 125 mL/hr at 02/12/19 0251   methylPREDNISolone (SOLU-MEDROL) injection 1,000 mg (02/11/19 2345)     LOS: 3 days    Time spent: 30 minutes    Ketzia Guzek Darleen Crocker, DO [facility] Pager [phone]  If 7PM-7AM, please contact night-coverage [email or web address] Password [email or web address] 02/12/2019, 11:07 AM

## 2019-02-12 NOTE — Progress Notes (Addendum)
[address] A. Merlene Laughter, MD     [email or web address]          Joyce [name] is an 63 y.o. female.   Assessment/Plan: 1.  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  This is the most likely etiology although the presentation is quite unusual.  Suspect this is the cause of her symptomatology at least in part.  There also appears to be severe central canal stenosis at L2-L3 which also is not potential etiology.  She is status post her last day of high-dose Solu-Medrol today.  The patient's brain MRI finding shows significant white matter disease although this has improved dramatically since her scan a year ago.  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  We also entertained the possibility of cocaine vasculopathy.  Given the acute infarct seen on MRI today, the patient will be started on Plavix.  The patient will be restarted on DVT prophylaxis.  Heparin can be started tomorrow or we can start leg compressions compressions.  Echo and carotid will be obtained.  Thursday will be given to help with post LP headaches.  The initial spinal fluid analysis results are negative.  We will continue to follow up all results.    The patient reports that overall things are the same.  She still is continuing to have all her symptoms including bowel incontinence.  The right leg weakness is unchanged.  She is status post spinal fluid assessment.  She does report having some mild headache and neck pain after the procedure especially when she sits up.  She continues to have spasms involving the right leg mostly although this seems to have improved.  She also has some involving the left lower extremity.  GENERAL: This very pleasant overweight female who is in  no acute distress.  HEENT: Neck is supple and no trauma appreciated.  There is full range of motion of the neck.  ABDOMEN: soft  EXTREMITIES: No edema; there is slight range of motion limitation of the left shoulder due to pain.  BACK: the alignment is normal.  There is no significant pain on palpation of the lumbar thoracic spine.  SKIN: Normal by inspection.    MENTAL STATUS: Alert and oriented. Speech, language and cognition are generally intact. Judgment and insight normal.   CRANIAL NERVES: Pupils are equal, round and reactive to light and accomodation; extra ocular movements are full, there is no significant nystagmus; visual fields are full; upper and lower facial muscles are normal in strength and symmetric, there is no flattening of the nasolabial folds; tongue is midline; uvula is midline; shoulder elevation is normal.  MOTOR: Left deltoid is 4-/5.  The other distal muscles of the left upper extremity are normal.  Bulk and tone are normal of the left upper extremity.  The right upper extremity shows normal tone and bulk.  The strength is graded as 4+/5 in particular the triceps and deltoids.  Left hip flexion is 4+ and dorsiflexion 5.  Right hip flexion is 2/5 and dorsiflexion 3.  COORDINATION: Left finger to nose is normal, right finger to nose is normal, No rest tremor; no intention tremor; no postural tremor; no bradykinesia.  REFLEXES: Deep tendon reflexes are normal involving the right upper extremity.  The radial response is brisk.  The brachialis and biceps are normal.  The left knee jerk is somewhat brisk.  The right knee response is absent status post total knee arthroplasty. Plantar reflexes are flexor bilaterally.   SENSATION: There is anesthesia of the right foot to just above the ankle.  There is reduced sensation to pinprick and light temperature involving the right trunk region with a sensory level of about T5.  The left side is  normal..          PRIOR NOTE 1.  Acute right-sided symptoms of weakness and numbness with MRI findings showing numerous white matter lesions that is concerning: Differential diagnosis includes multiple sclerosis, vasculitis, cocaine induced vasculopathy and severe case of hypertensive changes from poorly controlled hypertension.  It is also possible that the patient may have a MRI negative infarct.  Patient has been started on Plavix.  Blood pressure control is warranted.  2.  Extensive white matter lesions: Differential as stated above.  Cervical spine MRI is recommended.  Additional labs to be obtained -RPR, C-reactive protein and ANA.  3.  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  Prior neurological events of right-sided weakness in 1981, blindness in 1982 and confusion.  These findings are concerning for possible demyelinating syndromes.    This is a 63 year old right-handed black female who presents with acute onset of focal neurological symptoms.  Particular she reports significant onset of severe bifrontal headaches, blurring of her vision especially on the right side and some dizziness.  She also reports numbness and weakness involving the left upper extremity and left leg.  She had difficulty to ambulate.  She reports having some dyspnea but no palpitation no chest pain.  Nurse happened to be at her home to monitor her blood pressure and her blood pressure was very high apparently 240/180.  Patient blood pressure has been quite high during admission.  She reports that she is on antihypertensive medications and has been compliant.  She missed 1 day of a couple of her medications.  She tells me she ran out of the labetalol and Norvasc for 1 day but has been taking others including lisinopril.  Apparently her blood pressure typically runs in the 130s to 140s over 70s and 80s.  Patient's work-up is been significant for cocaine in the urine.  Patient reports  having significant pain involving the right leg.  The review of systems otherwise negative    1.Acute right-sided symptoms of weakness and numbness with MRI findings showing numerous white matter lesions that is concerning: Differential diagnosis includes multiple sclerosis, vasculitis, cocaine induced vasculopathy and severe case of hypertensive changes from poorly controlled hypertension. It is also possible that the patient may have a MRI negative infarct. Patient has been started on Plavix. Blood pressure control is warranted. UPDATE -lumbar spine MRI shows marked spondylosis causing moderate to severe central canal stenosis.  She has improved with high-dose steroids.  Continue with the treatment.  No need for weaning course of steroids.  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  We will continue to follow this.  She may need to have a spinal tap to be more certain to rule out neurosarcoidosis but she currently is on Plavix and will not be able to have this done.  We will continue to follow this in the office.  Follow-up in the office with [place] in 1 month.  3. Extensive white matter lesions:  Differential as stated above.   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 These findings are concerning for possible demyelinating syndromes.  6.  Bifrontal headaches:        Objective: Vital signs in last 24 hours: Temp:  [97.6 F (36.4 C)-98.4 F (36.9 C)] 98.4 F (36.9 C) (07/10 1045) Pulse Rate:  [57-70] 65 (07/10 1145) Resp:  [16-18] 18 (07/10 1145) BP: (169-205)/(63-81) 169/63 (07/10 1145) SpO2:  [97 %-100 %] 99 % (07/10 1145) Weight:  [115.8 kg] 115.8 kg (07/10 0458)  Intake/Output from previous day: 07/09 0701 - 07/10 0700 In: 720 [P.O.:720] Out: 1200 [Urine:1200] Intake/Output this shift: No intake/output  data recorded. Nutritional status:  Diet Order            Diet Heart Room service appropriate? Yes; Fluid consistency: Thin  Diet effective now               Lab Results: Results for orders placed or performed during the hospital encounter of 02/08/19 (from the past 48 hour(s))  Basic metabolic panel     Status: Abnormal   Collection Time: 02/11/19  5:29 AM  Result Value Ref Range   Sodium 139 135 - 145 mmol/L   Potassium 4.7 3.5 - 5.1 mmol/L   Chloride 108 98 - 111 mmol/L   CO2 20 (L) 22 - 32 mmol/L   Glucose, Bld 130 (H) 70 - 99 mg/dL   BUN 31 (H) 8 - 23 mg/dL   Creatinine, Ser 1.65 (H) 0.44 - 1.00 mg/dL   Calcium 9.0 8.9 - 10.3 mg/dL   GFR calc non Af Amer 33 (L) >60 mL/min   GFR calc Af Amer 38 (L) >60 mL/min   Anion gap 11 5 - 15    Comment: Performed at [facility], [address], [place], [place] [address]  CBC     Status: Abnormal   Collection Time: 02/11/19  5:29 AM  Result Value Ref Range   WBC 5.9 4.0 - 10.5 K/uL   RBC 4.10 3.87 - 5.11 MIL/uL   Hemoglobin 10.4 (L) 12.0 - 15.0 g/dL   HCT 34.0 (L) 36.0 - 46.0 %   MCV 82.9 80.0 - 100.0 fL   MCH 25.4 (L) 26.0 - 34.0 pg   MCHC 30.6 30.0 - 36.0 g/dL   RDW 15.9 (H) 11.5 - 15.5 %   Platelets 241 150 - 400 K/uL   nRBC 0.0 0.0 - 0.2 %    Comment: Performed at [facility], [address] [place], [place] [address]  CSF cell count with differential     Status: Abnormal   Collection Time: 02/11/19 11:30 AM  Result Value Ref Range   Tube # 4    Color, CSF COLORLESS COLORLESS   Appearance, CSF CLEAR CLEAR   Supernatant COLORLESS    RBC Count, CSF 3 (H) 0 /cu mm   WBC, CSF 0 0 - 5 /cu mm   Segmented Neutrophils-CSF TOO FEW TO COUNT, SMEAR AVAILABLE FOR REVIEW 0 - 6 %    Comment: Performed at [facility], [address] [place], [place] [address]  Basic metabolic panel     Status: Abnormal   Collection Time: 02/12/19  5:08 AM  Result Value Ref Range   Sodium 137 135 - 145 mmol/L   Potassium 4.6 3.5 - 5.1 mmol/L    Chloride 107 98 - 111 mmol/L   CO2 23 22 -  32 mmol/L   Glucose, Bld 141 (H) 70 - 99 mg/dL   BUN 31 (H) 8 - 23 mg/dL   Creatinine, Ser 1.56 (H) 0.44 - 1.00 mg/dL   Calcium 8.8 (L) 8.9 - 10.3 mg/dL   GFR calc non Af Amer 35 (L) >60 mL/min   GFR calc Af Amer 41 (L) >60 mL/min   Anion gap 7 5 - 15    Comment: Performed at [facility], [address], [place], [place] [address]  CBC     Status: Abnormal   Collection Time: 02/12/19  5:08 AM  Result Value Ref Range   WBC 13.9 (H) 4.0 - 10.5 K/uL   RBC 3.77 (L) 3.87 - 5.11 MIL/uL   Hemoglobin 9.6 (L) 12.0 - 15.0 g/dL   HCT 30.6 (L) 36.0 - 46.0 %   MCV 81.2 80.0 - 100.0 fL   MCH 25.5 (L) 26.0 - 34.0 pg   MCHC 31.4 30.0 - 36.0 g/dL   RDW 16.1 (H) 11.5 - 15.5 %   Platelets 239 150 - 400 K/uL   nRBC 0.0 0.0 - 0.2 %    Comment: Performed at [facility], [address], [place], [place] [address]  Protein and glucose, CSF     Status: Abnormal   Collection Time: 02/12/19 11:30 AM  Result Value Ref Range   Glucose, CSF 74 (H) 40 - 70 mg/dL   Total  Protein, CSF 32 15 - 45 mg/dL    Comment: Performed at [facility], [address] [place], [place] [address]    Lipid Panel No results for input(s): CHOL, TRIG, HDL, CHOLHDL, VLDL, LDLCALC in the last 72 hours.  Studies/Results:  BRAIN MRI: FINDINGS: Brain: The brainstem is normal. 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 Cerebral hemispheres show moderate changes of chronic small vessel disease affecting the thalami, basal ganglia and hemispheric deep white matter. There is a newly seen 8 mm focus of restricted diffusion in the right splenium of the corpus callosum. This could represent a small vessel infarction or a focus of demyelination. No large vessel territory infarction. No evidence of neoplastic mass lesion. After contrast administration, there is a 4 mm focus of enhancement in the right posterior parietal subcortical white matter. This does not appear  to correspond with any other signal abnormality and this may represent a developmental venous anomaly. No prior contrast enhanced exam.  Vascular: Major vessels at the base of the brain show flow.  Skull and upper cervical spine: Negative  Sinuses/Orbits: Clear/normal  Other: None  IMPRESSION: Newly seen 8 mm focus of restricted diffusion in the right splenium of the corpus callosum. This could be a subacute infarction or a focus of demyelination. Elsewhere, there chronic small-vessel ischemic changes affecting the left cerebellum, both thalami and basal ganglia and the cerebral hemispheric white matter. It would seem that small-vessel disease is the most likely diagnosis, particularly given the history of hypertension and hyperlipidemia.  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      CERVICAL SPINE MRI FINDINGS: Alignment: Physiologic.  Vertebrae: No fracture, evidence of discitis, or bone lesion.  Cord: Normal signal and morphology.  Posterior Fossa, vertebral arteries, paraspinal tissues: Negative.  Disc levels:  C2-3: Negative.  C3-4: Small central subligamentous disc protrusion slightly asymmetric to the right touching the ventral aspect of the spinal cord but without focal neural impingement.  C4-5: Small broad-based soft  disc protrusion without neural impingement.  C5-6: Tiny broad-based disc bulge with no neural impingement. Uncinate spurs slightly narrow the right neural foramen, unchanged.  C6-7: Small broad-based disc bulge without neural impingement. Uncinate spurs narrow the left neural foramen, unchanged.  IMPRESSION: 1. No discrete cervical myelopathy. 2. Multilevel degenerative disc disease with small disc protrusions at C3-4 and C4-5 without focal neural impingement. Three slight right foraminal narrowing at C5-6 and slight left  foraminal narrowing at C6-7 due to uncinate spurs, unchanged.       Both scans are reviewed in person.   There is acute signal seen on DWI involving the splenium of the corpus callosum on the right side.  This is a small lesion with no significant notable finding noted on the ADC scan.  There are multiple mostly tiny white matter lesions there seen in the juxtacortical in deep white matter distribution of rather than periventricular.  There is are old encephalomalacia involving the inferior cerebellum on the left side.  No significant contrast enhancement is appreciated.  There is a tiny area of reduced signal on SWI involving the posterior right frontal area suspicious for remote  Hemorrhage.  The scans are compared to the 1 done a year ago in 2019.  The current scan shows a dramatic reduction in white matter lesions compared to the previous scan.  The MRA done a year ago shows no intracranial occlusive disease.  Cervical spine MRI shows mild multilevel this degeneration but no significant central canal stenosis.         Medications:  Scheduled Meds:  amLODipine  10 mg Oral Daily   cloNIDine  0.2 mg Transdermal Weekly   [START ON 02/13/2019] clopidogrel  75 mg Oral Q breakfast   gabapentin  300 mg Oral TID   hydrALAZINE  25 mg Oral Q8H   labetalol  300 mg Oral TID   loratadine  10 mg Oral Daily   multivitamin with minerals  1 tablet Oral Daily   traZODone  50 mg Oral QHS   Continuous Infusions:  lactated ringers 125 mL/hr at 02/12/19 1401   methylPREDNISolone (SOLU-MEDROL) injection 1,000 mg (02/11/19 2345)   PRN Meds:.acetaminophen **OR** acetaminophen, hydrALAZINE, HYDROmorphone (DILAUDID) injection, methocarbamol, Muscle Rub     LOS: 3 days   Harriett Azar A. Merlene Laughter, M.D.  Diplomate, Tax adviser of Psychiatry and Neurology ( Neurology).

## 2019-02-12 NOTE — Care Management Important Message (Signed)
Important Message  Patient Details  Name: Joyce [name] MRN: [id number] Date of Birth: 1955/11/15   Medicare Important Message Given:  Yes     Tommy Medal 02/12/2019, 1:02 PM

## 2019-02-12 NOTE — Progress Notes (Signed)
PT Cancellation Note  Patient Details Name: Joyce [name] MRN: [id number] DOB: 1956/01/14   Cancelled Treatment:    Reason Eval/Treat Not Completed: Patient at procedure or test/unavailable.  Unable to attempt physical therapy secondary to patient to lie flat for rest of day after procedure (see notes).  Will check back tomorrow.    3:40 PM, 02/12/19 Lonell Grandchild, MPT Physical Therapist with [facility] [phone] office [phone] mobile phone

## 2019-02-12 NOTE — Progress Notes (Signed)
Lumbar Puncture complete no signs of distress.
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
## 2019-02-13 ENCOUNTER — Inpatient Hospital Stay (HOSPITAL_COMMUNITY): Payer: Medicare Other

## 2019-02-13 DIAGNOSIS — I361 Nonrheumatic tricuspid (valve) insufficiency: Secondary | ICD-10-CM

## 2019-02-13 LAB — BASIC METABOLIC PANEL
Anion gap: 8 (ref 5–15)
BUN: 37 mg/dL — ABNORMAL HIGH (ref 8–23)
CO2: 23 mmol/L (ref 22–32)
Calcium: 8.4 mg/dL — ABNORMAL LOW (ref 8.9–10.3)
Chloride: 105 mmol/L (ref 98–111)
Creatinine, Ser: 1.54 mg/dL — ABNORMAL HIGH (ref 0.44–1.00)
GFR calc Af Amer: 41 mL/min — ABNORMAL LOW (ref >60)
GFR calc non Af Amer: 36 mL/min — ABNORMAL LOW (ref >60)
Glucose, Bld: 137 mg/dL — ABNORMAL HIGH (ref 70–99)
Potassium: 4.6 mmol/L (ref 3.5–5.1)
Sodium: 136 mmol/L (ref 135–145)

## 2019-02-13 LAB — CBC
HCT: 28.5 % — ABNORMAL LOW (ref 36.0–46.0)
Hemoglobin: 9 g/dL — ABNORMAL LOW (ref 12.0–15.0)
MCH: 25.6 pg — ABNORMAL LOW (ref 26.0–34.0)
MCHC: 31.6 g/dL (ref 30.0–36.0)
MCV: 81 fL (ref 80.0–100.0)
Platelets: 235 10*3/uL (ref 150–400)
RBC: 3.52 MIL/uL — ABNORMAL LOW (ref 3.87–5.11)
RDW: 16.8 % — ABNORMAL HIGH (ref 11.5–15.5)
WBC: 14.1 10*3/uL — ABNORMAL HIGH (ref 4.0–10.5)
nRBC: 0 % (ref 0.0–0.2)

## 2019-02-13 LAB — LIPID PANEL
Cholesterol: 237 mg/dL — ABNORMAL HIGH (ref 0–200)
HDL: 57 mg/dL (ref >40)
LDL Cholesterol: 165 mg/dL — ABNORMAL HIGH (ref 0–99)
Total CHOL/HDL Ratio: 4.2 RATIO
Triglycerides: 76 mg/dL (ref <150)
VLDL: 15 mg/dL (ref 0–40)

## 2019-02-13 LAB — HEMOGLOBIN A1C
Hgb A1c MFr Bld: 5.9 % — ABNORMAL HIGH (ref 4.8–5.6)
Mean Plasma Glucose: 122.63 mg/dL

## 2019-02-13 MED ORDER — SENNOSIDES-DOCUSATE SODIUM 8.6-50 MG PO TABS
1.0000 | ORAL_TABLET | Freq: Every morning | ORAL | Status: DC
  Administered 2019-02-13 – 2019-02-17 (×5): 1 via ORAL
  Filled 2019-02-13 (×5): qty 1

## 2019-02-13 MED ORDER — SIMVASTATIN 10 MG PO TABS
10.0000 mg | ORAL_TABLET | Freq: Every day | ORAL | Status: DC
  Administered 2019-02-14 – 2019-02-17 (×4): 10 mg via ORAL
  Filled 2019-02-13 (×4): qty 1

## 2019-02-13 MED ORDER — OXYCODONE-ACETAMINOPHEN 5-325 MG PO TABS
2.0000 | ORAL_TABLET | Freq: Four times a day (QID) | ORAL | Status: DC
  Administered 2019-02-13 – 2019-02-17 (×17): 2 via ORAL
  Filled 2019-02-13 (×17): qty 2

## 2019-02-13 MED ORDER — SIMVASTATIN 20 MG PO TABS
40.0000 mg | ORAL_TABLET | Freq: Every day | ORAL | Status: DC
  Administered 2019-02-13: 40 mg via ORAL

## 2019-02-13 MED ORDER — HYDROMORPHONE HCL 1 MG/ML IJ SOLN
0.5000 mg | INTRAMUSCULAR | Status: DC | PRN
  Administered 2019-02-13 – 2019-02-15 (×10): 0.5 mg via INTRAMUSCULAR
  Filled 2019-02-13 (×5): qty 0.5

## 2019-02-13 NOTE — Progress Notes (Signed)
PT Cancellation Note  Patient Details Name: Joyce [name] MRN: [id number] DOB: 1955/11/28   Cancelled Treatment:    Reason Eval/Treat Not Completed: Other (comment).  Patient declined therapy secondary to c/o severe pain all over body and wants to wait until IV set up to receive IV pain medication before attempting to sit up - RN notified.   10:58 AM, 02/13/19 Lonell Grandchild, MPT Physical Therapist with [facility] [phone] office [phone] mobile phone

## 2019-02-13 NOTE — Progress Notes (Signed)
*  PRELIMINARY RESULTS* Echocardiogram 2D Echocardiogram  has been performed. Unable to do bubble study due to inability to start IV. Patient no longer had IV, two nurses attempted to start new IV. Discussed with Dr.Shah, he said okay to not do bubble study. Told him we could do limited at later time, if has IV.  Joyce [name] 02/13/2019, 4:17 PM

## 2019-02-13 NOTE — Progress Notes (Signed)
PROGRESS NOTE    Joyce [name]  VFI:[id number] DOB: [birth year] DOA: 02/08/2019 PCP: Lucia Gaskins, MD   Brief Narrative:  Per HPI: [name] y.o.female,w hypertension, hyperlipidemia, h/o stroke, ckd stage3, depression, chronic right leg pain, apparently presents with c/o muscle spasm, pt notes being in [facility] and doing coccaine. Pt notes that she has been taking Nsaids. Pt denies fever, chills, rash, cough, cp, palp, sob, n/v, abd pain, diarrhea, brbpr, black stool, dysuria, hematuria. Pt has not noticed any change in urine output. Pt does note that she has had poor po intake recently.  Patient was admitted for AKI on CKD stage III along with some mild rhabdomyolysis secondary to cocaine use. She continues to have ongoing, severe muscle spasms to her lower extremities with negative work-up on MRI of the thoracic and lumbar spine.She was seen by neurology on 7/8 with concerns for cervical myelopathy for which MRI of the cervical spine was performed with no findings of this noted. 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  Recommendations are for Plavix to be started after spinal tap that will be performed on 7/10.  PT has recommended SNF on discharge.  7/11: Patient has completed 3-day course of IV high-dose steroids per neurology.  Will discontinue IV fluid today.  Improved pain management with oral scheduled Percocet and IM Dilaudid as needed.  Gabapentin had been increased back to her usual home dose of 300 mg 3 times daily on 7/10.  Appreciate neurology input on 7/10 with 2D echocardiogram and carotid ultrasound pending for CVA of corpus callosum noted.  CSF study thus far within normal limits, but other studies pending and neurology will follow-up.  Neurology has  recommended 1 month follow-up upon discharge.  Awaiting SNF placement at this time.  She will also need nephrology follow-up outpatient.   Assessment & Plan:   Principal Problem:   ARF (acute renal failure) (HCC) Active Problems:   Essential hypertension, benign   Hyperlipidemia   Anemia   ARF on CKD stage 3, appears to intrinsic with Fena 1.2%-improving ESR is 48 Renal ultrasound with no acute findings No further need for IV fluid currently Recheck labs in a.m.  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 noted Continue current steroids for 3 days Start Plavix as ordered by neurology today PT evaluation reveals need for SNF on discharge  Hypertension-stable STOP Lisinopril / hydrochlorothiazide temporarily Cont Amlodipine 30m po qday Cont Labetalol 3066mpo bid Cont Clonidine patch 0.20m54mopically q week We will add IV hydralazine as needed for severe elevations as well as hydralazine 3 times daily for now Increased oral hydralazine dose to 25 mg 3 times daily on 7/10  Hyperlipidemia Resume simvastatin today 7/11 Check lipid profile as well as hemoglobin A1c  Chronic right lower ext pain Gabapentin increased to 300m96m 3 times daily due to improvement in AKI and hopefully to help symptoms Continue on Robaxin as needed Dilaudid added for severe pain and cramps now to IM Percocet 10/325 every 6 hours to help with pain management  Iron deficiency anemia-stable 1 dose of Feraheme given 7/7 No overt  bleeding noted Repeat CBC in a.m. and monitor   DVT prophylaxis:Heparin Code Status:Full Family Communication:None at bedside Disposition Plan:Continue pain management as ordered.  Repeat labs in a.m.  and order more IV fluid as needed.  2D echocardiogram and carotid ultrasound pending and ordered by neurology on 7/10.  PT evaluation reveals need for SNF on discharge hopefully by 7/13.  Patient requires inpatient stay on for pain management and SNF placement.  May require some more IV fluid.  Needs IM shots of pain management.  Repeat labs in a.m.   Consultants:  Neurology  Procedures:  Spinal tap 7/10  Antimicrobials:   None  Subjective: Patient seen and evaluated today with ongoing pain noted particularly to her right lower extremity.  She continues to have some headaches too.  She states that her pain level is 7/10 most of the time.  Objective: Vitals:   02/12/19 1927 02/12/19 2130 02/13/19 0538 02/13/19 0933  BP:  (!) 170/70 (!) 173/65 (!) 175/69  Pulse:  65 62 71  Resp:  18 18   Temp:  97.7 F (36.5 C) 98.2 F (36.8 C) 98.8 F (37.1 C)  TempSrc:  Oral Oral Oral  SpO2: 99% 100% 98% 99%  Weight:   115.5 kg   Height:        Intake/Output Summary (Last 24 hours) at 02/13/2019 1044 Last data filed at 02/13/2019 0619 Gross per 24 hour  Intake 480 ml  Output 1650 ml  Net -1170 ml   Filed Weights   02/11/19 0643 02/12/19 0458 02/13/19 0538  Weight: 114.3 kg 115.8 kg 115.5 kg    Examination:  General exam: Appears calm and comfortable  Respiratory system: Clear to auscultation. Respiratory effort normal. Cardiovascular system: S1 & S2 heard, RRR. No JVD, murmurs, rubs, gallops or clicks. No pedal edema. Gastrointestinal system: Abdomen is nondistended, soft and nontender. No organomegaly or masses felt. Normal bowel sounds heard. Central nervous system: Alert and oriented. No focal neurological deficits. Extremities: Symmetric 5 x 5 power. Skin: No rashes, lesions or ulcers Psychiatry: Judgement and insight appear normal. Mood & affect appropriate.     Data Reviewed: I have personally reviewed following labs and imaging studies  CBC: Recent Labs   Lab 02/08/19 1426 02/09/19 0418 02/10/19 0607 02/11/19 0529 02/12/19 0508 02/13/19 0520  WBC 10.6* 9.3 7.1 5.9 13.9* 14.1*  NEUTROABS 7.4  --   --   --   --   --   HGB 10.6* 9.3* 10.1* 10.4* 9.6* 9.0*  HCT 33.7* 30.1*   28.3* 34.1* 34.0* 30.6* 28.5*  MCV 80.0 82.9 84.8 82.9 81.2 81.0  PLT 313 230 243 241 239 115   Basic Metabolic Panel: Recent Labs  Lab 02/09/19 0418 02/10/19 0607 02/11/19 0529 02/12/19 0508 02/13/19 0520  NA 139 142 139 137 136  K 3.3* 4.4 4.7 4.6 4.6  CL 111 113* 108 107 105  CO2 19* 22 20* 23 23  GLUCOSE 139* 67* 130* 141* 137*  BUN 44* 40* 31* 31* 37*  CREATININE 2.75* 2.28* 1.65* 1.56* 1.54*  CALCIUM 8.0* 8.3* 9.0 8.8* 8.4*  MG  --  2.4  --   --   --    GFR: Estimated Creatinine Clearance: 49.1 mL/min (A) (by C-G formula based on SCr of 1.54 mg/dL (H)). Liver Function Tests: Recent Labs  Lab 02/08/19 1426 02/09/19 0418  AST 31 24  ALT 16 14  ALKPHOS 85 82  BILITOT 0.4 0.2*  PROT 7.0 5.7*  ALBUMIN 3.7 3.1*  No results for input(s): LIPASE, AMYLASE in the last 168 hours. No results for input(s): AMMONIA in the last 168 hours. Coagulation Profile: No results for input(s): INR, PROTIME in the last 168 hours. Cardiac Enzymes: Recent Labs  Lab 02/08/19 1508 02/09/19 0418 02/10/19 0607  CKTOTAL 763* 463* 379*  CKMB  --  10.1*  --    BNP (last 3 results) No results for input(s): PROBNP in the last 8760 hours. HbA1C: No results for input(s): HGBA1C in the last 72 hours. CBG: No results for input(s): GLUCAP in the last 168 hours. Lipid Profile: No results for input(s): CHOL, HDL, LDLCALC, TRIG, CHOLHDL, LDLDIRECT in the last 72 hours. Thyroid Function Tests: No results for input(s): TSH, T4TOTAL, FREET4, T3FREE, THYROIDAB in the last 72 hours. Anemia Panel: No results for input(s): VITAMINB12, FOLATE, FERRITIN, TIBC, IRON, RETICCTPCT in the last 72 hours. Sepsis Labs: No results for input(s): PROCALCITON, LATICACIDVEN in the last  168 hours.  Recent Results (from the past 240 hour(s))  SARS Coronavirus 2 (CEPHEID - Performed in [place] hospital lab), Hosp Order     Status: None   Collection Time: 02/08/19  8:35 PM   Specimen: Nasopharyngeal Swab  Result Value Ref Range Status   SARS Coronavirus 2 NEGATIVE NEGATIVE Final    Comment: (NOTE) If result is NEGATIVE SARS-CoV-2 target nucleic acids are NOT DETECTED. The SARS-CoV-2 RNA is generally detectable in upper and lower  respiratory specimens during the acute phase of infection. The lowest  concentration of SARS-CoV-2 viral copies this assay can detect is 250  copies / mL. A negative result does not preclude SARS-CoV-2 infection  and should not be used as the sole basis for treatment or other  patient management decisions.  A negative result may occur with  improper specimen collection / handling, submission of specimen other  than nasopharyngeal swab, presence of viral mutation(s) within the  areas targeted by this assay, and inadequate number of viral copies  (<250 copies / mL). A negative result must be combined with clinical  observations, patient history, and epidemiological information. If result is POSITIVE SARS-CoV-2 target nucleic acids are DETECTED. The SARS-CoV-2 RNA is generally detectable in upper and lower  respiratory specimens dur ing the acute phase of infection.  Positive  results are indicative of active infection with SARS-CoV-2.  Clinical  correlation with patient history and other diagnostic information is  necessary to determine patient infection status.  Positive results do  not rule out bacterial infection or co-infection with other viruses. If result is PRESUMPTIVE POSTIVE SARS-CoV-2 nucleic acids MAY BE PRESENT.   A presumptive positive result was obtained on the submitted specimen  and confirmed on repeat testing.  While 2019 novel coronavirus  (SARS-CoV-2) nucleic acids may be present in the submitted sample  additional  confirmatory testing may be necessary for epidemiological  and / or clinical management purposes  to differentiate between  SARS-CoV-2 and other Sarbecovirus currently known to infect humans.  If clinically indicated additional testing with an alternate test  methodology [phone]) is advised. The SARS-CoV-2 RNA is generally  detectable in upper and lower respiratory sp ecimens during the acute  phase of infection. The expected result is Negative. Fact Sheet for Patients:  StrictlyIdeas.no Fact Sheet for Healthcare Providers: BankingDealers.co.za This test is not yet approved or cleared by the [place] FDA and has been authorized for detection and/or diagnosis of SARS-CoV-2 by FDA under an Emergency Use Authorization (EUA).  This EUA will remain in effect (meaning this  test can be used) for the duration of the COVID-19 declaration under Section 564(b)(1) of the Act, 21 U.S.C. section 360bbb-3(b)(1), unless the authorization is terminated or revoked sooner. Performed at [facility], [address], [place], [place] [address]          Radiology Studies: Mr Brain Wo Contrast  Result Date: 02/11/2019 CLINICAL DATA:  Headache, weakness and difficulty walking over the last 3 days. EXAM: MRI HEAD WITHOUT CONTRAST TECHNIQUE: Multiplanar, multiecho pulse sequences of the brain and surrounding structures were obtained without intravenous contrast. COMPARISON:  01/22/2018 FINDINGS: Brain: The brainstem is normal. 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 Cerebral hemispheres show moderate changes of chronic small vessel disease affecting the thalami, basal ganglia and hemispheric deep white matter. There is a newly seen 8 mm focus of restricted diffusion in the right splenium of the corpus callosum. This could represent a small vessel infarction or a focus of demyelination. No large vessel territory infarction. No evidence of  neoplastic mass lesion. After contrast administration, there is a 4 mm focus of enhancement in the right posterior parietal subcortical white matter. 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 No prior contrast enhanced exam. Vascular: Major vessels at the base of the brain show flow. Skull and upper cervical spine: Negative Sinuses/Orbits: Clear/normal Other: None IMPRESSION: Newly seen 8 mm focus of restricted diffusion in the right splenium of the corpus callosum. This could be a subacute infarction or a focus of demyelination. Elsewhere, there chronic small-vessel ischemic changes affecting the left cerebellum, both thalami and basal ganglia and the cerebral hemispheric white matter. It would seem that small-vessel disease is the most likely diagnosis, particularly given the history of hypertension and hyperlipidemia. 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 Electronically Signed   By: Nelson Chimes M.D.   On: 02/11/2019 13:47   Dg Fluoro Guided Needle Plc Aspiration/injection Loc  Result Date: 02/12/2019 CLINICAL DATA:  Symptoms suggesting multiple sclerosis EXAM: DIAGNOSTIC LUMBAR PUNCTURE UNDER FLUOROSCOPIC GUIDANCE FLUOROSCOPY TIME:  Fluoroscopy Time:  0 minutes 50 seconds Radiation Exposure Index (if provided by the fluoroscopic device): 48.2 mGy Number of Acquired Spot Images: 1 PROCEDURE: Procedure, benefits, and risks were discussed with the patient, including alternatives. Patient's questions were answered. Written informed consent was obtained. Timeout protocol followed. Patient placed prone. L3-L4 disc space was localized under fluoroscopy. Skin prepped and draped in usual sterile fashion. Skin and soft tissues anesthetized with 3 mL of 1% lidocaine. A 22 gauge needle was advanced into the spine. Unable to access spinal  canal at this level due to dense fibrosis/calcification. Patient placed RAO and attention turned to the LEFT oblique approach at L4-L5. Skin and soft tissues anesthetized with 2 mL of 1% lidocaine. A 22 gauge needle advanced into the spinal canal where clear colorless CSF was encountered. Opening pressure 25 cm H2O. 11 mL of CSF was obtained in 4 tubes for requested analysis. Procedure tolerated well by patient without immediate complication. IMPRESSION: Fluoroscopic guided lumbar puncture as above. Electronically Signed   By: Lavonia Dana M.D.   On: 02/12/2019 13:08        Scheduled Meds:  amLODipine  10 mg Oral Daily   cloNIDine  0.2 mg Transdermal Weekly   clopidogrel  75 mg Oral Q breakfast   gabapentin  300 mg Oral TID  hydrALAZINE  25 mg Oral Q8H   labetalol  300 mg Oral TID   loratadine  10 mg Oral Daily   multivitamin with minerals  1 tablet Oral Daily   oxyCODONE-acetaminophen  2 tablet Oral Q6H   senna-docusate  1 tablet Oral q morning - 10a   traZODone  50 mg Oral QHS   Continuous Infusions:   LOS: 4 days    Time spent: 30 minutes    Sitara Cashwell Darleen Crocker, DO [facility] Pager [phone]  If 7PM-7AM, please contact night-coverage [email or web address] Password [facility] 02/13/2019, 10:44 AM

## 2019-02-14 DIAGNOSIS — I63032 Cerebral infarction due to thrombosis of left carotid artery: Secondary | ICD-10-CM

## 2019-02-14 DIAGNOSIS — G35 Multiple sclerosis: Secondary | ICD-10-CM

## 2019-02-14 DIAGNOSIS — M5441 Lumbago with sciatica, right side: Secondary | ICD-10-CM

## 2019-02-14 LAB — BASIC METABOLIC PANEL
Anion gap: 9 (ref 5–15)
BUN: 44 mg/dL — ABNORMAL HIGH (ref 8–23)
CO2: 25 mmol/L (ref 22–32)
Calcium: 8.3 mg/dL — ABNORMAL LOW (ref 8.9–10.3)
Chloride: 105 mmol/L (ref 98–111)
Creatinine, Ser: 1.91 mg/dL — ABNORMAL HIGH (ref 0.44–1.00)
GFR calc Af Amer: 32 mL/min — ABNORMAL LOW (ref >60)
GFR calc non Af Amer: 27 mL/min — ABNORMAL LOW (ref >60)
Glucose, Bld: 102 mg/dL — ABNORMAL HIGH (ref 70–99)
Potassium: 4.2 mmol/L (ref 3.5–5.1)
Sodium: 139 mmol/L (ref 135–145)

## 2019-02-14 LAB — CBC
HCT: 31.9 % — ABNORMAL LOW (ref 36.0–46.0)
Hemoglobin: 9.7 g/dL — ABNORMAL LOW (ref 12.0–15.0)
MCH: 25.6 pg — ABNORMAL LOW (ref 26.0–34.0)
MCHC: 30.4 g/dL (ref 30.0–36.0)
MCV: 84.2 fL (ref 80.0–100.0)
Platelets: 258 10*3/uL (ref 150–400)
RBC: 3.79 MIL/uL — ABNORMAL LOW (ref 3.87–5.11)
RDW: 17.2 % — ABNORMAL HIGH (ref 11.5–15.5)
WBC: 14.3 10*3/uL — ABNORMAL HIGH (ref 4.0–10.5)
nRBC: 0 % (ref 0.0–0.2)

## 2019-02-14 MED ORDER — SODIUM CHLORIDE 0.9 % IV SOLN
INTRAVENOUS | Status: DC
  Administered 2019-02-17: 02:00:00 via INTRAVENOUS

## 2019-02-14 NOTE — Progress Notes (Signed)
PROGRESS NOTE    JOLIANA [name]  CBU:[id number] DOB: [birth year] DOA: 02/08/2019 PCP: Lucia Gaskins, MD   Brief Narrative:  Per HPI: Joyce [name] y.o.female,w hypertension, hyperlipidemia, h/o stroke, ckd stage3, depression, chronic right leg pain, apparently presents with c/o muscle spasm, pt notes being in [facility] and doing coccaine. Pt notes that she has been taking Nsaids. Pt denies fever, chills, rash, cough, cp, palp, sob, n/v, abd pain, diarrhea, brbpr, black stool, dysuria, hematuria. Pt has not noticed any change in urine output. Pt does note that she has had poor po intake recently.  Patient was admitted for AKI on CKD stage III along with some mild rhabdomyolysis secondary to cocaine use. She continues to have ongoing, severe muscle spasms to her lower extremities with negative work-up on MRI of the thoracic and lumbar spine.She was seen by neurology on 7/8 with concerns for cervical myelopathy for which MRI of the cervical spine was performed with no findings of this noted. 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  Recommendations are for Plavix to be started after spinal tap that will be performed on 7/10.  PT has recommended SNF on discharge.  7/11: Patient has completed 3-day course of IV high-dose steroids per neurology.  Will discontinue IV fluid today.  Improved pain management with oral scheduled Percocet and IM Dilaudid as needed.  Gabapentin had been increased back to her usual home dose of 300 mg 3 times daily on 7/10.  Appreciate neurology input on 7/10 with 2D echocardiogram and carotid ultrasound pending for CVA of corpus callosum noted.  CSF study thus far within normal limits, but other studies pending and neurology will follow-up.  Neurology has  recommended 1 month follow-up upon discharge.  Awaiting SNF placement at this time.  She will also need nephrology follow-up outpatient.   Assessment & Plan:   Principal Problem:   ARF (acute renal failure) (HCC) Active Problems:   Essential hypertension, benign   Hyperlipidemia   Anemia   ARF on CKD stage 3, appears to intrinsic with Fena 1.2%-improving -ESR is 48 -Renal ultrasound with no acute findings -Creatinine elevated once again to 1.9 -Continue holding nephrotoxic agents -Resume IV fluid resuscitation -Follow basic metabolic panel in a.m.  Mild Rhabdomyolysis likely secondary to admitted coccaine use and dehydration -TSH 4.01 -Repeat CK with improvement -Continue IV fluids.  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  Hypertension-stable -STOP Lisinopril / hydrochlorothiazide temporarily given acute kidney injury. -Cont Amlodipine '10mg'$  po qday -Cont Labetalol '300mg'$  po bid -Cont Clonidine patch 0.'2mg'$  Topically q week -continue hydralazine 3 times daily for now -follow heart healthy diet   Hyperlipidemia -Continue simvastatin -Heart healthy diet recommended.  Chronic right lower ext pain -Gabapentin 300 mg 3 times a day as -Continue on Robaxin as needed -continue Percocet 10/325 every 6 hours to help with pain management -continue PRN dilaudid  Iron deficiency anemia-stable -1 dose of Feraheme given 7/7 -No overt bleeding appreciated -Repeat CBC and monitor hemoglobin trend intermittently.   DVT prophylaxis:Heparin Code Status:Full Family Communication:None at bedside Disposition Plan:Continue current  pain management.  Close  follow-up of patient's renal function (creatinine trending up again).  Continue IV fluids.  Complete a stroke work-up.  Discharge to skilled nursing facility once bed available and work-up completed.  Consultants:  Neurology  Procedures:  Spinal tap 7/10  Antimicrobials:   None  Subjective: Afebrile, no chest pain, no nausea, no vomiting.  Still experiencing intermittent lower extremity discomfort.  Feeling weak, deconditioned and poor balance.  Objective: Vitals:   02/13/19 1945 02/13/19 2136 02/14/19 0522 02/14/19 1420  BP:  (!) 160/70 (!) 153/66 (!) 153/58  Pulse:  62 (!) 57 (!) 53  Resp:  '18 18 16  '$ Temp:  98.2 F (36.8 C) 98.7 F (37.1 C)   TempSrc:  Oral Oral   SpO2: 98% 97% 99% 100%  Weight:   116.5 kg   Height:        Intake/Output Summary (Last 24 hours) at 02/14/2019 1757 Last data filed at 02/14/2019 1200 Gross per 24 hour  Intake 720 ml  Output 400 ml  Net 320 ml   Filed Weights   02/12/19 0458 02/13/19 0538 02/14/19 0522  Weight: 115.8 kg 115.5 kg 116.5 kg    Examination: General exam: Alert, awake, oriented x 3; no fever, no chest pain, no shortness of breath.  Denies nausea vomiting.  Reports still being significantly weak and deconditioning.  Respiratory system: Clear to auscultation. Respiratory effort normal. Cardiovascular system:RRR. No murmurs, rubs, gallops. Gastrointestinal system: Abdomen is nondistended, soft and nontender. No organomegaly or masses felt. Normal bowel sounds heard. Central nervous system: Alert and oriented. No focal neurological deficits. Extremities: No C/C/E, +pedal pulses Skin: No rashes, lesions or ulcers Psychiatry: Judgement and insight appear normal. Mood & affect appropriate.    Data Reviewed: I have personally reviewed following labs and imaging studies  CBC: Recent Labs  Lab 02/08/19 1426  02/10/19 0607 02/11/19 0529 02/12/19 0508 02/13/19 0520 02/14/19 0517  WBC 10.6*   < > 7.1 5.9 13.9* 14.1*  14.3*  NEUTROABS 7.4  --   --   --   --   --   --   HGB 10.6*   < > 10.1* 10.4* 9.6* 9.0* 9.7*  HCT 33.7*   < > 34.1* 34.0* 30.6* 28.5* 31.9*  MCV 80.0   < > 84.8 82.9 81.2 81.0 84.2  PLT 313   < > 243 241 239 235 258   < > = values in this interval not displayed.   Basic Metabolic Panel: Recent Labs  Lab 02/10/19 0607 02/11/19 0529 02/12/19 0508 02/13/19 0520 02/14/19 0517  NA 142 139 137 136 139  K 4.4 4.7 4.6 4.6 4.2  CL 113* 108 107 105 105  CO2 22 20* '23 23 25  '$ GLUCOSE 67* 130* 141* 137* 102*  BUN 40* 31* 31* 37* 44*  CREATININE 2.28* 1.65* 1.56* 1.54* 1.91*  CALCIUM 8.3* 9.0 8.8* 8.4* 8.3*  MG 2.4  --   --   --   --    GFR: Estimated Creatinine Clearance: 39.8 mL/min (A) (by C-G formula based on SCr of 1.91 mg/dL (H)).   Liver Function Tests: Recent Labs  Lab 02/08/19 1426 02/09/19 0418  AST 31 24  ALT 16 14  ALKPHOS 85 82  BILITOT 0.4 0.2*  PROT 7.0 5.7*  ALBUMIN 3.7 3.1*   Cardiac Enzymes: Recent Labs  Lab 02/08/19 1508 02/09/19 0418 02/10/19 0607  CKTOTAL 763* 463* 379*  CKMB  --  10.1*  --    HbA1C: Recent Labs  02/13/19 0520  HGBA1C 5.9*   Lipid Profile: Recent Labs    02/13/19 0520  CHOL 237*  HDL 57  LDLCALC 165*  TRIG 76  CHOLHDL 4.2    Recent Results (from the past 240 hour(s))  SARS Coronavirus 2 (CEPHEID - Performed in [place] hospital lab), Hosp Order     Status: None   Collection Time: 02/08/19  8:35 PM   Specimen: Nasopharyngeal Swab  Result Value Ref Range Status   SARS Coronavirus 2 NEGATIVE NEGATIVE Final    Comment: (NOTE) If result is NEGATIVE SARS-CoV-2 target nucleic acids are NOT DETECTED. The SARS-CoV-2 RNA is generally detectable in upper and lower  respiratory specimens during the acute phase of infection. The lowest  concentration of SARS-CoV-2 viral copies this assay can detect is 250  copies / mL. A negative result does not preclude SARS-CoV-2 infection  and should not be used as the sole basis for  treatment or other  patient management decisions.  A negative result may occur with  improper specimen collection / handling, submission of specimen other  than nasopharyngeal swab, presence of viral mutation(s) within the  areas targeted by this assay, and inadequate number of viral copies  (<250 copies / mL). A negative result must be combined with clinical  observations, patient history, and epidemiological information. If result is POSITIVE SARS-CoV-2 target nucleic acids are DETECTED. The SARS-CoV-2 RNA is generally detectable in upper and lower  respiratory specimens dur ing the acute phase of infection.  Positive  results are indicative of active infection with SARS-CoV-2.  Clinical  correlation with patient history and other diagnostic information is  necessary to determine patient infection status.  Positive results do  not rule out bacterial infection or co-infection with other viruses. If result is PRESUMPTIVE POSTIVE SARS-CoV-2 nucleic acids MAY BE PRESENT.   A presumptive positive result was obtained on the submitted specimen  and confirmed on repeat testing.  While 2019 novel coronavirus  (SARS-CoV-2) nucleic acids may be present in the submitted sample  additional confirmatory testing may be necessary for epidemiological  and / or clinical management purposes  to differentiate between  SARS-CoV-2 and other Sarbecovirus currently known to infect humans.  If clinically indicated additional testing with an alternate test  methodology [phone]) is advised. The SARS-CoV-2 RNA is generally  detectable in upper and lower respiratory sp ecimens during the acute  phase of infection. The expected result is Negative. Fact Sheet for Patients:  StrictlyIdeas.no Fact Sheet for Healthcare Providers: BankingDealers.co.za This test is not yet approved or cleared by the [place] FDA and has been authorized for detection and/or  diagnosis of SARS-CoV-2 by FDA under an Emergency Use Authorization (EUA).  This EUA will remain in effect (meaning this test can be used) for the duration of the COVID-19 declaration under Section 564(b)(1) of the Act, 21 U.S.C. section 360bbb-3(b)(1), unless the authorization is terminated or revoked sooner. Performed at [facility], [address], [place], [place] [address]      Radiology Studies: US Carotid Bilateral  Result Date: 02/13/2019 CLINICAL DATA:  Recent stroke. EXAM: BILATERAL CAROTID DUPLEX ULTRASOUND TECHNIQUE: Pearline Cables scale imaging, color Doppler and duplex ultrasound were performed of bilateral carotid and vertebral arteries in the neck. COMPARISON:  01/23/2018 FINDINGS: Criteria: Quantification of carotid stenosis is based on velocity parameters that correlate the residual internal carotid diameter with NASCET-based stenosis levels, using the diameter of the distal internal carotid lumen as the denominator for stenosis measurement. The following velocity measurements were  obtained: RIGHT ICA: 77/13 cm/sec CCA: 500/93 cm/sec SYSTOLIC ICA/CCA RATIO:  0.7 ECA: 64 cm/sec LEFT ICA: 107/19 cm/sec CCA: 818/29 cm/sec SYSTOLIC ICA/CCA RATIO:  0.8 ECA: 87 cm/sec RIGHT CAROTID ARTERY: Intimal thickening in the right common carotid artery. Heterogeneous plaque at the right carotid bulb. Plaque at the origin of the external carotid artery. External carotid artery is patent with normal waveform. Heterogeneous plaque in the proximal internal carotid artery. Normal waveforms and velocities in the internal carotid artery. RIGHT VERTEBRAL ARTERY: Antegrade flow and normal waveform in the right vertebral artery. LEFT CAROTID ARTERY: Intimal thickening in the left common carotid artery. External carotid artery is patent with normal waveform. Normal waveforms and velocities in the internal carotid artery. LEFT VERTEBRAL ARTERY: Antegrade flow and normal waveform in the left vertebral artery. IMPRESSION:  Atherosclerotic disease in the bilateral carotid arteries, right side greater than left. Estimated degree of stenosis in the internal carotid arteries is less than 50% bilaterally. Patent vertebral arteries with antegrade flow. Electronically Signed   By: Markus Daft M.D.   On: 02/13/2019 13:14    Scheduled Meds:  amLODipine  10 mg Oral Daily   cloNIDine  0.2 mg Transdermal Weekly   clopidogrel  75 mg Oral Q breakfast   gabapentin  300 mg Oral TID   hydrALAZINE  25 mg Oral Q8H   labetalol  300 mg Oral TID   loratadine  10 mg Oral Daily   multivitamin with minerals  1 tablet Oral Daily   oxyCODONE-acetaminophen  2 tablet Oral Q6H   senna-docusate  1 tablet Oral q morning - 10a   simvastatin  10 mg Oral Daily   traZODone  50 mg Oral QHS   Continuous Infusions:   LOS: 5 days    Time spent: 30 minutes    Barton Dubois, DO [facility] Pager [phone]  02/14/2019, 5:57 PM

## 2019-02-14 NOTE — Progress Notes (Signed)
Physical Therapy Treatment Patient Details Name: MELLA INCLAN MRN: [id number] DOB: 1955/12/28 Today's Date: 02/14/2019    History of Present Illness Tawnie Horace  is a 63 y.o. female,w hypertension, hyperlipidemia, h/o stroke, ckd stage3, depression,  chronic right leg pain, apparently presents with c/o muscle spasm, pt notes being in [facility] and doing coccaine.  Pt notes that she has been taking Nsaids.  Pt denies fever, chills, rash, cough, cp, palp, sob, n/v, abd pain, diarrhea, brbpr, black stool, dysuria, hematuria.  Pt has not noticed any change in urine output.  Pt does note that she has had poor po intake recently. Patient was admitted for AKI on CKD stage III along with some mild rhabdomyolysis secondary to cocaine use.  She continues to have ongoing, severe muscle spasms to her lower extremities with negative work-up on MRI of the thoracic and lumbar spine.    PT Comments    Patient agreeable and motivated for therapy after receiving pain medication.  Patient demonstrates increased endurance/distance for gait training, requires manual assistance to advance RLE forward and backwards, able to move LLE backwards while standing with no weightbearing on RLE, unable to flex right  hip or extend knee against gravity, limited for ambulation due to c/o fatigue due to having to support most of body weight with BUE.  Patient tolerated sitting up in chair after therapy - RN aware.  Patient will benefit from continued physical therapy in hospital and recommended venue below to increase strength, balance, endurance for safe ADLs and gait.   Follow Up Recommendations  SNF     Equipment Recommendations  Rolling walker with 5" wheels    Recommendations for Other Services       Precautions / Restrictions Precautions Precautions: Fall Restrictions Weight Bearing Restrictions: No    Mobility  Bed Mobility Overal bed mobility: Needs Assistance Bed Mobility: Supine to Sit     Supine to sit:  Min guard;Min assist     General bed mobility comments: slow labored movement requiring assistance to move RLE  Transfers Overall transfer level: Needs assistance Equipment used: Rolling walker (2 wheeled) Transfers: Sit to/from [facility] Sit to Stand: Min guard;Min assist Stand pivot transfers: Min assist       General transfer comment: slow labored movement requiring tactile assistance to move RLE due to weakness  Ambulation/Gait Ambulation/Gait assistance: Mod assist Gait Distance (Feet): 22 Feet Assistive device: Rolling walker (2 wheeled) Gait Pattern/deviations: Decreased step length - right;Decreased stance time - right;Decreased stride length;Antalgic Gait velocity: decreased   General Gait Details: slow labored cadence, unable to advance RLE without manual assistance, used mostly upper body strength to maintain standing balance when taking steps with LLE, limited secondary to fatigue   Stairs             Wheelchair Mobility    Modified Rankin (Stroke Patients Only)       Balance Overall balance assessment: Needs assistance Sitting-balance support: Feet supported;No upper extremity supported Sitting balance-Leahy Scale: Fair Sitting balance - Comments: fair/good seated at EOB   Standing balance support: During functional activity;Bilateral upper extremity supported Standing balance-Leahy Scale: Fair Standing balance comment: using RW and mostly upper body strength to maintain standing balance                            Cognition Arousal/Alertness: Awake/alert Behavior During Therapy: WFL for tasks assessed/performed Overall Cognitive Status: Within Functional Limits for tasks assessed  Exercises General Exercises - Lower Extremity Long Arc Quad: Seated;AROM;AAROM;Both;10 reps Hip Flexion/Marching: Seated;AROM;AAROM;Both;10 reps Toe Raises: Seated;AROM;AAROM;Both;10  reps Heel Raises: Seated;AAROM;AROM;Both;10 reps    General Comments        Pertinent Vitals/Pain Pain Assessment: 0-10 Pain Score: 8  Pain Location: right pain from hip to knee, numbness from right knee down to foot,  pain from left knee down to foot Pain Descriptors / Indicators: Sore;Numbness Pain Intervention(s): Limited activity within patient's tolerance;Monitored during session;Premedicated before session    Home Living                      Prior Function            PT Goals (current goals can now be found in the care plan section) Acute Rehab PT Goals Patient Stated Goal: increase strength and stability PT Goal Formulation: With patient Time For Goal Achievement: 02/24/19 Potential to Achieve Goals: Good Progress towards PT goals: Progressing toward goals    Frequency    7X/week      PT Plan Current plan remains appropriate    Co-evaluation              AM-PAC PT "6 Clicks" Mobility   Outcome Measure  Help needed turning from your back to your side while in a flat bed without using bedrails?: A Little Help needed moving from lying on your back to sitting on the side of a flat bed without using bedrails?: A Little Help needed moving to and from a bed to a chair (including a wheelchair)?: A Little Help needed standing up from a chair using your arms (e.g., wheelchair or bedside chair)?: A Little Help needed to walk in hospital room?: A Lot Help needed climbing 3-5 steps with a railing? : Total 6 Click Score: 15    End of Session Equipment Utilized During Treatment: Gait belt Activity Tolerance: Patient tolerated treatment well;Patient limited by pain;Patient limited by fatigue Patient left: in chair;with call bell/phone within reach Nurse Communication: Mobility status PT Visit Diagnosis: Unsteadiness on feet (R26.81);Other abnormalities of gait and mobility (R26.89);Muscle weakness (generalized) (M62.81)     Time: 5366-4403 PT Time  Calculation (min) (ACUTE ONLY): 34 min  Charges:  $Gait Training: 8-22 mins $Therapeutic Exercise: 8-22 mins                     11:39 AM, 02/14/19 Lonell Grandchild, MPT Physical Therapist with [facility] 336 [phone] office [phone] mobile phone

## 2019-02-15 ENCOUNTER — Other Ambulatory Visit: Payer: Self-pay

## 2019-02-15 LAB — BASIC METABOLIC PANEL
Anion gap: 9 (ref 5–15)
BUN: 50 mg/dL — ABNORMAL HIGH (ref 8–23)
CO2: 27 mmol/L (ref 22–32)
Calcium: 8.2 mg/dL — ABNORMAL LOW (ref 8.9–10.3)
Chloride: 105 mmol/L (ref 98–111)
Creatinine, Ser: 2.06 mg/dL — ABNORMAL HIGH (ref 0.44–1.00)
GFR calc Af Amer: 29 mL/min — ABNORMAL LOW (ref >60)
GFR calc non Af Amer: 25 mL/min — ABNORMAL LOW (ref >60)
Glucose, Bld: 89 mg/dL (ref 70–99)
Potassium: 5.2 mmol/L — ABNORMAL HIGH (ref 3.5–5.1)
Sodium: 141 mmol/L (ref 135–145)

## 2019-02-15 LAB — NOVEL CORONAVIRUS, NAA (HOSP ORDER, SEND-OUT TO REF LAB; TAT 18-24 HRS): SARS-CoV-2, NAA: NOT DETECTED

## 2019-02-15 LAB — ANGIOTENSIN CONVERTING ENZYME, CSF: Angio Convert Enzyme: <1.5 U/L (ref 0.0–3.1)

## 2019-02-15 LAB — VDRL, CSF: VDRL Quant, CSF: NONREACTIVE

## 2019-02-15 NOTE — TOC Progression Note (Signed)
Transition of Care [facility]) - Progression Note    Patient Details  Name: ANDRIANNA MANALANG MRN: [id number] Date of Birth: 12/12/1955  Transition of Care [facility]) CM/SW Contact  Shade Flood, LCSW Phone Number: 02/15/2019, 1:10 PM  Clinical Narrative:     TOC following. Per MD, pt stable for dc tomorrow. Discussed SNF bed offer status with pt. She is agreeable to dc to [facility]. Spoke with Marden Noble at [facility]. They will start auth. Awaiting COVID test results. TOC will follow up tomorrow to further assist with pt's dc needs.  Expected Discharge Plan: Skilled Nursing Facility Barriers to Discharge: No Barriers Identified  Expected Discharge Plan and Services Expected Discharge Plan: [place] In-house Referral: Clinical Social Work     Living arrangements for the past 2 months: Single Family Home                                       Social Determinants of Health (SDOH) Interventions    Readmission Risk Interventions Readmission Risk Prevention Plan 02/15/2019  Transportation Screening Complete  PCP or Specialist Appt within 3-5 Days Not Complete  Not Complete comments Pt going to SNF Rehab  HRI or [place] Not Complete  HRI or Home Care Consult comments Pt going to SNF Rehab  Social Work Consult for [place] Planning/Counseling Complete  Palliative Care Screening Not Applicable  Medication Review Press photographer) Complete  Some recent data might be hidden

## 2019-02-15 NOTE — Care Management Important Message (Signed)
Important Message  Patient Details  Name: Joyce [name] MRN: [id number] Date of Birth: May 16, 1956   Medicare Important Message Given:  Yes     Tommy Medal 02/15/2019, 11:42 AM

## 2019-02-15 NOTE — Progress Notes (Signed)
PROGRESS NOTE    Joyce [name]  MVH:[id number] DOB: 1955/08/23 DOA: 02/08/2019 PCP: Lucia Gaskins, MD   Brief Narrative:  Per HPI: Joyce [name] y.o.female,w hypertension, hyperlipidemia, h/o stroke, ckd stage3, depression, chronic right leg pain, apparently presents with c/o muscle spasm, pt notes being in [facility] and doing coccaine. Pt notes that she has been taking Nsaids. Pt denies fever, chills, rash, cough, cp, palp, sob, n/v, abd pain, diarrhea, brbpr, black stool, dysuria, hematuria. Pt has not noticed any change in urine output. Pt does note that she has had poor po intake recently.  Patient was admitted for AKI on CKD stage III along with some mild rhabdomyolysis secondary to cocaine use. She continues to have ongoing, severe muscle spasms to her lower extremities with negative work-up on MRI of the thoracic and lumbar spine.She was seen by neurology on 7/8 with concerns for cervical myelopathy for which MRI of the cervical spine was performed with no findings of this noted. 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  Recommendations are for Plavix to be started after spinal tap that will be performed on 7/10.  PT has recommended SNF on discharge.  7/11: Patient has completed 3-day course of IV high-dose steroids per neurology.  Will discontinue IV fluid today.  Improved pain management with oral scheduled Percocet and IM Dilaudid as needed.  Gabapentin had been increased back to her usual home dose of 300 mg 3 times daily on 7/10.  Appreciate neurology input on 7/10 with 2D echocardiogram and carotid ultrasound pending for CVA of corpus callosum noted.  CSF study thus far within normal limits, but other studies pending and neurology will follow-up.  Neurology has  recommended 1 month follow-up upon discharge.  Awaiting SNF placement at this time.  She will also need nephrology follow-up outpatient.   Assessment & Plan:   Principal Problem:   ARF (acute renal failure) (HCC) Active Problems:   Essential hypertension, benign   Hyperlipidemia   Anemia   ARF on CKD stage 3, appears to intrinsic with Fena 1.2%-improving -ESR is 48 -Renal ultrasound with no acute findings -Creatinine stabilizing at baseline 1.9-2.0 -Continue holding nephrotoxic agents -Patient has been asked to maintain adequate oral hydration. -Follow basic metabolic panel immediately.  Mild Rhabdomyolysis likely secondary to admitted coccaine use and dehydration -TSH 4.01 -Repeat CK with improvement -Continue maintaining adequate hydration.  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  Hypertension-stable -STOP Lisinopril / hydrochlorothiazide temporarily given acute kidney injury. -Cont Amlodipine 77m po qday -Cont Labetalol 3021mpo bid -Cont Clonidine patch 0.37m86mopically q week -continue hydralazine 3 times daily for now -follow heart healthy diet   Hyperlipidemia -Continue simvastatin -Heart healthy diet recommended.  Chronic right lower ext pain -Gabapentin 300 mg 3 times a day as -Continue on Robaxin as needed -continue Percocet 10/325 every 6 hours to help with pain management -continue PRN dilaudid  Iron deficiency anemia-stable -1 dose of Feraheme given 7/7 -No overt bleeding appreciated -Repeat CBC and monitor hemoglobin trend intermittently.   DVT prophylaxis:Heparin Code Status:Full Family Communication:None at  bedside  Disposition Plan:Continue current pain management.  Close follow-up of patient's renal function (creatinine trending up again).  Continue IV fluids.  Complete a stroke work-up.  Discharge to skilled nursing facility once bed available, insurance authorization received and COVID test results back.  Consultants:  Neurology  Procedures:  Spinal tap 7/10  Antimicrobials:   None  Subjective: No fever, no chest pain, no nausea, no vomiting.  Still experiencing intermittent lower extremity pain, feeling weak and deconditioned.  Objective: Vitals:   02/14/19 2117 02/15/19 0549 02/15/19 0757 02/15/19 1329  BP: (!) 144/55 (!) 162/65  (!) 188/85  Pulse: 61 60  73  Resp: _0 Temp: 98.2 F (36.8 C) 98.5 F (36.9 C)  98.6 F (37 C)  TempSrc: Oral Oral  Oral  SpO2: 100% 100% 99% 100%  Weight:  117.7 kg    Height:        Intake/Output Summary (Last 24 hours) at 02/15/2019 1824 Last data filed at 02/15/2019 1755 Gross per 24 hour  Intake 720 ml  Output 1800 ml  Net -1080 ml   Filed Weights   02/13/19 0538 02/14/19 0522 02/15/19 0549  Weight: 115.5 kg 116.5 kg 117.7 kg    Examination: General exam: Alert, awake, oriented x 3; no fever, no chest pain, no shortness of breath.  Patient denies any nausea or vomiting.  Still weak/deconditioned and complaining of intermittent neuropathic right lower extremity pain. Respiratory system: Clear to auscultation. Respiratory effort normal. Cardiovascular system:RRR. No murmurs, rubs, gallops. Gastrointestinal system: Abdomen is nondistended, soft and nontender. No organomegaly or masses felt. Normal bowel sounds heard. Central nervous system: Alert and oriented. No new focal neurological deficits. Extremities: No cyanosis or clubbing.  Trace lower extremity edema appreciated on exam. Skin: No rashes, lesions or ulcers Psychiatry: Judgement and insight appear normal. Mood & affect appropriate.   Data Reviewed: I have personally  reviewed following labs and imaging studies  CBC: Recent Labs  Lab 02/10/19 0607 02/11/19 0529 02/12/19 0508 02/13/19 0520 02/14/19 0517  WBC 7.1 5.9 13.9* 14.1* 14.3*  HGB 10.1* 10.4* 9.6* 9.0* 9.7*  HCT 34.1* 34.0* 30.6* 28.5* 31.9*  MCV 84.8 82.9 81.2 81.0 84.2  PLT 243 241 239 235 017   Basic Metabolic Panel: Recent Labs  Lab 02/10/19 0607 02/11/19 0529 02/12/19 0508 02/13/19 0520 02/14/19 0517 02/15/19 0516  NA 142 139 137 136 139 141  K 4.4 4.7 4.6 4.6 4.2 5.2*  CL 113* 108 107 105 105 105  CO2 22 20* _1 GLUCOSE 67* 130* 141* 137* 102* 89  BUN 40* 31* 31* 37* 44* 50*  CREATININE 2.28* 1.65* 1.56* 1.54* 1.91* 2.06*  CALCIUM 8.3* 9.0 8.8* 8.4* 8.3* 8.2*  MG 2.4  --   --   --   --   --    GFR: Estimated Creatinine Clearance: 37.1 mL/min (A) (by C-G formula based on SCr of 2.06 mg/dL (H)).   Liver Function Tests: Recent Labs  Lab 02/09/19 0418  AST 24  ALT 14  ALKPHOS 82  BILITOT 0.2*  PROT 5.7*  ALBUMIN 3.1*   Cardiac Enzymes: Recent Labs  Lab 02/09/19 0418 02/10/19 0607  CKTOTAL 463* 379*  CKMB 10.1*  --    HbA1C: Recent Labs    02/13/19 0520  HGBA1C 5.9*   Lipid Profile: Recent Labs    02/13/19 0520  CHOL 237*  HDL 57  LDLCALC 165*  TRIG 76  CHOLHDL 4.2    Recent Results (  from the past 240 hour(s))  SARS Coronavirus 2 (CEPHEID - Performed in [place] hospital lab), Hosp Order     Status: None   Collection Time: 02/08/19  8:35 PM   Specimen: Nasopharyngeal Swab  Result Value Ref Range Status   SARS Coronavirus 2 NEGATIVE NEGATIVE Final    Comment: (NOTE) If result is NEGATIVE SARS-CoV-2 target nucleic acids are NOT DETECTED. The SARS-CoV-2 RNA is generally detectable in upper and lower  respiratory specimens during the acute phase of infection. The lowest  concentration of SARS-CoV-2 viral copies this assay can detect is 250  copies / mL. A negative result does not preclude SARS-CoV-2 infection  and should not be  used as the sole basis for treatment or other  patient management decisions.  A negative result may occur with  improper specimen collection / handling, submission of specimen other  than nasopharyngeal swab, presence of viral mutation(s) within the  areas targeted by this assay, and inadequate number of viral copies  (<250 copies / mL). A negative result must be combined with clinical  observations, patient history, and epidemiological information. If result is POSITIVE SARS-CoV-2 target nucleic acids are DETECTED. The SARS-CoV-2 RNA is generally detectable in upper and lower  respiratory specimens dur ing the acute phase of infection.  Positive  results are indicative of active infection with SARS-CoV-2.  Clinical  correlation with patient history and other diagnostic information is  necessary to determine patient infection status.  Positive results do  not rule out bacterial infection or co-infection with other viruses. If result is PRESUMPTIVE POSTIVE SARS-CoV-2 nucleic acids MAY BE PRESENT.   A presumptive positive result was obtained on the submitted specimen  and confirmed on repeat testing.  While 2019 novel coronavirus  (SARS-CoV-2) nucleic acids may be present in the submitted sample  additional confirmatory testing may be necessary for epidemiological  and / or clinical management purposes  to differentiate between  SARS-CoV-2 and other Sarbecovirus currently known to infect humans.  If clinically indicated additional testing with an alternate test  methodology [phone]) is advised. The SARS-CoV-2 RNA is generally  detectable in upper and lower respiratory sp ecimens during the acute  phase of infection. The expected result is Negative. Fact Sheet for Patients:  StrictlyIdeas.no Fact Sheet for Healthcare Providers: BankingDealers.co.za This test is not yet approved or cleared by the [place] FDA and has been authorized  for detection and/or diagnosis of SARS-CoV-2 by FDA under an Emergency Use Authorization (EUA).  This EUA will remain in effect (meaning this test can be used) for the duration of the COVID-19 declaration under Section 564(b)(1) of the Act, 21 U.S.C. section 360bbb-3(b)(1), unless the authorization is terminated or revoked sooner. Performed at [facility], [address] [place], [place] [address]   Novel Coronavirus,NAA,(SEND-OUT TO REF LAB - TAT 24-48 hrs); Hosp Order     Status: None   Collection Time: 02/13/19  6:23 PM   Specimen: Nasopharyngeal Swab; Respiratory  Result Value Ref Range Status   SARS-CoV-2, NAA NOT DETECTED NOT DETECTED Final    Comment: (NOTE) This test was developed and its performance characteristics determined by [employer]. This test has not been FDA cleared or approved. This test has been authorized by FDA under an Emergency Use Authorization (EUA). This test is only authorized for the duration of time the declaration that circumstances exist justifying the authorization of the emergency use of in vitro diagnostic tests for detection of SARS-CoV-2 virus and/or diagnosis of COVID-19 infection under  section 564(b)(1) of the Act, 21 U.S.C. 921BWN-7(N)(4), unless the authorization is terminated or revoked sooner. When diagnostic testing is negative, the possibility of a false negative result should be considered in the context of a patient's recent exposures and the presence of clinical signs and symptoms consistent with COVID-19. An individual without symptoms of COVID-19 and who is not shedding SARS-CoV-2 virus would expect to have a negative (not detected) result in this assay. Performed  At: [facility] [place], [name] [id number] Rush Farmer MD IO:[id number]    [place]  Final    Comment: Performed at [facility], [address], [place], [place] [address]     Radiology Studies: No  results found.  Scheduled Meds: . amLODipine  10 mg Oral Daily  . cloNIDine  0.2 mg Transdermal Weekly  . clopidogrel  75 mg Oral Q breakfast  . gabapentin  300 mg Oral TID  . hydrALAZINE  25 mg Oral Q8H  . labetalol  300 mg Oral TID  . loratadine  10 mg Oral Daily  . multivitamin with minerals  1 tablet Oral Daily  . oxyCODONE-acetaminophen  2 tablet Oral Q6H  . senna-docusate  1 tablet Oral q morning - 10a  . simvastatin  10 mg Oral Daily  . traZODone  50 mg Oral QHS   Continuous Infusions: . sodium chloride       LOS: 6 days    Time spent: 30 minutes    Barton Dubois, MD [facility] Pager [phone]  02/15/2019, 6:24 PM

## 2019-02-15 NOTE — Progress Notes (Signed)
Patient is resting in bed with eyes closed. Breathing is even and nonlabored. Patient doesn't appear to be in pain or distress at this time. Patient is running sinus bradycardia on telemetry heart monitor. Will continue to monitor throughout shift.

## 2019-02-15 NOTE — Progress Notes (Signed)
RLE cool to touch from toes to mid calf: same area of her numbness complaint. Cap refill and pedal pulses equal bilat .Dr. Dyann Kief notified through Epic message (seen at [phone]) with no new orders.

## 2019-02-16 NOTE — Progress Notes (Signed)
Physical Therapy Treatment Patient Details Name: Joyce [name] MRN: [id number] DOB: [birth year] Today's Date: 02/16/2019    History of Present Illness Joyce [name]  is a 63 y.o. female,w hypertension, hyperlipidemia, h/o stroke, ckd stage3, depression,  chronic right leg pain, apparently presents with c/o muscle spasm, pt notes being in [facility] and doing coccaine.  Pt notes that she has been taking Nsaids.  Pt denies fever, chills, rash, cough, cp, palp, sob, n/v, abd pain, diarrhea, brbpr, black stool, dysuria, hematuria.  Pt has not noticed any change in urine output.  Pt does note that she has had poor po intake recently. Patient was admitted for AKI on CKD stage III along with some mild rhabdomyolysis secondary to cocaine use.  She continues to have ongoing, severe muscle spasms to her lower extremities with negative work-up on MRI of the thoracic and lumbar spine.    PT Comments    Patient agreeable for therapy and able to ambulate to bathroom to have a bowel movement by transferring to and from commode using grab bar, had most difficulty with stand to sitting when lowered to commode due to RLE weakness.  Patient limited to ambulation in room due to c/o fatigue and declined to sit up in chair.  Patient will benefit from continued physical therapy in hospital and recommended venue below to increase strength, balance, endurance for safe ADLs and gait.   Follow Up Recommendations  SNF     Equipment Recommendations  Rolling walker with 5" wheels    Recommendations for Other Services       Precautions / Restrictions Precautions Precautions: Fall Restrictions Weight Bearing Restrictions: No    Mobility  Bed Mobility Overal bed mobility: Needs Assistance Bed Mobility: Supine to Sit;Sit to Supine     Supine to sit: Min assist Sit to supine: Min assist   General bed mobility comments: labored movment with assistance to move RLE  Transfers Overall transfer level: Needs  assistance Equipment used: Rolling walker (2 wheeled) Transfers: Sit to/from [facility] Sit to Stand: Min assist Stand pivot transfers: Min guard       General transfer comment: difficulty for sit to stands dur RLE weakness, good return for using grab bar for transferring to and from commode in bathroom  Ambulation/Gait Ambulation/Gait assistance: Min assist;Mod assist Gait Distance (Feet): 16 Feet Assistive device: Rolling walker (2 wheeled) Gait Pattern/deviations: Decreased step length - right;Decreased stance time - right;Decreased step length - left;Decreased stride length Gait velocity: decreased   General Gait Details: requires tactile assistance to move RLE when taking steps, patient able to use RUE to help advance RLE during gait training   Stairs             Wheelchair Mobility    Modified Rankin (Stroke Patients Only)       Balance Overall balance assessment: Needs assistance Sitting-balance support: Feet supported;No upper extremity supported Sitting balance-Leahy Scale: Fair     Standing balance support: Bilateral upper extremity supported;During functional activity Standing balance-Leahy Scale: Fair Standing balance comment: using RW and mostly upper body strength to maintain standing balance                            Cognition Arousal/Alertness: Awake/alert Behavior During Therapy: WFL for tasks assessed/performed Overall Cognitive Status: Within Functional Limits for tasks assessed  Exercises      General Comments        Pertinent Vitals/Pain Pain Score: 8  Pain Location: left shoulder to neck, low back, right hip to pelvis and tingling in LLE Pain Descriptors / Indicators: Tingling;Aching;Sore Pain Intervention(s): Limited activity within patient's tolerance;Monitored during session    Home Living                      Prior Function             PT Goals (current goals can now be found in the care plan section) Acute Rehab PT Goals Patient Stated Goal: increase strength and stability PT Goal Formulation: With patient Time For Goal Achievement: 02/24/19 Potential to Achieve Goals: Good Progress towards PT goals: Progressing toward goals    Frequency    Min 5X/week      PT Plan Current plan remains appropriate    Co-evaluation              AM-PAC PT "6 Clicks" Mobility   Outcome Measure  Help needed turning from your back to your side while in a flat bed without using bedrails?: A Little Help needed moving from lying on your back to sitting on the side of a flat bed without using bedrails?: A Little Help needed moving to and from a bed to a chair (including a wheelchair)?: A Little Help needed standing up from a chair using your arms (e.g., wheelchair or bedside chair)?: A Little Help needed to walk in hospital room?: A Lot Help needed climbing 3-5 steps with a railing? : Total 6 Click Score: 15    End of Session   Activity Tolerance: Patient tolerated treatment well;Patient limited by fatigue;Patient limited by pain Patient left: in bed;with call bell/phone within reach Nurse Communication: Mobility status PT Visit Diagnosis: Unsteadiness on feet (R26.81);Other abnormalities of gait and mobility (R26.89);Muscle weakness (generalized) (M62.81)     Time: 6387-5643 PT Time Calculation (min) (ACUTE ONLY): 35 min  Charges:  $Therapeutic Exercise: 8-22 mins $Therapeutic Activity: 8-22 mins                     3:04 PM, 02/16/19 Lonell Grandchild, MPT Physical Therapist with [facility] [phone] office [phone] mobile phone

## 2019-02-16 NOTE — Progress Notes (Signed)
PROGRESS NOTE    [name]  ZHY:[id number] DOB: [birth year] DOA: 02/08/2019 PCP: Lucia Gaskins, MD   Brief Narrative:  Per HPI: Joyce [name] y.o.female,w hypertension, hyperlipidemia, h/o stroke, ckd stage3, depression, chronic right leg pain, apparently presents with c/o muscle spasm, pt notes being in [facility] and doing coccaine. Pt notes that she has been taking Nsaids. Pt denies fever, chills, rash, cough, cp, palp, sob, n/v, abd pain, diarrhea, brbpr, black stool, dysuria, hematuria. Pt has not noticed any change in urine output. Pt does note that she has had poor po intake recently.  Patient was admitted for AKI on CKD stage III along with some mild rhabdomyolysis secondary to cocaine use. She continues to have ongoing, severe muscle spasms to her lower extremities with negative work-up on MRI of the thoracic and lumbar spine.She was seen by neurology on 7/8 with concerns for cervical myelopathy for which MRI of the cervical spine was performed with no findings of this noted. 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  Recommendations are for Plavix to be started after spinal tap that will be performed on 7/10.  PT has recommended SNF on discharge.  7/11: Patient has completed 3-day course of IV high-dose steroids per neurology.  Will discontinue IV fluid today.  Improved pain management with oral scheduled Percocet and IM Dilaudid as needed.  Gabapentin had been increased back to her usual home dose of 300 mg 3 times daily on 7/10.  Appreciate neurology input on 7/10 with 2D echocardiogram and carotid ultrasound pending for CVA of corpus callosum noted.  CSF study thus far within normal limits, but other studies pending and neurology will follow-up.  Neurology has  recommended 1 month follow-up upon discharge.  Awaiting SNF placement at this time.  She will also need nephrology follow-up outpatient.   Assessment & Plan:   Principal Problem:   ARF (acute renal failure) (HCC) Active Problems:   Essential hypertension, benign   Hyperlipidemia   Anemia   ARF on CKD stage 3, appears to intrinsic with Fena 1.2%-improving -ESR is 48 -Renal ultrasound with no acute findings -Creatinine stabilizing at baseline 1.9-2.0 -Continue holding nephrotoxic agents -Patient has been asked to maintain adequate oral hydration. -Follow basic metabolic panel immediately.  Mild Rhabdomyolysis likely secondary to admitted coccaine use and dehydration -TSH 4.01 -Repeat CK with improvement -Continue maintaining adequate hydration.  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 -continue Plavix as ordered by neurology -PT evaluation reveals need for SNF on discharge rehabilitation and conditioning.  Hypertension-stable -STOP Lisinopril / hydrochlorothiazide temporarily given acute kidney injury. -Cont Amlodipine '10mg'$  po qday -Cont Labetalol '300mg'$  po bid -Cont Clonidine patch 0.'2mg'$  Topically q week -continue hydralazine 3 times daily for now -follow heart healthy diet   Hyperlipidemia -Continue simvastatin -Heart healthy diet recommended.  Chronic right lower ext pain -Gabapentin 300 mg 3 times a day as -Continue on Robaxin as needed -continue Percocet 10/325 every 6 hours to help with pain management -continue PRN dilaudid  Iron deficiency anemia-stable -1 dose of Feraheme given 7/7 -No overt bleeding appreciated -Repeat CBC and monitor hemoglobin trend intermittently.   DVT prophylaxis:Heparin Code Status:Full  Family  Communication:None at bedside Disposition Plan:Continue current pain management.  Close follow-up of patient's renal function (creatinine trending up again).  Continue IV fluids.  Complete a stroke work-up.  Discharge to skilled nursing facility once bed available and insurance authorization received.  COVID test results back and negative..  Consultants:  Neurology  Procedures:  Spinal tap 7/10  Antimicrobials:   None  Subjective: Still feeling weak and deconditioned; experiencing intermittent lower extremity pain (more right lower extremity).  No chest pain, no shortness of breath, no nausea, no vomiting.  Objective: Vitals:   02/16/19 0500 02/16/19 0547 02/16/19 0830 02/16/19 1433  BP:  (!) 156/70  (!) 188/54  Pulse:  (!) 57 60 63  Resp:  16  16  Temp:  98.3 F (36.8 C)  98.2 F (36.8 C)  TempSrc:  Oral  Oral  SpO2:  99%  100%  Weight: 122.6 kg     Height:        Intake/Output Summary (Last 24 hours) at 02/16/2019 1808 Last data filed at 02/16/2019 1300 Gross per 24 hour  Intake 480 ml  Output 1700 ml  Net -1220 ml   Filed Weights   02/14/19 0522 02/15/19 0549 02/16/19 0500  Weight: 116.5 kg 117.7 kg 122.6 kg    Examination: General exam: Alert, awake, oriented x 3; no fever, no chest pain, no shortness of breath.  Denies any nausea vomiting.  Reports feeling weak and deconditioned and complaining of intermittent neuropathic right lower extremity pain.  She has been able to tolerate more physical activity. Respiratory system: Clear to auscultation. Respiratory effort normal. Cardiovascular system:RRR. No murmurs, rubs, gallops. Gastrointestinal system: Abdomen is nondistended, soft and nontender. No organomegaly or masses felt. Normal bowel sounds heard. Central nervous system: Alert and oriented. No focal neurological deficits. Extremities: No cyanosis or clubbing appreciated on exam.  Positive trace edema bilaterally. Skin: No rashes, lesions or ulcers  Psychiatry: Judgement and insight appear normal. Mood & affect appropriate.    Data Reviewed: I have personally reviewed following labs and imaging studies  CBC: Recent Labs  Lab 02/10/19 0607 02/11/19 0529 02/12/19 0508 02/13/19 0520 02/14/19 0517  WBC 7.1 5.9 13.9* 14.1* 14.3*  HGB 10.1* 10.4* 9.6* 9.0* 9.7*  HCT 34.1* 34.0* 30.6* 28.5* 31.9*  MCV 84.8 82.9 81.2 81.0 84.2  PLT 243 241 239 235 644   Basic Metabolic Panel: Recent Labs  Lab 02/10/19 0607 02/11/19 0529 02/12/19 0508 02/13/19 0520 02/14/19 0517 02/15/19 0516  NA 142 139 137 136 139 141  K 4.4 4.7 4.6 4.6 4.2 5.2*  CL 113* 108 107 105 105 105  CO2 22 20* '23 23 25 27  '$ GLUCOSE 67* 130* 141* 137* 102* 89  BUN 40* 31* 31* 37* 44* 50*  CREATININE 2.28* 1.65* 1.56* 1.54* 1.91* 2.06*  CALCIUM 8.3* 9.0 8.8* 8.4* 8.3* 8.2*  MG 2.4  --   --   --   --   --    GFR: Estimated Creatinine Clearance: 37.9 mL/min (A) (by C-G formula based on SCr of 2.06 mg/dL (H)).   Cardiac Enzymes: Recent Labs  Lab 02/10/19 0607  CKTOTAL 379*    Recent Results (from the past 240 hour(s))  SARS Coronavirus 2 (CEPHEID - Performed in [facility] hospital lab), Hosp Order     Status: None   Collection Time: 02/08/19  8:35 PM   Specimen: Nasopharyngeal Swab  Result Value Ref Range Status   SARS Coronavirus 2 NEGATIVE NEGATIVE Final    Comment: (NOTE)  If result is NEGATIVE SARS-CoV-2 target nucleic acids are NOT DETECTED. The SARS-CoV-2 RNA is generally detectable in upper and lower  respiratory specimens during the acute phase of infection. The lowest  concentration of SARS-CoV-2 viral copies this assay can detect is 250  copies / mL. A negative result does not preclude SARS-CoV-2 infection  and should not be used as the sole basis for treatment or other  patient management decisions.  A negative result may occur with  improper specimen collection / handling, submission of specimen other  than nasopharyngeal swab, presence of  viral mutation(s) within the  areas targeted by this assay, and inadequate number of viral copies  (<250 copies / mL). A negative result must be combined with clinical  observations, patient history, and epidemiological information. If result is POSITIVE SARS-CoV-2 target nucleic acids are DETECTED. The SARS-CoV-2 RNA is generally detectable in upper and lower  respiratory specimens dur ing the acute phase of infection.  Positive  results are indicative of active infection with SARS-CoV-2.  Clinical  correlation with patient history and other diagnostic information is  necessary to determine patient infection status.  Positive results do  not rule out bacterial infection or co-infection with other viruses. If result is PRESUMPTIVE POSTIVE SARS-CoV-2 nucleic acids MAY BE PRESENT.   A presumptive positive result was obtained on the submitted specimen  and confirmed on repeat testing.  While 2019 novel coronavirus  (SARS-CoV-2) nucleic acids may be present in the submitted sample  additional confirmatory testing may be necessary for epidemiological  and / or clinical management purposes  to differentiate between  SARS-CoV-2 and other Sarbecovirus currently known to infect humans.  If clinically indicated additional testing with an alternate test  methodology [phone]) is advised. The SARS-CoV-2 RNA is generally  detectable in upper and lower respiratory sp ecimens during the acute  phase of infection. The expected result is Negative. Fact Sheet for Patients:  StrictlyIdeas.no Fact Sheet for Healthcare Providers: BankingDealers.co.za This test is not yet approved or cleared by the [place] FDA and has been authorized for detection and/or diagnosis of SARS-CoV-2 by FDA under an Emergency Use Authorization (EUA).  This EUA will remain in effect (meaning this test can be used) for the duration of the COVID-19 declaration under Section  564(b)(1) of the Act, 21 U.S.C. section 360bbb-3(b)(1), unless the authorization is terminated or revoked sooner. Performed at [facility], [address] [place], [place] [address]   Novel Coronavirus,NAA,(SEND-OUT TO REF LAB - TAT 24-48 hrs); Hosp Order     Status: None   Collection Time: 02/13/19  6:23 PM   Specimen: Nasopharyngeal Swab; Respiratory  Result Value Ref Range Status   SARS-CoV-2, NAA NOT DETECTED NOT DETECTED Final    Comment: (NOTE) This test was developed and its performance characteristics determined by [employer]. This test has not been FDA cleared or approved. This test has been authorized by FDA under an Emergency Use Authorization (EUA). This test is only authorized for the duration of time the declaration that circumstances exist justifying the authorization of the emergency use of in vitro diagnostic tests for detection of SARS-CoV-2 virus and/or diagnosis of COVID-19 infection under section 564(b)(1) of the Act, 21 U.S.C. 644IHK-7(Q)(2), unless the authorization is terminated or revoked sooner. When diagnostic testing is negative, the possibility of a false negative result should be considered in the context of a patient's recent exposures and the presence of clinical signs and symptoms consistent with COVID-19. An individual without symptoms of COVID-19 and  who is not shedding SARS-CoV-2 virus would expect to have a negative (not detected) result in this assay. Performed  At: [facility] [place], [name] [id number] Rush Farmer MD DP:[id number]    [place]  Final    Comment: Performed at [facility], [address], [place], [place] [address]     Radiology Studies: No results found.  Scheduled Meds: . amLODipine  10 mg Oral Daily  . cloNIDine  0.2 mg Transdermal Weekly  . clopidogrel  75 mg Oral Q breakfast  . gabapentin  300 mg Oral TID  . hydrALAZINE  25 mg Oral Q8H  . labetalol   300 mg Oral TID  . loratadine  10 mg Oral Daily  . multivitamin with minerals  1 tablet Oral Daily  . oxyCODONE-acetaminophen  2 tablet Oral Q6H  . senna-docusate  1 tablet Oral q morning - 10a  . simvastatin  10 mg Oral Daily  . traZODone  50 mg Oral QHS   Continuous Infusions: . sodium chloride       LOS: 7 days    Time spent: 30 minutes    Barton Dubois, MD [facility] Pager [phone]  02/16/2019, 6:08 PM

## 2019-02-17 DIAGNOSIS — E782 Mixed hyperlipidemia: Secondary | ICD-10-CM

## 2019-02-17 DIAGNOSIS — I639 Cerebral infarction, unspecified: Secondary | ICD-10-CM

## 2019-02-17 DIAGNOSIS — N183 Chronic kidney disease, stage 3 unspecified: Secondary | ICD-10-CM

## 2019-02-17 DIAGNOSIS — N179 Acute kidney failure, unspecified: Secondary | ICD-10-CM

## 2019-02-17 DIAGNOSIS — F1410 Cocaine abuse, uncomplicated: Secondary | ICD-10-CM

## 2019-02-17 LAB — BASIC METABOLIC PANEL
Anion gap: 7 (ref 5–15)
BUN: 33 mg/dL — ABNORMAL HIGH (ref 8–23)
CO2: 25 mmol/L (ref 22–32)
Calcium: 8 mg/dL — ABNORMAL LOW (ref 8.9–10.3)
Chloride: 105 mmol/L (ref 98–111)
Creatinine, Ser: 1.5 mg/dL — ABNORMAL HIGH (ref 0.44–1.00)
GFR calc Af Amer: 43 mL/min — ABNORMAL LOW (ref >60)
GFR calc non Af Amer: 37 mL/min — ABNORMAL LOW (ref >60)
Glucose, Bld: 106 mg/dL — ABNORMAL HIGH (ref 70–99)
Potassium: 4.8 mmol/L (ref 3.5–5.1)
Sodium: 137 mmol/L (ref 135–145)

## 2019-02-17 MED ORDER — OXYCODONE-ACETAMINOPHEN 5-325 MG PO TABS: 1.0000 | ORAL_TABLET | Freq: Two times a day (BID) | ORAL | 0 refills | Status: DC | PRN

## 2019-02-17 MED ORDER — CLOPIDOGREL BISULFATE 75 MG PO TABS: 75.0000 mg | ORAL_TABLET | Freq: Every day | ORAL | 1 refills | Status: DC

## 2019-02-17 MED ORDER — HYDRALAZINE HCL 25 MG PO TABS
50.0000 mg | ORAL_TABLET | Freq: Three times a day (TID) | ORAL | Status: DC
  Filled 2019-02-17: qty 2

## 2019-02-17 MED ORDER — HYDRALAZINE HCL 50 MG PO TABS: 50.0000 mg | ORAL_TABLET | Freq: Three times a day (TID) | ORAL | 1 refills | Status: DC

## 2019-02-17 NOTE — Care Management Important Message (Signed)
Important Message  Patient Details  Name: Joyce [name] MRN: [id number] Date of Birth: 1955-11-02   Medicare Important Message Given:  Yes     Tommy Medal 02/17/2019, 11:18 AM

## 2019-02-17 NOTE — TOC Transition Note (Signed)
Transition of Care [facility]) - CM/SW Discharge Note   Patient Details  Name: Joyce [name] MRN: [id number] Date of Birth: 01/29/1956  [facility] CM/SW Contact:  Ihor Gully, LCSW Phone Number: 02/17/2019, 4:05 PM   Clinical Narrative:    Discharge clinicals sent to facility. Doug advised of upcoming appointment on discharge paperwork with spine specialist in [place].  LCSW signing off.      Barriers to Discharge: No Barriers Identified   Patient Goals and CMS Choice Patient states their goals for this hospitalization and ongoing recovery are:: To get stronger and return home. CMS Medicare.gov Compare Post Acute Care list provided to:: Patient Choice offered to / list presented to : Patient  Discharge Placement PASRR number recieved: 02/12/19            Patient chooses bed at: [facility] Patient to be transferred to facility by: [facility], RN to have nursing secretary to call transport when ready. Name of family member notified: Husband, Nathaneil Canary Patient and family notified of of transfer: 02/17/19  Discharge Plan and Services In-house Referral: Clinical Social Work                                   Social Determinants of Health (SDOH) Interventions     Readmission Risk Interventions Readmission Risk Prevention Plan 02/15/2019  Transportation Screening Complete  PCP or Specialist Appt within 3-5 Days Not Complete  Not Complete comments Pt going to SNF Rehab  HRI or [place] Not Complete  [place] or Home Care Consult comments Pt going to SNF Rehab  Social Work Consult for [place] Planning/Counseling Complete  Palliative Care Screening Not Applicable  Medication Review Press photographer) Complete  Some recent data might be hidden

## 2019-02-17 NOTE — Discharge Summary (Signed)
Physician Discharge Summary  Joyce [name] RSW:[id number] DOB: 1956/04/01 DOA: 02/08/2019  PCP: Lucia Gaskins, MD  Admit date: 02/08/2019 Discharge date: 02/17/2019  Admitted From: Home Disposition:  SNF  Recommendations for Outpatient Follow-up:  1. Follow up with PCP in 1-2 weeks 2. Please obtain BMP/CBC in one week 3. Please transport patient to see Dr. Phylliss Bob [facility])--arrival time at 10:30 AM on 02/19/19      Discharge Condition: Stable CODE STATUS: FULL Diet recommendation: Heart Healthy   Brief/Interim Summary: 63 y.o.female,w hypertension, hyperlipidemia, h/o stroke, ckd stage3, depression, chronic right leg pain, chronic back pain, apparently presents with c/o muscle spasm, pt notes being in [facility] and doing coccaine. Pt notes that she has been taking Nsaids. Pt denies fever, chills, rash, cough, cp, palp, sob, n/v, abd pain, diarrhea, brbpr, black stool, dysuria, hematuria. Pt has not noticed any change in urine output. Pt does note that she has had poor po intake recently.  Patient was admitted for AKI on CKD stage III along with some mild rhabdomyolysis secondary to cocaine use. She continues to have ongoing, severe muscle spasms to her lower extremities with negative work-up on MRI of the thoracic and lumbar spine.She was seen by neurology on 7/8 with concerns for cervical myelopathy for which MRI of the cervical spine was performed with no findings of this noted. 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 Recommendations are for Plavix to be started after spinal tap that will be performed on 7/10. PT has recommended SNF on discharge.  7/11: Patient has completed 3-day course of IV high-dose steroids per neurology.  Will  discontinue IV fluid today.  Improved pain management with oral scheduled Percocet and IM Dilaudid as needed.  Gabapentin had been increased back to her usual home dose of 300 mg 3 times daily on 7/10.  Appreciate neurology input on 7/10 with 2D echocardiogram and carotid ultrasound pending for CVA of corpus callosum noted.  CSF study thus far within normal limits, but other studies pending and neurology will follow-up.  Neurology has recommended 1 month follow-up upon discharge.  Awaiting SNF placement at this time.  Discharge Diagnoses:   AKI on CKD stage 3, appears to intrinsic with Fena 1.2%-improving -ESR is 48 -Renal ultrasound with no acute findings -Creatinine stabilizing at baseline 1.6-1.9 -Continue holding nephrotoxic agents--discontinue ACEi/HCTZ -Patient has been asked to maintain adequate oral hydration. 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 -continue Plavix as ordered by neurology -PT evaluation reveals need for SNF on discharge rehabilitation and conditioning. -pt has some nonphysiologic complaints and findings on exam -she c/o right leg pain at same time she complains of no sensation in RLE -7/11 carotid US--no hemodynamically significant stenosis -7/11 Echo--EF >65%, no thrombi -B12 309 -folate 1173  Spinal Stenosis of L2/Right Leg weakness -02/08/19 MR L-spine = severe canal stenosis L2 -Dr. Phylliss Bob previously performed spinal fusion L3-4 -I have spoken with his  office and  scheduled a follow up appointment with him on 02/19/19 at 10:30 AM  Hypertension-stable -STOP Lisinopril / hydrochlorothiazide temporarily given acute kidney injury. -Cont Amlodipine '10mg'$  po qday -Cont Labetalol '300mg'$  po bid -Cont Clonidine patch 0.'2mg'$  Topically q week -continue hydralazine 3 times dail--increase to 50 mg tid -follow heart healthy diet   Hyperlipidemia -Continue simvastatin -Heart healthy diet recommended.  Chronic right lower ext pain/Chronic pain symdrome -Gabapentin 300 mg 3 times a day  -receives percocet 10/325 #90 from Dr. Cindie Laroche monthly -PMP AWARE--queried -Continue on Robaxin as needed -continue Percocet 10/325 every 6 hours to help with pain management -continue PRN dilaudid -7/6 RLE duplex--neg DVT  Anemia of chronic disease-stable -1 dose of Feraheme given 7/7 -No overt bleeding appreciated -iron saturation 6%; ferritin 35 -B12--309 -RBC folate 1173  Hyperkalemia -resolved with IVF  Discharge Instructions   Allergies as of 02/17/2019      Reactions   Lactose Intolerance (gi) Nausea And Vomiting   Aspirin Hives   Other Hives   Ivory soap      Medication List    STOP taking these medications   lisinopril-hydrochlorothiazide 20-12.5 MG tablet Commonly known as: ZESTORETIC   predniSONE 10 MG (21) Tbpk tablet Commonly known as: STERAPRED UNI-PAK 21 TAB   traMADol 50 MG tablet Commonly known as: ULTRAM     TAKE these medications   amLODipine 10 MG tablet Commonly known as: NORVASC Take 10 mg by mouth daily.   cetirizine 10 MG tablet Commonly known as: ZYRTEC Take 10 mg by mouth daily.   cloNIDine 0.2 mg/24hr patch Commonly known as: CATAPRES - Dosed in mg/24 hr Place 1 patch (0.2 mg total) onto the skin once a week.   clopidogrel 75 MG tablet Commonly known as: PLAVIX Take 1 tablet (75 mg total) by mouth daily with breakfast. Start taking on: February 18, 2019   EYE DROPS IRRITATION RELIEF OP Apply 1 drop to eye 2  (two) times daily as needed (allergies/irritation).   gabapentin 300 MG capsule Commonly known as: NEURONTIN Take 1 capsule (300 mg total) by mouth 3 (three) times daily. 1 tablet in BID and 2 at bedtime What changed: additional instructions   HM SALONPAS PAIN RELIEF EX Apply 1 patch topically daily as needed (pain).   hydrALAZINE 50 MG tablet Commonly known as: APRESOLINE Take 1 tablet (50 mg total) by mouth every 8 (eight) hours.   labetalol 300 MG tablet Commonly known as: NORMODYNE Take 1 tablet (300 mg total) by mouth 3 (three) times daily.   multivitamin with minerals Tabs tablet Take 1 tablet by mouth daily.   oxyCODONE-acetaminophen 5-325 MG tablet Commonly known as: Percocet Take 1-2 tablets by mouth 2 (two) times daily as needed for severe pain. What changed: reasons to take this   oxymetazoline 0.05 % nasal spray Commonly known as: AFRIN Place 1 spray into both nostrils 2 (two) times daily as needed for congestion.   simvastatin 40 MG tablet Commonly known as: ZOCOR Take 40 mg by mouth daily.   traZODone 50 MG tablet Commonly known as: DESYREL Take 1 tablet by mouth at bedtime.      Contact information for after-discharge care    [facility] .   Service: Skilled Nursing Contact information: [address] [address] [phone]             Allergies  Allergen Reactions   Lactose Intolerance (Gi) Nausea And Vomiting   Aspirin  Hives   Other Hives    Ivory soap    Consultations:  neurology   Procedures/Studies: Mr Brain Wo Contrast  Result Date: 02/11/2019 CLINICAL DATA:  Headache, weakness and difficulty walking over the last 3 days. EXAM: MRI HEAD WITHOUT CONTRAST TECHNIQUE: Multiplanar, multiecho pulse sequences of the brain and surrounding structures were obtained without intravenous contrast. COMPARISON:  01/22/2018 FINDINGS: Brain: The brainstem is normal. There is an  old small vessel infarction in the left cerebellum. Cerebral hemispheres show moderate changes of chronic small vessel disease affecting the thalami, basal ganglia and hemispheric deep white matter. There is a newly seen 8 mm focus of restricted diffusion in the right splenium of the corpus callosum. This could represent a small vessel infarction or a focus of demyelination. No large vessel territory infarction. No evidence of neoplastic mass lesion. After contrast administration, there is a 4 mm focus of enhancement in the right posterior parietal subcortical white matter. 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 No prior contrast enhanced exam. Vascular: Major vessels at the base of the brain show flow. Skull and upper cervical spine: Negative Sinuses/Orbits: Clear/normal Other: None IMPRESSION: Newly seen 8 mm focus of restricted diffusion in the right splenium of the corpus callosum. This could be a subacute infarction or a focus of demyelination. Elsewhere, there chronic small-vessel ischemic changes affecting the left cerebellum, both thalami and basal ganglia and the cerebral hemispheric white matter. It would seem that small-vessel disease is the most likely diagnosis, particularly given the history of hypertension and hyperlipidemia. 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 Electronically Signed   By: Nelson Chimes M.D.   On: 02/11/2019 13:47   Mr Cervical Spine Wo Contrast  Result Date: 02/11/2019 CLINICAL DATA:  Episodic spasms of the right lower extremity. Severe weakness of the right lower extremity. Fecal incontinence. Anesthesia of the right foot and distal leg. Symptomatology consistent with cervical myelopathy. EXAM: MRI CERVICAL SPINE WITHOUT CONTRAST TECHNIQUE: Multiplanar, multisequence MR imaging of the cervical  spine was performed. No intravenous contrast was administered. COMPARISON:  CT scan of the cervical spine dated 03/05/2018 and cervical MRI dated 01/23/2018 FINDINGS: Alignment: Physiologic. Vertebrae: No fracture, evidence of discitis, or bone lesion. Cord: Normal signal and morphology. Posterior Fossa, vertebral arteries, paraspinal tissues: Negative. Disc levels: C2-3: Negative. C3-4: Small central subligamentous disc protrusion slightly asymmetric to the right touching the ventral aspect of the spinal cord but without focal neural impingement. C4-5: Small broad-based soft disc protrusion without neural impingement. C5-6: Tiny broad-based disc bulge with no neural impingement. Uncinate spurs slightly narrow the right neural foramen, unchanged. C6-7: Small broad-based disc bulge without neural impingement. Uncinate spurs narrow the left neural foramen, unchanged. IMPRESSION: 1. No discrete cervical myelopathy. 2. Multilevel degenerative disc disease with small disc protrusions at C3-4 and C4-5 without focal neural impingement. Three slight right foraminal narrowing at C5-6 and slight left foraminal narrowing at C6-7 due to uncinate spurs, unchanged. Electronically Signed   By: Lorriane Shire M.D.   On: 02/11/2019 09:45   Mr Thoracic Spine Wo Contrast  Result Date: 02/08/2019 CLINICAL DATA:  Initial evaluation for back pain, chronic right lower extremity pain. EXAM: MRI THORACIC AND LUMBAR SPINE WITHOUT CONTRAST TECHNIQUE: Multiplanar and multiecho pulse sequences of the thoracic and lumbar spine were obtained without intravenous contrast. COMPARISON:  Prior MRI from 12/29/2015. FINDINGS: MRI  THORACIC SPINE FINDINGS Alignment: Trace dextroscoliosis. Alignment otherwise normal with preservation of the normal thoracic kyphosis. No listhesis or subluxation. Vertebrae: Vertebral body height maintained without evidence for acute or chronic fracture. Underlying bone marrow signal intensity within normal limits. Few  scattered benign hemangiomas noted within the T9 and T10 vertebral bodies. 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 No other discrete or worrisome osseous lesions. No other abnormal marrow edema. Cord: Signal intensity within the thoracic spinal cord is normal. Normal cord caliber morphology. Paraspinal and other soft tissues: Unremarkable. Disc levels: T1-2:  Unremarkable. T2-3: Left-sided facet hypertrophy.  No stenosis. T3-4: Small central disc protrusion indents the ventral thecal sac. Mild facet hypertrophy. No significant stenosis or cord deformity. T4-5:  Mild right greater than left facet hypertrophy.  No stenosis. T5-6: Minimal disc bulge. Mild right greater than left facet hypertrophy. No spinal stenosis. Mild right foraminal narrowing. T6-7: Diffuse disc bulge. Mild bilateral facet hypertrophy. Perineural cyst at the left neural foramen. No significant stenosis. T7-8: Diffuse disc bulge with superimposed small central disc protrusion indents the ventral thecal sac. Mild bilateral facet hypertrophy. Thecal sac remains patent. No significant cord deformity. Foramina remain patent. T8-9: Diffuse disc bulge with mild bilateral facet hypertrophy. No significant stenosis. T9-10: Mild diffuse disc bulge. Mild to moderate facet hypertrophy. No significant stenosis. T10-11: Diffuse disc bulge. Moderate left greater than right facet hypertrophy. Thecal sac remains patent. Mild bilateral foraminal narrowing. T11-12: Negative interspace. Moderate to advanced left greater than right facet hypertrophy. Thecal sac remains patent. Moderate left with mild right foraminal narrowing. T12-L1: Negative interspace. Moderate facet hypertrophy. No significant stenosis. MRI LUMBAR SPINE FINDINGS Segmentation: Examination mildly limited as the patient was unable to tolerate the full length of the exam. STIR sequence was not performed. Standard.  Lowest well-formed disc labeled the L5-S1 level. Alignment: 4 mm  retrolisthesis of L2 on L3, progressed from previous. Alignment otherwise normal with preservation of the normal lumbar lordosis. Vertebrae: Susceptibility artifact from prior fusion at L3 through S1. Vertebral body height maintained without evidence for acute or chronic fracture. Underlying bone marrow signal intensity within normal limits. 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 No other discrete or worrisome osseous lesions. Progressive reactive endplate changes noted at the inferior endplate of L2. Conus medullaris and cauda equina: Conus extends to the L1 level. Conus and cauda equina appear normal. Paraspinal and other soft tissues: Few scattered subcentimeter T2 hyperintense right renal cyst noted. Visualized visceral structures otherwise unremarkable. Disc levels: L1-2: Negative interspace. Mild to moderate bilateral facet hypertrophy. No significant canal or foraminal stenosis. L2-3: Progressive 4 mm retrolisthesis. Intervertebral disc space narrowing with diffuse disc bulge and disc desiccation. Reactive endplate changes with marginal endplate osteophytic spurring. Disc bulging asymmetric to the right. Moderate facet and ligament flavum hypertrophy. Resultant severe canal with moderate right worse than left L2 foraminal stenosis. L3-4: Prior posterior and interbody fusion. No significant residual spinal stenosis. Foramina are patent. L4-5: Prior fusion. No residual or recurrent canal or foraminal stenosis. L5-S1: Prior fusion with posterior decompression. No residual canal or foraminal stenosis. IMPRESSION: MR THORACIC SPINE IMPRESSION 1. No acute abnormality within the thoracic spine. No evidence for cord compression or severe spinal stenosis. 2. Multilevel degenerative disc bulging with facet hypertrophy with resultant mild to moderate bilateral foraminal narrowing at T5-6, T10-11, and T11-12 as above. 3. 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 MR LUMBAR SPINE IMPRESSION 1. No  acute abnormality within the lumbar spine. No evidence for cord compression. 2.  Prior fusion at L3-4 through L5-S1 without residual or recurrent stenosis. 3. 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 Electronically Signed   By: Jeannine Boga M.D.   On: 02/08/2019 19:10   Mr Lumbar Spine Wo Contrast  Result Date: 02/08/2019 CLINICAL DATA:  Initial evaluation for back pain, chronic right lower extremity pain. EXAM: MRI THORACIC AND LUMBAR SPINE WITHOUT CONTRAST TECHNIQUE: Multiplanar and multiecho pulse sequences of the thoracic and lumbar spine were obtained without intravenous contrast. COMPARISON:  Prior MRI from 12/29/2015. FINDINGS: MRI THORACIC SPINE FINDINGS Alignment: Trace dextroscoliosis. Alignment otherwise normal with preservation of the normal thoracic kyphosis. No listhesis or subluxation. Vertebrae: Vertebral body height maintained without evidence for acute or chronic fracture. Underlying bone marrow signal intensity within normal limits. Few scattered benign hemangiomas noted within the T9 and T10 vertebral bodies. 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 No other discrete or worrisome osseous lesions. No other abnormal marrow edema. Cord: Signal intensity within the thoracic spinal cord is normal. Normal cord caliber morphology. Paraspinal and other soft tissues: Unremarkable. Disc levels: T1-2:  Unremarkable. T2-3: Left-sided facet hypertrophy.  No stenosis. T3-4: Small central disc protrusion indents the ventral thecal sac. Mild facet hypertrophy. No significant stenosis or cord deformity. T4-5:  Mild right greater than left facet hypertrophy.  No stenosis. T5-6: Minimal disc bulge. Mild right greater than left facet hypertrophy. No spinal stenosis. Mild right foraminal narrowing. T6-7: Diffuse disc bulge. Mild bilateral facet hypertrophy. Perineural cyst at the left neural foramen. No  significant stenosis. T7-8: Diffuse disc bulge with superimposed small central disc protrusion indents the ventral thecal sac. Mild bilateral facet hypertrophy. Thecal sac remains patent. No significant cord deformity. Foramina remain patent. T8-9: Diffuse disc bulge with mild bilateral facet hypertrophy. No significant stenosis. T9-10: Mild diffuse disc bulge. Mild to moderate facet hypertrophy. No significant stenosis. T10-11: Diffuse disc bulge. Moderate left greater than right facet hypertrophy. Thecal sac remains patent. Mild bilateral foraminal narrowing. T11-12: Negative interspace. Moderate to advanced left greater than right facet hypertrophy. Thecal sac remains patent. Moderate left with mild right foraminal narrowing. T12-L1: Negative interspace. Moderate facet hypertrophy. No significant stenosis. MRI LUMBAR SPINE FINDINGS Segmentation: Examination mildly limited as the patient was unable to tolerate the full length of the exam. STIR sequence was not performed. Standard.  Lowest well-formed disc labeled the L5-S1 level. Alignment: 4 mm retrolisthesis of L2 on L3, progressed from previous. Alignment otherwise normal with preservation of the normal lumbar lordosis. Vertebrae: Susceptibility artifact from prior fusion at L3 through S1. Vertebral body height maintained without evidence for acute or chronic fracture. Underlying bone marrow signal intensity within normal limits. 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 No other discrete or worrisome osseous lesions. Progressive reactive endplate changes noted at the inferior endplate of L2. Conus medullaris and cauda equina: Conus extends to the L1 level. Conus and cauda equina appear normal. Paraspinal and other soft tissues: Few scattered subcentimeter T2 hyperintense right renal cyst noted. Visualized visceral structures otherwise unremarkable. Disc levels: L1-2: Negative interspace. Mild to moderate bilateral facet hypertrophy. No  significant canal or foraminal stenosis. L2-3: Progressive 4 mm retrolisthesis. Intervertebral disc space narrowing with diffuse disc bulge and disc desiccation. Reactive endplate changes with marginal endplate osteophytic spurring. Disc bulging asymmetric to the right. Moderate facet and ligament flavum hypertrophy. Resultant severe canal with moderate right worse than left L2 foraminal stenosis. L3-4: Prior posterior and interbody fusion. No significant residual spinal  stenosis. Foramina are patent. L4-5: Prior fusion. No residual or recurrent canal or foraminal stenosis. L5-S1: Prior fusion with posterior decompression. No residual canal or foraminal stenosis. IMPRESSION: MR THORACIC SPINE IMPRESSION 1. No acute abnormality within the thoracic spine. No evidence for cord compression or severe spinal stenosis. 2. Multilevel degenerative disc bulging with facet hypertrophy with resultant mild to moderate bilateral foraminal narrowing at T5-6, T10-11, and T11-12 as above. 3. 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 MR LUMBAR SPINE IMPRESSION 1. No acute abnormality within the lumbar spine. No evidence for cord compression. 2. Prior fusion at L3-4 through L5-S1 without residual or recurrent stenosis. 3. 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 Electronically Signed   By: Jeannine Boga M.D.   On: 02/08/2019 19:10   US Renal  Result Date: 02/09/2019 CLINICAL DATA:  Acute renal failure EXAM: RENAL / URINARY TRACT ULTRASOUND COMPLETE COMPARISON:  None. FINDINGS: Right Kidney: Renal measurements: 9.6 x 4.5 x 5 cm = volume: 113 mL . Echogenicity within normal limits. No mass or hydronephrosis visualized. Left Kidney: Renal measurements: 9.9 x 4.2 x 4.5 cm = volume: 98 mL. Echogenicity within normal limits. No mass or hydronephrosis visualized. Bladder: Incompletely distended and not visualized. IMPRESSION: 1. No  hydronephrosis or other reversible finding. 2. Symmetric renal size. Electronically Signed   By: Monte Fantasia M.D.   On: 02/09/2019 13:24   US Carotid Bilateral  Result Date: 02/13/2019 CLINICAL DATA:  Recent stroke. EXAM: BILATERAL CAROTID DUPLEX ULTRASOUND TECHNIQUE: Pearline Cables scale imaging, color Doppler and duplex ultrasound were performed of bilateral carotid and vertebral arteries in the neck. COMPARISON:  01/23/2018 FINDINGS: Criteria: Quantification of carotid stenosis is based on velocity parameters that correlate the residual internal carotid diameter with NASCET-based stenosis levels, using the diameter of the distal internal carotid lumen as the denominator for stenosis measurement. The following velocity measurements were obtained: RIGHT ICA: 77/13 cm/sec CCA: 454/09 cm/sec SYSTOLIC ICA/CCA RATIO:  0.7 ECA: 64 cm/sec LEFT ICA: 107/19 cm/sec CCA: 811/91 cm/sec SYSTOLIC ICA/CCA RATIO:  0.8 ECA: 87 cm/sec RIGHT CAROTID ARTERY: Intimal thickening in the right common carotid artery. Heterogeneous plaque at the right carotid bulb. Plaque at the origin of the external carotid artery. External carotid artery is patent with normal waveform. Heterogeneous plaque in the proximal internal carotid artery. Normal waveforms and velocities in the internal carotid artery. RIGHT VERTEBRAL ARTERY: Antegrade flow and normal waveform in the right vertebral artery. LEFT CAROTID ARTERY: Intimal thickening in the left common carotid artery. External carotid artery is patent with normal waveform. Normal waveforms and velocities in the internal carotid artery. LEFT VERTEBRAL ARTERY: Antegrade flow and normal waveform in the left vertebral artery. 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 Estimated degree of stenosis in the internal carotid arteries is less than 50% bilaterally. Patent vertebral arteries with antegrade flow. Electronically Signed   By: Markus Daft M.D.    On: 02/13/2019 13:14   US Venous Img Lower Right (dvt Study)  Result Date: 02/08/2019 CLINICAL DATA:  Right lower extremity pain and edema. Evaluate for DVT. EXAM: RIGHT LOWER EXTREMITY VENOUS DOPPLER ULTRASOUND TECHNIQUE: Gray-scale sonography with graded compression, as well as color Doppler and duplex ultrasound were performed to evaluate the lower extremity deep venous systems from the level of the common femoral vein and including the common femoral, femoral, profunda femoral, popliteal and calf veins including the posterior tibial, peroneal and gastrocnemius veins when visible.  The superficial great saphenous vein was also interrogated. Spectral Doppler was utilized to evaluate flow at rest and with distal augmentation maneuvers in the common femoral, femoral and popliteal veins. COMPARISON:  None. FINDINGS: Contralateral Common Femoral Vein: Respiratory phasicity is normal and symmetric with the symptomatic side. No evidence of thrombus. Normal compressibility. Common Femoral Vein: No evidence of thrombus. Normal compressibility, respiratory phasicity and response to augmentation. Saphenofemoral Junction: No evidence of thrombus. Normal compressibility and flow on color Doppler imaging. Profunda Femoral Vein: No evidence of thrombus. Normal compressibility and flow on color Doppler imaging. Femoral Vein: No evidence of thrombus. Normal compressibility, respiratory phasicity and response to augmentation. Popliteal Vein: No evidence of thrombus. Normal compressibility, respiratory phasicity and response to augmentation. Calf Veins: No evidence of thrombus. Normal compressibility and flow on color Doppler imaging. Superficial Great Saphenous Vein: No evidence of thrombus. Normal compressibility. Venous Reflux:  None. Other Findings:  None. IMPRESSION: No evidence of DVT within the right lower extremity. Electronically Signed   By: Sandi Mariscal M.D.   On: 02/08/2019 15:54   Dg Fluoro Guided Needle Plc  Aspiration/injection Loc  Result Date: 02/12/2019 CLINICAL DATA:  Symptoms suggesting multiple sclerosis EXAM: DIAGNOSTIC LUMBAR PUNCTURE UNDER FLUOROSCOPIC GUIDANCE FLUOROSCOPY TIME:  Fluoroscopy Time:  0 minutes 50 seconds Radiation Exposure Index (if provided by the fluoroscopic device): 48.2 mGy Number of Acquired Spot Images: 1 PROCEDURE: Procedure, benefits, and risks were discussed with the patient, including alternatives. Patient's questions were answered. Written informed consent was obtained. Timeout protocol followed. Patient placed prone. L3-L4 disc space was localized under fluoroscopy. Skin prepped and draped in usual sterile fashion. Skin and soft tissues anesthetized with 3 mL of 1% lidocaine. A 22 gauge needle was advanced into the spine. Unable to access spinal canal at this level due to dense fibrosis/calcification. Patient placed RAO and attention turned to the LEFT oblique approach at L4-L5. Skin and soft tissues anesthetized with 2 mL of 1% lidocaine. A 22 gauge needle advanced into the spinal canal where clear colorless CSF was encountered. Opening pressure 25 cm H2O. 11 mL of CSF was obtained in 4 tubes for requested analysis. Procedure tolerated well by patient without immediate complication. IMPRESSION: Fluoroscopic guided lumbar puncture as above. Electronically Signed   By: Lavonia Dana M.D.   On: 02/12/2019 13:08        Discharge Exam: Vitals:   02/17/19 1030 02/17/19 1254  BP: 124/60 (!) 193/52  Pulse: (!) 54 66  Resp:  19  Temp:  98.2 F (36.8 C)  SpO2:  100%   Vitals:   02/17/19 0500 02/17/19 0853 02/17/19 1030 02/17/19 1254  BP: (!) 175/68 (!) 194/72 124/60 (!) 193/52  Pulse: (!) 59 65 (!) 54 66  Resp: 16   19  Temp: 98.5 F (36.9 C)   98.2 F (36.8 C)  TempSrc: Oral   Oral  SpO2: (!) 74%   100%  Weight:      Height:        General: Pt is alert, awake, not in acute distress Cardiovascular: RRR, S1/S2 +, no rubs, no gallops Respiratory: CTA  bilaterally, no wheezing, no rhonchi Abdominal: Soft, NT, ND, bowel sounds + Extremities: 1 + LE edema, no cyanosis   The results of significant diagnostics from this hospitalization (including imaging, microbiology, ancillary and laboratory) are listed below for reference.    Significant Diagnostic Studies: Mr Brain 29 Contrast  Result Date: 02/11/2019 CLINICAL DATA:  Headache, weakness and difficulty walking over the last 3 days.  EXAM: MRI HEAD WITHOUT CONTRAST TECHNIQUE: Multiplanar, multiecho pulse sequences of the brain and surrounding structures were obtained without intravenous contrast. COMPARISON:  01/22/2018 FINDINGS: Brain: The brainstem is normal. 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 Cerebral hemispheres show moderate changes of chronic small vessel disease affecting the thalami, basal ganglia and hemispheric deep white matter. There is a newly seen 8 mm focus of restricted diffusion in the right splenium of the corpus callosum. This could represent a small vessel infarction or a focus of demyelination. No large vessel territory infarction. No evidence of neoplastic mass lesion. After contrast administration, there is a 4 mm focus of enhancement in the right posterior parietal subcortical white matter. 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 No prior contrast enhanced exam. Vascular: Major vessels at the base of the brain show flow. Skull and upper cervical spine: Negative Sinuses/Orbits: Clear/normal Other: None IMPRESSION: Newly seen 8 mm focus of restricted diffusion in the right splenium of the corpus callosum. This could be a subacute infarction or a focus of demyelination. Elsewhere, there chronic small-vessel ischemic changes affecting the left cerebellum, both thalami and basal ganglia and the cerebral hemispheric white matter. It would seem that small-vessel disease is the most  likely diagnosis, particularly given the history of hypertension and hyperlipidemia. 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 Electronically Signed   By: Nelson Chimes M.D.   On: 02/11/2019 13:47   Mr Cervical Spine Wo Contrast  Result Date: 02/11/2019 CLINICAL DATA:  Episodic spasms of the right lower extremity. Severe weakness of the right lower extremity. Fecal incontinence. Anesthesia of the right foot and distal leg. Symptomatology consistent with cervical myelopathy. EXAM: MRI CERVICAL SPINE WITHOUT CONTRAST TECHNIQUE: Multiplanar, multisequence MR imaging of the cervical spine was performed. No intravenous contrast was administered. COMPARISON:  CT scan of the cervical spine dated 03/05/2018 and cervical MRI dated 01/23/2018 FINDINGS: Alignment: Physiologic. Vertebrae: No fracture, evidence of discitis, or bone lesion. Cord: Normal signal and morphology. Posterior Fossa, vertebral arteries, paraspinal tissues: Negative. Disc levels: C2-3: Negative. C3-4: Small central subligamentous disc protrusion slightly asymmetric to the right touching the ventral aspect of the spinal cord but without focal neural impingement. C4-5: Small broad-based soft disc protrusion without neural impingement. C5-6: Tiny broad-based disc bulge with no neural impingement. Uncinate spurs slightly narrow the right neural foramen, unchanged. C6-7: Small broad-based disc bulge without neural impingement. Uncinate spurs narrow the left neural foramen, unchanged. IMPRESSION: 1. No discrete cervical myelopathy. 2. Multilevel degenerative disc disease with small disc protrusions at C3-4 and C4-5 without focal neural impingement. Three slight right foraminal narrowing at C5-6 and slight left foraminal narrowing at C6-7 due to uncinate spurs, unchanged. Electronically Signed   By: Lorriane Shire M.D.   On: 02/11/2019 09:45   Mr Thoracic Spine  Wo Contrast  Result Date: 02/08/2019 CLINICAL DATA:  Initial evaluation for back pain, chronic right lower extremity pain. EXAM: MRI THORACIC AND LUMBAR SPINE WITHOUT CONTRAST TECHNIQUE: Multiplanar and multiecho pulse sequences of the thoracic and lumbar spine were obtained without intravenous contrast. COMPARISON:  Prior MRI from 12/29/2015. FINDINGS: MRI THORACIC SPINE FINDINGS Alignment: Trace dextroscoliosis. Alignment otherwise normal with preservation of the normal thoracic kyphosis. No listhesis or subluxation. Vertebrae: Vertebral body height maintained without evidence for acute or chronic fracture. Underlying bone marrow signal intensity within normal limits.  Few scattered benign hemangiomas noted within the T9 and T10 vertebral bodies. 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 No other discrete or worrisome osseous lesions. No other abnormal marrow edema. Cord: Signal intensity within the thoracic spinal cord is normal. Normal cord caliber morphology. Paraspinal and other soft tissues: Unremarkable. Disc levels: T1-2:  Unremarkable. T2-3: Left-sided facet hypertrophy.  No stenosis. T3-4: Small central disc protrusion indents the ventral thecal sac. Mild facet hypertrophy. No significant stenosis or cord deformity. T4-5:  Mild right greater than left facet hypertrophy.  No stenosis. T5-6: Minimal disc bulge. Mild right greater than left facet hypertrophy. No spinal stenosis. Mild right foraminal narrowing. T6-7: Diffuse disc bulge. Mild bilateral facet hypertrophy. Perineural cyst at the left neural foramen. No significant stenosis. T7-8: Diffuse disc bulge with superimposed small central disc protrusion indents the ventral thecal sac. Mild bilateral facet hypertrophy. Thecal sac remains patent. No significant cord deformity. Foramina remain patent. T8-9: Diffuse disc bulge with mild bilateral facet hypertrophy. No significant stenosis. T9-10: Mild diffuse disc bulge. Mild to moderate  facet hypertrophy. No significant stenosis. T10-11: Diffuse disc bulge. Moderate left greater than right facet hypertrophy. Thecal sac remains patent. Mild bilateral foraminal narrowing. T11-12: Negative interspace. Moderate to advanced left greater than right facet hypertrophy. Thecal sac remains patent. Moderate left with mild right foraminal narrowing. T12-L1: Negative interspace. Moderate facet hypertrophy. No significant stenosis. MRI LUMBAR SPINE FINDINGS Segmentation: Examination mildly limited as the patient was unable to tolerate the full length of the exam. STIR sequence was not performed. Standard.  Lowest well-formed disc labeled the L5-S1 level. Alignment: 4 mm retrolisthesis of L2 on L3, progressed from previous. Alignment otherwise normal with preservation of the normal lumbar lordosis. Vertebrae: Susceptibility artifact from prior fusion at L3 through S1. Vertebral body height maintained without evidence for acute or chronic fracture. Underlying bone marrow signal intensity within normal limits. 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 No other discrete or worrisome osseous lesions. Progressive reactive endplate changes noted at the inferior endplate of L2. Conus medullaris and cauda equina: Conus extends to the L1 level. Conus and cauda equina appear normal. Paraspinal and other soft tissues: Few scattered subcentimeter T2 hyperintense right renal cyst noted. Visualized visceral structures otherwise unremarkable. Disc levels: L1-2: Negative interspace. Mild to moderate bilateral facet hypertrophy. No significant canal or foraminal stenosis. L2-3: Progressive 4 mm retrolisthesis. Intervertebral disc space narrowing with diffuse disc bulge and disc desiccation. Reactive endplate changes with marginal endplate osteophytic spurring. Disc bulging asymmetric to the right. Moderate facet and ligament flavum hypertrophy. Resultant severe canal with moderate right worse than left L2 foraminal  stenosis. L3-4: Prior posterior and interbody fusion. No significant residual spinal stenosis. Foramina are patent. L4-5: Prior fusion. No residual or recurrent canal or foraminal stenosis. L5-S1: Prior fusion with posterior decompression. No residual canal or foraminal stenosis. IMPRESSION: MR THORACIC SPINE IMPRESSION 1. No acute abnormality within the thoracic spine. No evidence for cord compression or severe spinal stenosis. 2. Multilevel degenerative disc bulging with facet hypertrophy with resultant mild to moderate bilateral foraminal narrowing at T5-6, T10-11, and T11-12 as above. 3. 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 MR LUMBAR SPINE IMPRESSION 1. No acute abnormality within the lumbar spine. No evidence for cord compression. 2. Prior fusion at L3-4 through L5-S1 without residual or recurrent stenosis. 3. 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 Electronically Signed   By: Jeannine Boga M.D.   On: 02/08/2019  19:10   Mr Lumbar Spine Wo Contrast  Result Date: 02/08/2019 CLINICAL DATA:  Initial evaluation for back pain, chronic right lower extremity pain. EXAM: MRI THORACIC AND LUMBAR SPINE WITHOUT CONTRAST TECHNIQUE: Multiplanar and multiecho pulse sequences of the thoracic and lumbar spine were obtained without intravenous contrast. COMPARISON:  Prior MRI from 12/29/2015. FINDINGS: MRI THORACIC SPINE FINDINGS Alignment: Trace dextroscoliosis. Alignment otherwise normal with preservation of the normal thoracic kyphosis. No listhesis or subluxation. Vertebrae: Vertebral body height maintained without evidence for acute or chronic fracture. Underlying bone marrow signal intensity within normal limits. Few scattered benign hemangiomas noted within the T9 and T10 vertebral bodies. 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 No other discrete or worrisome osseous lesions. No other abnormal marrow  edema. Cord: Signal intensity within the thoracic spinal cord is normal. Normal cord caliber morphology. Paraspinal and other soft tissues: Unremarkable. Disc levels: T1-2:  Unremarkable. T2-3: Left-sided facet hypertrophy.  No stenosis. T3-4: Small central disc protrusion indents the ventral thecal sac. Mild facet hypertrophy. No significant stenosis or cord deformity. T4-5:  Mild right greater than left facet hypertrophy.  No stenosis. T5-6: Minimal disc bulge. Mild right greater than left facet hypertrophy. No spinal stenosis. Mild right foraminal narrowing. T6-7: Diffuse disc bulge. Mild bilateral facet hypertrophy. Perineural cyst at the left neural foramen. No significant stenosis. T7-8: Diffuse disc bulge with superimposed small central disc protrusion indents the ventral thecal sac. Mild bilateral facet hypertrophy. Thecal sac remains patent. No significant cord deformity. Foramina remain patent. T8-9: Diffuse disc bulge with mild bilateral facet hypertrophy. No significant stenosis. T9-10: Mild diffuse disc bulge. Mild to moderate facet hypertrophy. No significant stenosis. T10-11: Diffuse disc bulge. Moderate left greater than right facet hypertrophy. Thecal sac remains patent. Mild bilateral foraminal narrowing. T11-12: Negative interspace. Moderate to advanced left greater than right facet hypertrophy. Thecal sac remains patent. Moderate left with mild right foraminal narrowing. T12-L1: Negative interspace. Moderate facet hypertrophy. No significant stenosis. MRI LUMBAR SPINE FINDINGS Segmentation: Examination mildly limited as the patient was unable to tolerate the full length of the exam. STIR sequence was not performed. Standard.  Lowest well-formed disc labeled the L5-S1 level. Alignment: 4 mm retrolisthesis of L2 on L3, progressed from previous. Alignment otherwise normal with preservation of the normal lumbar lordosis. Vertebrae: Susceptibility artifact from prior fusion at L3 through S1. Vertebral  body height maintained without evidence for acute or chronic fracture. Underlying bone marrow signal intensity within normal limits. 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 No other discrete or worrisome osseous lesions. Progressive reactive endplate changes noted at the inferior endplate of L2. Conus medullaris and cauda equina: Conus extends to the L1 level. Conus and cauda equina appear normal. Paraspinal and other soft tissues: Few scattered subcentimeter T2 hyperintense right renal cyst noted. Visualized visceral structures otherwise unremarkable. Disc levels: L1-2: Negative interspace. Mild to moderate bilateral facet hypertrophy. No significant canal or foraminal stenosis. L2-3: Progressive 4 mm retrolisthesis. Intervertebral disc space narrowing with diffuse disc bulge and disc desiccation. Reactive endplate changes with marginal endplate osteophytic spurring. Disc bulging asymmetric to the right. Moderate facet and ligament flavum hypertrophy. Resultant severe canal with moderate right worse than left L2 foraminal stenosis. L3-4: Prior posterior and interbody fusion. No significant residual spinal stenosis. Foramina are patent. L4-5: Prior fusion. No residual or recurrent canal or foraminal stenosis. L5-S1: Prior fusion with posterior decompression. No residual canal or foraminal stenosis. IMPRESSION: MR THORACIC SPINE IMPRESSION 1. No acute abnormality within the thoracic spine. No  evidence for cord compression or severe spinal stenosis. 2. Multilevel degenerative disc bulging with facet hypertrophy with resultant mild to moderate bilateral foraminal narrowing at T5-6, T10-11, and T11-12 as above. 3. 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 MR LUMBAR SPINE IMPRESSION 1. No acute abnormality within the lumbar spine. No evidence for cord compression. 2. Prior fusion at L3-4 through L5-S1 without residual or recurrent stenosis. 3. Progressive adjacent segment disease at L2-3 with  resultant severe canal with moderate bilateral L2 foraminal stenosis. Electronically Signed   By: Jeannine Boga M.D.   On: 02/08/2019 19:10   US Renal  Result Date: 02/09/2019 CLINICAL DATA:  Acute renal failure EXAM: RENAL / URINARY TRACT ULTRASOUND COMPLETE COMPARISON:  None. FINDINGS: Right Kidney: Renal measurements: 9.6 x 4.5 x 5 cm = volume: 113 mL . Echogenicity within normal limits. No mass or hydronephrosis visualized. Left Kidney: Renal measurements: 9.9 x 4.2 x 4.5 cm = volume: 98 mL. Echogenicity within normal limits. No mass or hydronephrosis visualized. Bladder: Incompletely distended and not visualized. IMPRESSION: 1. No hydronephrosis or other reversible finding. 2. Symmetric renal size. Electronically Signed   By: Monte Fantasia M.D.   On: 02/09/2019 13:24   US Carotid Bilateral  Result Date: 02/13/2019 CLINICAL DATA:  Recent stroke. EXAM: BILATERAL CAROTID DUPLEX ULTRASOUND TECHNIQUE: Pearline Cables scale imaging, color Doppler and duplex ultrasound were performed of bilateral carotid and vertebral arteries in the neck. COMPARISON:  01/23/2018 FINDINGS: Criteria: Quantification of carotid stenosis is based on velocity parameters that correlate the residual internal carotid diameter with NASCET-based stenosis levels, using the diameter of the distal internal carotid lumen as the denominator for stenosis measurement. The following velocity measurements were obtained: RIGHT ICA: 77/13 cm/sec CCA: 300/92 cm/sec SYSTOLIC ICA/CCA RATIO:  0.7 ECA: 64 cm/sec LEFT ICA: 107/19 cm/sec CCA: 330/07 cm/sec SYSTOLIC ICA/CCA RATIO:  0.8 ECA: 87 cm/sec RIGHT CAROTID ARTERY: Intimal thickening in the right common carotid artery. Heterogeneous plaque at the right carotid bulb. Plaque at the origin of the external carotid artery. External carotid artery is patent with normal waveform. Heterogeneous plaque in the proximal internal carotid artery. Normal waveforms and velocities in the internal carotid artery.  RIGHT VERTEBRAL ARTERY: Antegrade flow and normal waveform in the right vertebral artery. LEFT CAROTID ARTERY: Intimal thickening in the left common carotid artery. External carotid artery is patent with normal waveform. Normal waveforms and velocities in the internal carotid artery. LEFT VERTEBRAL ARTERY: Antegrade flow and normal waveform in the left vertebral artery. 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 Estimated degree of stenosis in the internal carotid arteries is less than 50% bilaterally. Patent vertebral arteries with antegrade flow. Electronically Signed   By: Markus Daft M.D.   On: 02/13/2019 13:14   US Venous Img Lower Right (dvt Study)  Result Date: 02/08/2019 CLINICAL DATA:  Right lower extremity pain and edema. Evaluate for DVT. EXAM: RIGHT LOWER EXTREMITY VENOUS DOPPLER ULTRASOUND TECHNIQUE: Gray-scale sonography with graded compression, as well as color Doppler and duplex ultrasound were performed to evaluate the lower extremity deep venous systems from the level of the common femoral vein and including the common femoral, femoral, profunda femoral, popliteal and calf veins including the posterior tibial, peroneal and gastrocnemius veins when visible. The superficial great saphenous vein was also interrogated. Spectral Doppler was utilized to evaluate flow at rest and with distal augmentation maneuvers in the common femoral, femoral and popliteal veins. COMPARISON:  None. FINDINGS: Contralateral Common Femoral Vein: Respiratory phasicity is  normal and symmetric with the symptomatic side. No evidence of thrombus. Normal compressibility. Common Femoral Vein: No evidence of thrombus. Normal compressibility, respiratory phasicity and response to augmentation. Saphenofemoral Junction: No evidence of thrombus. Normal compressibility and flow on color Doppler imaging. Profunda Femoral Vein: No evidence of thrombus. Normal compressibility and  flow on color Doppler imaging. Femoral Vein: No evidence of thrombus. Normal compressibility, respiratory phasicity and response to augmentation. Popliteal Vein: No evidence of thrombus. Normal compressibility, respiratory phasicity and response to augmentation. Calf Veins: No evidence of thrombus. Normal compressibility and flow on color Doppler imaging. Superficial Great Saphenous Vein: No evidence of thrombus. Normal compressibility. Venous Reflux:  None. Other Findings:  None. IMPRESSION: No evidence of DVT within the right lower extremity. Electronically Signed   By: Sandi Mariscal M.D.   On: 02/08/2019 15:54   Dg Fluoro Guided Needle Plc Aspiration/injection Loc  Result Date: 02/12/2019 CLINICAL DATA:  Symptoms suggesting multiple sclerosis EXAM: DIAGNOSTIC LUMBAR PUNCTURE UNDER FLUOROSCOPIC GUIDANCE FLUOROSCOPY TIME:  Fluoroscopy Time:  0 minutes 50 seconds Radiation Exposure Index (if provided by the fluoroscopic device): 48.2 mGy Number of Acquired Spot Images: 1 PROCEDURE: Procedure, benefits, and risks were discussed with the patient, including alternatives. Patient's questions were answered. Written informed consent was obtained. Timeout protocol followed. Patient placed prone. L3-L4 disc space was localized under fluoroscopy. Skin prepped and draped in usual sterile fashion. Skin and soft tissues anesthetized with 3 mL of 1% lidocaine. A 22 gauge needle was advanced into the spine. Unable to access spinal canal at this level due to dense fibrosis/calcification. Patient placed RAO and attention turned to the LEFT oblique approach at L4-L5. Skin and soft tissues anesthetized with 2 mL of 1% lidocaine. A 22 gauge needle advanced into the spinal canal where clear colorless CSF was encountered. Opening pressure 25 cm H2O. 11 mL of CSF was obtained in 4 tubes for requested analysis. Procedure tolerated well by patient without immediate complication. IMPRESSION: Fluoroscopic guided lumbar puncture as above.  Electronically Signed   By: Lavonia Dana M.D.   On: 02/12/2019 13:08     Microbiology: Recent Results (from the past 240 hour(s))  SARS Coronavirus 2 (CEPHEID - Performed in [place] hospital lab), Hosp Order     Status: None   Collection Time: 02/08/19  8:35 PM   Specimen: Nasopharyngeal Swab  Result Value Ref Range Status   SARS Coronavirus 2 NEGATIVE NEGATIVE Final    Comment: (NOTE) If result is NEGATIVE SARS-CoV-2 target nucleic acids are NOT DETECTED. The SARS-CoV-2 RNA is generally detectable in upper and lower  respiratory specimens during the acute phase of infection. The lowest  concentration of SARS-CoV-2 viral copies this assay can detect is 250  copies / mL. A negative result does not preclude SARS-CoV-2 infection  and should not be used as the sole basis for treatment or other  patient management decisions.  A negative result may occur with  improper specimen collection / handling, submission of specimen other  than nasopharyngeal swab, presence of viral mutation(s) within the  areas targeted by this assay, and inadequate number of viral copies  (<250 copies / mL). A negative result must be combined with clinical  observations, patient history, and epidemiological information. If result is POSITIVE SARS-CoV-2 target nucleic acids are DETECTED. The SARS-CoV-2 RNA is generally detectable in upper and lower  respiratory specimens dur ing the acute phase of infection.  Positive  results are indicative of active infection with SARS-CoV-2.  Clinical  correlation with patient  history and other diagnostic information is  necessary to determine patient infection status.  Positive results do  not rule out bacterial infection or co-infection with other viruses. If result is PRESUMPTIVE POSTIVE SARS-CoV-2 nucleic acids MAY BE PRESENT.   A presumptive positive result was obtained on the submitted specimen  and confirmed on repeat testing.  While 2019 novel coronavirus   (SARS-CoV-2) nucleic acids may be present in the submitted sample  additional confirmatory testing may be necessary for epidemiological  and / or clinical management purposes  to differentiate between  SARS-CoV-2 and other Sarbecovirus currently known to infect humans.  If clinically indicated additional testing with an alternate test  methodology [phone]) is advised. The SARS-CoV-2 RNA is generally  detectable in upper and lower respiratory sp ecimens during the acute  phase of infection. The expected result is Negative. Fact Sheet for Patients:  StrictlyIdeas.no Fact Sheet for Healthcare Providers: BankingDealers.co.za This test is not yet approved or cleared by the [place] FDA and has been authorized for detection and/or diagnosis of SARS-CoV-2 by FDA under an Emergency Use Authorization (EUA).  This EUA will remain in effect (meaning this test can be used) for the duration of the COVID-19 declaration under Section 564(b)(1) of the Act, 21 U.S.C. section 360bbb-3(b)(1), unless the authorization is terminated or revoked sooner. Performed at [facility], [address], [place], [place] [address]   Novel Coronavirus,NAA,(SEND-OUT TO REF LAB - Jam[facility] Dolce 24-48 hrs); Hosp Order     Status: None   Collection Time: 02/13/19  6:23 PM   Specimen: Nasopharyngeal Swab; Respiratory  Result Value Ref Range Status   SARS-CoV-2, NAA NOT DETECTED NOT DETECTED Final    Comment: (NOTE) This test was developed and its performance characteristics determined by [employer]. This test has not been FDA cleared or approved. This test has been authorized by FDA under an Emergency Use Authorization (EUA). This test is only authorized for the duration of time the declaration that circumstances exist justifying the authorization of the emergency use of in vitro diagnostic tests for detection of SARS-CoV-2 virus and/or diagnosis of COVID-19 infection  under section 564(b)(1) of the Act, 21 U.S.C. 481EHU-3(J)(4), unless the authorization is terminated or revoked sooner. When diagnostic testing is negative, the possibility of a false negative result should be considered in the context of a patient's recent exposures and the presence of clinical signs and symptoms consistent with COVID-19. An individual without symptoms of COVID-19 and who is not shedding SARS-CoV-2 virus would expect to have a negative (not detected) result in this assay. Performed  At: [facility] [place], [name] [id number] Rush Farmer MD YI:[id number]    [place]  Final    Comment: Performed at [facility], [address] [place], [place] [address]     Labs: Basic Metabolic Panel: Recent Labs  Lab 02/11/19 0529 02/12/19 0508 02/13/19 0520 02/14/19 0517 02/15/19 0516  NA 139 137 136 139 141  K 4.7 4.6 4.6 4.2 5.2*  CL 108 107 105 105 105  CO2 20* '23 23 25 27  '$ GLUCOSE 130* 141* 137* 102* 89  BUN 31* 31* 37* 44* 50*  CREATININE 1.65* 1.56* 1.54* 1.91* 2.06*  CALCIUM 9.0 8.8* 8.4* 8.3* 8.2*   Liver Function Tests: No results for input(s): AST, ALT, ALKPHOS, BILITOT, PROT, ALBUMIN in the last 168 hours. No results for input(s): LIPASE, AMYLASE in the last 168 hours. No results for input(s): AMMONIA in the last 168 hours. CBC: Recent Labs  Lab 02/11/19 0529 02/12/19 0508 02/13/19 0520 02/14/19 0517  WBC 5.9 13.9* 14.1* 14.3*  HGB 10.4* 9.6* 9.0* 9.7*  HCT 34.0* 30.6* 28.5* 31.9*  MCV 82.9 81.2 81.0 84.2  PLT 241 239 235 258   Cardiac Enzymes: No results for input(s): CKTOTAL, CKMB, CKMBINDEX, TROPONINI in the last 168 hours. BNP: Invalid input(s): POCBNP CBG: No results for input(s): GLUCAP in the last 168 hours.  Time coordinating discharge:  36 minutes  Signed:  Orson Eva, DO [facility] Pager: [phone] 02/17/2019, 2:35 PM

## 2019-02-17 NOTE — Progress Notes (Signed)
IV removed, WNL. D/C instructions given to pt, verbalized understanding. Report called and given to nursing staff at [facility]. EMS called for transport, should arrive anytime. Pt wishing for spouse to transport her but RN educated her on the importance of safety by going via EMS, keeping a low risk of exposure to COVID, and due to her hx of cocaine abuse it's not the safest option. Will continue to monitor.
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
## 2019-02-24 ENCOUNTER — Ambulatory Visit
Admission: RE | Admit: 2019-02-24 | Discharge: 2019-02-24 | Disposition: A | Discharge status: Home / Self Care | Payer: Medicare Other | Source: Ambulatory Visit | Attending: Physician Assistant | Admitting: Physician Assistant

## 2019-02-24 DIAGNOSIS — Z9889 Other specified postprocedural states: Secondary | ICD-10-CM

## 2019-02-25 LAB — MISCELLANEOUS TEST: Miscellaneous Test: 17728

## 2019-02-26 ENCOUNTER — Other Ambulatory Visit: Payer: Self-pay | Admitting: Physician Assistant

## 2019-02-26 DIAGNOSIS — G8929 Other chronic pain: Secondary | ICD-10-CM

## 2019-03-24 ENCOUNTER — Other Ambulatory Visit: Payer: Self-pay

## 2019-03-24 ENCOUNTER — Ambulatory Visit
Admission: RE | Admit: 2019-03-24 | Discharge: 2019-03-24 | Disposition: A | Discharge status: Home / Self Care | Payer: Medicare Other | Source: Ambulatory Visit | Attending: Physician Assistant | Admitting: Physician Assistant

## 2019-03-24 DIAGNOSIS — Z9889 Other specified postprocedural states: Secondary | ICD-10-CM

## 2019-03-24 DIAGNOSIS — G8929 Other chronic pain: Secondary | ICD-10-CM

## 2019-03-24 DIAGNOSIS — M25512 Pain in left shoulder: Secondary | ICD-10-CM

## 2019-03-24 MED ORDER — IOPAMIDOL (ISOVUE-M 200) INJECTION 41%
15.0000 mL | Freq: Once | INTRAMUSCULAR | Status: AC
  Administered 2019-03-24: 15 mL via INTRA_ARTICULAR

## 2019-03-25 ENCOUNTER — Ambulatory Visit: Payer: Medicare Other | Admitting: Gastroenterology

## 2019-03-26 ENCOUNTER — Encounter: Payer: Self-pay | Admitting: Orthopaedic Surgery

## 2019-03-26 NOTE — Progress Notes (Signed)
F/u to discuss

## 2019-03-29 NOTE — Telephone Encounter (Signed)
F/u to discuss

## 2019-03-29 NOTE — Telephone Encounter (Signed)
I just spoke with her.  This patient has re-tear of rotator cuff s/p repair.  She's got atrophy and I don't feel that it's repairable again.  I think she may need reverse.  She's scheduled to see me tomorrow but can we just have her see Marlou Sa directly please?  Can someone also cancel her appt with me tomorrow as well.  Thanks.

## 2019-03-30 ENCOUNTER — Ambulatory Visit: Payer: Medicare Other | Admitting: Orthopaedic Surgery

## 2019-03-31 ENCOUNTER — Encounter: Payer: Self-pay | Admitting: Orthopedic Surgery

## 2019-03-31 ENCOUNTER — Ambulatory Visit: Payer: Medicare Other | Admitting: Orthopedic Surgery
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
## 2019-04-16 ENCOUNTER — Other Ambulatory Visit: Payer: Self-pay

## 2019-04-16 ENCOUNTER — Encounter: Payer: Self-pay | Admitting: Gastroenterology

## 2019-04-16 ENCOUNTER — Ambulatory Visit (INDEPENDENT_AMBULATORY_CARE_PROVIDER_SITE_OTHER): Payer: Medicare Other | Admitting: Gastroenterology

## 2019-04-16 VITALS — BP 132/69 | HR 76 | Temp 97.1°F | Ht 67.0 in | Wt 254.4 lb

## 2019-04-16 DIAGNOSIS — D649 Anemia, unspecified: Secondary | ICD-10-CM
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
## 2019-04-16 NOTE — Patient Instructions (Signed)
Please have blood work completed when are you able.   We are arranging a colonoscopy and possible upper endoscopy in the near future.  We will refer you back to Neurology and Nephrology.  Further recommendations to follow!  It was a pleasure to see you today. I want to create trusting relationships with patients to provide genuine, compassionate, and quality care. I value your feedback. If you receive a survey regarding your visit,  I greatly appreciate you taking time to fill this out.   Annitta Needs, PhD, ANP-BC [facility]

## 2019-04-19 ENCOUNTER — Ambulatory Visit (HOSPITAL_COMMUNITY): Payer: Medicare Other | Admitting: Physical Therapy

## 2019-04-19 ENCOUNTER — Telehealth (HOSPITAL_COMMUNITY): Payer: Self-pay | Admitting: Physical Therapy

## 2019-04-19 ENCOUNTER — Encounter (HOSPITAL_COMMUNITY): Payer: Self-pay

## 2019-04-19 ENCOUNTER — Telehealth (HOSPITAL_COMMUNITY): Payer: Self-pay

## 2019-04-19 ENCOUNTER — Ambulatory Visit (HOSPITAL_COMMUNITY): Payer: Medicare Other

## 2019-04-19 NOTE — Telephone Encounter (Signed)
called to cancel her appts due to she is having some furniture delivered to day to the home and someone has to be home to sign for it

## 2019-04-21 ENCOUNTER — Telehealth (HOSPITAL_COMMUNITY): Payer: Self-pay | Admitting: Physical Therapy

## 2019-04-21 ENCOUNTER — Telehealth (HOSPITAL_COMMUNITY): Payer: Self-pay

## 2019-04-21 ENCOUNTER — Ambulatory Visit (HOSPITAL_COMMUNITY): Payer: Medicare Other | Admitting: Physical Therapy

## 2019-04-21 ENCOUNTER — Ambulatory Visit (HOSPITAL_COMMUNITY): Payer: Medicare Other

## 2019-04-21 NOTE — Telephone Encounter (Signed)
pt called to say she is going to the hospital big toe is swollen. will call us back to reschedule.

## 2019-04-22 ENCOUNTER — Other Ambulatory Visit: Payer: Self-pay | Admitting: *Deleted

## 2019-04-22 ENCOUNTER — Telehealth: Payer: Self-pay | Admitting: Gastroenterology

## 2019-04-22 ENCOUNTER — Encounter: Payer: Self-pay | Admitting: *Deleted

## 2019-04-22 ENCOUNTER — Telehealth: Payer: Self-pay | Admitting: *Deleted

## 2019-04-22 DIAGNOSIS — D649 Anemia, unspecified: Secondary | ICD-10-CM

## 2019-04-22 MED ORDER — NA SULFATE-K SULFATE-MG SULF 17.5-3.13-1.6 GM/177ML PO SOLN: 1.0000 | Freq: Once | ORAL | 0 refills | Status: AC

## 2019-04-22 NOTE — Telephone Encounter (Signed)
Patient needs drug screen prior to procedures.

## 2019-04-22 NOTE — Telephone Encounter (Signed)
Noted. Called endo and LMOVM to make a note and UDS order placed

## 2019-04-22 NOTE — Assessment & Plan Note (Addendum)
63 year old female with history of anemia multifactorial in setting of CKD and evidence for IDA with ferritin 35 in July 2020. Presenting for updated colonoscopy, with last around 20 years ago in [place]. Possibly history of polyps per patient. In addition to colonoscopy, will possible need EGD due to evidence for IDA. As of note, she reports only one time use of cocaine this summer. Needs drug screen prior to procedure.   Proceed with colonoscopy with Dr. Oneida Alar in the near future. The risks, benefits, and alternatives have been discussed in detail with the patient. They state understanding and desire to proceed.  Propofol due to polypharmacy Refer back to Neurology and Nephrology Update iron studies

## 2019-04-22 NOTE — Telephone Encounter (Signed)
Called patient. She is scheduled for procedure 12/1 at 12:15pm. Patient aware will mail prep with pre-op/covid-19 appt. Confirmed address is [place] [facility] sent into pharmacy. Orders entered.

## 2019-04-22 NOTE — Addendum Note (Signed)
Addended by: Inge Rise on: 04/22/2019 04:49 PM   Modules accepted: Orders

## 2019-04-23 ENCOUNTER — Other Ambulatory Visit: Payer: Self-pay | Admitting: *Deleted

## 2019-04-23 DIAGNOSIS — N189 Chronic kidney disease, unspecified: Secondary | ICD-10-CM

## 2019-04-23 DIAGNOSIS — Z7689 Persons encountering health services in other specified circumstances: Secondary | ICD-10-CM

## 2019-04-25 NOTE — Progress Notes (Signed)
CC'ED TO PCP 

## 2019-05-04 ENCOUNTER — Ambulatory Visit (HOSPITAL_COMMUNITY): Payer: Medicare Other | Attending: Family Medicine | Admitting: Physical Therapy

## 2019-05-04 ENCOUNTER — Encounter (HOSPITAL_COMMUNITY): Payer: Self-pay | Admitting: Physical Therapy

## 2019-05-04 ENCOUNTER — Other Ambulatory Visit: Payer: Self-pay

## 2019-05-04 DIAGNOSIS — M6281 Muscle weakness (generalized): Secondary | ICD-10-CM | POA: Diagnosis present

## 2019-05-04 DIAGNOSIS — R2681 Unsteadiness on feet: Secondary | ICD-10-CM | POA: Diagnosis present

## 2019-05-04 DIAGNOSIS — R262 Difficulty in walking, not elsewhere classified: Secondary | ICD-10-CM | POA: Diagnosis not present
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
## 2019-05-05 ENCOUNTER — Telehealth: Payer: Self-pay

## 2019-05-05 NOTE — Telephone Encounter (Signed)
Tried to call Dr. Befekadu's office to f/u on referral, LMOVM. 

## 2019-05-07 ENCOUNTER — Encounter (HOSPITAL_COMMUNITY): Payer: Self-pay | Admitting: Physical Therapy

## 2019-05-07 ENCOUNTER — Ambulatory Visit (HOSPITAL_COMMUNITY): Payer: Medicare Other | Attending: Family Medicine | Admitting: Physical Therapy

## 2019-05-07 ENCOUNTER — Other Ambulatory Visit: Payer: Self-pay

## 2019-05-07 DIAGNOSIS — M545 Low back pain: Secondary | ICD-10-CM | POA: Diagnosis present

## 2019-05-07 DIAGNOSIS — R262 Difficulty in walking, not elsewhere classified: Secondary | ICD-10-CM | POA: Diagnosis not present

## 2019-05-07 DIAGNOSIS — G8929 Other chronic pain: Secondary | ICD-10-CM | POA: Insufficient documentation

## 2019-05-07 DIAGNOSIS — M6281 Muscle weakness (generalized): Secondary | ICD-10-CM | POA: Diagnosis present

## 2019-05-07 DIAGNOSIS — R2681 Unsteadiness on feet: Secondary | ICD-10-CM | POA: Insufficient documentation
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
## 2019-05-08 LAB — IRON,TIBC AND FERRITIN PANEL
%SAT: 7 % (calc) — ABNORMAL LOW (ref 16–45)
Ferritin: 54 ng/mL (ref 16–288)
Iron: 30 ug/dL — ABNORMAL LOW (ref 45–160)
TIBC: 457 mcg/dL (calc) — ABNORMAL HIGH (ref 250–450)

## 2019-05-10 ENCOUNTER — Telehealth: Payer: Self-pay

## 2019-05-10 NOTE — Progress Notes (Signed)
Ferritin low normal at 54 but improved from 3 months ago. Iron low at 30. Anemia multifactorial. Continue with plans for colonoscopy +/- EGD.

## 2019-05-10 NOTE — Telephone Encounter (Signed)
Pt returned call and is aware of her lab results and will continue with procedure as planned.

## 2019-05-11 ENCOUNTER — Ambulatory Visit (HOSPITAL_COMMUNITY): Payer: Medicare Other

## 2019-05-11 ENCOUNTER — Telehealth (HOSPITAL_COMMUNITY): Payer: Self-pay

## 2019-05-11 NOTE — Telephone Encounter (Signed)
Pt called to tell Myriam Jacobson she was sorry she missed her apptment today. She wasn't feeling well last night and took Meds and she overslept. She confirmed she will be here Friday for her next apptment. NF 05/11/2019

## 2019-05-11 NOTE — Telephone Encounter (Signed)
No show #1, called and left message concerning missed apt.  Included next apt date and time with contact number to call back.  Included no show policy in message as well.  [address] [place]; [place] [phone]

## 2019-05-13 ENCOUNTER — Telehealth: Payer: Self-pay | Admitting: *Deleted

## 2019-05-13 NOTE — Telephone Encounter (Signed)
Received patient instructions back in the mail. Note stated not deliverable.   LMOVM for pt

## 2019-05-13 NOTE — Telephone Encounter (Signed)
Received called from patient. She confirmed I mailed to correct address. She stated she was having issues at the post office but should be fixed now. She asked that I re mail the instructions to her. I have done so

## 2019-05-14 ENCOUNTER — Other Ambulatory Visit: Payer: Self-pay

## 2019-05-14 ENCOUNTER — Ambulatory Visit (HOSPITAL_COMMUNITY): Payer: Medicare Other | Admitting: Physical Therapy

## 2019-05-14 DIAGNOSIS — R262 Difficulty in walking, not elsewhere classified: Secondary | ICD-10-CM | POA: Diagnosis not present

## 2019-05-14 DIAGNOSIS — R2681 Unsteadiness on feet: Secondary | ICD-10-CM

## 2019-05-14 DIAGNOSIS — M6281 Muscle weakness (generalized): Secondary | ICD-10-CM
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
## 2019-05-18 ENCOUNTER — Ambulatory Visit (HOSPITAL_COMMUNITY): Payer: Medicare Other | Admitting: Physical Therapy

## 2019-05-18 ENCOUNTER — Other Ambulatory Visit: Payer: Self-pay

## 2019-05-20 ENCOUNTER — Other Ambulatory Visit: Payer: Self-pay

## 2019-05-20 ENCOUNTER — Ambulatory Visit (HOSPITAL_COMMUNITY): Payer: Medicare Other | Admitting: Physical Therapy

## 2019-05-20 ENCOUNTER — Encounter (HOSPITAL_COMMUNITY): Payer: Self-pay | Admitting: Physical Therapy

## 2019-05-20 DIAGNOSIS — R2681 Unsteadiness on feet: Secondary | ICD-10-CM

## 2019-05-20 DIAGNOSIS — G8929 Other chronic pain: Secondary | ICD-10-CM

## 2019-05-20 DIAGNOSIS — M545 Low back pain: Secondary | ICD-10-CM

## 2019-05-20 DIAGNOSIS — R262 Difficulty in walking, not elsewhere classified: Secondary | ICD-10-CM | POA: Diagnosis not present

## 2019-05-20 DIAGNOSIS — M6281 Muscle weakness (generalized): Secondary | ICD-10-CM
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
## 2019-05-20 NOTE — Patient Instructions (Addendum)
Bridging    Slowly raise buttocks from floor, keeping stomach tight. Repeat ___5_ times per set. Do ___1_ sets per session. Do __2__ sessions per day.  http://orth.exer.us/1096   Copyright  VHI. All rights reserved.  Hip Abduction / Adduction: with Knee Flexion (Supine)    With right knee bent, gently lower knee to side and return.  Repeat to left  Repeat __5__ times per set. Do _1___ sets per session. Do __2__ sessions per day.  http://orth.exer.us/682   Copyright  VHI. All rights reserved.

## 2019-05-21 ENCOUNTER — Telehealth: Payer: Self-pay | Admitting: Gastroenterology

## 2019-05-21 NOTE — Telephone Encounter (Signed)
Noted. FYI to AB 

## 2019-05-21 NOTE — Telephone Encounter (Signed)
Dr Freddie Apley office called to say that they are working on getting a referral from the patient's PCP (Dr Cindie Laroche) due to insurance.

## 2019-05-25 ENCOUNTER — Encounter (HOSPITAL_COMMUNITY): Payer: Self-pay | Admitting: Physical Therapy

## 2019-05-25 ENCOUNTER — Other Ambulatory Visit: Payer: Self-pay

## 2019-05-25 ENCOUNTER — Ambulatory Visit (HOSPITAL_COMMUNITY): Payer: Medicare Other | Admitting: Physical Therapy

## 2019-05-25 DIAGNOSIS — M6281 Muscle weakness (generalized): Secondary | ICD-10-CM

## 2019-05-25 DIAGNOSIS — M545 Low back pain: Secondary | ICD-10-CM

## 2019-05-25 DIAGNOSIS — R262 Difficulty in walking, not elsewhere classified: Secondary | ICD-10-CM | POA: Diagnosis not present

## 2019-05-25 DIAGNOSIS — G8929 Other chronic pain: Secondary | ICD-10-CM

## 2019-05-25 DIAGNOSIS — R2681 Unsteadiness on feet: Secondary | ICD-10-CM
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
## 2019-05-27 ENCOUNTER — Ambulatory Visit (HOSPITAL_COMMUNITY): Payer: Medicare Other

## 2019-05-27 ENCOUNTER — Other Ambulatory Visit: Payer: Self-pay

## 2019-05-27 ENCOUNTER — Encounter (HOSPITAL_COMMUNITY): Payer: Self-pay

## 2019-05-27 DIAGNOSIS — M6281 Muscle weakness (generalized): Secondary | ICD-10-CM

## 2019-05-27 DIAGNOSIS — R2681 Unsteadiness on feet: Secondary | ICD-10-CM

## 2019-05-27 DIAGNOSIS — R262 Difficulty in walking, not elsewhere classified: Secondary | ICD-10-CM | POA: Diagnosis not present
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
## 2019-05-31 ENCOUNTER — Ambulatory Visit (INDEPENDENT_AMBULATORY_CARE_PROVIDER_SITE_OTHER)
Admission: RE | Admit: 2019-05-31 | Discharge: 2019-05-31 | Disposition: A | Discharge status: Home / Self Care | Payer: Medicare Other | Source: Ambulatory Visit

## 2019-05-31 ENCOUNTER — Telehealth: Payer: Medicare Other

## 2019-05-31 DIAGNOSIS — N760 Acute vaginitis: Secondary | ICD-10-CM

## 2019-05-31 DIAGNOSIS — Z8742 Personal history of other diseases of the female genital tract: Secondary | ICD-10-CM

## 2019-05-31 MED ORDER — FLUCONAZOLE 150 MG PO TABS: 150.0000 mg | ORAL_TABLET | Freq: Once | ORAL | 0 refills | Status: AC

## 2019-05-31 MED ORDER — NYSTATIN 100000 UNIT/GM EX CREA: TOPICAL_CREAM | CUTANEOUS | 0 refills | Status: DC
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
## 2019-05-31 NOTE — Discharge Instructions (Signed)
Diflucan 1 tablet today, repeat in 4-5 days May try nystatin cream twice daily Keep area clean and dry, avoid sweating and rubbing- read attached Follow up if symptoms not resolving or not resolving

## 2019-06-21 NOTE — Telephone Encounter (Signed)
Called Dr. Freddie Apley office to f/u on referral, [place] for [facility].

## 2019-06-24 NOTE — Patient Instructions (Signed)
Joyce [name]  06/24/2019     @PREFPERIOPPHARMACY @   Your procedure is scheduled on  07/06/2019.  Report to [facility] at  1030  A.M.  Call this number if you have problems the morning of surgery:  [phone]   Remember:  Follow the diet and prep instructions given to you by Dr Oneida Alar office.                     Take these medicines the morning of surgery with A SIP OF WATER  Amlodipine, zyrtec, hydralazine, labetolol, oxycodone(if needed).    Do not wear jewelry, make-up or nail polish.  Do not wear lotions, powders, or perfumes. Please wear deodorant and brush your teeth.  Do not shave 48 hours prior to surgery.  Men may shave face and neck.  Do not bring valuables to the hospital.  [facility] is not responsible for any belongings or valuables.  Contacts, dentures or bridgework may not be worn into surgery.  Leave your suitcase in the car.  After surgery it may be brought to your room.  For patients admitted to the hospital, discharge time will be determined by your treatment team.  Patients discharged the day of surgery will not be allowed to drive home.   Name and phone number of your driver:   family Special instructions:  None  Please read over the following fact sheets that you were given. Anesthesia Post-op Instructions and Care and Recovery After Surgery       Colonoscopy, Adult, Care After This sheet gives you information about how to care for yourself after your procedure. Your health care provider may also give you more specific instructions. If you have problems or questions, contact your health care provider. What can I expect after the procedure? After the procedure, it is common to have:  A small amount of blood in your stool for 24 hours after the procedure.  Some gas.  Mild abdominal cramping or bloating. Follow these instructions at home: General instructions  For the first 24 hours after the procedure: ? Do not drive or use  machinery. ? Do not sign important documents. ? Do not drink alcohol. ? Do your regular daily activities at a slower pace than normal. ? Eat soft, easy-to-digest foods.  Take over-the-counter or prescription medicines only as told by your health care provider. Relieving cramping and bloating   Try walking around when you have cramps or feel bloated.  Apply heat to your abdomen as told by your health care provider. Use a heat source that your health care provider recommends, such as a moist heat pack or a heating pad. ? Place a towel between your skin and the heat source. ? Leave the heat on for 20-30 minutes. ? Remove the heat if your skin turns bright red. This is especially important if you are unable to feel pain, heat, or cold. You may have a greater risk of getting burned. Eating and drinking   Drink enough fluid to keep your urine pale yellow.  Resume your normal diet as instructed by your health care provider. Avoid heavy or fried foods that are hard to digest.  Avoid drinking alcohol for as long as instructed by your health care provider. Contact a health care provider if:  You have blood in your stool 2-3 days after the procedure. Get help right away if:  You have more than a small spotting of blood in your stool.  You pass large blood clots in your stool.  Your abdomen is swollen.  You have nausea or vomiting.  You have a fever.  You have increasing abdominal pain that is not relieved with medicine. Summary  After the procedure, it is common to have a small amount of blood in your stool. You may also have mild abdominal cramping and bloating.  For the first 24 hours after the procedure, do not drive or use machinery, sign important documents, or drink alcohol.  Contact your health care provider if you have a lot of blood in your stool, nausea or vomiting, a fever, or increased abdominal pain. This information is not intended to replace advice given to you by  your health care provider. Make sure you discuss any questions you have with your health care provider. Document Released: 03/05/2004 Document Revised: 05/14/2017 Document Reviewed: 10/03/2015 Elsevier Patient Education  2020 [place] After These instructions provide you with information about caring for yourself after your procedure. Your health care provider may also give you more specific instructions. Your treatment has been planned according to current medical practices, but problems sometimes occur. Call your health care provider if you have any problems or questions after your procedure. What can I expect after the procedure? After your procedure, you may:  Feel sleepy for several hours.  Feel clumsy and have poor balance for several hours.  Feel forgetful about what happened after the procedure.  Have poor judgment for several hours.  Feel nauseous or vomit.  Have a sore throat if you had a breathing tube during the procedure. Follow these instructions at home: For at least 24 hours after the procedure:      Have a responsible adult stay with you. It is important to have someone help care for you until you are awake and alert.  Rest as needed.  Do not: ? Participate in activities in which you could fall or become injured. ? Drive. ? Use heavy machinery. ? Drink alcohol. ? Take sleeping pills or medicines that cause drowsiness. ? Make important decisions or sign legal documents. ? Take care of children on your own. Eating and drinking  Follow the diet that is recommended by your health care provider.  If you vomit, drink water, juice, or soup when you can drink without vomiting.  Make sure you have little or no nausea before eating solid foods. General instructions  Take over-the-counter and prescription medicines only as told by your health care provider.  If you have sleep apnea, surgery and certain medicines can increase  your risk for breathing problems. Follow instructions from your health care provider about wearing your sleep device: ? Anytime you are sleeping, including during daytime naps. ? While taking prescription pain medicines, sleeping medicines, or medicines that make you drowsy.  If you smoke, do not smoke without supervision.  Keep all follow-up visits as told by your health care provider. This is important. Contact a health care provider if:  You keep feeling nauseous or you keep vomiting.  You feel light-headed.  You develop a rash.  You have a fever. Get help right away if:  You have trouble breathing. Summary  For several hours after your procedure, you may feel sleepy and have poor judgment.  Have a responsible adult stay with you for at least 24 hours or until you are awake and alert. This information is not intended to replace advice given to you by your health care provider. Make sure you  discuss any questions you have with your health care provider. Document Released: 11/12/2015 Document Revised: 10/20/2017 Document Reviewed: 11/12/2015 Elsevier Patient Education  2020 [employer].

## 2019-06-29 ENCOUNTER — Other Ambulatory Visit: Payer: Self-pay

## 2019-06-30 ENCOUNTER — Telehealth: Payer: Self-pay | Admitting: *Deleted

## 2019-06-30 ENCOUNTER — Other Ambulatory Visit: Payer: Self-pay

## 2019-06-30 ENCOUNTER — Encounter (HOSPITAL_COMMUNITY): Payer: Self-pay

## 2019-06-30 ENCOUNTER — Encounter (HOSPITAL_COMMUNITY)
Admission: RE | Admit: 2019-06-30 | Discharge: 2019-06-30 | Disposition: A | Discharge status: Home / Self Care | Payer: Medicare Other | Source: Ambulatory Visit | Attending: Gastroenterology | Admitting: Gastroenterology

## 2019-06-30 DIAGNOSIS — Z20828 Contact with and (suspected) exposure to other viral communicable diseases: Secondary | ICD-10-CM | POA: Insufficient documentation

## 2019-06-30 DIAGNOSIS — Z01818 Encounter for other preprocedural examination: Secondary | ICD-10-CM | POA: Diagnosis not present

## 2019-06-30 LAB — RAPID URINE DRUG SCREEN, HOSP PERFORMED
Amphetamines: NOT DETECTED
Barbiturates: NOT DETECTED
Benzodiazepines: NOT DETECTED
Cocaine: POSITIVE — AB
Opiates: NOT DETECTED
Tetrahydrocannabinol: NOT DETECTED

## 2019-06-30 LAB — CBC WITH DIFFERENTIAL/PLATELET
Abs Immature Granulocytes: 0.01 10*3/uL (ref 0.00–0.07)
Basophils Absolute: 0 10*3/uL (ref 0.0–0.1)
Basophils Relative: 1 %
Eosinophils Absolute: 0.6 10*3/uL — ABNORMAL HIGH (ref 0.0–0.5)
Eosinophils Relative: 8 %
HCT: 28.7 % — ABNORMAL LOW (ref 36.0–46.0)
Hemoglobin: 8.8 g/dL — ABNORMAL LOW (ref 12.0–15.0)
Immature Granulocytes: 0 %
Lymphocytes Relative: 29 %
Lymphs Abs: 2 10*3/uL (ref 0.7–4.0)
MCH: 26.3 pg (ref 26.0–34.0)
MCHC: 30.7 g/dL (ref 30.0–36.0)
MCV: 85.7 fL (ref 80.0–100.0)
Monocytes Absolute: 0.8 10*3/uL (ref 0.1–1.0)
Monocytes Relative: 11 %
Neutro Abs: 3.6 10*3/uL (ref 1.7–7.7)
Neutrophils Relative %: 51 %
Platelets: 302 10*3/uL (ref 150–400)
RBC: 3.35 MIL/uL — ABNORMAL LOW (ref 3.87–5.11)
RDW: 14.7 % (ref 11.5–15.5)
WBC: 7 10*3/uL (ref 4.0–10.5)
nRBC: 0 % (ref 0.0–0.2)

## 2019-06-30 LAB — BASIC METABOLIC PANEL
Anion gap: 12 (ref 5–15)
BUN: 43 mg/dL — ABNORMAL HIGH (ref 8–23)
CO2: 21 mmol/L — ABNORMAL LOW (ref 22–32)
Calcium: 8.4 mg/dL — ABNORMAL LOW (ref 8.9–10.3)
Chloride: 110 mmol/L (ref 98–111)
Creatinine, Ser: 2.55 mg/dL — ABNORMAL HIGH (ref 0.44–1.00)
GFR calc Af Amer: 22 mL/min — ABNORMAL LOW (ref >60)
GFR calc non Af Amer: 19 mL/min — ABNORMAL LOW (ref >60)
Glucose, Bld: 90 mg/dL (ref 70–99)
Potassium: 3.5 mmol/L (ref 3.5–5.1)
Sodium: 143 mmol/L (ref 135–145)

## 2019-06-30 NOTE — Telephone Encounter (Signed)
Received message from [facility] in Endo patient UDS was cocaine +. Dr. Hilaria Ota is on for next week and patient will need to cancel procedure. Patient will need a neg UDS.  I called patient and made aware. She stated she does not do drugs and has not touched any cocaine. She did not understand how she was positive. Patient stated she swears she does not do drugs. I placed patient on hold and called [facility] in endo. They will have patient come in on Monday 11/30 at 9:00am for UDS.   I called patient and made aware. She will go Monday for UDS. Pam in endo aware. FYI to [facility]

## 2019-07-02 ENCOUNTER — Other Ambulatory Visit (HOSPITAL_COMMUNITY)
Admission: RE | Admit: 2019-07-02 | Discharge: 2019-07-02 | Disposition: A | Discharge status: Home / Self Care | Payer: Medicare Other | Source: Ambulatory Visit | Attending: Gastroenterology | Admitting: Gastroenterology

## 2019-07-02 DIAGNOSIS — Z20828 Contact with and (suspected) exposure to other viral communicable diseases: Secondary | ICD-10-CM | POA: Insufficient documentation

## 2019-07-02 DIAGNOSIS — Z01812 Encounter for preprocedural laboratory examination: Secondary | ICD-10-CM | POA: Insufficient documentation

## 2019-07-02 LAB — SARS CORONAVIRUS 2 (TAT 6-24 HRS): SARS Coronavirus 2: NEGATIVE

## 2019-07-05 ENCOUNTER — Other Ambulatory Visit: Payer: Self-pay

## 2019-07-05 ENCOUNTER — Encounter (HOSPITAL_COMMUNITY)
Admission: RE | Admit: 2019-07-05 | Discharge: 2019-07-05 | Disposition: A | Discharge status: Home / Self Care | Payer: Medicare Other | Source: Ambulatory Visit | Attending: Gastroenterology | Admitting: Gastroenterology

## 2019-07-05 DIAGNOSIS — Z01812 Encounter for preprocedural laboratory examination: Secondary | ICD-10-CM | POA: Insufficient documentation

## 2019-07-05 DIAGNOSIS — Z87891 Personal history of nicotine dependence: Secondary | ICD-10-CM | POA: Insufficient documentation

## 2019-07-05 DIAGNOSIS — E785 Hyperlipidemia, unspecified: Secondary | ICD-10-CM | POA: Insufficient documentation

## 2019-07-05 DIAGNOSIS — I129 Hypertensive chronic kidney disease with stage 1 through stage 4 chronic kidney disease, or unspecified chronic kidney disease: Secondary | ICD-10-CM | POA: Diagnosis not present

## 2019-07-05 DIAGNOSIS — Z79899 Other long term (current) drug therapy: Secondary | ICD-10-CM | POA: Insufficient documentation

## 2019-07-05 DIAGNOSIS — D649 Anemia, unspecified: Secondary | ICD-10-CM | POA: Insufficient documentation

## 2019-07-05 DIAGNOSIS — N183 Chronic kidney disease, stage 3 unspecified: Secondary | ICD-10-CM | POA: Insufficient documentation

## 2019-07-05 LAB — RAPID URINE DRUG SCREEN, HOSP PERFORMED
Amphetamines: NOT DETECTED
Barbiturates: NOT DETECTED
Benzodiazepines: NOT DETECTED
Cocaine: NOT DETECTED
Opiates: NOT DETECTED
Tetrahydrocannabinol: NOT DETECTED

## 2019-07-05 NOTE — Telephone Encounter (Signed)
UDS NOV 30 NEGATIVE FOR COCAINE.

## 2019-07-05 NOTE — Telephone Encounter (Signed)
REVIEWED-NO ADDITIONAL RECOMMENDATIONS. 

## 2019-07-06 ENCOUNTER — Encounter (HOSPITAL_COMMUNITY)
Admission: RE | Disposition: A | Discharge status: Home / Self Care | Payer: Self-pay | Source: Home / Self Care | Attending: Gastroenterology

## 2019-07-06 ENCOUNTER — Encounter (HOSPITAL_COMMUNITY): Payer: Self-pay | Admitting: *Deleted

## 2019-07-06 ENCOUNTER — Other Ambulatory Visit: Payer: Self-pay

## 2019-07-06 ENCOUNTER — Ambulatory Visit (HOSPITAL_COMMUNITY): Payer: Medicare Other | Admitting: Anesthesiology

## 2019-07-06 ENCOUNTER — Ambulatory Visit (HOSPITAL_COMMUNITY)
Admission: RE | Admit: 2019-07-06 | Discharge: 2019-07-06 | Disposition: A | Discharge status: Home / Self Care | Payer: Medicare Other | Attending: Gastroenterology | Admitting: Gastroenterology

## 2019-07-06 DIAGNOSIS — E785 Hyperlipidemia, unspecified: Secondary | ICD-10-CM | POA: Insufficient documentation

## 2019-07-06 DIAGNOSIS — G709 Myoneural disorder, unspecified: Secondary | ICD-10-CM | POA: Diagnosis not present

## 2019-07-06 DIAGNOSIS — Z6841 Body Mass Index (BMI) 40.0 and over, adult: Secondary | ICD-10-CM | POA: Diagnosis not present

## 2019-07-06 DIAGNOSIS — Q438 Other specified congenital malformations of intestine: Secondary | ICD-10-CM | POA: Diagnosis not present

## 2019-07-06 DIAGNOSIS — M1990 Unspecified osteoarthritis, unspecified site: Secondary | ICD-10-CM | POA: Diagnosis not present

## 2019-07-06 DIAGNOSIS — K648 Other hemorrhoids: Secondary | ICD-10-CM | POA: Insufficient documentation

## 2019-07-06 DIAGNOSIS — Z8673 Personal history of transient ischemic attack (TIA), and cerebral infarction without residual deficits: Secondary | ICD-10-CM | POA: Insufficient documentation

## 2019-07-06 DIAGNOSIS — K449 Diaphragmatic hernia without obstruction or gangrene: Secondary | ICD-10-CM | POA: Diagnosis not present

## 2019-07-06 DIAGNOSIS — Z1211 Encounter for screening for malignant neoplasm of colon: Secondary | ICD-10-CM | POA: Insufficient documentation

## 2019-07-06 DIAGNOSIS — Z86010 Personal history of colonic polyps: Secondary | ICD-10-CM | POA: Diagnosis not present

## 2019-07-06 DIAGNOSIS — D631 Anemia in chronic kidney disease: Secondary | ICD-10-CM | POA: Diagnosis not present

## 2019-07-06 DIAGNOSIS — Z87891 Personal history of nicotine dependence: Secondary | ICD-10-CM | POA: Diagnosis not present

## 2019-07-06 DIAGNOSIS — K635 Polyp of colon: Secondary | ICD-10-CM

## 2019-07-06 DIAGNOSIS — Z96651 Presence of right artificial knee joint: Secondary | ICD-10-CM | POA: Insufficient documentation

## 2019-07-06 DIAGNOSIS — N183 Chronic kidney disease, stage 3 unspecified: Secondary | ICD-10-CM | POA: Diagnosis not present

## 2019-07-06 DIAGNOSIS — Z79899 Other long term (current) drug therapy: Secondary | ICD-10-CM | POA: Diagnosis not present

## 2019-07-06 DIAGNOSIS — I129 Hypertensive chronic kidney disease with stage 1 through stage 4 chronic kidney disease, or unspecified chronic kidney disease: Secondary | ICD-10-CM | POA: Insufficient documentation

## 2019-07-06 DIAGNOSIS — D649 Anemia, unspecified: Secondary | ICD-10-CM

## 2019-07-06 DIAGNOSIS — Z7902 Long term (current) use of antithrombotics/antiplatelets: Secondary | ICD-10-CM | POA: Insufficient documentation

## 2019-07-06 DIAGNOSIS — Z8249 Family history of ischemic heart disease and other diseases of the circulatory system: Secondary | ICD-10-CM | POA: Insufficient documentation

## 2019-07-06 DIAGNOSIS — D122 Benign neoplasm of ascending colon: Secondary | ICD-10-CM | POA: Diagnosis not present

## 2019-07-06 HISTORY — PX: BIOPSY: SHX5522

## 2019-07-06 HISTORY — PX: ESOPHAGOGASTRODUODENOSCOPY (EGD) WITH PROPOFOL: SHX5813

## 2019-07-06 HISTORY — PX: POLYPECTOMY: SHX5525

## 2019-07-06 HISTORY — PX: COLONOSCOPY WITH PROPOFOL: SHX5780

## 2019-07-06 SURGERY — COLONOSCOPY WITH PROPOFOL
Anesthesia: General

## 2019-07-06 MED ORDER — MIDAZOLAM HCL 2 MG/2ML IJ SOLN: 0.5000 mg | Freq: Once | INTRAMUSCULAR | Status: DC | PRN

## 2019-07-06 MED ORDER — ONDANSETRON HCL 4 MG/2ML IJ SOLN
INTRAMUSCULAR | Status: DC | PRN
  Administered 2019-07-06: 4 mg via INTRAVENOUS

## 2019-07-06 MED ORDER — HYDROMORPHONE HCL 1 MG/ML IJ SOLN: 0.2500 mg | INTRAMUSCULAR | Status: DC | PRN

## 2019-07-06 MED ORDER — MIDAZOLAM HCL 2 MG/2ML IJ SOLN
INTRAMUSCULAR | Status: DC | PRN
  Administered 2019-07-06 (×2): 1 mg via INTRAVENOUS

## 2019-07-06 MED ORDER — CHLORHEXIDINE GLUCONATE CLOTH 2 % EX PADS: 6.0000 | MEDICATED_PAD | Freq: Once | CUTANEOUS | Status: DC

## 2019-07-06 MED ORDER — MIDAZOLAM HCL 2 MG/2ML IJ SOLN
INTRAMUSCULAR | Status: AC
  Filled 2019-07-06: qty 2

## 2019-07-06 MED ORDER — LACTATED RINGERS IV SOLN
INTRAVENOUS | Status: DC
  Administered 2019-07-06: 1000 mL via INTRAVENOUS

## 2019-07-06 MED ORDER — PROPOFOL 500 MG/50ML IV EMUL
INTRAVENOUS | Status: DC | PRN
  Administered 2019-07-06: 100 ug/kg/min via INTRAVENOUS

## 2019-07-06 MED ORDER — PROPOFOL 10 MG/ML IV BOLUS
INTRAVENOUS | Status: DC | PRN
  Administered 2019-07-06 (×2): 50 ug via INTRAVENOUS

## 2019-07-06 MED ORDER — GLYCOPYRROLATE 0.2 MG/ML IJ SOLN
INTRAMUSCULAR | Status: DC | PRN
  Administered 2019-07-06: 0.2 mg via INTRAVENOUS

## 2019-07-06 MED ORDER — LIDOCAINE HCL (CARDIAC) PF 100 MG/5ML IV SOSY
PREFILLED_SYRINGE | INTRAVENOUS | Status: DC | PRN
  Administered 2019-07-06: 50 mg via INTRATRACHEAL

## 2019-07-06 MED ORDER — HYDROCODONE-ACETAMINOPHEN 7.5-325 MG PO TABS: 1.0000 | ORAL_TABLET | Freq: Once | ORAL | Status: DC | PRN

## 2019-07-06 MED ORDER — SODIUM CHLORIDE 0.9 % IV SOLN
INTRAVENOUS | Status: DC | PRN
  Administered 2019-07-06: 11:00:00 via INTRAVENOUS

## 2019-07-06 MED ORDER — PROMETHAZINE HCL 25 MG/ML IJ SOLN: 6.2500 mg | INTRAMUSCULAR | Status: DC | PRN

## 2019-07-06 MED ORDER — PROPOFOL 10 MG/ML IV BOLUS
INTRAVENOUS | Status: AC
  Filled 2019-07-06: qty 40

## 2019-07-06 NOTE — Op Note (Signed)
[facility] Patient Name: Joyce [name] Procedure Date: 07/06/2019 12:22 PM MRN: [id number] Date of Birth: 1956/01/24 Attending MD: Barney Drain MD, MD CSN: [id number] Age: 63 Admit Type: Outpatient Procedure:                Upper GI endoscopy WITH COLD FORCEPS BIOPSY Indications:              Anemia Providers:                Barney Drain MD, MD, Charlsie Quest. Theda Sers RN, RN,                            Aram Candela Referring MD:             Ralene Bathe. Dondiego, MD Medicines:                Propofol per Anesthesia Complications:            No immediate complications. Estimated Blood Loss:     Estimated blood loss was minimal. Procedure:                Pre-Anesthesia Assessment:                           - Prior to the procedure, a History and Physical                            was performed, and patient medications and                            allergies were reviewed. The patient's tolerance of                            previous anesthesia was also reviewed. The risks                            and benefits of the procedure and the sedation                            options and risks were discussed with the patient.                            All questions were answered, and informed consent                            was obtained. Prior Anticoagulants: The patient has                            taken Plavix (clopidogrel), last dose was 1 day                            prior to procedure. ASA Grade Assessment: III - A                            patient with severe systemic disease. After  reviewing the risks and benefits, the patient was                            deemed in satisfactory condition to undergo the                            procedure. After obtaining informed consent, the                            endoscope was passed under direct vision.                            Throughout the procedure, the patient's blood   pressure, pulse, and oxygen saturations were                            monitored continuously. The [id number]-[id number]                            scope was introduced through the mouth, and                            advanced to the second part of duodenum. The upper                            GI endoscopy was accomplished without difficulty.                            The patient tolerated the procedure well. Scope In: 12:23:58 PM Scope Out: 12:28:41 PM Total Procedure Duration: 0 hours 4 minutes 43 seconds  Findings:      The examined esophagus was normal.      A small hiatal hernia was present.      The entire examined stomach was normal. Biopsies were taken with a cold       forceps for ATROPHIC GASTRITIS.      The examined duodenum was normal. Impression:               - NO SOURCE FOR ANEMIA IDENTIFIED AND LIKELY DUE TO                            CHRONIC KIDNEY DISEASE                           - Small hiatal hernia. Moderate Sedation:      Per Anesthesia Care Recommendation:           - Patient has a contact number available for                            emergencies. The signs and symptoms of potential                            delayed complications were discussed with the  patient. Return to normal activities tomorrow.                            Written discharge instructions were provided to the                            patient.                           - High fiber diet.                           - Continue present medications.                           - Await pathology results.                           - Return to GI office in 6 months. Procedure Code(s):        --- Professional ---                           [phone], Esophagogastroduodenoscopy, flexible,                            transoral; with biopsy, single or multiple Diagnosis Code(s):        --- Professional ---                           K44.9, Diaphragmatic hernia without obstruction  or                            gangrene                           D64.9, Anemia, unspecified CPT copyright 2019 American Medical Association. All rights reserved. The codes documented in this report are preliminary and upon coder review may  be revised to meet current compliance requirements. Barney Drain, MD Barney Drain MD, MD 07/06/2019 12:44:25 PM This report has been signed electronically. Number of Addenda: 0

## 2019-07-06 NOTE — Anesthesia Preprocedure Evaluation (Signed)
Anesthesia Evaluation  Patient identified by MRN, date of birth, ID band Patient awake    Reviewed: Allergy & Precautions, NPO status , Patient's Chart, lab work & pertinent test results  Airway Mallampati: I  TM Distance: >3 FB Neck ROM: Full    Dental no notable dental hx. (+) Missing   Pulmonary pneumonia, resolved, former smoker,    Pulmonary exam normal breath sounds clear to auscultation       Cardiovascular Exercise Tolerance: Poor hypertension, Pt. on medications and Pt. 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  On plavix   Neuromuscular disease CVA, Residual Symptoms negative psych ROS   GI/Hepatic negative GI ROS, (+)     substance abuse  cocaine use, Had a cocaine + urine 06/30/2019  Had a negative after  Denies frequent drug use  Denies any h/o IVDA   Endo/Other  Morbid obesityBMI~40  Renal/GU Renal InsufficiencyRenal disease  negative genitourinary   Musculoskeletal  (+) Arthritis ,   Abdominal   Peds negative pediatric ROS (+)  Hematology negative hematology ROS (+) anemia ,   Anesthesia Other Findings   Reproductive/Obstetrics negative OB ROS                             Anesthesia Physical Anesthesia Plan  ASA: IV  Anesthesia Plan: General   Post-op Pain Management:    Induction: Intravenous  PONV Risk Score and Plan: 3 and TIVA, Propofol infusion, Ondansetron and Treatment may vary due to age or medical condition  Airway Management Planned: Simple Face Mask and Nasal Cannula  Additional Equipment:   Intra-op Plan:   Post-operative Plan:   Informed Consent: I have reviewed the patients History and Physical, chart, labs and discussed the procedure including the risks, benefits and alternatives for the proposed  anesthesia with the patient or authorized representative who has indicated his/her understanding and acceptance.     Dental advisory given  Plan Discussed with: CRNA  Anesthesia Plan Comments: (Plan Full PPE use  Plan GA with GETA as needed d/w pt -WTP with same after Q&A)        Anesthesia Quick Evaluation

## 2019-07-06 NOTE — Anesthesia Postprocedure Evaluation (Signed)
Anesthesia Post Note  Patient: Joyce [name]  Procedure(s) Performed: COLONOSCOPY WITH PROPOFOL (N/A ) ESOPHAGOGASTRODUODENOSCOPY (EGD) WITH PROPOFOL (N/A ) POLYPECTOMY BIOPSY  Patient location during evaluation: Endoscopy Anesthesia Type: General Level of consciousness: awake and alert Pain management: pain level controlled Vital Signs Assessment: post-procedure vital signs reviewed and stable Respiratory status: spontaneous breathing, nonlabored ventilation, respiratory function stable and patient connected to nasal cannula oxygen Cardiovascular status: blood pressure returned to baseline and stable Postop Assessment: no apparent nausea or vomiting Anesthetic complications: no     Last Vitals:  Vitals:   07/06/19 1245 07/06/19 1307  BP: (!) 127/56 130/60  Pulse: 67 62  Resp: 14 18  Temp:  36.5 C  SpO2: 100% 100%    Last Pain:  Vitals:   07/06/19 1307  TempSrc:   PainSc: [name]

## 2019-07-06 NOTE — Op Note (Signed)
[facility] Patient Name: Joyce [name] Procedure Date: 07/06/2019 11:34 AM MRN: [id number] Date of Birth: [birth year] Attending MD: Barney Drain MD, MD CSN: [id number] Age: 63 Admit Type: Outpatient Procedure:                Colonoscopy WITH COLD SNARE POLYPECTOMY Indications:              Personal history of colonic polyps, Anemia Providers:                Barney Drain MD, MD, Charlsie Quest. Theda Sers RN, RN,                            Aram Candela Referring MD:             Ralene Bathe. Dondiego, MD Medicines:                Propofol per Anesthesia Complications:            No immediate complications. Estimated Blood Loss:     Estimated blood loss was minimal. Procedure:                Pre-Anesthesia Assessment:                           - Prior to the procedure, a History and Physical                            was performed, and patient medications and                            allergies were reviewed. The patient's tolerance of                            previous anesthesia was also reviewed. The risks                            and benefits of the procedure and the sedation                            options and risks were discussed with the patient.                            All questions were answered, and informed consent                            was obtained. Prior Anticoagulants: The patient has                            taken Plavix (clopidogrel), last dose was 1 day                            prior to procedure. ASA Grade Assessment: III - A                            patient with severe systemic disease. After  reviewing the risks and benefits, the patient was                            deemed in satisfactory condition to undergo the                            procedure. After obtaining informed consent, the                            colonoscope was passed under direct vision.                            Throughout the procedure, the patient's  blood                            pressure, pulse, and oxygen saturations were                            monitored continuously. The [id number]-[id number]                            scope was introduced through the anus and advanced                            to the 10 cm into the ileum. The colonoscopy was                            somewhat difficult due to a tortuous colon.                            Successful completion of the procedure was aided by                            straightening and shortening the scope to obtain                            bowel loop reduction and COLOWRAP. The patient                            tolerated the procedure well. The quality of the                            bowel preparation was good. The terminal ileum,                            ileocecal valve, appendiceal orifice, and rectum                            were photographed. Scope In: 12:01:11 PM Scope Out: 12:19:56 PM Scope Withdrawal Time: 0 hours 14 minutes 2 seconds  Total Procedure Duration: 0 hours 18 minutes 45 seconds  Findings:      The terminal ileum appeared normal.      A 3 mm polyp was found in the mid  ascending colon. The polyp was       sessile. The polyp was removed with a cold snare. Resection and       retrieval were complete.      External hemorrhoids were found. The hemorrhoids were small.      The recto-sigmoid colon, sigmoid colon and descending colon were       moderately tortuous. Impression:               - The examined portion of the ileum was normal.                           - One 3 mm polyp in the mid ascending colon,                            removed with a cold snare. Resected and retrieved.                           - Internal hemorrhoids.                           - Tortuous LEFT colon. Moderate Sedation:      Per Anesthesia Care Recommendation:           - Patient has a contact number available for                            emergencies. The signs and  symptoms of potential                            delayed complications were discussed with the                            patient. Return to normal activities tomorrow.                            Written discharge instructions were provided to the                            patient.                           - High fiber diet.                           - Continue present medications.                           - Await pathology results.                           - Repeat colonoscopy in 5-10 years for surveillance.                           - Return to GI office in 6 months. Procedure Code(s):        --- Professional ---  45385, Colonoscopy, flexible; with removal of                            tumor(s), polyp(s), or other lesion(s) by snare                            technique Diagnosis Code(s):        --- Professional ---                           K64.8, Other hemorrhoids                           K63.5, Polyp of colon                           Z86.010, Personal history of colonic polyps                           D64.9, Anemia, unspecified                           Q43.8, Other specified congenital malformations of                            intestine CPT copyright 2019 American Medical Association. All rights reserved. The codes documented in this report are preliminary and upon coder review may  be revised to meet current compliance requirements. Barney Drain, MD Barney Drain MD, MD 07/06/2019 12:41:04 PM This report has been signed electronically. Number of Addenda: 0

## 2019-07-06 NOTE — Progress Notes (Signed)
CC'D TO PCP °

## 2019-07-06 NOTE — Transfer of Care (Signed)
Immediate Anesthesia Transfer of Care Note  Patient: Joyce [name]  Procedure(s) Performed: COLONOSCOPY WITH PROPOFOL (N/A ) ESOPHAGOGASTRODUODENOSCOPY (EGD) WITH PROPOFOL (N/A ) POLYPECTOMY BIOPSY  Patient Location: PACU  Anesthesia Type:General  Level of Consciousness: awake, alert  and oriented  Airway & Oxygen Therapy: Patient Spontanous Breathing  Post-op Assessment: Report given to RN, Post -op Vital signs reviewed and stable and Patient moving all extremities X 4  Post vital signs: Reviewed and stable  Last Vitals:  Vitals Value Taken Time  BP 109/50 07/06/19 1238  Temp    Pulse 86 07/06/19 1240  Resp 18 07/06/19 1240  SpO2 99 % 07/06/19 1240  Vitals shown include unvalidated device data.  Last Pain:  Vitals:   07/06/19 1140  TempSrc:   PainSc: 0-No pain         Complications: No apparent anesthesia complications

## 2019-07-06 NOTE — Discharge Instructions (Signed)
NO SOURCE FOR YOUR ANEMIA WAS IDENTIFIED. IT IS MOST LIKELY DUE TO YOUR KIDNEY DISEASE. You had 1 polyp removed. You have internal hemorrhoids. You have a SMALL HIATAL HERNIA. I biopsied your stomach.   EAT TO LIVE AND THINK OF FOOD AS MEDICINE. 75% OF YOUR PLATE SHOULD BE FRUITS/VEGGIES.  To have more energy, and to lose weight:      1. CONTINUE YOUR WEIGHT LOSS EFFORTS. I RECOMMEND YOU READ AND FOLLOW RECOMMENDATIONS BY DR. MARK HYMAN, "10-DAY DETOX DIET".    2. If you must eat bread, EAT EZEKIEL BREAD. IT IS IN THE FROZEN SECTION OF THE GROCERY STORE.    3. DRINK WATER WITH FRUIT OR CUCUMBER ADDED. YOUR URINE SHOULD BE LIGHT YELLOW. AVOID SODA, GATORADE, ENERGY DRINKS, OR DIET SODA.     4. AVOID HIGH FRUCTOSE CORN SYRUP AND CAFFEINE.     5. DO NOT chew SUGAR FREE GUM OR USE ARTIFICIAL SWEETENERS. IF NEEDED USE STEVIA AS A SWEETENER.    6. DO NOT EAT ENRICHED WHEAT FLOUR, PASTA, RICE, OR CEREAL.    7. ONLY EAT WILD CAUGHT SEAFOOD, GRASS FED BEEF OR CHICKEN, PORK FROM PASTURE RAISE PIGS, OR EGGS FROM PASTURE RAISED CHICKENS.    8. PRACTICE CHAIR YOGA FOR 15-30 MINS 3 OR 4 TIMES A WEEK AND PROGRESS TO HATHA YOGA OVER NEXT 6 MOS.    9. START TAKING A MULTIVITAMIN, VITAMIN B12, AND VITAMIN D3 2000 IU DAILY.   ADDITIONAL SUPPLEMENTS TO DECREASE CRAVING AND SUPPRESS YOUR APPETITE:    1. CINNAMON 500 MG EVERY AM PRIOR TO FIRST MEAL.   **STABILIZES BLOOD GLUCOSE/REDUCES CRAVINGS**    2. CHROMIUM 400-500 MG WITH MEALS TWICE DAILY.    **FAT BURNER**    3. GREEN TEA EXTRACT ONE DAILY.   **FAT BURNER/SUPPRESSES YOUR APPETITE**    4. ALPHA LIPOIC ACID TWICE DAILY.   **NATURAL ANTI-INFLAMMATORY SUPPLEMENT THAT IS AN ALTERNATIVE TO IBUPROFEN OR NAPROXEN**  YOUR BIOPSY RESULTS WILL BE BACK IN 5 BUSINESS DAYS.  FOLLOW UP IN 6 MOS. PLEASE CALL WITH QUESTIONS OR CONCERNS.   ENDOSCOPY Care After Read the instructions outlined below and refer to this sheet in the next week. These  discharge instructions provide you with general information on caring for yourself after you leave the hospital. While your treatment has been planned according to the most current medical practices available, unavoidable complications occasionally occur. If you have any problems or questions after discharge, call DR. Yarden Hillis, [phone].  ACTIVITY  You may resume your regular activity, but move at a slower pace for the next 24 hours.   Take frequent rest periods for the next 24 hours.   Walking will help get rid of the air and reduce the bloated feeling in your belly (abdomen).   No driving for 24 hours (because of the medicine (anesthesia) used during the test).   You may shower.   Do not sign any important legal documents or operate any machinery for 24 hours (because of the anesthesia used during the test).    NUTRITION  Drink plenty of fluids.   You may resume your normal diet as instructed by your doctor.   Begin with a light meal and progress to your normal diet. Heavy or fried foods are harder to digest and may make you feel sick to your stomach (nauseated).   Avoid alcoholic beverages for 24 hours or as instructed.    MEDICATIONS  You may resume your normal medications.   WHAT YOU CAN EXPECT TODAY  Some feelings of bloating in the abdomen.   Passage of more gas than usual.   Spotting of blood in your stool or on the toilet paper  .  IF YOU HAD POLYPS REMOVED DURING THE ENDOSCOPY:  Eat a soft diet IF YOU HAVE NAUSEA, BLOATING, ABDOMINAL PAIN, OR VOMITING.    FINDING OUT THE RESULTS OF YOUR TEST Not all test results are available during your visit. DR. Oneida Alar WILL CALL YOU WITHIN 7 DAYS OF YOUR PROCEDUE WITH YOUR RESULTS. Do not assume everything is normal if you have not heard from DR. Dedrick Heffner IN ONE WEEK, CALL HER OFFICE AT [phone].  SEEK IMMEDIATE MEDICAL ATTENTION AND CALL THE OFFICE: [phone] IF:  You have more than a spotting of blood in your  stool.   Your belly is swollen (abdominal distention).   You are nauseated or vomiting.   You have a temperature over 101F.   You have abdominal pain or discomfort that is severe or gets worse throughout the day.  Polyps, Colon  A polyp is extra tissue that grows inside your body. Colon polyps grow in the large intestine. The large intestine, also called the colon, is part of your digestive system. It is a long, hollow tube at the end of your digestive tract where your body makes and stores stool. Most polyps are not dangerous. They are benign. This means they are not cancerous. But over time, some types of polyps can turn into cancer. Polyps that are smaller than a pea are usually not harmful. But larger polyps could someday become or may already be cancerous. To be safe, doctors remove all polyps and test them.   PREVENTION There is not one sure way to prevent polyps. You might be able to lower your risk of getting them if you:  Eat more fruits and vegetables and less fatty food.   Do not smoke.   Avoid alcohol.   Exercise every day.   Lose weight if you are overweight.   Eating more calcium and folate can also lower your risk of getting polyps. Some foods that are rich in calcium are milk, cheese, and broccoli. Some foods that are rich in folate are chickpeas, kidney beans, and spinach.        Hiatal Hernia A hiatal hernia occurs when a part of the stomach slides above the diaphragm. The diaphragm is the thin muscle separating the belly (abdomen) from the chest. A hiatal hernia can be something you are born with or develop over time. Hiatal hernias may allow stomach acid to flow back into your esophagus, the tube which carries food from your mouth to your stomach. If this acid causes problems it is called GERD (gastro-esophageal reflux disease).
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
## 2019-07-07 ENCOUNTER — Other Ambulatory Visit: Payer: Self-pay

## 2019-07-07 LAB — SURGICAL PATHOLOGY

## 2019-07-08 ENCOUNTER — Encounter: Payer: Self-pay | Admitting: Gastroenterology

## 2019-07-08 ENCOUNTER — Encounter (HOSPITAL_COMMUNITY): Payer: Self-pay | Admitting: Gastroenterology

## 2019-07-08 NOTE — Progress Notes (Signed)
PATIENT SCHEDULED AND ON RECALL  °

## 2019-07-14 ENCOUNTER — Encounter (HOSPITAL_COMMUNITY): Payer: Self-pay | Admitting: Gastroenterology

## 2019-07-29 ENCOUNTER — Inpatient Hospital Stay (HOSPITAL_COMMUNITY)
Admission: EM | Admit: 2019-07-29 | Discharge: 2019-08-02 | DRG: 683 | Disposition: A | Discharge status: Home / Self Care | Payer: Medicare Other | Attending: Family Medicine | Admitting: Family Medicine

## 2019-07-29 ENCOUNTER — Observation Stay (HOSPITAL_COMMUNITY): Payer: Medicare Other

## 2019-07-29 ENCOUNTER — Emergency Department (HOSPITAL_COMMUNITY): Payer: Medicare Other

## 2019-07-29 ENCOUNTER — Encounter (HOSPITAL_COMMUNITY): Payer: Self-pay | Admitting: *Deleted

## 2019-07-29 ENCOUNTER — Other Ambulatory Visit: Payer: Self-pay

## 2019-07-29 DIAGNOSIS — N390 Urinary tract infection, site not specified: Secondary | ICD-10-CM | POA: Diagnosis present

## 2019-07-29 DIAGNOSIS — Z20828 Contact with and (suspected) exposure to other viral communicable diseases: Secondary | ICD-10-CM | POA: Diagnosis present

## 2019-07-29 DIAGNOSIS — E785 Hyperlipidemia, unspecified: Secondary | ICD-10-CM | POA: Diagnosis present

## 2019-07-29 DIAGNOSIS — Z7902 Long term (current) use of antithrombotics/antiplatelets: Secondary | ICD-10-CM

## 2019-07-29 DIAGNOSIS — D631 Anemia in chronic kidney disease: Secondary | ICD-10-CM | POA: Diagnosis present

## 2019-07-29 DIAGNOSIS — I10 Essential (primary) hypertension: Secondary | ICD-10-CM | POA: Diagnosis present

## 2019-07-29 DIAGNOSIS — N179 Acute kidney failure, unspecified: Secondary | ICD-10-CM | POA: Diagnosis present

## 2019-07-29 DIAGNOSIS — T464X5A Adverse effect of angiotensin-converting-enzyme inhibitors, initial encounter: Secondary | ICD-10-CM | POA: Diagnosis present

## 2019-07-29 DIAGNOSIS — R0600 Dyspnea, unspecified: Secondary | ICD-10-CM | POA: Diagnosis present

## 2019-07-29 DIAGNOSIS — N183 Chronic kidney disease, stage 3 unspecified: Secondary | ICD-10-CM | POA: Diagnosis present

## 2019-07-29 DIAGNOSIS — Z96651 Presence of right artificial knee joint: Secondary | ICD-10-CM | POA: Diagnosis present

## 2019-07-29 DIAGNOSIS — Z8673 Personal history of transient ischemic attack (TIA), and cerebral infarction without residual deficits: Secondary | ICD-10-CM

## 2019-07-29 DIAGNOSIS — Z87891 Personal history of nicotine dependence: Secondary | ICD-10-CM

## 2019-07-29 DIAGNOSIS — T502X5A Adverse effect of carbonic-anhydrase inhibitors, benzothiadiazides and other diuretics, initial encounter: Secondary | ICD-10-CM | POA: Diagnosis present

## 2019-07-29 DIAGNOSIS — N1832 Chronic kidney disease, stage 3b: Secondary | ICD-10-CM | POA: Diagnosis present

## 2019-07-29 DIAGNOSIS — I129 Hypertensive chronic kidney disease with stage 1 through stage 4 chronic kidney disease, or unspecified chronic kidney disease: Secondary | ICD-10-CM | POA: Diagnosis present

## 2019-07-29 DIAGNOSIS — Z981 Arthrodesis status: Secondary | ICD-10-CM

## 2019-07-29 DIAGNOSIS — Z79899 Other long term (current) drug therapy: Secondary | ICD-10-CM

## 2019-07-29 DIAGNOSIS — N170 Acute kidney failure with tubular necrosis: Principal | ICD-10-CM | POA: Diagnosis present

## 2019-07-29 DIAGNOSIS — Z8249 Family history of ischemic heart disease and other diseases of the circulatory system: Secondary | ICD-10-CM

## 2019-07-29 DIAGNOSIS — R509 Fever, unspecified: Secondary | ICD-10-CM

## 2019-07-29 DIAGNOSIS — T501X5A Adverse effect of loop [high-ceiling] diuretics, initial encounter: Secondary | ICD-10-CM | POA: Diagnosis present

## 2019-07-29 DIAGNOSIS — Z79891 Long term (current) use of opiate analgesic: Secondary | ICD-10-CM

## 2019-07-29 DIAGNOSIS — Y92009 Unspecified place in unspecified non-institutional (private) residence as the place of occurrence of the external cause: Secondary | ICD-10-CM

## 2019-07-29 DIAGNOSIS — I959 Hypotension, unspecified: Secondary | ICD-10-CM | POA: Diagnosis present

## 2019-07-29 DIAGNOSIS — I69351 Hemiplegia and hemiparesis following cerebral infarction affecting right dominant side: Secondary | ICD-10-CM

## 2019-07-29 DIAGNOSIS — G894 Chronic pain syndrome: Secondary | ICD-10-CM | POA: Diagnosis present

## 2019-07-29 DIAGNOSIS — B349 Viral infection, unspecified: Secondary | ICD-10-CM

## 2019-07-29 LAB — BASIC METABOLIC PANEL
Anion gap: 13 (ref 5–15)
BUN: 29 mg/dL — ABNORMAL HIGH (ref 8–23)
CO2: 21 mmol/L — ABNORMAL LOW (ref 22–32)
Calcium: 8.7 mg/dL — ABNORMAL LOW (ref 8.9–10.3)
Chloride: 102 mmol/L (ref 98–111)
Creatinine, Ser: 3.8 mg/dL — ABNORMAL HIGH (ref 0.44–1.00)
GFR calc Af Amer: 14 mL/min — ABNORMAL LOW (ref >60)
GFR calc non Af Amer: 12 mL/min — ABNORMAL LOW (ref >60)
Glucose, Bld: 121 mg/dL — ABNORMAL HIGH (ref 70–99)
Potassium: 3.4 mmol/L — ABNORMAL LOW (ref 3.5–5.1)
Sodium: 136 mmol/L (ref 135–145)

## 2019-07-29 LAB — CBC WITH DIFFERENTIAL/PLATELET
Abs Immature Granulocytes: 0.05 10*3/uL (ref 0.00–0.07)
Basophils Absolute: 0 10*3/uL (ref 0.0–0.1)
Basophils Relative: 0 %
Eosinophils Absolute: 0 10*3/uL (ref 0.0–0.5)
Eosinophils Relative: 0 %
HCT: 31 % — ABNORMAL LOW (ref 36.0–46.0)
Hemoglobin: 9.6 g/dL — ABNORMAL LOW (ref 12.0–15.0)
Immature Granulocytes: 1 %
Lymphocytes Relative: 13 %
Lymphs Abs: 1.3 10*3/uL (ref 0.7–4.0)
MCH: 25.1 pg — ABNORMAL LOW (ref 26.0–34.0)
MCHC: 31 g/dL (ref 30.0–36.0)
MCV: 81.2 fL (ref 80.0–100.0)
Monocytes Absolute: 1 10*3/uL (ref 0.1–1.0)
Monocytes Relative: 10 %
Neutro Abs: 7.7 10*3/uL (ref 1.7–7.7)
Neutrophils Relative %: 76 %
Platelets: 433 10*3/uL — ABNORMAL HIGH (ref 150–400)
RBC: 3.82 MIL/uL — ABNORMAL LOW (ref 3.87–5.11)
RDW: 14.5 % (ref 11.5–15.5)
WBC: 10.1 10*3/uL (ref 4.0–10.5)
nRBC: 0 % (ref 0.0–0.2)

## 2019-07-29 LAB — D-DIMER, QUANTITATIVE: D-Dimer, Quant: 2.92 ug/mL-FEU — ABNORMAL HIGH (ref 0.00–0.50)

## 2019-07-29 LAB — LIPASE, BLOOD: Lipase: 29 U/L (ref 11–51)

## 2019-07-29 LAB — HEPATIC FUNCTION PANEL
ALT: 11 U/L (ref 0–44)
AST: 17 U/L (ref 15–41)
Albumin: 3.8 g/dL (ref 3.5–5.0)
Alkaline Phosphatase: 84 U/L (ref 38–126)
Bilirubin, Direct: 0.1 mg/dL (ref 0.0–0.2)
Indirect Bilirubin: 0.7 mg/dL (ref 0.3–0.9)
Total Bilirubin: 0.8 mg/dL (ref 0.3–1.2)
Total Protein: 7.5 g/dL (ref 6.5–8.1)

## 2019-07-29 LAB — POC SARS CORONAVIRUS 2 AG -  ED: SARS Coronavirus 2 Ag: NEGATIVE

## 2019-07-29 LAB — BRAIN NATRIURETIC PEPTIDE: B Natriuretic Peptide: 48 pg/mL (ref 0.0–100.0)

## 2019-07-29 LAB — RESPIRATORY PANEL BY RT PCR (FLU A&B, COVID)
Influenza A by PCR: NEGATIVE
Influenza B by PCR: NEGATIVE
SARS Coronavirus 2 by RT PCR: NEGATIVE

## 2019-07-29 MED ORDER — ACETAMINOPHEN 325 MG PO TABS
650.0000 mg | ORAL_TABLET | Freq: Once | ORAL | Status: AC
  Administered 2019-07-29: 16:00:00 650 mg via ORAL
  Filled 2019-07-29: qty 2

## 2019-07-29 MED ORDER — SODIUM CHLORIDE 0.9 % IV BOLUS
1000.0000 mL | Freq: Once | INTRAVENOUS | Status: AC
  Administered 2019-07-29: 1000 mL via INTRAVENOUS

## 2019-07-29 MED ORDER — ONDANSETRON HCL 4 MG/2ML IJ SOLN
4.0000 mg | Freq: Once | INTRAMUSCULAR | Status: AC
  Administered 2019-07-29: 14:00:00 4 mg via INTRAVENOUS
  Filled 2019-07-29: qty 2

## 2019-07-29 MED ORDER — OXYCODONE HCL 5 MG PO TABS
5.0000 mg | ORAL_TABLET | ORAL | Status: DC | PRN
  Administered 2019-07-29: 5 mg via ORAL
  Filled 2019-07-29: qty 1

## 2019-07-29 MED ORDER — MORPHINE SULFATE (PF) 4 MG/ML IV SOLN
4.0000 mg | Freq: Once | INTRAVENOUS | Status: AC
  Administered 2019-07-29: 14:00:00 4 mg via INTRAVENOUS
  Filled 2019-07-29: qty 1

## 2019-07-29 MED ORDER — TECHNETIUM TO 99M ALBUMIN AGGREGATED
1.5000 | Freq: Once | INTRAVENOUS | Status: AC | PRN
  Administered 2019-07-29: 1.5 via INTRAVENOUS
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
## 2019-07-29 NOTE — ED Triage Notes (Signed)
C/o dizziness, headache, shortness of breath for 4 days
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
## 2019-07-29 NOTE — ED Notes (Signed)
Pt given ginger ale to drink. 

## 2019-07-30 ENCOUNTER — Observation Stay (HOSPITAL_COMMUNITY): Payer: Medicare Other

## 2019-07-30 ENCOUNTER — Inpatient Hospital Stay (HOSPITAL_COMMUNITY): Payer: Medicare Other

## 2019-07-30 DIAGNOSIS — N179 Acute kidney failure, unspecified: Secondary | ICD-10-CM | POA: Diagnosis present

## 2019-07-30 DIAGNOSIS — N1832 Chronic kidney disease, stage 3b: Secondary | ICD-10-CM

## 2019-07-30 DIAGNOSIS — I10 Essential (primary) hypertension: Secondary | ICD-10-CM | POA: Diagnosis not present

## 2019-07-30 DIAGNOSIS — Z87891 Personal history of nicotine dependence: Secondary | ICD-10-CM | POA: Diagnosis not present

## 2019-07-30 DIAGNOSIS — T464X5A Adverse effect of angiotensin-converting-enzyme inhibitors, initial encounter: Secondary | ICD-10-CM | POA: Diagnosis present

## 2019-07-30 DIAGNOSIS — Z981 Arthrodesis status: Secondary | ICD-10-CM | POA: Diagnosis not present

## 2019-07-30 DIAGNOSIS — Z8249 Family history of ischemic heart disease and other diseases of the circulatory system: Secondary | ICD-10-CM | POA: Diagnosis not present

## 2019-07-30 DIAGNOSIS — I959 Hypotension, unspecified: Secondary | ICD-10-CM | POA: Diagnosis present

## 2019-07-30 DIAGNOSIS — T502X5A Adverse effect of carbonic-anhydrase inhibitors, benzothiadiazides and other diuretics, initial encounter: Secondary | ICD-10-CM | POA: Diagnosis present

## 2019-07-30 DIAGNOSIS — D631 Anemia in chronic kidney disease: Secondary | ICD-10-CM | POA: Diagnosis present

## 2019-07-30 DIAGNOSIS — N170 Acute kidney failure with tubular necrosis: Secondary | ICD-10-CM | POA: Diagnosis present

## 2019-07-30 DIAGNOSIS — Y92009 Unspecified place in unspecified non-institutional (private) residence as the place of occurrence of the external cause: Secondary | ICD-10-CM | POA: Diagnosis not present

## 2019-07-30 DIAGNOSIS — Z79899 Other long term (current) drug therapy: Secondary | ICD-10-CM | POA: Diagnosis not present

## 2019-07-30 DIAGNOSIS — Z96651 Presence of right artificial knee joint: Secondary | ICD-10-CM | POA: Diagnosis present

## 2019-07-30 DIAGNOSIS — Z7902 Long term (current) use of antithrombotics/antiplatelets: Secondary | ICD-10-CM | POA: Diagnosis not present

## 2019-07-30 DIAGNOSIS — Z79891 Long term (current) use of opiate analgesic: Secondary | ICD-10-CM | POA: Diagnosis not present

## 2019-07-30 DIAGNOSIS — G894 Chronic pain syndrome: Secondary | ICD-10-CM | POA: Diagnosis present

## 2019-07-30 DIAGNOSIS — Z8673 Personal history of transient ischemic attack (TIA), and cerebral infarction without residual deficits: Secondary | ICD-10-CM | POA: Diagnosis not present

## 2019-07-30 DIAGNOSIS — T501X5A Adverse effect of loop [high-ceiling] diuretics, initial encounter: Secondary | ICD-10-CM | POA: Diagnosis present

## 2019-07-30 DIAGNOSIS — E785 Hyperlipidemia, unspecified: Secondary | ICD-10-CM | POA: Diagnosis present

## 2019-07-30 DIAGNOSIS — I129 Hypertensive chronic kidney disease with stage 1 through stage 4 chronic kidney disease, or unspecified chronic kidney disease: Secondary | ICD-10-CM | POA: Diagnosis present

## 2019-07-30 DIAGNOSIS — N183 Chronic kidney disease, stage 3 unspecified: Secondary | ICD-10-CM | POA: Diagnosis present

## 2019-07-30 DIAGNOSIS — N390 Urinary tract infection, site not specified: Secondary | ICD-10-CM | POA: Diagnosis present

## 2019-07-30 DIAGNOSIS — R0600 Dyspnea, unspecified: Secondary | ICD-10-CM | POA: Diagnosis present

## 2019-07-30 DIAGNOSIS — Z20828 Contact with and (suspected) exposure to other viral communicable diseases: Secondary | ICD-10-CM | POA: Diagnosis present

## 2019-07-30 DIAGNOSIS — I69351 Hemiplegia and hemiparesis following cerebral infarction affecting right dominant side: Secondary | ICD-10-CM | POA: Diagnosis not present

## 2019-07-30 LAB — BASIC METABOLIC PANEL
Anion gap: 13 (ref 5–15)
BUN: 36 mg/dL — ABNORMAL HIGH (ref 8–23)
CO2: 21 mmol/L — ABNORMAL LOW (ref 22–32)
Calcium: 8.3 mg/dL — ABNORMAL LOW (ref 8.9–10.3)
Chloride: 103 mmol/L (ref 98–111)
Creatinine, Ser: 5.56 mg/dL — ABNORMAL HIGH (ref 0.44–1.00)
GFR calc Af Amer: 9 mL/min — ABNORMAL LOW (ref >60)
GFR calc non Af Amer: 8 mL/min — ABNORMAL LOW (ref >60)
Glucose, Bld: 122 mg/dL — ABNORMAL HIGH (ref 70–99)
Potassium: 3.4 mmol/L — ABNORMAL LOW (ref 3.5–5.1)
Sodium: 137 mmol/L (ref 135–145)

## 2019-07-30 LAB — CBC
HCT: 27.1 % — ABNORMAL LOW (ref 36.0–46.0)
Hemoglobin: 8.4 g/dL — ABNORMAL LOW (ref 12.0–15.0)
MCH: 25.4 pg — ABNORMAL LOW (ref 26.0–34.0)
MCHC: 31 g/dL (ref 30.0–36.0)
MCV: 81.9 fL (ref 80.0–100.0)
Platelets: 325 10*3/uL (ref 150–400)
RBC: 3.31 MIL/uL — ABNORMAL LOW (ref 3.87–5.11)
RDW: 14.7 % (ref 11.5–15.5)
WBC: 9.8 10*3/uL (ref 4.0–10.5)
nRBC: 0 % (ref 0.0–0.2)

## 2019-07-30 LAB — SARS CORONAVIRUS 2 (TAT 6-24 HRS): SARS Coronavirus 2: NEGATIVE

## 2019-07-30 MED ORDER — ATORVASTATIN CALCIUM 40 MG PO TABS
40.0000 mg | ORAL_TABLET | Freq: Every evening | ORAL | Status: DC
  Administered 2019-07-30 – 2019-08-01 (×3): 40 mg via ORAL
  Filled 2019-07-30 (×3): qty 1

## 2019-07-30 MED ORDER — GABAPENTIN 300 MG PO CAPS: 300.0000 mg | ORAL_CAPSULE | Freq: Three times a day (TID) | ORAL | Status: DC

## 2019-07-30 MED ORDER — TRAZODONE HCL 50 MG PO TABS
50.0000 mg | ORAL_TABLET | Freq: Every day | ORAL | Status: DC
  Administered 2019-07-30: 50 mg via ORAL
  Filled 2019-07-30: qty 1

## 2019-07-30 MED ORDER — SODIUM CHLORIDE 0.9 % IV SOLN: INTRAVENOUS | Status: DC

## 2019-07-30 MED ORDER — LABETALOL HCL 200 MG PO TABS
100.0000 mg | ORAL_TABLET | Freq: Two times a day (BID) | ORAL | Status: DC
  Administered 2019-07-30 – 2019-08-02 (×7): 100 mg via ORAL
  Filled 2019-07-30 (×7): qty 1

## 2019-07-30 MED ORDER — LORATADINE 10 MG PO TABS
10.0000 mg | ORAL_TABLET | Freq: Every day | ORAL | Status: DC
  Administered 2019-07-30 – 2019-08-02 (×4): 10 mg via ORAL
  Filled 2019-07-30 (×4): qty 1

## 2019-07-30 MED ORDER — CLONIDINE HCL 0.2 MG/24HR TD PTWK
0.2000 mg | MEDICATED_PATCH | TRANSDERMAL | Status: DC
  Filled 2019-07-30: qty 1

## 2019-07-30 MED ORDER — TRAZODONE HCL 50 MG PO TABS: 50.0000 mg | ORAL_TABLET | Freq: Every evening | ORAL | Status: DC | PRN

## 2019-07-30 MED ORDER — OXYCODONE HCL 5 MG PO TABS: 5.0000 mg | ORAL_TABLET | ORAL | Status: DC | PRN

## 2019-07-30 MED ORDER — LABETALOL HCL 200 MG PO TABS: 300.0000 mg | ORAL_TABLET | Freq: Three times a day (TID) | ORAL | Status: DC

## 2019-07-30 MED ORDER — HYDRALAZINE HCL 25 MG PO TABS
25.0000 mg | ORAL_TABLET | Freq: Three times a day (TID) | ORAL | Status: DC
  Administered 2019-07-30 – 2019-08-02 (×9): 25 mg via ORAL
  Filled 2019-07-30 (×9): qty 1

## 2019-07-30 MED ORDER — ACETAMINOPHEN 650 MG RE SUPP: 650.0000 mg | Freq: Four times a day (QID) | RECTAL | Status: DC | PRN

## 2019-07-30 MED ORDER — AMLODIPINE BESYLATE 5 MG PO TABS: 10.0000 mg | ORAL_TABLET | Freq: Every day | ORAL | Status: DC

## 2019-07-30 MED ORDER — ONDANSETRON HCL 4 MG PO TABS: 4.0000 mg | ORAL_TABLET | Freq: Four times a day (QID) | ORAL | Status: DC | PRN

## 2019-07-30 MED ORDER — VITAMIN D3 25 MCG PO TABS
5000.0000 [IU] | ORAL_TABLET | Freq: Every day | ORAL | Status: DC
  Administered 2019-07-30 – 2019-08-02 (×4): 5000 [IU] via ORAL
  Filled 2019-07-30 (×9): qty 5

## 2019-07-30 MED ORDER — SODIUM CHLORIDE 0.9 % IV SOLN: 250.0000 mL | INTRAVENOUS | Status: DC | PRN

## 2019-07-30 MED ORDER — OXYCODONE-ACETAMINOPHEN 10-325 MG PO TABS: 1.0000 | ORAL_TABLET | Freq: Four times a day (QID) | ORAL | Status: DC | PRN

## 2019-07-30 MED ORDER — EZETIMIBE 10 MG PO TABS
10.0000 mg | ORAL_TABLET | Freq: Every morning | ORAL | Status: DC
  Administered 2019-07-30 – 2019-08-02 (×4): 10 mg via ORAL
  Filled 2019-07-30 (×7): qty 1

## 2019-07-30 MED ORDER — OXYCODONE HCL 5 MG PO TABS
5.0000 mg | ORAL_TABLET | Freq: Four times a day (QID) | ORAL | Status: DC | PRN
  Administered 2019-07-30 – 2019-07-31 (×5): 5 mg via ORAL
  Filled 2019-07-30 (×5): qty 1

## 2019-07-30 MED ORDER — HEPARIN SODIUM (PORCINE) 5000 UNIT/ML IJ SOLN
5000.0000 [IU] | Freq: Three times a day (TID) | INTRAMUSCULAR | Status: DC
  Administered 2019-07-30 – 2019-08-02 (×10): 5000 [IU] via SUBCUTANEOUS
  Filled 2019-07-30 (×11): qty 1

## 2019-07-30 MED ORDER — CLOPIDOGREL BISULFATE 75 MG PO TABS
75.0000 mg | ORAL_TABLET | Freq: Every day | ORAL | Status: DC
  Administered 2019-07-30 – 2019-08-02 (×4): 75 mg via ORAL
  Filled 2019-07-30 (×4): qty 1

## 2019-07-30 MED ORDER — SODIUM CHLORIDE 0.9% FLUSH
3.0000 mL | Freq: Two times a day (BID) | INTRAVENOUS | Status: DC
  Administered 2019-08-02: 3 mL via INTRAVENOUS

## 2019-07-30 MED ORDER — ACETAMINOPHEN 325 MG PO TABS: 650.0000 mg | ORAL_TABLET | Freq: Four times a day (QID) | ORAL | Status: DC | PRN

## 2019-07-30 MED ORDER — ONDANSETRON HCL 4 MG/2ML IJ SOLN: 4.0000 mg | Freq: Four times a day (QID) | INTRAMUSCULAR | Status: DC | PRN

## 2019-07-30 MED ORDER — ATORVASTATIN CALCIUM 10 MG PO TABS: 10.0000 mg | ORAL_TABLET | Freq: Every evening | ORAL | Status: DC

## 2019-07-30 MED ORDER — OXYCODONE-ACETAMINOPHEN 5-325 MG PO TABS: 1.0000 | ORAL_TABLET | ORAL | Status: DC | PRN

## 2019-07-30 MED ORDER — HYDRALAZINE HCL 25 MG PO TABS: 50.0000 mg | ORAL_TABLET | Freq: Three times a day (TID) | ORAL | Status: DC

## 2019-07-30 MED ORDER — ALBUTEROL SULFATE (2.5 MG/3ML) 0.083% IN NEBU: 2.5000 mg | INHALATION_SOLUTION | RESPIRATORY_TRACT | Status: DC | PRN

## 2019-07-30 MED ORDER — POLYETHYLENE GLYCOL 3350 17 G PO PACK: 17.0000 g | PACK | Freq: Every day | ORAL | Status: DC | PRN

## 2019-07-30 MED ORDER — CLONIDINE HCL 0.2 MG/24HR TD PTWK: 0.2000 mg | MEDICATED_PATCH | TRANSDERMAL | Status: DC

## 2019-07-30 MED ORDER — GABAPENTIN 100 MG PO CAPS
100.0000 mg | ORAL_CAPSULE | Freq: Three times a day (TID) | ORAL | Status: DC
  Administered 2019-07-30 – 2019-08-02 (×10): 100 mg via ORAL
  Filled 2019-07-30 (×10): qty 1

## 2019-07-30 MED ORDER — SODIUM CHLORIDE 0.9% FLUSH: 3.0000 mL | INTRAVENOUS | Status: DC | PRN

## 2019-07-30 NOTE — Plan of Care (Signed)
  Problem: Education: Goal: Knowledge of General Education information will improve Description Including pain rating scale, medication(s)/side effects and non-pharmacologic comfort measures Outcome: Progressing   Problem: Health Behavior/Discharge Planning: Goal: Ability to manage health-related needs will improve Outcome: Progressing   

## 2019-07-30 NOTE — Progress Notes (Signed)
PROGRESS NOTE [facility]   Joyce [name]  [id number]  DOB: 06/02/1956  DOA: 07/29/2019 PCP: Lucia Gaskins, MD   Brief Admission Hx: 63 y.o. female with past medical history relevant for HTN, prior stroke with right-sided weakness, chronic pain syndrome with radiculopathy, HLD, CKD 3 who presents to the ED with a 3 to 4-day history of fatigue, occasional headaches, occasional dizzy spells, sore throat, occasional dyspnea without cough, back and abdominal discomfort and myalgias  MDM/Assessment & Plan:   1. AKI on CKD stage iiib -suspect exacerbated by ongoing ACE inhibitor, thiazide diuretic and torsemide use.  Her baseline creatinine had been around 2 and now is continuing to rise.  She may have had some ATN due to hypotension that was present on admission.  Renal ultrasound pending.  Continue IV fluid hydration and monitoring.  Will ask for nephrology consult when available. 2. Anemia of chronic kidney disease-hemoglobin slightly down from hydration, continue to monitor closely. 3. Essential hypertension-slowly add back home blood pressure medications as tolerated. 4. Cerebrovascular disease with history of CVA-she is currently maintained on atorvastatin and Plavix. 5. Acute dyspnea with elevated D-dimer-VQ scan low suspicion for PE.  Repeat chest x-ray in a.m.  Continue supplemental oxygen as needed. 6. Generalized weakness and malaise-likely secondary to AKI, continue to monitor closely and provide supportive care.  DVT prophylaxis: SCD Code Status: Full Family Communication: Patient updated at bedside Disposition Plan: Inpatient   Consultants:    Procedures:    Antimicrobials:     Subjective: Awake, alert, reporting she remains weak but denies shortness of breath and chest pain.  Objective: Vitals:   07/30/19 0800 07/30/19 0830 07/30/19 0845 07/30/19 0900  BP: (!) 97/46 (!) 105/53  (!) 115/54  Pulse: 74     Resp: 19 17 16 18   Temp:      TempSrc:       SpO2: 94%     Weight:      Height:       No intake or output data in the 24 hours ending 07/30/19 1415 Filed Weights   07/29/19 1215  Weight: 115.2 kg     REVIEW OF SYSTEMS  As per history otherwise all reviewed and reported negative  Exam:  General exam: Chronically ill-appearing female sitting in the bed she is awake and alert no apparent distress. Respiratory system: Clear. No increased work of breathing. Cardiovascular system: S1 & S2 heard. No JVD, murmurs, gallops, clicks or pedal edema. Gastrointestinal system: Abdomen is nondistended, soft and nontender. Normal bowel sounds heard. Central nervous system: Alert and oriented. No focal neurological deficits. Extremities: no cyanosis.  Data Reviewed: Basic Metabolic Panel: Recent Labs  Lab 07/29/19 1322 07/30/19 0744  NA 136 137  K 3.4* 3.4*  CL 102 103  CO2 21* 21*  GLUCOSE 121* 122*  BUN 29* 36*  CREATININE 3.80* 5.56*  CALCIUM 8.7* 8.3*   Liver Function Tests: Recent Labs  Lab 07/29/19 1322  AST 17  ALT 11  ALKPHOS 84  BILITOT 0.8  PROT 7.5  ALBUMIN 3.8   Recent Labs  Lab 07/29/19 1322  LIPASE 29   No results for input(s): AMMONIA in the last 168 hours. CBC: Recent Labs  Lab 07/29/19 1322 07/30/19 0744  WBC 10.1 9.8  NEUTROABS 7.7  --   HGB 9.6* 8.4*  HCT 31.0* 27.1*  MCV 81.2 81.9  PLT 433* 325   Cardiac Enzymes: No results for input(s): CKTOTAL, CKMB, CKMBINDEX, TROPONINI in the last 168 hours. CBG (last  3)  No results for input(s): GLUCAP in the last 72 hours. Recent Results (from the past 240 hour(s))  SARS CORONAVIRUS 2 (TAT 6-24 HRS) Nasopharyngeal Nasopharyngeal Swab     Status: None   Collection Time: 07/29/19  2:55 PM   Specimen: Nasopharyngeal Swab  Result Value Ref Range Status   SARS Coronavirus 2 NEGATIVE NEGATIVE Final    Comment: (NOTE) SARS-CoV-2 target nucleic acids are NOT DETECTED. The SARS-CoV-2 RNA is generally detectable in upper and lower respiratory  specimens during the acute phase of infection. Negative results do not preclude SARS-CoV-2 infection, do not rule out co-infections with other pathogens, and should not be used as the sole basis for treatment or other patient management decisions. Negative results must be combined with clinical observations, patient history, and epidemiological information. The expected result is Negative. Fact Sheet for Patients: SugarRoll.be Fact Sheet for Healthcare Providers: https://www.woods-mathews.com/ This test is not yet approved or cleared by the [place] FDA and  has been authorized for detection and/or diagnosis of SARS-CoV-2 by FDA under an Emergency Use Authorization (EUA). This EUA will remain  in effect (meaning this test can be used) for the duration of the COVID-19 declaration under Section 56 4(b)(1) of the Act, 21 U.S.C. section 360bbb-3(b)(1), unless the authorization is terminated or revoked sooner. Performed at [facility], [place] [address], [place], [place] [address]   Respiratory Panel by RT PCR (Flu A&B, Covid) - Nasopharyngeal Swab     Status: None   Collection Time: 07/29/19  3:58 PM   Specimen: Nasopharyngeal Swab  Result Value Ref Range Status   SARS Coronavirus 2 by RT PCR NEGATIVE NEGATIVE Final    Comment: (NOTE) SARS-CoV-2 target nucleic acids are NOT DETECTED. The SARS-CoV-2 RNA is generally detectable in upper respiratoy specimens during the acute phase of infection. The lowest concentration of SARS-CoV-2 viral copies this assay can detect is 131 copies/mL. A negative result does not preclude SARS-Cov-2 infection and should not be used as the sole basis for treatment or other patient management decisions. A negative result may occur with  improper specimen collection/handling, submission of specimen other than nasopharyngeal swab, presence of viral mutation(s) within the areas targeted by this assay, and  inadequate number of viral copies (<131 copies/mL). A negative result must be combined with clinical observations, patient history, and epidemiological information. The expected result is Negative. Fact Sheet for Patients:  PinkCheek.be Fact Sheet for Healthcare Providers:  GravelBags.it This test is not yet ap proved or cleared by the [place] FDA and  has been authorized for detection and/or diagnosis of SARS-CoV-2 by FDA under an Emergency Use Authorization (EUA). This EUA will remain  in effect (meaning this test can be used) for the duration of the COVID-19 declaration under Section 564(b)(1) of the Act, 21 U.S.C. section 360bbb-3(b)(1), unless the authorization is terminated or revoked sooner.    Influenza A by PCR NEGATIVE NEGATIVE Final   Influenza B by PCR NEGATIVE NEGATIVE Final    Comment: (NOTE) The Xpert Xpress SARS-CoV-2/FLU/RSV assay is intended as an aid in  the diagnosis of influenza from Nasopharyngeal swab specimens and  should not be used as a sole basis for treatment. Nasal washings and  aspirates are unacceptable for Xpert Xpress SARS-CoV-2/FLU/RSV  testing. Fact Sheet for Patients: PinkCheek.be Fact Sheet for Healthcare Providers: GravelBags.it This test is not yet approved or cleared by the [place] FDA and  has been authorized for detection and/or diagnosis of SARS-CoV-2 by  FDA under  an Emergency Use Authorization (EUA). This EUA will remain  in effect (meaning this test can be used) for the duration of the  Covid-19 declaration under Section 564(b)(1) of the Act, 21  U.S.C. section 360bbb-3(b)(1), unless the authorization is  terminated or revoked. Performed at [facility], [address], [place], [place] [address]      Studies: NM Pulmonary Perfusion  Result Date: 07/29/2019 CLINICAL DATA:  Shortness of breath and chest  pain. Positive D-dimer. EXAM: NUCLEAR MEDICINE PERFUSION LUNG SCAN TECHNIQUE: Perfusion images were obtained in multiple projections after intravenous injection of radiopharmaceutical. Ventilation scans intentionally deferred if perfusion scan and chest x-ray adequate for interpretation during COVID 19 epidemic. RADIOPHARMACEUTICALS:  1.5 mCi Tc-37m MAA IV COMPARISON:  Chest x-ray dated 07/29/2019 FINDINGS: The perfusion study is normal. IMPRESSION: No evidence of pulmonary embolism.  Normal perfusion to both lungs. Electronically Signed   By: Lorriane Shire M.D.   On: 07/29/2019 18:24   Venous Doppler [place] [facility] ONLY  Result Date: 07/30/2019 CLINICAL DATA:  63 year old female with bilateral lower extremity pain for 1 week EXAM: BILATERAL LOWER EXTREMITY VENOUS DOPPLER ULTRASOUND TECHNIQUE: Gray-scale sonography with graded compression, as well as color Doppler and duplex ultrasound were performed to evaluate the lower extremity deep venous systems from the level of the common femoral vein and including the common femoral, femoral, profunda femoral, popliteal and calf veins including the posterior tibial, peroneal and gastrocnemius veins when visible. The superficial great saphenous vein was also interrogated. Spectral Doppler was utilized to evaluate flow at rest and with distal augmentation maneuvers in the common femoral, femoral and popliteal veins. COMPARISON:  None. FINDINGS: RIGHT LOWER EXTREMITY Common Femoral Vein: No evidence of thrombus. Normal compressibility, respiratory phasicity and response to augmentation. Saphenofemoral Junction: No evidence of thrombus. Normal compressibility and flow on color Doppler imaging. Profunda Femoral Vein: No evidence of thrombus. Normal compressibility and flow on color Doppler imaging. Femoral Vein: No evidence of thrombus. Normal compressibility, respiratory phasicity and response to augmentation. Popliteal Vein: No evidence of thrombus. Normal compressibility,  respiratory phasicity and response to augmentation. Calf Veins: No evidence of thrombus. Normal compressibility and flow on color Doppler imaging. Superficial Great Saphenous Vein: No evidence of thrombus. Normal compressibility. Venous Reflux:  None. Other Findings:  None. LEFT LOWER EXTREMITY Common Femoral Vein: No evidence of thrombus. Normal compressibility, respiratory phasicity and response to augmentation. Saphenofemoral Junction: No evidence of thrombus. Normal compressibility and flow on color Doppler imaging. Profunda Femoral Vein: No evidence of thrombus. Normal compressibility and flow on color Doppler imaging. Femoral Vein: No evidence of thrombus. Normal compressibility, respiratory phasicity and response to augmentation. Popliteal Vein: No evidence of thrombus. Normal compressibility, respiratory phasicity and response to augmentation. Calf Veins: No evidence of thrombus. Normal compressibility and flow on color Doppler imaging. Superficial Great Saphenous Vein: No evidence of thrombus. Normal compressibility. Venous Reflux:  None. Other Findings:  None. IMPRESSION: No evidence of deep venous thrombosis in either lower extremity. Electronically Signed   By: Jacqulynn Cadet M.D.   On: 07/30/2019 08:59   DG Chest Port 1 View  Result Date: 07/29/2019 CLINICAL DATA:  Shortness of breath EXAM: PORTABLE CHEST 1 VIEW COMPARISON:  February 23, 2019 FINDINGS: No edema or consolidation. Heart is upper normal in size with pulmonary vascularity normal. No adenopathy. There is degenerative change in the thoracic spine. IMPRESSION: No edema or consolidation. Electronically Signed   By: Lowella Grip III M.D.   On: 07/29/2019 13:25     Scheduled Meds:  atorvastatin  40 mg Oral QPM   [START ON 08/06/2019] cloNIDine  0.2 mg Transdermal Weekly   clopidogrel  75 mg Oral Q breakfast   ezetimibe  10 mg Oral q morning - 10a   gabapentin  100 mg Oral TID   heparin  5,000 Units Subcutaneous Q8H    hydrALAZINE  25 mg Oral TID   labetalol  100 mg Oral BID   loratadine  10 mg Oral Daily   sodium chloride flush  3 mL Intravenous Q12H   Vitamin D3  5,000 Units Oral Daily   Continuous Infusions:  sodium chloride     sodium chloride 50 mL/hr at 07/30/19 1211    Principal Problem:   AKI (acute kidney injury) on CKD III Active Problems:   Essential hypertension, benign   History of stroke   ARF (acute renal failure) (HCC)   CKD (chronic kidney disease), stage III   Time spent:   Irwin Brakeman, MD [facility] 07/30/2019, 2:15 PM    LOS: 0 days  How to contact the [facility] Attending or Consulting provider [place] or covering provider during after hours [place], for this patient?  1. Check the care team in [facility] and look for a) attending/consulting [facility] provider listed and b) the [facility] team listed 2. Log into [email or web address] and use [place] universal password to access. If you do not have the password, please contact the hospital operator. 3. Locate the [facility] provider you are looking for under [facility] and page to a number that you can be directly reached. 4. If you still have difficulty reaching the provider, please page the [facility] (Director on Call) for the Hospitalists listed on amion for assistance.

## 2019-07-31 ENCOUNTER — Inpatient Hospital Stay (HOSPITAL_COMMUNITY): Payer: Medicare Other

## 2019-07-31 LAB — RENAL FUNCTION PANEL
Albumin: 3 g/dL — ABNORMAL LOW (ref 3.5–5.0)
Anion gap: 12 (ref 5–15)
BUN: 43 mg/dL — ABNORMAL HIGH (ref 8–23)
CO2: 22 mmol/L (ref 22–32)
Calcium: 7.9 mg/dL — ABNORMAL LOW (ref 8.9–10.3)
Chloride: 101 mmol/L (ref 98–111)
Creatinine, Ser: 5.73 mg/dL — ABNORMAL HIGH (ref 0.44–1.00)
GFR calc Af Amer: 8 mL/min — ABNORMAL LOW (ref >60)
GFR calc non Af Amer: 7 mL/min — ABNORMAL LOW (ref >60)
Glucose, Bld: 104 mg/dL — ABNORMAL HIGH (ref 70–99)
Phosphorus: 4.7 mg/dL — ABNORMAL HIGH (ref 2.5–4.6)
Potassium: 3.3 mmol/L — ABNORMAL LOW (ref 3.5–5.1)
Sodium: 135 mmol/L (ref 135–145)

## 2019-07-31 LAB — URINALYSIS, ROUTINE W REFLEX MICROSCOPIC
Bilirubin Urine: NEGATIVE
Glucose, UA: NEGATIVE mg/dL
Hgb urine dipstick: NEGATIVE
Ketones, ur: NEGATIVE mg/dL
Nitrite: NEGATIVE
Protein, ur: 30 mg/dL — AB
Specific Gravity, Urine: 1.011 (ref 1.005–1.030)
WBC, UA: >50 WBC/hpf — ABNORMAL HIGH (ref 0–5)
pH: 5 (ref 5.0–8.0)

## 2019-07-31 LAB — CBC
HCT: 25.3 % — ABNORMAL LOW (ref 36.0–46.0)
Hemoglobin: 7.4 g/dL — ABNORMAL LOW (ref 12.0–15.0)
MCH: 24.5 pg — ABNORMAL LOW (ref 26.0–34.0)
MCHC: 29.2 g/dL — ABNORMAL LOW (ref 30.0–36.0)
MCV: 83.8 fL (ref 80.0–100.0)
Platelets: 299 10*3/uL (ref 150–400)
RBC: 3.02 MIL/uL — ABNORMAL LOW (ref 3.87–5.11)
RDW: 14.6 % (ref 11.5–15.5)
WBC: 8.2 10*3/uL (ref 4.0–10.5)
nRBC: 0 % (ref 0.0–0.2)

## 2019-07-31 LAB — MAGNESIUM: Magnesium: 1.7 mg/dL (ref 1.7–2.4)

## 2019-07-31 MED ORDER — POLYVINYL ALCOHOL 1.4 % OP SOLN
1.0000 [drp] | OPHTHALMIC | Status: DC | PRN
  Administered 2019-07-31: 2 [drp] via OPHTHALMIC
  Filled 2019-07-31: qty 15

## 2019-07-31 NOTE — Progress Notes (Signed)
PROGRESS NOTE Joyce [name]   Joyce [name]  [id number]  DOB: Jan 23, 1956  DOA: 07/29/2019 PCP: Lucia Gaskins, MD   Brief Admission Hx: 63 y.o. female with past medical history relevant for HTN, prior stroke with right-sided weakness, chronic pain syndrome with radiculopathy, HLD, CKD 3 who presents to the ED with a 3 to 4-day history of fatigue, occasional headaches, occasional dizzy spells, sore throat, occasional dyspnea without cough, back and abdominal discomfort and myalgias  MDM/Assessment & Plan:   1. AKI on CKD stage iiib -suspect exacerbated by ongoing ACE inhibitor, thiazide diuretic and torsemide use.  Her baseline creatinine had been around 2 and now is continuing to rise.  She may have had some ATN due to hypotension that was present on admission.  Renal ultrasound with no significant findings.  Continue IV fluid hydration and monitoring.  Will ask for nephrology consult when available. 2. Anemia of chronic kidney disease-hemoglobin down from hydration, continue to monitor closely. 3. Essential hypertension-slowly add back home blood pressure medications as tolerated. 4. Cerebrovascular disease with history of CVA-she is currently maintained on atorvastatin and Plavix. 5. Acute dyspnea with elevated D-dimer-VQ scan low suspicion for PE.  Repeat chest x-ray in a.m.  Continue supplemental oxygen as needed. 6. Generalized weakness and malaise-likely secondary to AKI, continue to monitor closely and provide supportive care.  Requested PT evaluation for ambulation.  Out of bed to chair today.  Ambulate in room as much as possible.  DVT prophylaxis: SCD Code Status: Full Family Communication: Patient updated at bedside Disposition Plan: Inpatient   Consultants:  nephrology  Procedures:    Antimicrobials:     Subjective: Patient continues to complain of pain and wanting to escalate pain medication however reports weakness is starting to improve.  She says  she still not feeling her baseline at this point.  She denies cough and chest congestion and chest pain.  Objective: Vitals:   07/30/19 1903 07/30/19 2020 07/30/19 2203 07/31/19 0535  BP:   (!) 127/50 (!) 125/59  Pulse:   72 66  Resp:   18 18  Temp: (!) 100.4 F (38 C)  98.3 F (36.8 C) 98.1 F (36.7 C)  TempSrc: Oral     SpO2:  92% 98% 99%  Weight:      Height:        Intake/Output Summary (Last 24 hours) at 07/31/2019 1407 Last data filed at 07/31/2019 0300 Gross per 24 hour  Intake 1165.7 ml  Output --  Net 1165.7 ml   Filed Weights   07/29/19 1215 07/30/19 1400  Weight: 115.2 kg 107.8 kg   REVIEW OF SYSTEMS  As per history otherwise all reviewed and reported negative  Exam:  General exam: Chronically ill-appearing female sitting in the bed she is awake and alert no apparent distress. Respiratory system: Clear. No increased work of breathing. Cardiovascular system: S1 & S2 heard. No JVD, murmurs, gallops, clicks or pedal edema. Gastrointestinal system: Abdomen is nondistended, soft and nontender. Normal bowel sounds heard. Central nervous system: Alert and oriented. No focal neurological deficits. Extremities: no cyanosis.  Data Reviewed: Basic Metabolic Panel: Recent Labs  Lab 07/29/19 1322 07/30/19 0744 07/31/19 0618  NA 136 137 135  K 3.4* 3.4* 3.3*  CL 102 103 101  CO2 21* 21* 22  GLUCOSE 121* 122* 104*  BUN 29* 36* 43*  CREATININE 3.80* 5.56* 5.73*  CALCIUM 8.7* 8.3* 7.9*  MG  --   --  1.7  PHOS  --   --  4.7*   Liver Function Tests: Recent Labs  Lab 07/29/19 1322 07/31/19 0618  AST 17  --   ALT 11  --   ALKPHOS 84  --   BILITOT 0.8  --   PROT 7.5  --   ALBUMIN 3.8 3.0*   Recent Labs  Lab 07/29/19 1322  LIPASE 29   No results for input(s): AMMONIA in the last 168 hours. CBC: Recent Labs  Lab 07/29/19 1322 07/30/19 0744 07/31/19 0618  WBC 10.1 9.8 8.2  NEUTROABS 7.7  --   --   HGB 9.6* 8.4* 7.4*  HCT 31.0* 27.1* 25.3*  MCV  81.2 81.9 83.8  PLT 433* 325 299   Cardiac Enzymes: No results for input(s): CKTOTAL, CKMB, CKMBINDEX, TROPONINI in the last 168 hours. CBG (last 3)  No results for input(s): GLUCAP in the last 72 hours. Recent Results (from the past 240 hour(s))  SARS CORONAVIRUS 2 (TAT 6-24 HRS) Nasopharyngeal Nasopharyngeal Swab     Status: None   Collection Time: 07/29/19  2:55 PM   Specimen: Nasopharyngeal Swab  Result Value Ref Range Status   SARS Coronavirus 2 NEGATIVE NEGATIVE Final    Comment: (NOTE) SARS-CoV-2 target nucleic acids are NOT DETECTED. The SARS-CoV-2 RNA is generally detectable in upper and lower respiratory specimens during the acute phase of infection. Negative results do not preclude SARS-CoV-2 infection, do not rule out co-infections with other pathogens, and should not be used as the sole basis for treatment or other patient management decisions. Negative results must be combined with clinical observations, patient history, and epidemiological information. The expected result is Negative. Fact Sheet for Patients: SugarRoll.be Fact Sheet for Healthcare Providers: https://www.woods-mathews.com/ This test is not yet approved or cleared by the [place] FDA and  has been authorized for detection and/or diagnosis of SARS-CoV-2 by FDA under an Emergency Use Authorization (EUA). This EUA will remain  in effect (meaning this test can be used) for the duration of the COVID-19 declaration under Section 56 4(b)(1) of the Act, 21 U.S.C. section 360bbb-3(b)(1), unless the authorization is terminated or revoked sooner. Performed at [facility], [place] [address] [place], [place] [address]   Respiratory Panel by RT PCR (Flu A&B, Covid) - Nasopharyngeal Swab     Status: None   Collection Time: 07/29/19  3:58 PM   Specimen: Nasopharyngeal Swab  Result Value Ref Range Status   SARS Coronavirus 2 by RT PCR NEGATIVE NEGATIVE Final     Comment: (NOTE) SARS-CoV-2 target nucleic acids are NOT DETECTED. The SARS-CoV-2 RNA is generally detectable in upper respiratoy specimens during the acute phase of infection. The lowest concentration of SARS-CoV-2 viral copies this assay can detect is 131 copies/mL. A negative result does not preclude SARS-Cov-2 infection and should not be used as the sole basis for treatment or other patient management decisions. A negative result may occur with  improper specimen collection/handling, submission of specimen other than nasopharyngeal swab, presence of viral mutation(s) within the areas targeted by this assay, and inadequate number of viral copies (<131 copies/mL). A negative result must be combined with clinical observations, patient history, and epidemiological information. The expected result is Negative. Fact Sheet for Patients:  PinkCheek.be Fact Sheet for Healthcare Providers:  GravelBags.it This test is not yet ap proved or cleared by the [place] FDA and  has been authorized for detection and/or diagnosis of SARS-CoV-2 by FDA under an Emergency Use Authorization (EUA). This EUA will remain  in effect (meaning this test can  be used) for the duration of the COVID-19 declaration under Section 564(b)(1) of the Act, 21 U.S.C. section 360bbb-3(b)(1), unless the authorization is terminated or revoked sooner.    Influenza A by PCR NEGATIVE NEGATIVE Final   Influenza B by PCR NEGATIVE NEGATIVE Final    Comment: (NOTE) The Xpert Xpress SARS-CoV-2/FLU/RSV assay is intended as an aid in  the diagnosis of influenza from Nasopharyngeal swab specimens and  should not be used as a sole basis for treatment. Nasal washings and  aspirates are unacceptable for Xpert Xpress SARS-CoV-2/FLU/RSV  testing. Fact Sheet for Patients: PinkCheek.be Fact Sheet for Healthcare  Providers: GravelBags.it This test is not yet approved or cleared by the [place] FDA and  has been authorized for detection and/or diagnosis of SARS-CoV-2 by  FDA under an Emergency Use Authorization (EUA). This EUA will remain  in effect (meaning this test can be used) for the duration of the  Covid-19 declaration under Section 564(b)(1) of the Act, 21  U.S.C. section 360bbb-3(b)(1), unless the authorization is  terminated or revoked. Performed at [facility], [address] [place], [place] [address]      Studies: NM Pulmonary Perfusion  Result Date: 07/29/2019 CLINICAL DATA:  Shortness of breath and chest pain. Positive D-dimer. EXAM: NUCLEAR MEDICINE PERFUSION LUNG SCAN TECHNIQUE: Perfusion images were obtained in multiple projections after intravenous injection of radiopharmaceutical. Ventilation scans intentionally deferred if perfusion scan and chest x-ray adequate for interpretation during COVID 19 epidemic. RADIOPHARMACEUTICALS:  1.5 mCi Tc-53m MAA IV COMPARISON:  Chest x-ray dated 07/29/2019 FINDINGS: The perfusion study is normal. IMPRESSION: No evidence of pulmonary embolism.  Normal perfusion to both lungs. Electronically Signed   By: Lorriane Shire M.D.   On: 07/29/2019 18:24   US Renal  Result Date: 07/30/2019 CLINICAL DATA:  63 year old presenting with acute kidney injury. EXAM: RENAL / URINARY TRACT ULTRASOUND COMPLETE COMPARISON:  02/09/2019. FINDINGS: Right Kidney: Renal measurements: Approximately 9.4 x 4.9 x 5.6 cm = volume: 139 mL. No hydronephrosis. Well-preserved cortex. No shadowing calculi. Normal parenchymal echotexture. No focal parenchymal abnormality. Left Kidney: Renal measurements: Approximately 9.6 x 5.0 x 4.6 cm = volume: 118 mL. No hydronephrosis. Well-preserved cortex. No shadowing calculi. Normal parenchymal echotexture. No focal parenchymal abnormality. Bladder: Decompressed and normal in appearance with a volume of  approximately 104 mL. Other: None. IMPRESSION: Normal examination. Electronically Signed   By: Evangeline Dakin M.D.   On: 07/30/2019 15:52   Venous Doppler [place] [facility] ONLY  Result Date: 07/30/2019 CLINICAL DATA:  63 year old female with bilateral lower extremity pain for 1 week EXAM: BILATERAL LOWER EXTREMITY VENOUS DOPPLER ULTRASOUND TECHNIQUE: Gray-scale sonography with graded compression, as well as color Doppler and duplex ultrasound were performed to evaluate the lower extremity deep venous systems from the level of the common femoral vein and including the common femoral, femoral, profunda femoral, popliteal and calf veins including the posterior tibial, peroneal and gastrocnemius veins when visible. The superficial great saphenous vein was also interrogated. Spectral Doppler was utilized to evaluate flow at rest and with distal augmentation maneuvers in the common femoral, femoral and popliteal veins. COMPARISON:  None. FINDINGS: RIGHT LOWER EXTREMITY Common Femoral Vein: No evidence of thrombus. Normal compressibility, respiratory phasicity and response to augmentation. Saphenofemoral Junction: No evidence of thrombus. Normal compressibility and flow on color Doppler imaging. Profunda Femoral Vein: No evidence of thrombus. Normal compressibility and flow on color Doppler imaging. Femoral Vein: No evidence of thrombus. Normal compressibility, respiratory phasicity and response to augmentation. Popliteal Vein: No evidence  of thrombus. Normal compressibility, respiratory phasicity and response to augmentation. Calf Veins: No evidence of thrombus. Normal compressibility and flow on color Doppler imaging. Superficial Great Saphenous Vein: No evidence of thrombus. Normal compressibility. Venous Reflux:  None. Other Findings:  None. LEFT LOWER EXTREMITY Common Femoral Vein: No evidence of thrombus. Normal compressibility, respiratory phasicity and response to augmentation. Saphenofemoral Junction: No evidence  of thrombus. Normal compressibility and flow on color Doppler imaging. Profunda Femoral Vein: No evidence of thrombus. Normal compressibility and flow on color Doppler imaging. Femoral Vein: No evidence of thrombus. Normal compressibility, respiratory phasicity and response to augmentation. Popliteal Vein: No evidence of thrombus. Normal compressibility, respiratory phasicity and response to augmentation. Calf Veins: No evidence of thrombus. Normal compressibility and flow on color Doppler imaging. Superficial Great Saphenous Vein: No evidence of thrombus. Normal compressibility. Venous Reflux:  None. Other Findings:  None. IMPRESSION: No evidence of deep venous thrombosis in either lower extremity. Electronically Signed   By: Jacqulynn Cadet M.D.   On: 07/30/2019 08:59   DG CHEST PORT 1 VIEW  Result Date: 07/31/2019 CLINICAL DATA:  Headaches and fatigue EXAM: PORTABLE CHEST 1 VIEW COMPARISON:  07/29/2019 FINDINGS: The heart size and mediastinal contours are within normal limits. Both lungs are clear. The visualized skeletal structures are unremarkable. IMPRESSION: No active disease. Electronically Signed   By: Ulyses Jarred M.D.   On: 07/31/2019 05:10   Scheduled Meds: . atorvastatin  40 mg Oral QPM  . [START ON 08/06/2019] cloNIDine  0.2 mg Transdermal Weekly  . clopidogrel  75 mg Oral Q breakfast  . ezetimibe  10 mg Oral q morning - 10a  . gabapentin  100 mg Oral TID  . heparin  5,000 Units Subcutaneous Q8H  . hydrALAZINE  25 mg Oral TID  . labetalol  100 mg Oral BID  . loratadine  10 mg Oral Daily  . sodium chloride flush  3 mL Intravenous Q12H  . Vitamin D3  5,000 Units Oral Daily   Continuous Infusions: . sodium chloride    . sodium chloride 50 mL/hr at 07/31/19 1033    Principal Problem:   AKI (acute kidney injury) on CKD III Active Problems:   Essential hypertension, benign   History of stroke   ARF (acute renal failure) (HCC)   CKD (chronic kidney disease), stage III   Time  spent:   Irwin Brakeman, MD [facility] 07/31/2019, 2:07 PM    LOS: 1 day  How to contact the [facility] Attending or Consulting provider [place] or covering provider during after hours [place], for this patient?  1. Check the care team in [facility] and look for a) attending/consulting [facility] provider listed and b) the [facility] team listed 2. Log into [email or web address] and use [place] universal password to access. If you do not have the password, please contact the hospital operator. 3. Locate the [facility] provider you are looking for under [facility] and page to a number that you can be directly reached. 4. If you still have difficulty reaching the provider, please page the [facility] (Director on Call) for the Hospitalists listed on amion for assistance.

## 2019-08-01 DIAGNOSIS — N390 Urinary tract infection, site not specified: Secondary | ICD-10-CM | POA: Diagnosis present

## 2019-08-01 LAB — ABO/RH: ABO/RH(D): O POS

## 2019-08-01 LAB — RENAL FUNCTION PANEL
Albumin: 2.8 g/dL — ABNORMAL LOW (ref 3.5–5.0)
Anion gap: 10 (ref 5–15)
BUN: 37 mg/dL — ABNORMAL HIGH (ref 8–23)
CO2: 20 mmol/L — ABNORMAL LOW (ref 22–32)
Calcium: 8.3 mg/dL — ABNORMAL LOW (ref 8.9–10.3)
Chloride: 108 mmol/L (ref 98–111)
Creatinine, Ser: 3.04 mg/dL — ABNORMAL HIGH (ref 0.44–1.00)
GFR calc Af Amer: 18 mL/min — ABNORMAL LOW (ref >60)
GFR calc non Af Amer: 16 mL/min — ABNORMAL LOW (ref >60)
Glucose, Bld: 101 mg/dL — ABNORMAL HIGH (ref 70–99)
Phosphorus: 3.3 mg/dL (ref 2.5–4.6)
Potassium: 3.7 mmol/L (ref 3.5–5.1)
Sodium: 138 mmol/L (ref 135–145)

## 2019-08-01 LAB — CBC
HCT: 22.6 % — ABNORMAL LOW (ref 36.0–46.0)
Hemoglobin: 7 g/dL — ABNORMAL LOW (ref 12.0–15.0)
MCH: 25.4 pg — ABNORMAL LOW (ref 26.0–34.0)
MCHC: 31 g/dL (ref 30.0–36.0)
MCV: 81.9 fL (ref 80.0–100.0)
Platelets: 297 10*3/uL (ref 150–400)
RBC: 2.76 MIL/uL — ABNORMAL LOW (ref 3.87–5.11)
RDW: 14.6 % (ref 11.5–15.5)
WBC: 6.4 10*3/uL (ref 4.0–10.5)
nRBC: 0 % (ref 0.0–0.2)

## 2019-08-01 LAB — MAGNESIUM: Magnesium: 1.8 mg/dL (ref 1.7–2.4)

## 2019-08-01 LAB — PREPARE RBC (CROSSMATCH)

## 2019-08-01 MED ORDER — SODIUM CHLORIDE 0.9 % IV SOLN
1.0000 g | INTRAVENOUS | Status: DC
  Administered 2019-08-01 – 2019-08-02 (×2): 1 g via INTRAVENOUS
  Filled 2019-08-01 (×2): qty 10

## 2019-08-01 MED ORDER — ACETAMINOPHEN 325 MG PO TABS
650.0000 mg | ORAL_TABLET | Freq: Once | ORAL | Status: AC
  Administered 2019-08-01: 650 mg via ORAL
  Filled 2019-08-01: qty 2

## 2019-08-01 MED ORDER — SODIUM CHLORIDE 0.9% IV SOLUTION: Freq: Once | INTRAVENOUS | Status: AC

## 2019-08-01 MED ORDER — SENNOSIDES-DOCUSATE SODIUM 8.6-50 MG PO TABS
2.0000 | ORAL_TABLET | Freq: Two times a day (BID) | ORAL | Status: DC
  Administered 2019-08-01: 2 via ORAL
  Filled 2019-08-01 (×3): qty 2

## 2019-08-01 MED ORDER — AMLODIPINE BESYLATE 5 MG PO TABS
10.0000 mg | ORAL_TABLET | Freq: Every day | ORAL | Status: DC
  Administered 2019-08-01 – 2019-08-02 (×2): 10 mg via ORAL
  Filled 2019-08-01 (×2): qty 2

## 2019-08-01 NOTE — Progress Notes (Signed)
PROGRESS NOTE [facility]   CHARLEEN SALSBERRY  [id number]  DOB: 03-30-1956  DOA: 07/29/2019 PCP: Lucia Gaskins, MD   Brief Admission Hx: 63 y.o. female with past medical history relevant for HTN, prior stroke with right-sided weakness, chronic pain syndrome with radiculopathy, HLD, CKD 3 who presents to the ED with a 3 to 4-day history of fatigue, occasional headaches, occasional dizzy spells, sore throat, occasional dyspnea without cough, back and abdominal discomfort and myalgias  MDM/Assessment & Plan:   1. AKI on CKD stage iiib -suspect exacerbated by ongoing ACE inhibitor, thiazide diuretic and torsemide use.  Her baseline creatinine had been around 2 and peaked at 5.6, now down to 3.  She likely had some ATN due to hypotension that was present on admission.  Renal ultrasound with no significant findings.  Reduced IV fluids with improved creatinine.  Will ask for nephrology consult when available. 2. Anemia of chronic kidney disease-hemoglobin down with hydration, no blood in stool but with Hg down to 7 will transfuse 1 unit PRBC due to CKD with goal of Hg 8.   3. UTI - Pt reports burning with urination and discharge after urinating.  Ceftriaxone 1 gm IV ordered.  4. Essential hypertension-slowly adding back home blood pressure medications as tolerated with goal to avoid hypotension.  5. Cerebrovascular disease with history of CVA-she is currently maintained on atorvastatin and Plavix. 6. Acute dyspnea with elevated D-dimer-VQ scan low suspicion for PE.  Repeat chest x-ray in a.m.  Continue supplemental oxygen as needed. 7. Generalized weakness and malaise-likely secondary to AKI, continue to monitor closely and provide supportive care.  Requested PT evaluation for ambulation.  Out of bed to chair.  Ambulate in room as much as possible.  DVT prophylaxis: SCD Code Status: Full Family Communication: Patient updated at bedside Disposition Plan: Inpatient   Consultants:   nephrology  Procedures:    Antimicrobials:     Subjective: Patient reports burning with urination.   Objective: Vitals:   07/31/19 2151 07/31/19 2154 08/01/19 0524 08/01/19 0600  BP:  (!) 142/51 (!) 145/48   Pulse:  73 68   Resp:  18 18   Temp:  100 F (37.8 C) 99.4 F (37.4 C)   TempSrc:  Oral Oral   SpO2: 97% 100% 100%   Weight:    110.3 kg  Height:       No intake or output data in the 24 hours ending 08/01/19 1216 Filed Weights   07/29/19 1215 07/30/19 1400 08/01/19 0600  Weight: 115.2 kg 107.8 kg 110.3 kg   REVIEW OF SYSTEMS  As per history otherwise all reviewed and reported negative  Exam:  General exam: Chronically ill-appearing female sitting in the bed she is awake and alert no apparent distress. Respiratory system: Clear. No increased work of breathing. Cardiovascular system: S1 & S2 heard. No JVD, murmurs, gallops, clicks or pedal edema. Gastrointestinal system: Abdomen is nondistended, soft and nontender. Normal bowel sounds heard. Central nervous system: Alert and oriented. No focal neurological deficits. Extremities: no cyanosis.  Data Reviewed: Basic Metabolic Panel: Recent Labs  Lab 07/29/19 1322 07/30/19 0744 07/31/19 0618 08/01/19 0622  NA 136 137 135 138  K 3.4* 3.4* 3.3* 3.7  CL 102 103 101 108  CO2 21* 21* 22 20*  GLUCOSE 121* 122* 104* 101*  BUN 29* 36* 43* 37*  CREATININE 3.80* 5.56* 5.73* 3.04*  CALCIUM 8.7* 8.3* 7.9* 8.3*  MG  --   --  1.7  --  PHOS  --   --  4.7* 3.3   Liver Function Tests: Recent Labs  Lab 07/29/19 1322 07/31/19 0618 08/01/19 0622  AST 17  --   --   ALT 11  --   --   ALKPHOS 84  --   --   BILITOT 0.8  --   --   PROT 7.5  --   --   ALBUMIN 3.8 3.0* 2.8*   Recent Labs  Lab 07/29/19 1322  LIPASE 29   No results for input(s): AMMONIA in the last 168 hours. CBC: Recent Labs  Lab 07/29/19 1322 07/30/19 0744 07/31/19 0618 08/01/19 0622  WBC 10.1 9.8 8.2 6.4  NEUTROABS 7.7  --   --   --    HGB 9.6* 8.4* 7.4* 7.0*  HCT 31.0* 27.1* 25.3* 22.6*  MCV 81.2 81.9 83.8 81.9  PLT 433* 325 299 297   Cardiac Enzymes: No results for input(s): CKTOTAL, CKMB, CKMBINDEX, TROPONINI in the last 168 hours. CBG (last 3)  No results for input(s): GLUCAP in the last 72 hours. Recent Results (from the past 240 hour(s))  SARS CORONAVIRUS 2 (TAT 6-24 HRS) Nasopharyngeal Nasopharyngeal Swab     Status: None   Collection Time: 07/29/19  2:55 PM   Specimen: Nasopharyngeal Swab  Result Value Ref Range Status   SARS Coronavirus 2 NEGATIVE NEGATIVE Final    Comment: (NOTE) SARS-CoV-2 target nucleic acids are NOT DETECTED. The SARS-CoV-2 RNA is generally detectable in upper and lower respiratory specimens during the acute phase of infection. Negative results do not preclude SARS-CoV-2 infection, do not rule out co-infections with other pathogens, and should not be used as the sole basis for treatment or other patient management decisions. Negative results must be combined with clinical observations, patient history, and epidemiological information. The expected result is Negative. Fact Sheet for Patients: SugarRoll.be Fact Sheet for Healthcare Providers: https://www.woods-mathews.com/ This test is not yet approved or cleared by the [place] FDA and  has been authorized for detection and/or diagnosis of SARS-CoV-2 by FDA under an Emergency Use Authorization (EUA). This EUA will remain  in effect (meaning this test can be used) for the duration of the COVID-19 declaration under Section 56 4(b)(1) of the Act, 21 U.S.C. section 360bbb-3(b)(1), unless the authorization is terminated or revoked sooner. Performed at [facility], [place] [address], [place], [place] [address]   Respiratory Panel by RT PCR (Flu A&B, Covid) - Nasopharyngeal Swab     Status: None   Collection Time: 07/29/19  3:58 PM   Specimen: Nasopharyngeal Swab  Result Value Ref  Range Status   SARS Coronavirus 2 by RT PCR NEGATIVE NEGATIVE Final    Comment: (NOTE) SARS-CoV-2 target nucleic acids are NOT DETECTED. The SARS-CoV-2 RNA is generally detectable in upper respiratoy specimens during the acute phase of infection. The lowest concentration of SARS-CoV-2 viral copies this assay can detect is 131 copies/mL. A negative result does not preclude SARS-Cov-2 infection and should not be used as the sole basis for treatment or other patient management decisions. A negative result may occur with  improper specimen collection/handling, submission of specimen other than nasopharyngeal swab, presence of viral mutation(s) within the areas targeted by this assay, and inadequate number of viral copies (<131 copies/mL). A negative result must be combined with clinical observations, patient history, and epidemiological information. The expected result is Negative. Fact Sheet for Patients:  PinkCheek.be Fact Sheet for Healthcare Providers:  GravelBags.it This test is not yet ap proved or  cleared by the [place] and  has been authorized for detection and/or diagnosis of SARS-CoV-2 by FDA under an Emergency Use Authorization (EUA). This EUA will remain  in effect (meaning this test can be used) for the duration of the COVID-19 declaration under Section 564(b)(1) of the Act, 21 U.S.C. section 360bbb-3(b)(1), unless the authorization is terminated or revoked sooner.    Influenza A by PCR NEGATIVE NEGATIVE Final   Influenza B by PCR NEGATIVE NEGATIVE Final    Comment: (NOTE) The Xpert Xpress SARS-CoV-2/FLU/RSV assay is intended as an aid in  the diagnosis of influenza from Nasopharyngeal swab specimens and  should not be used as a sole basis for treatment. Nasal washings and  aspirates are unacceptable for Xpert Xpress SARS-CoV-2/FLU/RSV  testing. Fact Sheet for Patients:  PinkCheek.be Fact Sheet for Healthcare Providers: GravelBags.it This test is not yet approved or cleared by the [place] FDA and  has been authorized for detection and/or diagnosis of SARS-CoV-2 by  FDA under an Emergency Use Authorization (EUA). This EUA will remain  in effect (meaning this test can be used) for the duration of the  Covid-19 declaration under Section 564(b)(1) of the Act, 21  U.S.C. section 360bbb-3(b)(1), unless the authorization is  terminated or revoked. Performed at [facility], [address], [place], [place] [address]      Studies: US Renal  Result Date: 07/30/2019 CLINICAL DATA:  63 year old presenting with acute kidney injury. EXAM: RENAL / URINARY TRACT ULTRASOUND COMPLETE COMPARISON:  02/09/2019. FINDINGS: Right Kidney: Renal measurements: Approximately 9.4 x 4.9 x 5.6 cm = volume: 139 mL. No hydronephrosis. Well-preserved cortex. No shadowing calculi. Normal parenchymal echotexture. No focal parenchymal abnormality. Left Kidney: Renal measurements: Approximately 9.6 x 5.0 x 4.6 cm = volume: 118 mL. No hydronephrosis. Well-preserved cortex. No shadowing calculi. Normal parenchymal echotexture. No focal parenchymal abnormality. Bladder: Decompressed and normal in appearance with a volume of approximately 104 mL. Other: None. IMPRESSION: Normal examination. Electronically Signed   By: Evangeline Dakin M.D.   On: 07/30/2019 15:52   DG CHEST PORT 1 VIEW  Result Date: 07/31/2019 CLINICAL DATA:  Headaches and fatigue EXAM: PORTABLE CHEST 1 VIEW COMPARISON:  07/29/2019 FINDINGS: The heart size and mediastinal contours are within normal limits. Both lungs are clear. The visualized skeletal structures are unremarkable. IMPRESSION: No active disease. Electronically Signed   By: Ulyses Jarred M.D.   On: 07/31/2019 05:10   Scheduled Meds: . sodium chloride   Intravenous Once  . acetaminophen  650 mg Oral Once   . atorvastatin  40 mg Oral QPM  . [START ON 08/06/2019] cloNIDine  0.2 mg Transdermal Weekly  . clopidogrel  75 mg Oral Q breakfast  . ezetimibe  10 mg Oral q morning - 10a  . gabapentin  100 mg Oral TID  . heparin  5,000 Units Subcutaneous Q8H  . hydrALAZINE  25 mg Oral TID  . labetalol  100 mg Oral BID  . loratadine  10 mg Oral Daily  . senna-docusate  2 tablet Oral BID  . sodium chloride flush  3 mL Intravenous Q12H  . Vitamin D3  5,000 Units Oral Daily   Continuous Infusions: . sodium chloride    . sodium chloride 35 mL/hr at 07/31/19 1501  . cefTRIAXone (ROCEPHIN)  IV 1 g (08/01/19 0909)    Principal Problem:   AKI (acute kidney injury) on CKD III Active Problems:   Essential hypertension, benign   History of stroke   ARF (acute renal failure) (  [place])   CKD (chronic kidney disease), stage III   UTI (urinary tract infection)   Time spent:   Irwin Brakeman, MD [facility] 08/01/2019, 12:16 PM    LOS: 2 days  How to contact the [facility] Attending or Consulting provider [place] or covering provider during after hours [place], for this patient?  1. Check the care team in [facility] and look for a) attending/consulting [facility] provider listed and b) the [facility] team listed 2. Log into [email or web address] and use [place]'s universal password to access. If you do not have the password, please contact the hospital operator. 3. Locate the [facility] provider you are looking for under [facility] and page to a number that you can be directly reached. 4. If you still have difficulty reaching the provider, please page the [facility] (Director on Call) for the Hospitalists listed on amion for assistance.

## 2019-08-02 ENCOUNTER — Encounter (HOSPITAL_COMMUNITY): Payer: Self-pay | Admitting: Family Medicine

## 2019-08-02 DIAGNOSIS — N390 Urinary tract infection, site not specified: Secondary | ICD-10-CM

## 2019-08-02 LAB — TYPE AND SCREEN
ABO/RH(D): O POS
Antibody Screen: NEGATIVE
Unit division: 0

## 2019-08-02 LAB — CBC
HCT: 29 % — ABNORMAL LOW (ref 36.0–46.0)
Hemoglobin: 8.8 g/dL — ABNORMAL LOW (ref 12.0–15.0)
MCH: 25.1 pg — ABNORMAL LOW (ref 26.0–34.0)
MCHC: 30.3 g/dL (ref 30.0–36.0)
MCV: 82.6 fL (ref 80.0–100.0)
Platelets: 308 10*3/uL (ref 150–400)
RBC: 3.51 MIL/uL — ABNORMAL LOW (ref 3.87–5.11)
RDW: 14.4 % (ref 11.5–15.5)
WBC: 7.1 10*3/uL (ref 4.0–10.5)
nRBC: 0 % (ref 0.0–0.2)

## 2019-08-02 LAB — RENAL FUNCTION PANEL
Albumin: 2.9 g/dL — ABNORMAL LOW (ref 3.5–5.0)
Anion gap: 13 (ref 5–15)
BUN: 31 mg/dL — ABNORMAL HIGH (ref 8–23)
CO2: 20 mmol/L — ABNORMAL LOW (ref 22–32)
Calcium: 8.7 mg/dL — ABNORMAL LOW (ref 8.9–10.3)
Chloride: 108 mmol/L (ref 98–111)
Creatinine, Ser: 2 mg/dL — ABNORMAL HIGH (ref 0.44–1.00)
GFR calc Af Amer: 30 mL/min — ABNORMAL LOW (ref >60)
GFR calc non Af Amer: 26 mL/min — ABNORMAL LOW (ref >60)
Glucose, Bld: 108 mg/dL — ABNORMAL HIGH (ref 70–99)
Phosphorus: 3.1 mg/dL (ref 2.5–4.6)
Potassium: 4 mmol/L (ref 3.5–5.1)
Sodium: 141 mmol/L (ref 135–145)

## 2019-08-02 LAB — BPAM RBC
Blood Product Expiration Date: 202101292359
ISSUE DATE / TIME: 202012271743
Unit Type and Rh: 5100

## 2019-08-02 LAB — HEMOGLOBIN AND HEMATOCRIT, BLOOD
HCT: 28.3 % — ABNORMAL LOW (ref 36.0–46.0)
Hemoglobin: 8.9 g/dL — ABNORMAL LOW (ref 12.0–15.0)

## 2019-08-02 LAB — MAGNESIUM: Magnesium: 1.7 mg/dL (ref 1.7–2.4)

## 2019-08-02 MED ORDER — GABAPENTIN 300 MG PO CAPS: 300.0000 mg | ORAL_CAPSULE | ORAL | Status: DC

## 2019-08-02 MED ORDER — TORSEMIDE 20 MG PO TABS: 20.0000 mg | ORAL_TABLET | Freq: Every day | ORAL | Status: DC

## 2019-08-02 MED ORDER — CEFDINIR 300 MG PO CAPS: 300.0000 mg | ORAL_CAPSULE | Freq: Two times a day (BID) | ORAL | 0 refills | Status: AC

## 2019-08-02 NOTE — Discharge Summary (Signed)
Physician Discharge Summary  Joyce [name] [id number] DOB: 12-17-1955 DOA: 07/29/2019  PCP: Lucia Gaskins, MD Nephrology: Dr. Theador Hawthorne  Admit date: 07/29/2019 Discharge date: 08/02/2019  Admitted From: Home Disposition: Home  Recommendations for Outpatient Follow-up:  1. Follow up with PCP in 1 weeks 2. Follow-up with nephrology in 1 to 2 weeks 3. Please obtain BMP in 1-2 weeks to follow-up renal function to have blood pressure rechecked 4. Please follow-up final urine culture results for UTI.  Discharge Condition: Stable CODE STATUS: Full  Brief Hospitalization Summary: Please see all hospital notes, images, labs for full details of the hospitalization.  Joyce [name]  is a 63 y.o. female with past medical history relevant for HTN, prior stroke with right-sided weakness, chronic pain syndrome with radiculopathy, HLD, CKD 3 who presents to the ED with a 3 to 4-day history of fatigue, occasional headaches, occasional dizzy spells, sore throat, occasional dyspnea without cough, back and abdominal discomfort and myalgias--- Patient had colonoscopy few weeks ago with benign polyps removed at the time, she also had EGD with hiatal hernia finding but no acute findings otherwise  No fever  Or chills,  No Nausea, Vomiting or Diarrhea -No frank chest pains, no leg pains or pleuritic symptoms  In ED--D-dimer was found to be elevated (2.92) COVID-19 test was negative, VQ scan low probability for PE -Chest x-ray without acute findings, BNP is 48 -However patient was found to have a creatinine of 3.8 up from his usual baseline around 2-lipase is not elevated -LFTs are not elevated -Potassium is low at 3.4,  -Hemoglobin is 9.6 which is around patient's baseline  -EDP requested admission due to worsening renal function  Brief Admission Hx: 63 y.o.femalewith past medical history relevant for HTN, prior stroke with right-sided weakness, chronic pain syndrome with radiculopathy, HLD, CKD 3  who presents to the ED with a 3 to 4-day history of fatigue, occasional headaches, occasional dizzy spells, sore throat, occasional dyspnea without cough, back and abdominal discomfort and myalgias  MDM/Assessment & Plan:   1. AKI on CKD stage iiib -suspect exacerbated by ongoing ACE inhibitor, thiazide diuretic and torsemide use.  Her baseline creatinine had been around 2 and peaked at 5.6, now down to 2 after diuretic holiday.  She likely had some ATN due to hypotension that was present on admission.  Renal ultrasound with no significant findings.  She was given IV fluids.    Outpatient follow-up with her nephrologist Dr. Theador Hawthorne in 1 to 2 weeks to have her renal function return tested and blood pressure rechecked. 2. Anemia of chronic kidney disease-hemoglobin down with hydration, no blood in stool but with Hg down to 7 will transfuse 1 unit PRBC due to CKD with goal of Hg 8.    Her hemoglobin is now 8.8. 3. UTI - Pt reports burning with urination and discharge after urinating.  Ceftriaxone 1 gm IV ordered.  And give 2 more days of oral cefuroxime. 4. Essential hypertension-resuming all of her home blood pressure medications with the exception of Zestoretic which has been discontinued.  5. Cerebrovascular disease with history of CVA-she is currently maintained on atorvastatin and Plavix. 6. Acute dyspnea with elevated D-dimer-VQ scan low suspicion for PE.    Her symptoms have resolved.  Chest x-ray did not show any acute findings. 7. Generalized weakness and malaise-likely secondary to AKI, continue to monitor closely and provide supportive care.    She is feeling a lot better now that the AKI has resolved.  DVT prophylaxis: SCD  Code Status: Full Family Communication: Patient updated at bedside Disposition Plan: Inpatient   Discharge Diagnoses:  Principal Problem:   AKI (acute kidney injury) on CKD III Active Problems:   Essential hypertension, benign   History of stroke   ARF (acute  renal failure) (HCC)   CKD (chronic kidney disease), stage III   UTI (urinary tract infection)   Discharge Instructions:  Allergies as of 08/02/2019      Reactions   Lactose Intolerance (gi) Nausea And Vomiting   Aspirin Hives   Other Hives   Ivory soap      Medication List    STOP taking these medications   lisinopril-hydrochlorothiazide 20-12.5 MG tablet Commonly known as: ZESTORETIC   oxyCODONE-acetaminophen 10-325 MG tablet Commonly known as: PERCOCET     TAKE these medications   amLODipine 10 MG tablet Commonly known as: NORVASC Take 10 mg by mouth daily.   atorvastatin 10 MG tablet Commonly known as: LIPITOR Take 10 mg by mouth every evening.   cefdinir 300 MG capsule Commonly known as: OMNICEF Take 1 capsule (300 mg total) by mouth 2 (two) times daily for 2 days. Start taking on: August 03, 2019   cetirizine 10 MG tablet Commonly known as: ZYRTEC Take 10 mg by mouth daily.   cloNIDine 0.2 mg/24hr patch Commonly known as: CATAPRES - Dosed in mg/24 hr Place 1 patch (0.2 mg total) onto the skin once a week. What changed: when to take this   clopidogrel 75 MG tablet Commonly known as: PLAVIX Take 1 tablet (75 mg total) by mouth daily with breakfast.   Dialyvite Vitamin D 5000 125 MCG (5000 UT) capsule Generic drug: Cholecalciferol Take 5,000 Units by mouth daily.   diclofenac Sodium 1 % Gel Commonly known as: VOLTAREN Apply 1 application topically 4 (four) times daily as needed (pain).   EYE DROPS IRRITATION RELIEF OP Apply 1 drop to eye 2 (two) times daily as needed (allergies/irritation).   ezetimibe 10 MG tablet Commonly known as: ZETIA Take 10 mg by mouth every morning.   gabapentin 300 MG capsule Commonly known as: NEURONTIN Take 1-2 capsules (300-600 mg total) by mouth See admin instructions. Take 300 mg in the afternoon, 300 mg in the evening, and 600 mg at bedtime   hydrALAZINE 50 MG tablet Commonly known as: APRESOLINE Take 1  tablet (50 mg total) by mouth every 8 (eight) hours. What changed: when to take this   [facility] Back Extra Strength 5 % Ptch Generic drug: Menthol Apply 1 patch topically daily as needed (pain).   labetalol 300 MG tablet Commonly known as: NORMODYNE Take 1 tablet (300 mg total) by mouth 3 (three) times daily.   nystatin cream Commonly known as: MYCOSTATIN Apply to affected area 2 times daily What changed:   how much to take  how to take this  when to take this  reasons to take this  additional instructions   Oxycodone HCl 10 MG Tabs Take 10 mg by mouth 5 (five) times daily.   oxymetazoline 0.05 % nasal spray Commonly known as: AFRIN Place 1 spray into both nostrils 2 (two) times daily as needed for congestion.   torsemide 20 MG tablet Commonly known as: DEMADEX Take 1 tablet (20 mg total) by mouth daily. Start taking on: August 05, 2019 What changed:   when to take this  These instructions start on August 05, 2019. If you are unsure what to do until then, ask your doctor or other care provider.  traZODone 50 MG tablet Commonly known as: DESYREL Take 50 mg by mouth at bedtime.      Follow-up Information    Lucia Gaskins, MD. Schedule an appointment as soon as possible for a visit on 08/10/2019.   Specialty: Internal Medicine Why: 8:00  Contact information: [address] [place] [address] [phone]        Liana Gerold, MD. Schedule an appointment as soon as possible for a visit in 2 weeks.   Specialty: Nephrology Why: Hospital Follow Up  Contact information: [address] [name] [address] [phone]          Allergies  Allergen Reactions  . Lactose Intolerance (Gi) Nausea And Vomiting  . Aspirin Hives  . Other Hives    Ivory soap   Allergies as of 08/02/2019      Reactions   Lactose Intolerance (gi) Nausea And Vomiting   Aspirin Hives   Other Hives   Ivory soap      Medication List    STOP  taking these medications   lisinopril-hydrochlorothiazide 20-12.5 MG tablet Commonly known as: ZESTORETIC   oxyCODONE-acetaminophen 10-325 MG tablet Commonly known as: PERCOCET     TAKE these medications   amLODipine 10 MG tablet Commonly known as: NORVASC Take 10 mg by mouth daily.   atorvastatin 10 MG tablet Commonly known as: LIPITOR Take 10 mg by mouth every evening.   cefdinir 300 MG capsule Commonly known as: OMNICEF Take 1 capsule (300 mg total) by mouth 2 (two) times daily for 2 days. Start taking on: August 03, 2019   cetirizine 10 MG tablet Commonly known as: ZYRTEC Take 10 mg by mouth daily.   cloNIDine 0.2 mg/24hr patch Commonly known as: CATAPRES - Dosed in mg/24 hr Place 1 patch (0.2 mg total) onto the skin once a week. What changed: when to take this   clopidogrel 75 MG tablet Commonly known as: PLAVIX Take 1 tablet (75 mg total) by mouth daily with breakfast.   Dialyvite Vitamin D 5000 125 MCG (5000 UT) capsule Generic drug: Cholecalciferol Take 5,000 Units by mouth daily.   diclofenac Sodium 1 % Gel Commonly known as: VOLTAREN Apply 1 application topically 4 (four) times daily as needed (pain).   EYE DROPS IRRITATION RELIEF OP Apply 1 drop to eye 2 (two) times daily as needed (allergies/irritation).   ezetimibe 10 MG tablet Commonly known as: ZETIA Take 10 mg by mouth every morning.   gabapentin 300 MG capsule Commonly known as: NEURONTIN Take 1-2 capsules (300-600 mg total) by mouth See admin instructions. Take 300 mg in the afternoon, 300 mg in the evening, and 600 mg at bedtime   hydrALAZINE 50 MG tablet Commonly known as: APRESOLINE Take 1 tablet (50 mg total) by mouth every 8 (eight) hours. What changed: when to take this   [facility] Back Extra Strength 5 % Ptch Generic drug: Menthol Apply 1 patch topically daily as needed (pain).   labetalol 300 MG tablet Commonly known as: NORMODYNE Take 1 tablet (300 mg total) by mouth 3  (three) times daily.   nystatin cream Commonly known as: MYCOSTATIN Apply to affected area 2 times daily What changed:   how much to take  how to take this  when to take this  reasons to take this  additional instructions   Oxycodone HCl 10 MG Tabs Take 10 mg by mouth 5 (five) times daily.   oxymetazoline 0.05 % nasal spray Commonly known as: AFRIN Place 1  spray into both nostrils 2 (two) times daily as needed for congestion.   torsemide 20 MG tablet Commonly known as: DEMADEX Take 1 tablet (20 mg total) by mouth daily. Start taking on: August 05, 2019 What changed:   when to take this  These instructions start on August 05, 2019. If you are unsure what to do until then, ask your doctor or other care provider.   traZODone 50 MG tablet Commonly known as: DESYREL Take 50 mg by mouth at bedtime.       Procedures/Studies: NM Pulmonary Perfusion  Result Date: 07/29/2019 CLINICAL DATA:  Shortness of breath and chest pain. Positive D-dimer. EXAM: NUCLEAR MEDICINE PERFUSION LUNG SCAN TECHNIQUE: Perfusion images were obtained in multiple projections after intravenous injection of radiopharmaceutical. Ventilation scans intentionally deferred if perfusion scan and chest x-ray adequate for interpretation during COVID 19 epidemic. RADIOPHARMACEUTICALS:  1.5 mCi Tc-37m MAA IV COMPARISON:  Chest x-ray dated 07/29/2019 FINDINGS: The perfusion study is normal. IMPRESSION: No evidence of pulmonary embolism.  Normal perfusion to both lungs. Electronically Signed   By: Lorriane Shire M.D.   On: 07/29/2019 18:24   US Renal  Result Date: 07/30/2019 CLINICAL DATA:  63 year old presenting with acute kidney injury. EXAM: RENAL / URINARY TRACT ULTRASOUND COMPLETE COMPARISON:  02/09/2019. FINDINGS: Right Kidney: Renal measurements: Approximately 9.4 x 4.9 x 5.6 cm = volume: 139 mL. No hydronephrosis. Well-preserved cortex. No shadowing calculi. Normal parenchymal echotexture. No focal  parenchymal abnormality. Left Kidney: Renal measurements: Approximately 9.6 x 5.0 x 4.6 cm = volume: 118 mL. No hydronephrosis. Well-preserved cortex. No shadowing calculi. Normal parenchymal echotexture. No focal parenchymal abnormality. Bladder: Decompressed and normal in appearance with a volume of approximately 104 mL. Other: None. IMPRESSION: Normal examination. Electronically Signed   By: Evangeline Dakin M.D.   On: 07/30/2019 15:52   Venous Doppler [facility] ONLY  Result Date: 07/30/2019 CLINICAL DATA:  63 year old female with bilateral lower extremity pain for 1 week EXAM: BILATERAL LOWER EXTREMITY VENOUS DOPPLER ULTRASOUND TECHNIQUE: Gray-scale sonography with graded compression, as well as color Doppler and duplex ultrasound were performed to evaluate the lower extremity deep venous systems from the level of the common femoral vein and including the common femoral, femoral, profunda femoral, popliteal and calf veins including the posterior tibial, peroneal and gastrocnemius veins when visible. The superficial great saphenous vein was also interrogated. Spectral Doppler was utilized to evaluate flow at rest and with distal augmentation maneuvers in the common femoral, femoral and popliteal veins. COMPARISON:  None. FINDINGS: RIGHT LOWER EXTREMITY Common Femoral Vein: No evidence of thrombus. Normal compressibility, respiratory phasicity and response to augmentation. Saphenofemoral Junction: No evidence of thrombus. Normal compressibility and flow on color Doppler imaging. Profunda Femoral Vein: No evidence of thrombus. Normal compressibility and flow on color Doppler imaging. Femoral Vein: No evidence of thrombus. Normal compressibility, respiratory phasicity and response to augmentation. Popliteal Vein: No evidence of thrombus. Normal compressibility, respiratory phasicity and response to augmentation. Calf Veins: No evidence of thrombus. Normal compressibility and flow on color Doppler imaging.  Superficial Great Saphenous Vein: No evidence of thrombus. Normal compressibility. Venous Reflux:  None. Other Findings:  None. LEFT LOWER EXTREMITY Common Femoral Vein: No evidence of thrombus. Normal compressibility, respiratory phasicity and response to augmentation. Saphenofemoral Junction: No evidence of thrombus. Normal compressibility and flow on color Doppler imaging. Profunda Femoral Vein: No evidence of thrombus. Normal compressibility and flow on color Doppler imaging. Femoral Vein: No evidence of thrombus. Normal compressibility, respiratory phasicity and response to augmentation. Popliteal  Vein: No evidence of thrombus. Normal compressibility, respiratory phasicity and response to augmentation. Calf Veins: No evidence of thrombus. Normal compressibility and flow on color Doppler imaging. Superficial Great Saphenous Vein: No evidence of thrombus. Normal compressibility. Venous Reflux:  None. Other Findings:  None. IMPRESSION: No evidence of deep venous thrombosis in either lower extremity. Electronically Signed   By: Jacqulynn Cadet M.D.   On: 07/30/2019 08:59   DG CHEST PORT 1 VIEW  Result Date: 07/31/2019 CLINICAL DATA:  Headaches and fatigue EXAM: PORTABLE CHEST 1 VIEW COMPARISON:  07/29/2019 FINDINGS: The heart size and mediastinal contours are within normal limits. Both lungs are clear. The visualized skeletal structures are unremarkable. IMPRESSION: No active disease. Electronically Signed   By: Ulyses Jarred M.D.   On: 07/31/2019 05:10   DG Chest Port 1 View  Result Date: 07/29/2019 CLINICAL DATA:  Shortness of breath EXAM: PORTABLE CHEST 1 VIEW COMPARISON:  February 23, 2019 FINDINGS: No edema or consolidation. Heart is upper normal in size with pulmonary vascularity normal. No adenopathy. There is degenerative change in the thoracic spine. IMPRESSION: No edema or consolidation. Electronically Signed   By: Lowella Grip III M.D.   On: 07/29/2019 13:25      Subjective: Patient  says she feels a lot better.  She is having no shortness of breath or chest pain.  Discharge Exam: Vitals:   08/02/19 0448 08/02/19 0819  BP: (!) 177/52 (!) 183/73  Pulse: 69 70  Resp: 17 18  Temp: 98.1 F (36.7 C)   SpO2: 98% 100%   Vitals:   08/01/19 2233 08/02/19 0448 08/02/19 0500 08/02/19 0819  BP: (!) 142/59 (!) 177/52  (!) 183/73  Pulse: 74 69  70  Resp:  17  18  Temp:  98.1 F (36.7 C)    TempSrc:      SpO2:  98%  100%  Weight:   111.1 kg   Height:       General: Pt is alert, awake, not in acute distress Cardiovascular: RRR, S1/S2 +, no rubs, no gallops Respiratory: CTA bilaterally, no wheezing, no rhonchi Abdominal: Soft, NT, ND, bowel sounds + Extremities: no edema, no cyanosis   The results of significant diagnostics from this hospitalization (including imaging, microbiology, ancillary and laboratory) are listed below for reference.     Microbiology: Recent Results (from the past 240 hour(s))  SARS CORONAVIRUS 2 (TAT 6-24 HRS) Nasopharyngeal Nasopharyngeal Swab     Status: None   Collection Time: 07/29/19  2:55 PM   Specimen: Nasopharyngeal Swab  Result Value Ref Range Status   SARS Coronavirus 2 NEGATIVE NEGATIVE Final    Comment: (NOTE) SARS-CoV-2 target nucleic acids are NOT DETECTED. The SARS-CoV-2 RNA is generally detectable in upper and lower respiratory specimens during the acute phase of infection. Negative results do not preclude SARS-CoV-2 infection, do not rule out co-infections with other pathogens, and should not be used as the sole basis for treatment or other patient management decisions. Negative results must be combined with clinical observations, patient history, and epidemiological information. The expected result is Negative. Fact Sheet for Patients: SugarRoll.be Fact Sheet for Healthcare Providers: https://www.woods-mathews.com/ This test is not yet approved or cleared by the [place]  FDA and  has been authorized for detection and/or diagnosis of SARS-CoV-2 by FDA under an Emergency Use Authorization (EUA). This EUA will remain  in effect (meaning this test can be used) for the duration of the COVID-19 declaration under Section 56 4(b)(1) of the Act, 21 U.S.C.  section 360bbb-3(b)(1), unless the authorization is terminated or revoked sooner. Performed at [facility], [place] [address] [place], [place] [address]   Respiratory Panel by RT PCR (Flu A&B, Covid) - Nasopharyngeal Swab     Status: None   Collection Time: 07/29/19  3:58 PM   Specimen: Nasopharyngeal Swab  Result Value Ref Range Status   SARS Coronavirus 2 by RT PCR NEGATIVE NEGATIVE Final    Comment: (NOTE) SARS-CoV-2 target nucleic acids are NOT DETECTED. The SARS-CoV-2 RNA is generally detectable in upper respiratoy specimens during the acute phase of infection. The lowest concentration of SARS-CoV-2 viral copies this assay can detect is 131 copies/mL. A negative result does not preclude SARS-Cov-2 infection and should not be used as the sole basis for treatment or other patient management decisions. A negative result may occur with  improper specimen collection/handling, submission of specimen other than nasopharyngeal swab, presence of viral mutation(s) within the areas targeted by this assay, and inadequate number of viral copies (<131 copies/mL). A negative result must be combined with clinical observations, patient history, and epidemiological information. The expected result is Negative. Fact Sheet for Patients:  PinkCheek.be Fact Sheet for Healthcare Providers:  GravelBags.it This test is not yet ap proved or cleared by the [place] FDA and  has been authorized for detection and/or diagnosis of SARS-CoV-2 by FDA under an Emergency Use Authorization (EUA). This EUA will remain  in effect (meaning this test can be used) for the  duration of the COVID-19 declaration under Section 564(b)(1) of the Act, 21 U.S.C. section 360bbb-3(b)(1), unless the authorization is terminated or revoked sooner.    Influenza A by PCR NEGATIVE NEGATIVE Final   Influenza B by PCR NEGATIVE NEGATIVE Final    Comment: (NOTE) The Xpert Xpress SARS-CoV-2/FLU/RSV assay is intended as an aid in  the diagnosis of influenza from Nasopharyngeal swab specimens and  should not be used as a sole basis for treatment. Nasal washings and  aspirates are unacceptable for Xpert Xpress SARS-CoV-2/FLU/RSV  testing. Fact Sheet for Patients: PinkCheek.be Fact Sheet for Healthcare Providers: GravelBags.it This test is not yet approved or cleared by the [place] FDA and  has been authorized for detection and/or diagnosis of SARS-CoV-2 by  FDA under an Emergency Use Authorization (EUA). This EUA will remain  in effect (meaning this test can be used) for the duration of the  Covid-19 declaration under Section 564(b)(1) of the Act, 21  U.S.C. section 360bbb-3(b)(1), unless the authorization is  terminated or revoked. Performed at [facility], [address], [place], [place] [address]      Labs: BNP (last 3 results) Recent Labs    07/29/19 1322  BNP [id number]   Basic Metabolic Panel: Recent Labs  Lab 07/29/19 1322 07/30/19 0744 07/31/19 0618 08/01/19 0622 08/02/19 0535  NA 136 137 135 138 141  K 3.4* 3.4* 3.3* 3.7 4.0  CL 102 103 101 108 108  CO2 21* 21* 22 20* 20*  GLUCOSE 121* 122* 104* 101* 108*  BUN 29* 36* 43* 37* 31*  CREATININE 3.80* 5.56* 5.73* 3.04* 2.00*  CALCIUM 8.7* 8.3* 7.9* 8.3* 8.7*  MG  --   --  1.7 1.8 1.7  PHOS  --   --  4.7* 3.3 3.1   Liver Function Tests: Recent Labs  Lab 07/29/19 1322 07/31/19 0618 08/01/19 0622 08/02/19 0535  AST 17  --   --   --   ALT 11  --   --   --  ALKPHOS 84  --   --   --   BILITOT 0.8  --   --   --   PROT 7.5  --   --   --    ALBUMIN 3.8 3.0* 2.8* 2.9*   Recent Labs  Lab 07/29/19 1322  LIPASE 29   No results for input(s): AMMONIA in the last 168 hours. CBC: Recent Labs  Lab 07/29/19 1322 07/30/19 0744 07/31/19 0618 08/01/19 0622 08/02/19 0001 08/02/19 0535  WBC 10.1 9.8 8.2 6.4  --  7.1  NEUTROABS 7.7  --   --   --   --   --   HGB 9.6* 8.4* 7.4* 7.0* 8.9* 8.8*  HCT 31.0* 27.1* 25.3* 22.6* 28.3* 29.0*  MCV 81.2 81.9 83.8 81.9  --  82.6  PLT 433* 325 299 297  --  308   Cardiac Enzymes: No results for input(s): CKTOTAL, CKMB, CKMBINDEX, TROPONINI in the last 168 hours. BNP: Invalid input(s): POCBNP CBG: No results for input(s): GLUCAP in the last 168 hours. D-Dimer No results for input(s): DDIMER in the last 72 hours. Hgb A1c No results for input(s): HGBA1C in the last 72 hours. Lipid Profile No results for input(s): CHOL, HDL, LDLCALC, TRIG, CHOLHDL, LDLDIRECT in the last 72 hours. Thyroid function studies No results for input(s): TSH, T4TOTAL, T3FREE, THYROIDAB in the last 72 hours.  Invalid input(s): FREET3 Anemia work up No results for input(s): VITAMINB12, FOLATE, FERRITIN, TIBC, IRON, RETICCTPCT in the last 72 hours. Urinalysis    Component Value Date/Time   COLORURINE YELLOW 07/31/2019 1412   APPEARANCEUR CLOUDY (A) 07/31/2019 1412   LABSPEC 1.011 07/31/2019 1412   PHURINE 5.0 07/31/2019 1412   GLUCOSEU NEGATIVE 07/31/2019 1412   HGBUR NEGATIVE 07/31/2019 1412   [place] 07/31/2019 1412   KETONESUR NEGATIVE 07/31/2019 1412   PROTEINUR 30 (A) 07/31/2019 1412   UROBILINOGEN 0.2 06/10/2013 1741   NITRITE NEGATIVE 07/31/2019 1412   LEUKOCYTESUR LARGE (A) 07/31/2019 1412   Sepsis Labs Invalid input(s): PROCALCITONIN,  WBC,  LACTICIDVEN Microbiology Recent Results (from the past 240 hour(s))  SARS CORONAVIRUS 2 (TAT 6-24 HRS) Nasopharyngeal Nasopharyngeal Swab     Status: None   Collection Time: 07/29/19  2:55 PM   Specimen: Nasopharyngeal Swab  Result Value Ref  Range Status   SARS Coronavirus 2 NEGATIVE NEGATIVE Final    Comment: (NOTE) SARS-CoV-2 target nucleic acids are NOT DETECTED. The SARS-CoV-2 RNA is generally detectable in upper and lower respiratory specimens during the acute phase of infection. Negative results do not preclude SARS-CoV-2 infection, do not rule out co-infections with other pathogens, and should not be used as the sole basis for treatment or other patient management decisions. Negative results must be combined with clinical observations, patient history, and epidemiological information. The expected result is Negative. Fact Sheet for Patients: SugarRoll.be Fact Sheet for Healthcare Providers: https://www.woods-mathews.com/ This test is not yet approved or cleared by the [place] FDA and  has been authorized for detection and/or diagnosis of SARS-CoV-2 by FDA under an Emergency Use Authorization (EUA). This EUA will remain  in effect (meaning this test can be used) for the duration of the COVID-19 declaration under Section 56 4(b)(1) of the Act, 21 U.S.C. section 360bbb-3(b)(1), unless the authorization is terminated or revoked sooner. Performed at [facility], [place] [address], [place], [place] [address]   Respiratory Panel by RT PCR (Flu A&B, Covid) - Nasopharyngeal Swab     Status: None   Collection Time: 07/29/19  3:58 PM   Specimen: Nasopharyngeal Swab  Result Value Ref Range Status   SARS Coronavirus 2 by RT PCR NEGATIVE NEGATIVE Final    Comment: (NOTE) SARS-CoV-2 target nucleic acids are NOT DETECTED. The SARS-CoV-2 RNA is generally detectable in upper respiratoy specimens during the acute phase of infection. The lowest concentration of SARS-CoV-2 viral copies this assay can detect is 131 copies/mL. A negative result does not preclude SARS-Cov-2 infection and should not be used as the sole basis for treatment or other patient management decisions. A  negative result may occur with  improper specimen collection/handling, submission of specimen other than nasopharyngeal swab, presence of viral mutation(s) within the areas targeted by this assay, and inadequate number of viral copies (<131 copies/mL). A negative result must be combined with clinical observations, patient history, and epidemiological information. The expected result is Negative. Fact Sheet for Patients:  PinkCheek.be Fact Sheet for Healthcare Providers:  GravelBags.it This test is not yet ap proved or cleared by the [place] FDA and  has been authorized for detection and/or diagnosis of SARS-CoV-2 by FDA under an Emergency Use Authorization (EUA). This EUA will remain  in effect (meaning this test can be used) for the duration of the COVID-19 declaration under Section 564(b)(1) of the Act, 21 U.S.C. section 360bbb-3(b)(1), unless the authorization is terminated or revoked sooner.    Influenza A by PCR NEGATIVE NEGATIVE Final   Influenza B by PCR NEGATIVE NEGATIVE Final    Comment: (NOTE) The Xpert Xpress SARS-CoV-2/FLU/RSV assay is intended as an aid in  the diagnosis of influenza from Nasopharyngeal swab specimens and  should not be used as a sole basis for treatment. Nasal washings and  aspirates are unacceptable for Xpert Xpress SARS-CoV-2/FLU/RSV  testing. Fact Sheet for Patients: PinkCheek.be Fact Sheet for Healthcare Providers: GravelBags.it This test is not yet approved or cleared by the [place] FDA and  has been authorized for detection and/or diagnosis of SARS-CoV-2 by  FDA under an Emergency Use Authorization (EUA). This EUA will remain  in effect (meaning this test can be used) for the duration of the  Covid-19 declaration under Section 564(b)(1) of the Act, 21  U.S.C. section 360bbb-3(b)(1), unless the authorization is   terminated or revoked. Performed at [facility], [address] [place], [place] [address]     Time coordinating discharge: 32 minutes   SIGNED:  Irwin Brakeman, MD  [facility] 08/02/2019, 9:57 AM How to contact the [facility] Attending or Consulting provider [place] or covering provider during after hours [place], for this patient?  1. Check the care team in [facility] and look for a) attending/consulting [facility] provider listed and b) the [facility] team listed 2. Log into [email or web address] and use [place]'s universal password to access. If you do not have the password, please contact the hospital operator. 3. Locate the [facility] provider you are looking for under [facility] and page to a number that you can be directly reached. 4. If you still have difficulty reaching the provider, please page the [facility] (Director on Call) for the Hospitalists listed on amion for assistance.

## 2019-08-02 NOTE — Discharge Instructions (Signed)
Acute Kidney Injury, Adult  Acute kidney injury is a sudden worsening of kidney function. The kidneys are organs that have several jobs. They filter the blood to remove waste products and extra fluid. They also maintain a healthy balance of minerals and hormones in the body, which helps control blood pressure and keep bones strong. With this condition, your kidneys do not do their jobs as well as they should. This condition ranges from mild to severe. Over time it may develop into long-lasting (chronic) kidney disease. Early detection and treatment may prevent acute kidney injury from developing into a chronic condition. What are the causes? Common causes of this condition include:  A problem with blood flow to the kidneys. This may be caused by: ? Low blood pressure (hypotension) or shock. ? Blood loss. ? Heart and blood vessel (cardiovascular) disease. ? Severe burns. ? Liver disease.  Direct damage to the kidneys. This may be caused by: ? Certain medicines. ? A kidney infection. ? Poisoning. ? Being around or in contact with toxic substances. ? A surgical wound. ? A hard, direct hit to the kidney area.  A sudden blockage of urine flow. This may be caused by: ? Cancer. ? Kidney stones. ? An enlarged prostate in males. What are the signs or symptoms? Symptoms of this condition may not be obvious until the condition becomes severe. Symptoms of this condition can include:  Tiredness (lethargy), or difficulty staying awake.  Nausea or vomiting.  Swelling (edema) of the face, legs, ankles, or feet.  Problems with urination, such as: ? Abdominal pain, or pain along the side of your stomach (flank). ? Decreased urine production. ? Decrease in the force of urine flow.  Muscle twitches and cramps, especially in the legs.  Confusion or trouble concentrating.  Loss of appetite.  Fever. How is this diagnosed? This condition may be diagnosed with tests, including:  Blood  tests.  Urine tests.  Imaging tests.  A test in which a sample of tissue is removed from the kidneys to be examined under a microscope (kidney biopsy). How is this treated? Treatment for this condition depends on the cause and how severe the condition is. In mild cases, treatment may not be needed. The kidneys may heal on their own. In more severe cases, treatment will involve:  Treating the cause of the kidney injury. This may involve changing any medicines you are taking or adjusting your dosage.  Fluids. You may need specialized IV fluids to balance your body's needs.  Having a catheter placed to drain urine and prevent blockages.  Preventing problems from occurring. This may mean avoiding certain medicines or procedures that can cause further injury to the kidneys. In some cases treatment may also require:  A procedure to remove toxic wastes from the body (dialysis or continuous renal replacement therapy - CRRT).  Surgery. This may be done to repair a torn kidney, or to remove the blockage from the urinary system. Follow these instructions at home: Medicines  Take over-the-counter and prescription medicines only as told by your health care provider.  Do not take any new medicines without your health care provider's approval. Many medicines can worsen your kidney damage.  Do not take any vitamin and mineral supplements without your health care provider's approval. Many nutritional supplements can worsen your kidney damage. Lifestyle  If your health care provider prescribed changes to your diet, follow them. You may need to decrease the amount of protein you eat.  Achieve and maintain a healthy   weight. If you need help with this, ask your health care provider.  Start or continue an exercise plan. Try to exercise at least 30 minutes a day, 5 days a week.  Do not use any tobacco products, such as cigarettes, chewing tobacco, and e-cigarettes. If you need help quitting, ask your  health care provider. General instructions  Keep track of your blood pressure. Report changes in your blood pressure as told by your health care provider.  Stay up to date with immunizations. Ask your health care provider which immunizations you need.  Keep all follow-up visits as told by your health care provider. This is important. Where to find more information  American Association of Kidney Patients: BombTimer.gl  National Kidney Foundation: www.kidney.[place]: https://mathis.com/  Life Options Rehabilitation Program: ? www.lifeoptions.org ? www.kidneyschool.org Contact a health care provider if:  Your symptoms get worse.  You develop new symptoms. Get help right away if:  You develop symptoms of worsening kidney disease, which include: ? Headaches. ? Abnormally dark or light skin. ? Easy bruising. ? Frequent hiccups. ? Chest pain. ? Shortness of breath. ? End of menstruation in women. ? Seizures. ? Confusion or altered mental status. ? Abdominal or back pain. ? Itchiness.  You have a fever.  Your body is producing less urine.  You have pain or bleeding when you urinate. Summary  Acute kidney injury is a sudden worsening of kidney function.  Acute kidney injury can be caused by problems with blood flow to the kidneys, direct damage to the kidneys, and sudden blockage of urine flow.  Symptoms of this condition may not be obvious until it becomes severe. Symptoms may include edema, lethargy, confusion, nausea or vomiting, and problems passing urine.  This condition can usually be diagnosed with blood tests, urine tests, and imaging tests. Sometimes a kidney biopsy is done to diagnose this condition.  Treatment for this condition often involves treating the underlying cause. It is treated with fluids, medicines, dialysis, diet changes, or surgery. This information is not intended to replace advice given to you by your health care provider. Make  sure you discuss any questions you have with your health care provider. Document Released: 02/04/2011 Document Revised: 07/04/2017 Document Reviewed: 07/12/2016 Elsevier Patient Education  2020 [place].    IMPORTANT INFORMATION: PAY CLOSE ATTENTION   PHYSICIAN DISCHARGE INSTRUCTIONS  Follow with Primary care provider  Lucia Gaskins, MD  and other consultants as instructed by your Hospitalist Physician  [place] IF SYMPTOMS COME BACK, WORSEN OR NEW PROBLEM DEVELOPS   Please note: You were cared for by a hospitalist during your hospital stay. Every effort will be made to forward records to your primary care provider.  You can request that your primary care provider send for your hospital records if they have not received them.  Once you are discharged, your primary care physician will handle any further medical issues. Please note that NO REFILLS for any discharge medications will be authorized once you are discharged, as it is imperative that you return to your primary care physician (or establish a relationship with a primary care physician if you do not have one) for your post hospital discharge needs so that they can reassess your need for medications and monitor your lab values.  Please get a complete blood count and chemistry panel checked by your Primary MD at your next visit, and again as instructed by your Primary MD.  Get Medicines reviewed  and adjusted: Please take all your medications with you for your next visit with your Primary MD  Laboratory/radiological data: Please request your Primary MD to go over all hospital tests and procedure/radiological results at the follow up, please ask your primary care provider to get all Hospital records sent to his/her office.  In some cases, they will be blood work, cultures and biopsy results pending at the time of your discharge. Please request that your primary care provider follow up on these  results.  If you are diabetic, please bring your blood sugar readings with you to your follow up appointment with primary care.    Please call and make your follow up appointments as soon as possible.    Also Note the following: If you experience worsening of your admission symptoms, develop shortness of breath, life threatening emergency, suicidal or homicidal thoughts you must seek medical attention immediately by calling 911 or calling your MD immediately  if symptoms less severe.  You must read complete instructions/literature along with all the possible adverse reactions/side effects for all the Medicines you take and that have been prescribed to you. Take any new Medicines after you have completely understood and accpet all the possible adverse reactions/side effects.   Do not drive when taking Pain medications or sleeping medications (Benzodiazepines)  Do not take more than prescribed Pain, Sleep and Anxiety Medications. It is not advisable to combine anxiety,sleep and pain medications without talking with your primary care practitioner  Special Instructions: If you have smoked or chewed Tobacco  in the last 2 yrs please stop smoking, stop any regular Alcohol  and or any Recreational drug use.  Wear Seat belts while driving.  Do not drive if taking any narcotic, mind altering or controlled substances or recreational drugs or alcohol.

## 2019-08-02 NOTE — Progress Notes (Signed)
Patient is to be discharged home and in stable condition. Patient given discharge instructions and verbalized understanding. Patient's IV and telemetry removed, WNL. Patient to be escorted out by staff via wheelchair upon husband's arrival.  Celestia Khat, RN

## 2019-08-03 NOTE — Progress Notes (Signed)
Late entry for 08/02/2019 Pt discharging home and expressed need for assistance with obtaining her antibiotic medications.   TOC team supervisor checked on the co pay amount to find that patients co payment would be $1.50. TOC team used petty cash fund to assist patient in getting this medication.   Pt was grateful and expressed understanding of the use of the money and what its intended for.   Marianna Transitions of Care  Clinical Social Worker  Ph: [phone]

## 2019-08-09 ENCOUNTER — Ambulatory Visit: Payer: Medicare Other | Admitting: Orthopedic Surgery

## 2019-08-11 ENCOUNTER — Ambulatory Visit: Payer: Medicare Other | Admitting: Orthopedic Surgery

## 2019-08-25 ENCOUNTER — Ambulatory Visit: Payer: Medicare Other | Admitting: Orthopedic Surgery

## 2020-01-07 ENCOUNTER — Ambulatory Visit: Payer: Medicare Other | Admitting: Gastroenterology

## 2020-04-19 ENCOUNTER — Ambulatory Visit: Payer: Medicare Other | Admitting: Orthopedic Surgery

## 2020-05-01 ENCOUNTER — Ambulatory Visit: Payer: Medicare Other | Admitting: Orthopedic Surgery

## 2020-05-22 ENCOUNTER — Other Ambulatory Visit: Payer: Medicare Other

## 2020-05-22 ENCOUNTER — Other Ambulatory Visit: Payer: Self-pay

## 2020-05-22 DIAGNOSIS — Z20822 Contact with and (suspected) exposure to covid-19: Secondary | ICD-10-CM

## 2020-05-23 LAB — NOVEL CORONAVIRUS, NAA: SARS-CoV-2, NAA: NOT DETECTED

## 2020-05-23 LAB — SARS-COV-2, NAA 2 DAY TAT

## 2020-05-23 LAB — SPECIMEN STATUS REPORT

## 2020-07-01 IMAGING — MR MRI CERVICAL SPINE WITHOUT CONTRAST
4 of 5 series · 16 of 48 positions shown · non-contrast
Comparison: CT scan of the cervical spine dated 03/05/2018 and
cervical MRI dated 01/23/2018

CLINICAL DATA: Episodic spasms of the right lower extremity. Severe
weakness of the right lower extremity. Fecal incontinence.
Anesthesia of the right foot and distal leg. Symptomatology
consistent with cervical myelopathy.

EXAM:
MRI CERVICAL SPINE WITHOUT CONTRAST
TECHNIQUE: Multiplanar, multisequence MR imaging of the cervical spine was
performed. No intravenous contrast was administered.

[Series 4: T2 · sagittal · 3.0mm · 0.27mm/px · 6 of 15 slices shown (1 of 2)]
[im 1/15]
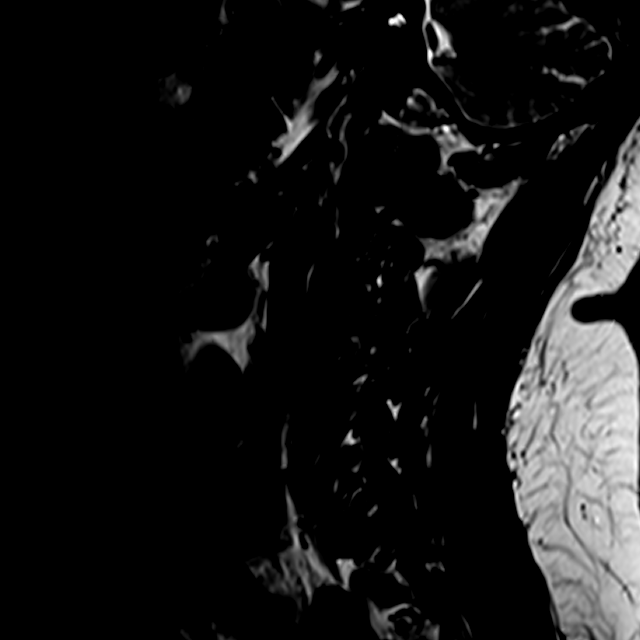
[im 3/15]
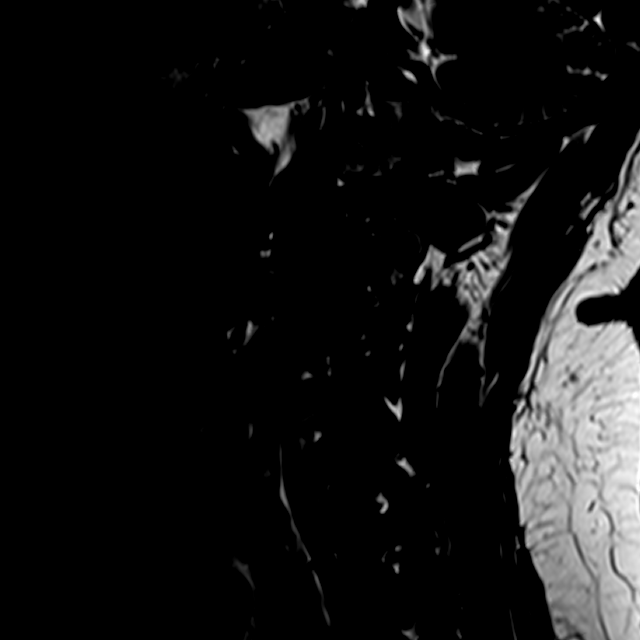
[im 6/15]
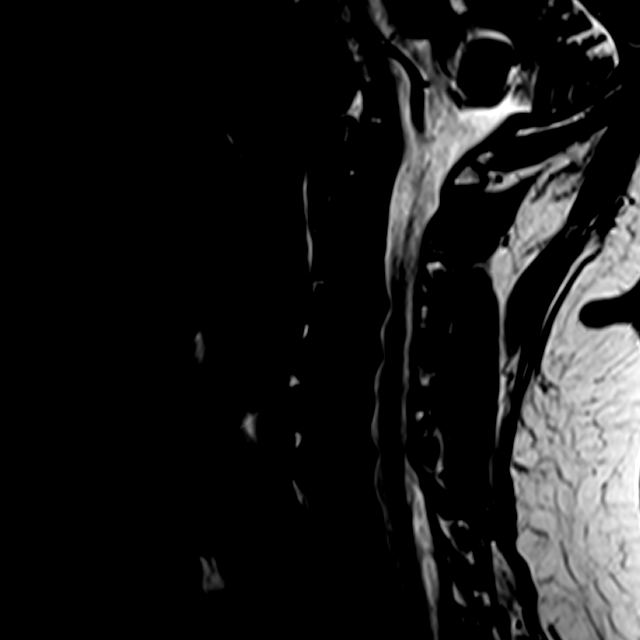
[im 9/15]
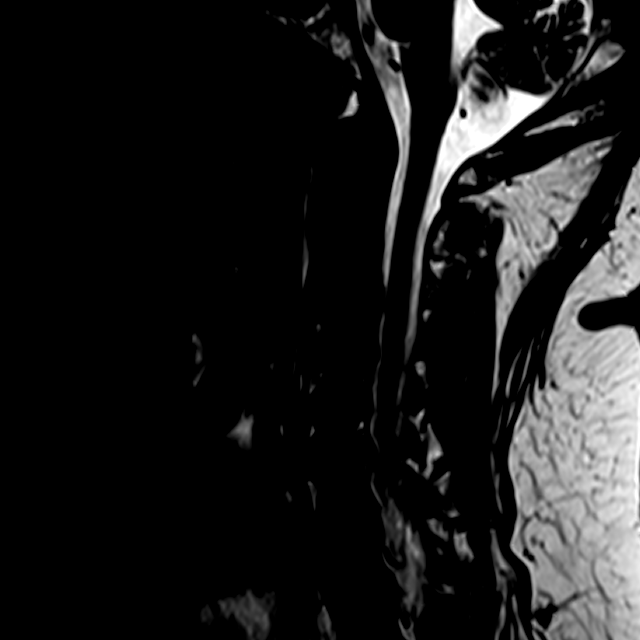
[im 12/15]
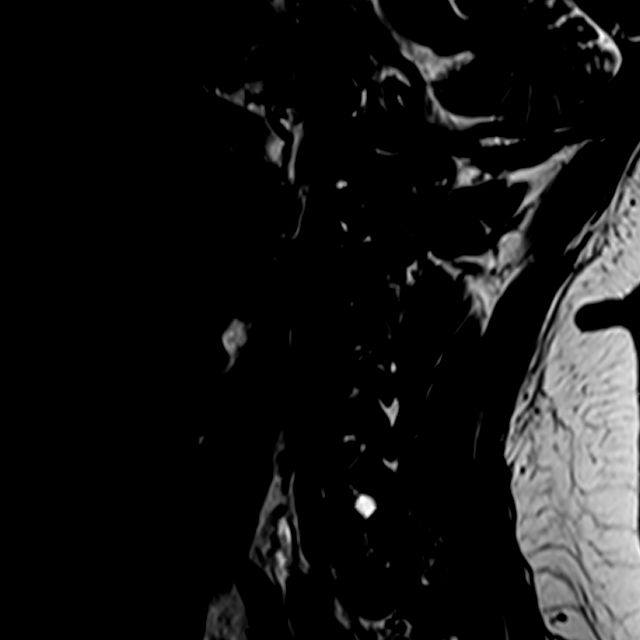
[im 15/15]
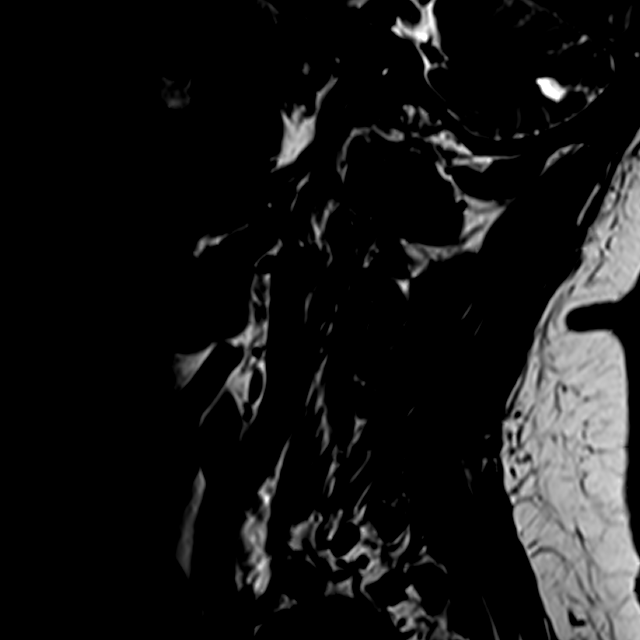

[Series 5: FLAIR · sagittal · 3.0mm · 0.42mm/px · 3 of 15 slices shown]
[im 3/15]
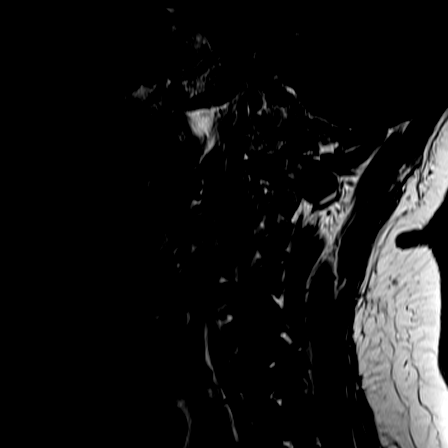
[im 8/15]
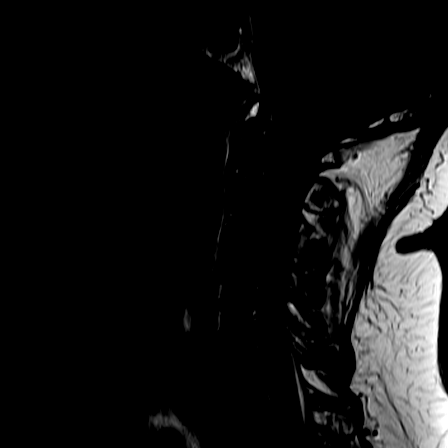
[im 12/15]
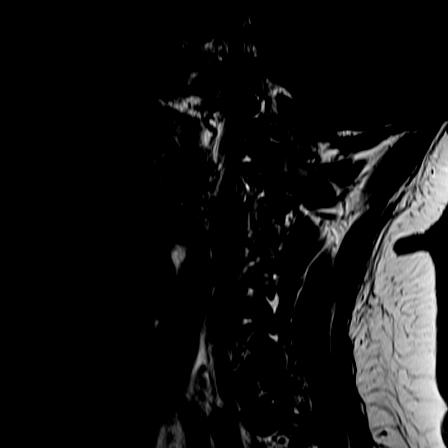

[Series 6: ir sagittal repeat · sagittal · 3.0mm · 0.31mm/px · 3 of 15 slices shown]
[im 3/15]
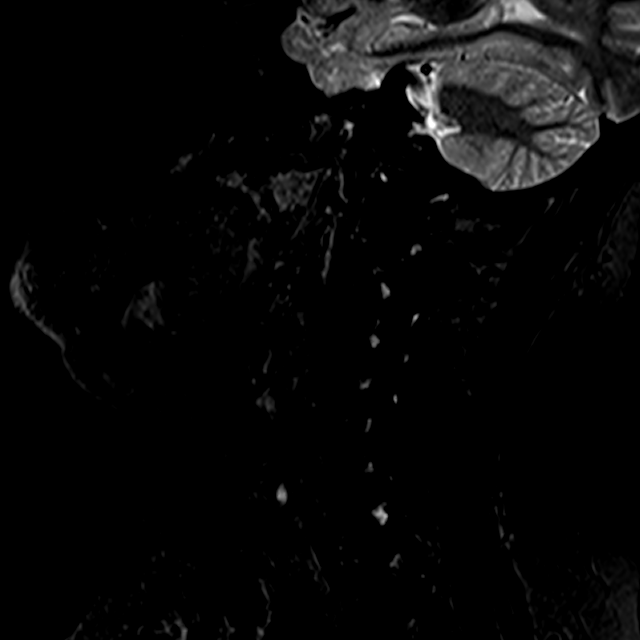
[im 8/15]
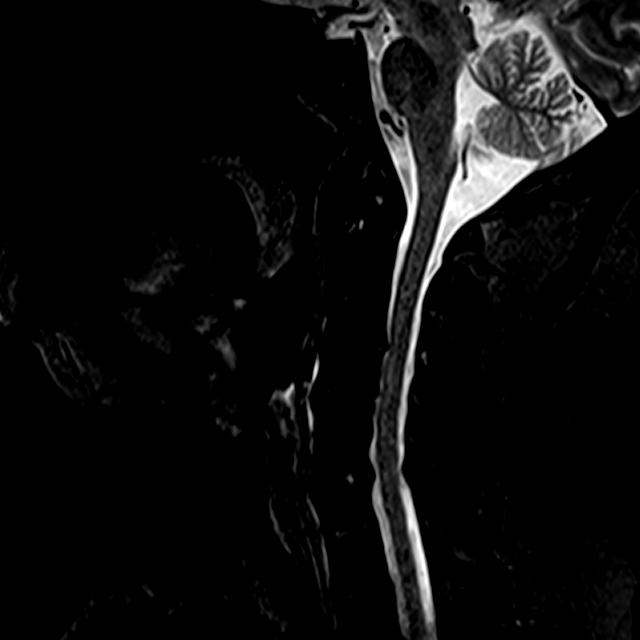
[im 12/15]
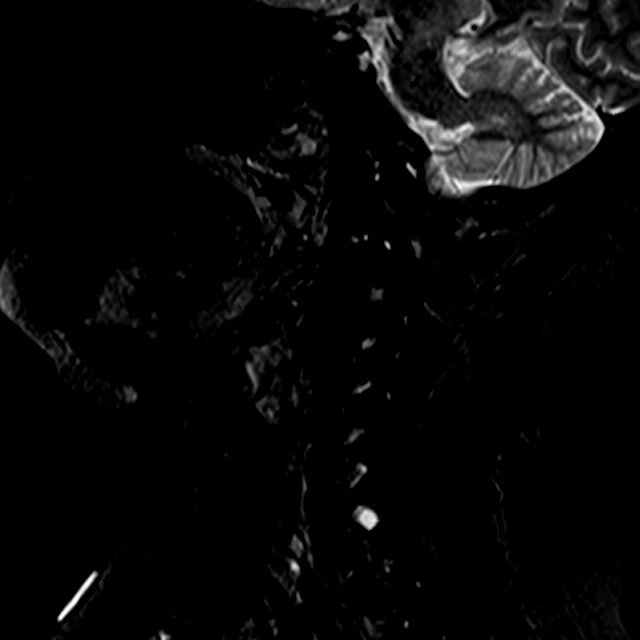

[Series 8: T2 · axial · 3.0mm · 0.19mm/px · z∈[-237,-147]mm · 4 of 36 slices shown (2 of 2)]
[im 3/36]
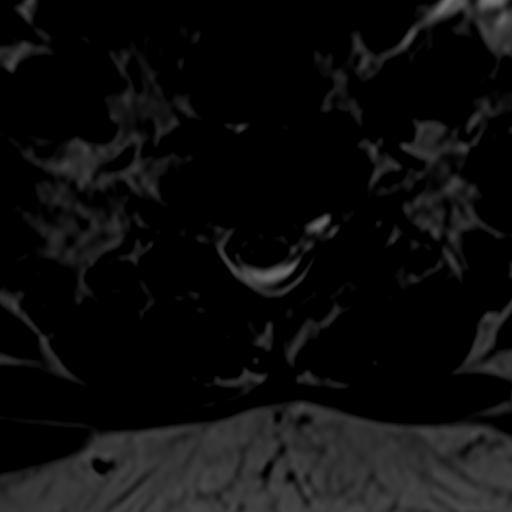
[im 5/36]
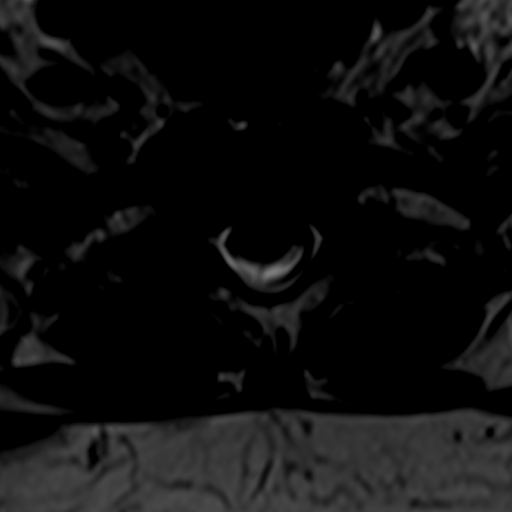
[im 19/36]
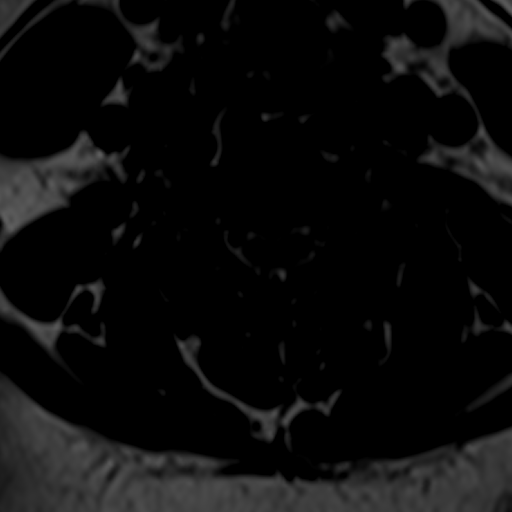
[im 31/36]
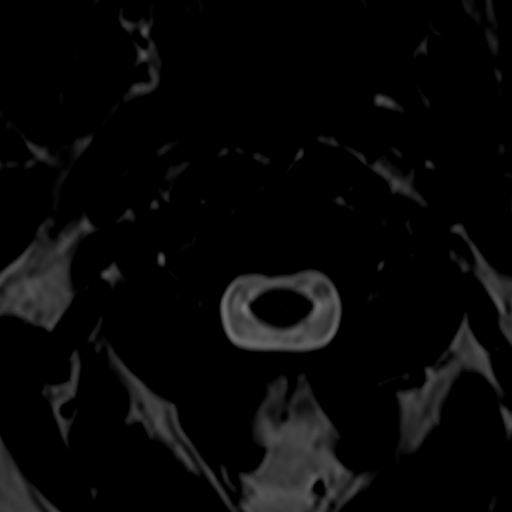

[16 of 48 positions shown; findings below may reference images not displayed]

FINDINGS: Alignment: Physiologic.

Vertebrae: No fracture, evidence of discitis, or bone lesion.

Cord: Normal signal and morphology.

Posterior Fossa, vertebral arteries, paraspinal tissues: Negative.

Disc levels:

C2-3: Negative.

C3-4: Small central subligamentous disc protrusion slightly
asymmetric to the right touching the ventral aspect of the spinal
cord but without focal neural impingement.

C4-5: Small broad-based soft disc protrusion without neural
impingement.

C5-6: Tiny broad-based disc bulge with no neural impingement.
Uncinate spurs slightly narrow the right neural foramen, unchanged.

C6-7: Small broad-based disc bulge without neural impingement.
Uncinate spurs narrow the left neural foramen, unchanged.
IMPRESSION: 1. No discrete cervical myelopathy.
2. Multilevel degenerative disc disease with small disc protrusions
at C3-4 and C4-5 without focal neural impingement. Three slight
right foraminal narrowing at C5-6 and slight left foraminal
narrowing at C6-7 due to uncinate spurs, unchanged.

## 2020-07-03 IMAGING — US BILATERAL CAROTID DUPLEX ULTRASOUND
1 series · 13 of 24 positions shown · non-contrast
Comparison: 01/23/2018

CLINICAL DATA: Recent stroke.

EXAM:
BILATERAL CAROTID DUPLEX ULTRASOUND
TECHNIQUE: Gray scale imaging, color Doppler and duplex ultrasound were
performed of bilateral carotid and vertebral arteries in the neck.

[Series 1: bilateral carotid duplex ultrasound · 13 of 89 slices shown]
[im 1/89]
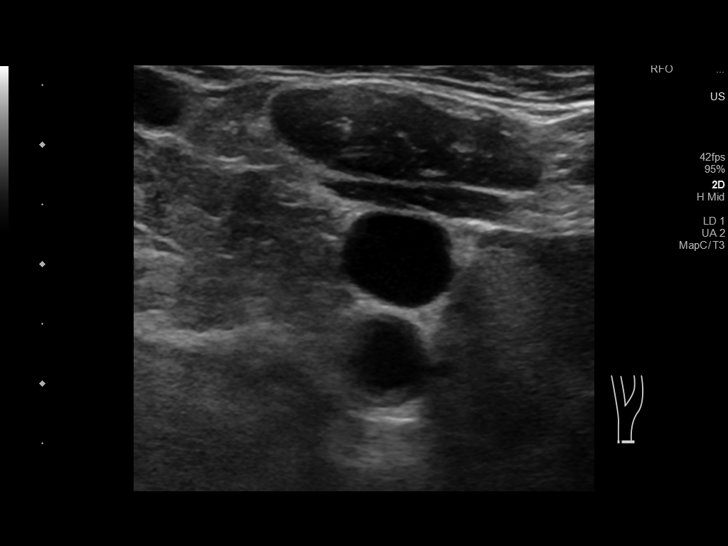
[im 8/89]
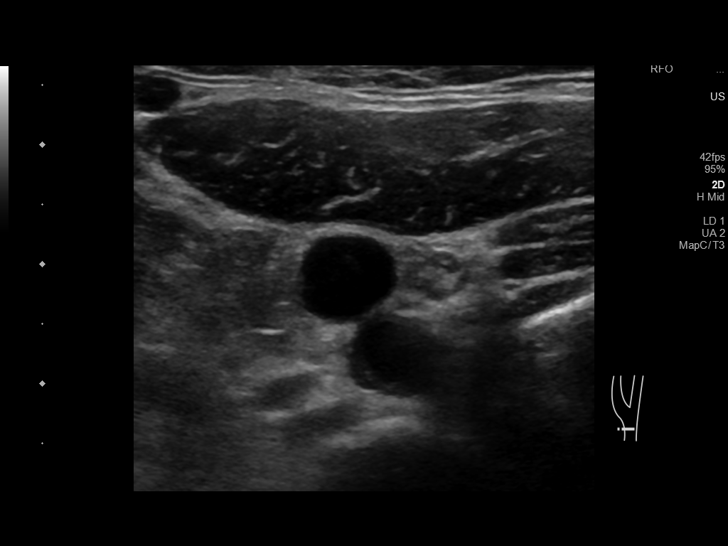
[im 16/89]
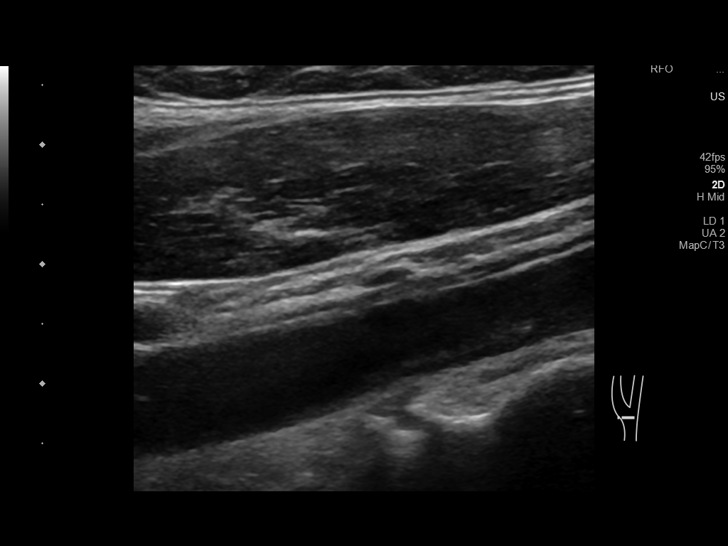
[im 23/89]
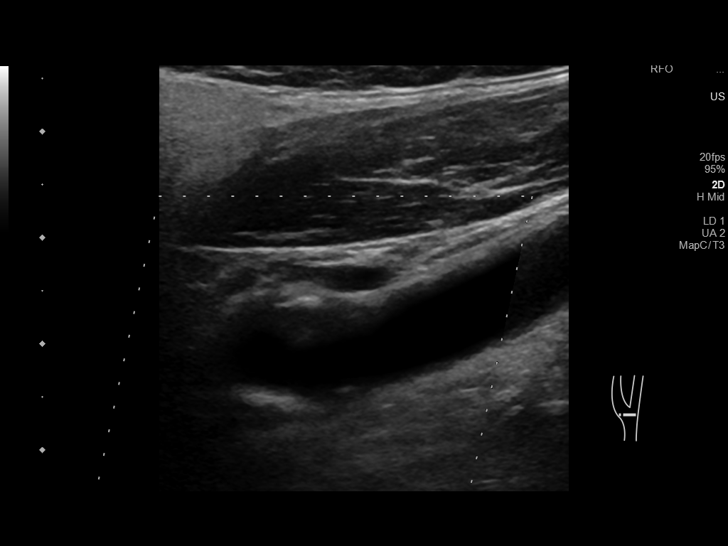
[im 31/89]
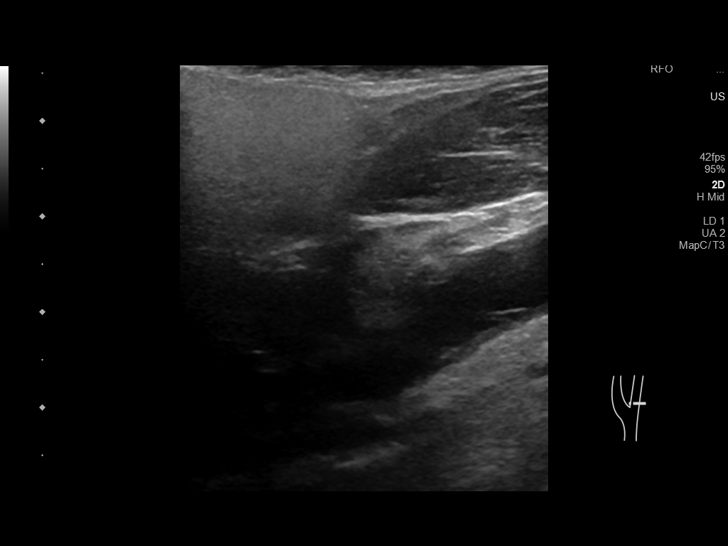
[im 39/89]
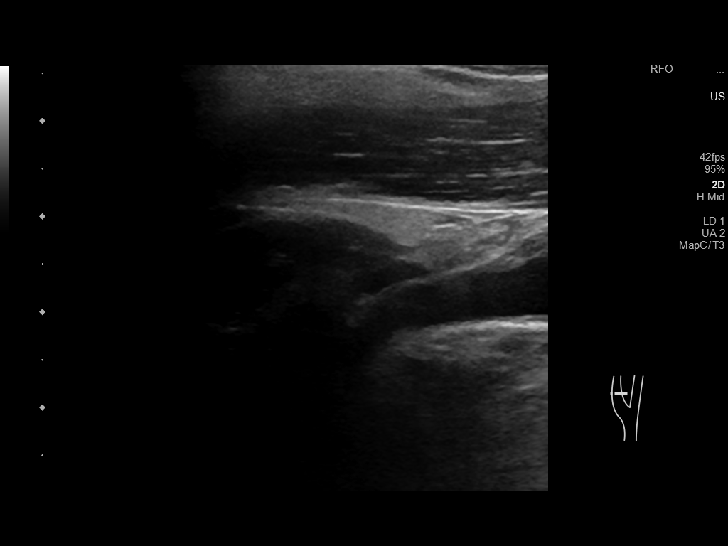
[im 46/89]
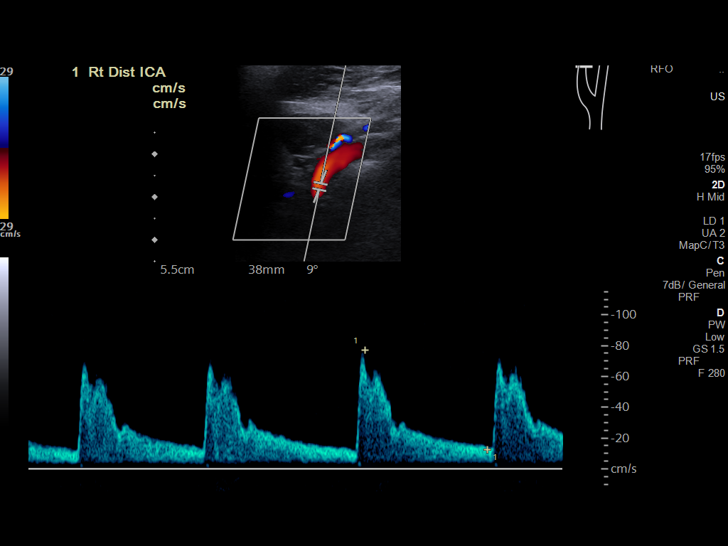
[im 50/89]
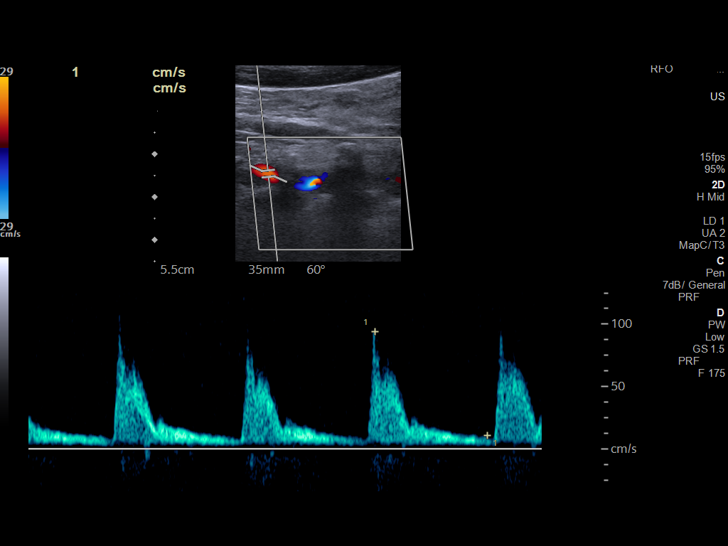
[im 58/89]
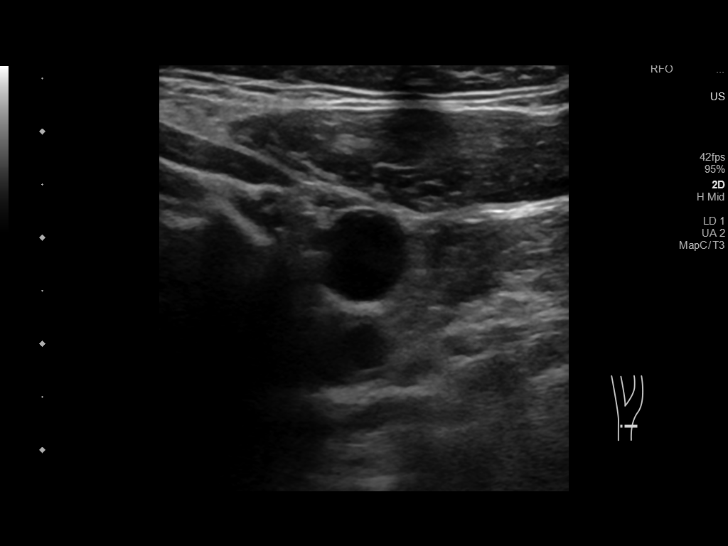
[im 66/89]
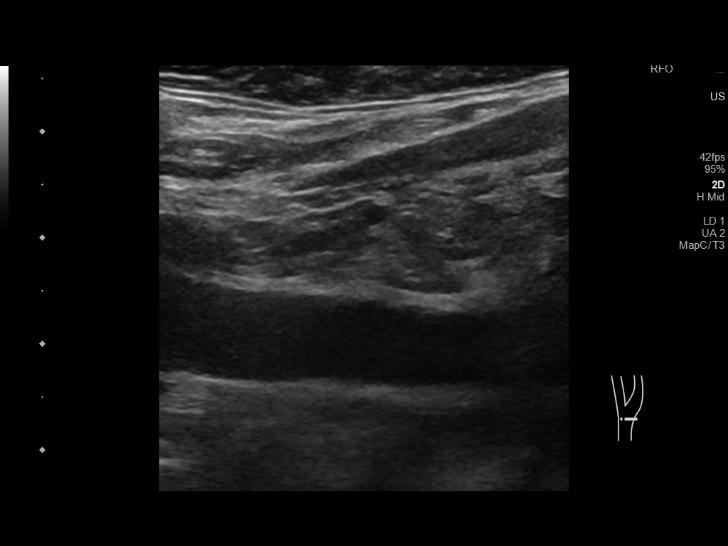
[im 73/89]
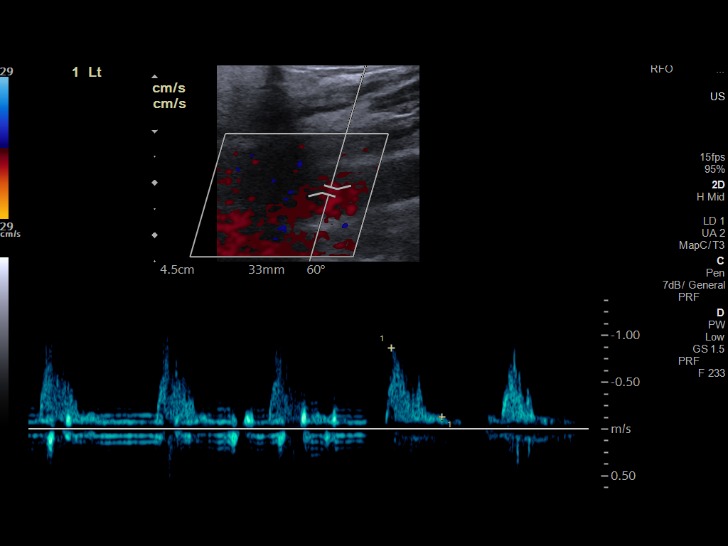
[im 81/89]
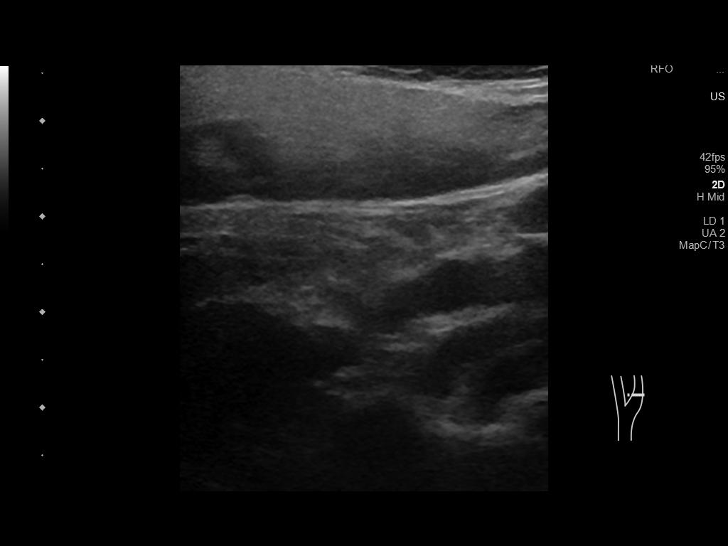
[im 89/89]
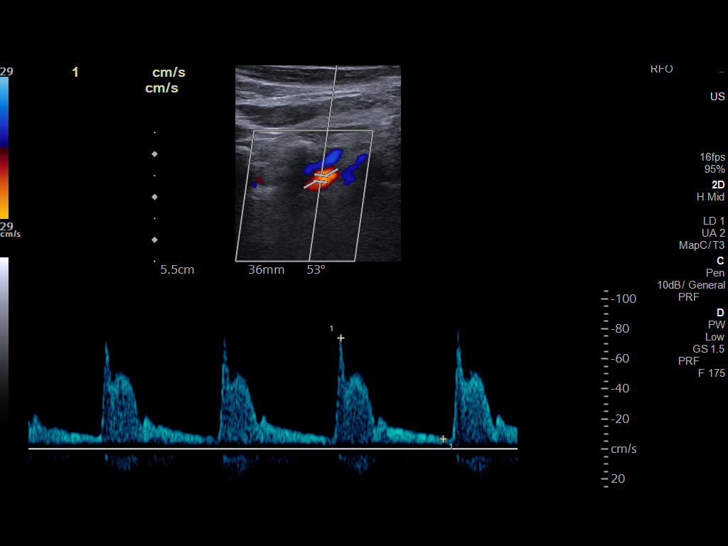

[13 of 24 positions shown; findings below may reference images not displayed]

FINDINGS: Criteria: Quantification of carotid stenosis is based on velocity
parameters that correlate the residual internal carotid diameter
with NASCET-based stenosis levels, using the diameter of the distal
internal carotid lumen as the denominator for stenosis measurement.

The following velocity measurements were obtained:

RIGHT

ICA: 77/13 cm/sec

CCA: 107/12 cm/sec

SYSTOLIC ICA/CCA RATIO:

ECA: 64 cm/sec

LEFT

ICA: 107/19 cm/sec

CCA: 128/12 cm/sec

SYSTOLIC ICA/CCA RATIO:

ECA: 87 cm/sec

RIGHT CAROTID ARTERY: Intimal thickening in the right common carotid
artery. Heterogeneous plaque at the right carotid bulb. Plaque at
the origin of the external carotid artery. External carotid artery
is patent with normal waveform. Heterogeneous plaque in the proximal
internal carotid artery. Normal waveforms and velocities in the
internal carotid artery.

RIGHT VERTEBRAL ARTERY: Antegrade flow and normal waveform in the
right vertebral artery.

LEFT CAROTID ARTERY: Intimal thickening in the left common carotid
artery. External carotid artery is patent with normal waveform.
Normal waveforms and velocities in the internal carotid artery.

LEFT VERTEBRAL ARTERY: Antegrade flow and normal waveform in the
left vertebral artery.
IMPRESSION: Atherosclerotic disease in the bilateral carotid arteries, right
side greater than left. Estimated degree of stenosis in the internal
carotid arteries is less than 50% bilaterally.

Patent vertebral arteries with antegrade flow.

## 2020-10-30 ENCOUNTER — Other Ambulatory Visit (HOSPITAL_COMMUNITY): Payer: Self-pay | Admitting: Family Medicine

## 2020-10-30 DIAGNOSIS — M25562 Pain in left knee: Secondary | ICD-10-CM

## 2020-11-02 ENCOUNTER — Other Ambulatory Visit (HOSPITAL_COMMUNITY): Payer: Self-pay | Admitting: Family Medicine

## 2020-11-02 DIAGNOSIS — Z1231 Encounter for screening mammogram for malignant neoplasm of breast: Secondary | ICD-10-CM

## 2020-11-13 ENCOUNTER — Ambulatory Visit (HOSPITAL_COMMUNITY)
Admission: RE | Admit: 2020-11-13 | Discharge: 2020-11-13 | Disposition: A | Discharge status: Home / Self Care | Payer: Medicare Other | Source: Ambulatory Visit | Attending: Family Medicine | Admitting: Family Medicine

## 2020-11-13 DIAGNOSIS — Z1231 Encounter for screening mammogram for malignant neoplasm of breast: Secondary | ICD-10-CM

## 2020-12-27 ENCOUNTER — Other Ambulatory Visit: Payer: Self-pay

## 2020-12-27 ENCOUNTER — Ambulatory Visit (HOSPITAL_COMMUNITY)
Admission: RE | Admit: 2020-12-27 | Discharge: 2020-12-27 | Disposition: A | Discharge status: Home / Self Care | Payer: Medicare Other | Source: Ambulatory Visit | Attending: Family Medicine | Admitting: Family Medicine

## 2020-12-27 DIAGNOSIS — M25562 Pain in left knee: Secondary | ICD-10-CM | POA: Diagnosis not present

## 2021-02-26 ENCOUNTER — Emergency Department (HOSPITAL_COMMUNITY)
Admission: EM | Admit: 2021-02-26 | Discharge: 2021-02-27 | Disposition: A | Discharge status: Home / Self Care | Payer: Medicare Other | Attending: Emergency Medicine | Admitting: Emergency Medicine

## 2021-02-26 ENCOUNTER — Emergency Department (HOSPITAL_COMMUNITY): Payer: Medicare Other

## 2021-02-26 ENCOUNTER — Encounter (HOSPITAL_COMMUNITY): Payer: Self-pay | Admitting: *Deleted

## 2021-02-26 ENCOUNTER — Other Ambulatory Visit: Payer: Self-pay

## 2021-02-26 DIAGNOSIS — Z87891 Personal history of nicotine dependence: Secondary | ICD-10-CM | POA: Diagnosis not present

## 2021-02-26 DIAGNOSIS — N390 Urinary tract infection, site not specified: Secondary | ICD-10-CM | POA: Diagnosis not present

## 2021-02-26 DIAGNOSIS — N838 Other noninflammatory disorders of ovary, fallopian tube and broad ligament: Secondary | ICD-10-CM | POA: Insufficient documentation

## 2021-02-26 DIAGNOSIS — Z96651 Presence of right artificial knee joint: Secondary | ICD-10-CM | POA: Insufficient documentation

## 2021-02-26 DIAGNOSIS — Z7902 Long term (current) use of antithrombotics/antiplatelets: Secondary | ICD-10-CM | POA: Insufficient documentation

## 2021-02-26 DIAGNOSIS — I129 Hypertensive chronic kidney disease with stage 1 through stage 4 chronic kidney disease, or unspecified chronic kidney disease: Secondary | ICD-10-CM | POA: Insufficient documentation

## 2021-02-26 DIAGNOSIS — I10 Essential (primary) hypertension: Secondary | ICD-10-CM

## 2021-02-26 DIAGNOSIS — R1084 Generalized abdominal pain: Secondary | ICD-10-CM

## 2021-02-26 DIAGNOSIS — N183 Chronic kidney disease, stage 3 unspecified: Secondary | ICD-10-CM | POA: Diagnosis not present

## 2021-02-26 DIAGNOSIS — Z79899 Other long term (current) drug therapy: Secondary | ICD-10-CM | POA: Diagnosis not present

## 2021-02-26 DIAGNOSIS — N189 Chronic kidney disease, unspecified: Secondary | ICD-10-CM

## 2021-02-26 LAB — CBC WITH DIFFERENTIAL/PLATELET
Abs Immature Granulocytes: 0.03 10*3/uL (ref 0.00–0.07)
Basophils Absolute: 0 10*3/uL (ref 0.0–0.1)
Basophils Relative: 0 %
Eosinophils Absolute: 0.1 10*3/uL (ref 0.0–0.5)
Eosinophils Relative: 1 %
HCT: 36.8 % (ref 36.0–46.0)
Hemoglobin: 11.8 g/dL — ABNORMAL LOW (ref 12.0–15.0)
Immature Granulocytes: 0 %
Lymphocytes Relative: 16 %
Lymphs Abs: 1.4 10*3/uL (ref 0.7–4.0)
MCH: 27.4 pg (ref 26.0–34.0)
MCHC: 32.1 g/dL (ref 30.0–36.0)
MCV: 85.4 fL (ref 80.0–100.0)
Monocytes Absolute: 1 10*3/uL (ref 0.1–1.0)
Monocytes Relative: 11 %
Neutro Abs: 6.5 10*3/uL (ref 1.7–7.7)
Neutrophils Relative %: 72 %
Platelets: 280 10*3/uL (ref 150–400)
RBC: 4.31 MIL/uL (ref 3.87–5.11)
RDW: 14.4 % (ref 11.5–15.5)
WBC: 9 10*3/uL (ref 4.0–10.5)
nRBC: 0 % (ref 0.0–0.2)

## 2021-02-26 LAB — URINALYSIS, ROUTINE W REFLEX MICROSCOPIC
Bilirubin Urine: NEGATIVE
Glucose, UA: NEGATIVE mg/dL
Ketones, ur: NEGATIVE mg/dL
Nitrite: POSITIVE — AB
Protein, ur: 100 mg/dL — AB
Specific Gravity, Urine: 1.015 (ref 1.005–1.030)
pH: 5 (ref 5.0–8.0)

## 2021-02-26 LAB — COMPREHENSIVE METABOLIC PANEL
ALT: 13 U/L (ref 0–44)
AST: 18 U/L (ref 15–41)
Albumin: 3.9 g/dL (ref 3.5–5.0)
Alkaline Phosphatase: 93 U/L (ref 38–126)
Anion gap: 8 (ref 5–15)
BUN: 25 mg/dL — ABNORMAL HIGH (ref 8–23)
CO2: 23 mmol/L (ref 22–32)
Calcium: 8.7 mg/dL — ABNORMAL LOW (ref 8.9–10.3)
Chloride: 105 mmol/L (ref 98–111)
Creatinine, Ser: 1.53 mg/dL — ABNORMAL HIGH (ref 0.44–1.00)
GFR, Estimated: 38 mL/min — ABNORMAL LOW (ref >60)
Glucose, Bld: 106 mg/dL — ABNORMAL HIGH (ref 70–99)
Potassium: 3.6 mmol/L (ref 3.5–5.1)
Sodium: 136 mmol/L (ref 135–145)
Total Bilirubin: 0.8 mg/dL (ref 0.3–1.2)
Total Protein: 7.7 g/dL (ref 6.5–8.1)

## 2021-02-26 LAB — LIPASE, BLOOD: Lipase: 25 U/L (ref 11–51)

## 2021-02-26 MED ORDER — HYDROMORPHONE HCL 1 MG/ML IJ SOLN
1.0000 mg | Freq: Once | INTRAMUSCULAR | Status: AC
  Administered 2021-02-26: 1 mg via INTRAVENOUS
  Filled 2021-02-26: qty 1

## 2021-02-26 MED ORDER — CEPHALEXIN 500 MG PO CAPS: 500.0000 mg | ORAL_CAPSULE | Freq: Three times a day (TID) | ORAL | 0 refills | Status: DC

## 2021-02-26 MED ORDER — SODIUM CHLORIDE 0.9 % IV BOLUS
1000.0000 mL | Freq: Once | INTRAVENOUS | Status: AC
  Administered 2021-02-26: 1000 mL via INTRAVENOUS

## 2021-02-26 MED ORDER — LABETALOL HCL 200 MG PO TABS
300.0000 mg | ORAL_TABLET | Freq: Once | ORAL | Status: AC
  Administered 2021-02-26: 300 mg via ORAL
  Filled 2021-02-26: qty 2

## 2021-02-26 MED ORDER — ONDANSETRON HCL 4 MG/2ML IJ SOLN
4.0000 mg | Freq: Once | INTRAMUSCULAR | Status: AC
  Administered 2021-02-26: 4 mg via INTRAVENOUS
  Filled 2021-02-26: qty 2

## 2021-02-26 MED ORDER — SODIUM CHLORIDE 0.9 % IV SOLN
1.0000 g | Freq: Once | INTRAVENOUS | Status: AC
  Administered 2021-02-26: 1 g via INTRAVENOUS
  Filled 2021-02-26: qty 10

## 2021-02-26 MED ORDER — MORPHINE SULFATE (PF) 4 MG/ML IV SOLN
4.0000 mg | Freq: Once | INTRAVENOUS | Status: AC
  Administered 2021-02-26: 4 mg via INTRAVENOUS
  Filled 2021-02-26: qty 1

## 2021-02-26 NOTE — Discharge Instructions (Addendum)
Your CT scan showed a cyst on the right side, next to your uterus.  Radiologist recommends you have an ultrasound as an outpatient to further evaluate it.  Return if you start running a high fever, start vomiting, or if pain is not being adequately controlled at home.

## 2021-02-26 NOTE — ED Notes (Signed)
Patient given a urine specimen cup with instructions on collection of same. States she can't urinate at this time as she urinated while in the waiting room and has not had anything to drink all day.

## 2021-02-26 NOTE — ED Provider Notes (Signed)
Care assumed from Dr. Melina Copa, patient with UTI and generalized abdominal pain pending CT of abdomen and pelvis.  Also, patient hypertensive and received labetalol.  Blood pressure has come down although still elevated systolic.  CT scan shows no acute process, but incidental finding of complex right adnexal cyst with recommendation to get outpatient ultrasound.  This is explained to the patient.  She is discharged with prescription for cephalexin and is to follow-up with her PCP.  Results for orders placed or performed during the hospital encounter of 02/26/21  CBC with Differential  Result Value Ref Range   WBC 9.0 4.0 - 10.5 K/uL   RBC 4.31 3.87 - 5.11 MIL/uL   Hemoglobin 11.8 (L) 12.0 - 15.0 g/dL   HCT 36.8 36.0 - 46.0 %   MCV 85.4 80.0 - 100.0 fL   MCH 27.4 26.0 - 34.0 pg   MCHC 32.1 30.0 - 36.0 g/dL   RDW 14.4 11.5 - 15.5 %   Platelets 280 150 - 400 K/uL   nRBC 0.0 0.0 - 0.2 %   Neutrophils Relative % 72 %   Neutro Abs 6.5 1.7 - 7.7 K/uL   Lymphocytes Relative 16 %   Lymphs Abs 1.4 0.7 - 4.0 K/uL   Monocytes Relative 11 %   Monocytes Absolute 1.0 0.1 - 1.0 K/uL   Eosinophils Relative 1 %   Eosinophils Absolute 0.1 0.0 - 0.5 K/uL   Basophils Relative 0 %   Basophils Absolute 0.0 0.0 - 0.1 K/uL   Immature Granulocytes 0 %   Abs Immature Granulocytes 0.03 0.00 - 0.07 K/uL  Comprehensive metabolic panel  Result Value Ref Range   Sodium 136 135 - 145 mmol/L   Potassium 3.6 3.5 - 5.1 mmol/L   Chloride 105 98 - 111 mmol/L   CO2 23 22 - 32 mmol/L   Glucose, Bld 106 (H) 70 - 99 mg/dL   BUN 25 (H) 8 - 23 mg/dL   Creatinine, Ser 1.53 (H) 0.44 - 1.00 mg/dL   Calcium 8.7 (L) 8.9 - 10.3 mg/dL   Total Protein 7.7 6.5 - 8.1 g/dL   Albumin 3.9 3.5 - 5.0 g/dL   AST 18 15 - 41 U/L   ALT 13 0 - 44 U/L   Alkaline Phosphatase 93 38 - 126 U/L   Total Bilirubin 0.8 0.3 - 1.2 mg/dL   GFR, Estimated 38 (L) >60 mL/min   Anion gap 8 5 - 15  Lipase, blood  Result Value Ref Range   Lipase 25  11 - 51 U/L  Urinalysis, Routine w reflex microscopic  Result Value Ref Range   Color, Urine YELLOW YELLOW   APPearance CLEAR CLEAR   Specific Gravity, Urine 1.015 1.005 - 1.030   pH 5.0 5.0 - 8.0   Glucose, UA NEGATIVE NEGATIVE mg/dL   Hgb urine dipstick SMALL (A) NEGATIVE   Bilirubin Urine NEGATIVE NEGATIVE   Ketones, ur NEGATIVE NEGATIVE mg/dL   Protein, ur 100 (A) NEGATIVE mg/dL   Nitrite POSITIVE (A) NEGATIVE   Leukocytes,Ua TRACE (A) NEGATIVE   RBC / HPF 0-5 0 - 5 RBC/hpf   WBC, UA 21-50 0 - 5 WBC/hpf   Bacteria, UA MANY (A) NONE SEEN   Squamous Epithelial / LPF 0-5 0 - 5   CT Abdomen Pelvis Wo Contrast  Addendum Date: 02/27/2021   ADDENDUM REPORT: 02/27/2021 00:01 ADDENDUM: Additional impression point: 2.2 cm minimally complex cyst right adnexa, incompletely characterized on this unenhanced CT. Consider outpatient, nonemergent pelvic ultrasound for further  assessment. Ordering provider will be notified of the addendum by the Radiologist Assistant, and communication documented in the PACS or [employer]. Electronically Signed   By: Lovena Le M.D.   On: 02/27/2021 00:01   Result Date: 02/27/2021 CLINICAL DATA:  Abdominal pain EXAM: CT ABDOMEN AND PELVIS WITHOUT CONTRAST TECHNIQUE: Multidetector CT imaging of the abdomen and pelvis was performed following the standard protocol without IV contrast. COMPARISON:  MR thoracolumbar spine 02/08/2019, renal ultrasound 07/30/2019 FINDINGS: Lower chest: Atelectatic changes otherwise clear bases. Borderline cardiomegaly. Coronary artery atherosclerosis. Distal thoracic aortic atherosclerosis. Some retained high attenuation enteric media within thoracic esophagus. Hepatobiliary: No visible focal liver lesion. Smooth surface contour. Normal hepatic attenuation. Prior cholecystectomy. Common bile duct measures up to 12 mm, slightly greater than expected for post cholecystectomy reservoir effect. No visible calcified gallstones. Pancreas: No  pancreatic ductal dilatation or surrounding inflammatory changes. Spleen: Normal in size. No concerning splenic lesions. Adrenals/Urinary Tract: Normal adrenal glands. No visible or contour deforming renal lesion. Scattered vascular calcifications in both kidneys with additional renal sinus calcifications, either additional vascular calcium or nonobstructing calculi. No obstructive urolithiasis or hydronephrosis is seen at this time. Urinary bladder is unremarkable given the degree of distention. Stomach/Bowel: The stomach and duodenum are unremarkable. Normal duodenal sweep across the midline abdomen. No small bowel thickening or dilatation. No colonic dilatation or wall thickening. The appendix is surgically absent. No evidence of bowel obstruction. Vascular/Lymphatic: Limited assessment absence of contrast media. Atherosclerotic calcifications within the abdominal aorta and branch vessels. No aneurysm or ectasia. No enlarged abdominopelvic lymph nodes. Reproductive: Uterus is surgically absent. Minimally complex appearing 2.2 cm cyst in the right adnexa, incompletely assessed on this unenhanced CT. Other: No abdominopelvic free fluid or free gas. No bowel containing hernias. Postsurgical changes of the anterior abdominal. Additional postsurgical changes posteriorly related to prior spinal fusion. Musculoskeletal: Pagetoid changes of the T12 vertebrae sclerotic features. Postsurgical changes from prior L3-4 posterior fusion and interbody spacer and lateral plate and screw fixation. Remote posterior screw tracks through the L5 and S1 levels as well with anterior interbody fusions L4-5 and L5-S1 with bony incorporation. No acute hardware complication or failure is seen at this time. Diffuse degenerative changes at non instrumented levels. Additional degenerative features in the bilateral SI joints, hips and symphysis pubis. No acute osseous abnormality or suspicious osseous lesion. IMPRESSION: 1. No clear acute CT  abnormality to provide a cause for patient's symptoms and elevated creatinine and. 2. Numerous punctate nonobstructing nephroliths versus renal sinus vascular calcification. No obstructing urolithiasis or hydronephrosis. 3. Small amount of high attenuation material retained in the distal thoracic esophagus, possibly related to reflux, correlate with clinical symptoms. 4. 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 5. Extensive postsurgical changes from prior L3-S1 fusions and revisions. No acute complication is present at this time. 6.  Aortic Atherosclerosis (ICD10-I70.0). 7. Cardiomegaly and coronary atherosclerosis. 8. Prior hysterectomy, cholecystectomy, appendectomy. Electronically Signed: By: Lovena Le M.D. On: [id number]      Delora Fuel, [place] [id number]

## 2021-02-26 NOTE — ED Triage Notes (Signed)
Abdominal pain onset this am, denies vomiting
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
## 2021-03-01 LAB — URINE CULTURE: Culture: >=100000 — AB

## 2021-03-02 ENCOUNTER — Telehealth: Payer: Self-pay | Admitting: *Deleted

## 2021-03-02 NOTE — Telephone Encounter (Signed)
Post ED Visit - Positive Culture Follow-up  Culture report reviewed by antimicrobial stewardship pharmacist: [place] Team '[]'$  Elenor Quinones, Pharm.D. '[]'$  Heide Guile, Pharm.D., BCPS AQ-ID '[]'$  Parks Neptune, Pharm.D., BCPS '[]'$  Alycia Rossetti, Pharm.D., BCPS '[]'$  [place], Pharm.D., BCPS, AAHIVP '[]'$  Legrand Como, Pharm.D., BCPS, AAHIVP '[]'$  Salome Arnt, PharmD, BCPS '[]'$  Johnnette Gourd, PharmD, BCPS '[]'$  Hughes Better, PharmD, BCPS '[]'$  Leeroy Cha, PharmD '[]'$  Laqueta Linden, PharmD, BCPS '[]'$  Albertina Parr, PharmD  [place] Team '[]'$  Leodis Sias, PharmD '[]'$  Lindell Spar, PharmD '[]'$  Royetta Asal, PharmD '[]'$  Graylin Shiver, Rph '[]'$  Rema Fendt) Glennon Mac, PharmD '[]'$  Arlyn Dunning, PharmD '[]'$  Netta Cedars, PharmD '[]'$  Dia Sitter, PharmD '[]'$  Leone Haven, PharmD '[]'$  Gretta Arab, PharmD '[]'$  Theodis Shove, PharmD '[]'$  Peggyann Juba, PharmD '[]'$  Reuel Boom, PharmD   Positive urine culture Treated with Cephalexin, organism sensitive to the same and no further patient follow-up is required at this time.  Joetta Manners, PharmD  Harlon Flor Talley 03/02/2021, 9:05 AM

## 2021-03-05 ENCOUNTER — Other Ambulatory Visit: Payer: Self-pay | Admitting: Internal Medicine

## 2021-03-05 DIAGNOSIS — Z87891 Personal history of nicotine dependence: Secondary | ICD-10-CM

## 2021-03-07 ENCOUNTER — Ambulatory Visit: Payer: Self-pay | Admitting: Orthopedic Surgery
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
## 2021-03-07 NOTE — Progress Notes (Signed)
Sent message, via epic in basket, requesting orders in epic from surgeon.  
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
## 2021-03-14 NOTE — Progress Notes (Signed)
DUE TO COVID-19 ONLY ONE VISITOR IS ALLOWED TO COME WITH YOU AND STAY IN THE WAITING ROOM ONLY DURING PRE OP AND PROCEDURE DAY OF SURGERY.  2 VISITOR  MAY VISIT WITH YOU AFTER SURGERY IN YOUR PRIVATE ROOM DURING VISITING HOURS ONLY!  YOU NEED TO HAVE A COVID 19 TEST ON__8/22/2022 ____'@_'$  '@_from'$  8am-3pm _____, THIS TEST MUST BE DONE BEFORE SURGERY,  Covid test is done at [place], [name] [address].  This is a drive thru.  No appt required. Please see map.                 Your procedure is scheduled on:  03/28/21   Report to [facility] Main  Entrance   Report to admitting at      [phone]     Call this number if you have problems the morning of surgery [phone]    REMEMBER: NO  SOLID FOOD CANDY OR GUM AFTER MIDNIGHT. CLEAR LIQUIDS UNTIL  0430am        . NOTHING BY MOUTH EXCEPT CLEAR LIQUIDS UNTIL 0430am    . PLEASE FINISH ENSURE DRINK PER SURGEON ORDER  WHICH NEEDS TO BE COMPLETED AT  0430 am     .      CLEAR LIQUID DIET   Foods Allowed                                                                    Coffee and tea, regular and decaf                            Fruit ices (not with fruit pulp)                                      Iced Popsicles                                    Carbonated beverages, regular and diet                                    Cranberry, grape and apple juices Sports drinks like Gatorade Lightly seasoned clear broth or consume(fat free) Sugar, honey syrup ___________________________________________________________________      BRUSH YOUR TEETH MORNING OF SURGERY AND RINSE YOUR MOUTH OUT, NO CHEWING GUM CANDY OR MINTS.     Take these medicines the morning of surgery with A SIP OF WATER:  Amlodipine, gabapentin, hydralazine, labetalol  DO NOT TAKE ANY DIABETIC MEDICATIONS DAY OF YOUR SURGERY                               You may not have any metal on your body including hair pins and              piercings  Do not wear  jewelry, make-up, lotions, powders or perfumes, deodorant  Do not wear nail polish on your fingernails.  Do not shave  48 hours prior to surgery.              Men may shave face and neck.   Do not bring valuables to the hospital. [place].  Contacts, dentures or bridgework may not be worn into surgery.  Leave suitcase in the car. After surgery it may be brought to your room.     Patients discharged the day of surgery will not be allowed to drive home. IF YOU ARE HAVING SURGERY AND GOING HOME THE SAME DAY, YOU MUST HAVE AN ADULT TO DRIVE YOU HOME AND BE WITH YOU FOR 24 HOURS. YOU MAY GO HOME BY TAXI OR UBER OR ORTHERWISE, BUT AN ADULT MUST ACCOMPANY YOU HOME AND STAY WITH YOU FOR 24 HOURS.  Name and phone number of your driver:  Special Instructions: N/A              Please read over the following fact sheets you were given: _____________________________________________________________________  [facility] - Preparing for Surgery Before surgery, you can play an important role.  Because skin is not sterile, your skin needs to be as free of germs as possible.  You can reduce the number of germs on your skin by washing with CHG (chlorahexidine gluconate) soap before surgery.  CHG is an antiseptic cleaner which kills germs and bonds with the skin to continue killing germs even after washing. Please DO NOT use if you have an allergy to CHG or antibacterial soaps.  If your skin becomes reddened/irritated stop using the CHG and inform your nurse when you arrive at Short Stay. Do not shave (including legs and underarms) for at least 48 hours prior to the first CHG shower.  You may shave your face/neck. Please follow these instructions carefully:  1.  Shower with CHG Soap the night before surgery and the  morning of Surgery.  2.  If you choose to wash your hair, wash your hair first as usual with your  normal  shampoo.  3.  After you shampoo,  rinse your hair and body thoroughly to remove the  shampoo.                           4.  Use CHG as you would any other liquid soap.  You can apply chg directly  to the skin and wash                       Gently with a scrungie or clean washcloth.  5.  Apply the CHG Soap to your body ONLY FROM THE NECK DOWN.   Do not use on face/ open                           Wound or open sores. Avoid contact with eyes, ears mouth and genitals (private parts).                       Wash face,  Genitals (private parts) with your normal soap.             6.  Wash thoroughly, paying special attention to the area where your surgery  will be performed.  7.  Thoroughly rinse your body with  warm water from the neck down.  8.  DO NOT shower/wash with your normal soap after using and rinsing off  the CHG Soap.                9.  Pat yourself dry with a clean towel.            10.  Wear clean pajamas.            11.  Place clean sheets on your bed the night of your first shower and do not  sleep with pets. Day of Surgery : Do not apply any lotions/deodorants the morning of surgery.  Please wear clean clothes to the hospital/surgery center.  FAILURE TO FOLLOW THESE INSTRUCTIONS MAY RESULT IN THE CANCELLATION OF YOUR SURGERY PATIENT SIGNATURE_________________________________  NURSE SIGNATURE__________________________________  ________________________________________________________________________

## 2021-03-15 ENCOUNTER — Encounter (HOSPITAL_COMMUNITY): Payer: Self-pay

## 2021-03-15 ENCOUNTER — Other Ambulatory Visit: Payer: Self-pay

## 2021-03-15 ENCOUNTER — Encounter (HOSPITAL_COMMUNITY)
Admission: RE | Admit: 2021-03-15 | Discharge: 2021-03-15 | Disposition: A | Discharge status: Home / Self Care | Payer: Medicare Other | Source: Ambulatory Visit | Attending: Specialist | Admitting: Specialist

## 2021-03-15 DIAGNOSIS — Z01812 Encounter for preprocedural laboratory examination: Secondary | ICD-10-CM | POA: Diagnosis present

## 2021-03-15 HISTORY — DX: Gastro-esophageal reflux disease without esophagitis: K21.9

## 2021-03-15 LAB — CBC
HCT: 33.5 % — ABNORMAL LOW (ref 36.0–46.0)
Hemoglobin: 10.2 g/dL — ABNORMAL LOW (ref 12.0–15.0)
MCH: 26.4 pg (ref 26.0–34.0)
MCHC: 30.4 g/dL (ref 30.0–36.0)
MCV: 86.6 fL (ref 80.0–100.0)
Platelets: 273 10*3/uL (ref 150–400)
RBC: 3.87 MIL/uL (ref 3.87–5.11)
RDW: 14.7 % (ref 11.5–15.5)
WBC: 6.5 10*3/uL (ref 4.0–10.5)
nRBC: 0 % (ref 0.0–0.2)

## 2021-03-15 LAB — SURGICAL PCR SCREEN
MRSA, PCR: NEGATIVE
Staphylococcus aureus: NEGATIVE

## 2021-03-15 LAB — BASIC METABOLIC PANEL
Anion gap: 5 (ref 5–15)
BUN: 33 mg/dL — ABNORMAL HIGH (ref 8–23)
CO2: 24 mmol/L (ref 22–32)
Calcium: 8.9 mg/dL (ref 8.9–10.3)
Chloride: 114 mmol/L — ABNORMAL HIGH (ref 98–111)
Creatinine, Ser: 2.06 mg/dL — ABNORMAL HIGH (ref 0.44–1.00)
GFR, Estimated: 26 mL/min — ABNORMAL LOW (ref >60)
Glucose, Bld: 90 mg/dL (ref 70–99)
Potassium: 4.5 mmol/L (ref 3.5–5.1)
Sodium: 143 mmol/L (ref 135–145)

## 2021-03-15 NOTE — Progress Notes (Signed)
PT had preop appt on 03/15/21.  Blood pressure in left upper arm was 197/79 and in left lower arm was 197/76.  In right upper arm blood pressure was 244/76.  Pt denied any chest pain , shortness of breath, dizziness or headache.  PT states she was in pain and had not yet taken blood pressure meds at time of preop appt.  PT took blood pressure meds at preop.  Pt stated she had f/u with Dr Sandi Mariscal on 03/16/2021.  PT reports her blood pressure has been elevated at home when she is in pain.  PT scheduled for surgery on 03/28/2021 havin left total knee with DR Tonita Cong.

## 2021-03-15 NOTE — Progress Notes (Addendum)
Anesthesia Review:  PCP: DR Sandi Mariscal  PT stated she had to see for clearance.  Called and [facility] for [facility] and requested clearance. Clearance on chart with no date.  Called and left message at [facility] for call back to request office visit note from 03/16/21.  Attempted x 2  Cardiologist : none  Chest x-ray : EKG :02/26/21  Echo : 2020  Carotids- 2020  Stress test: Cardiac Cath :  Activity level: cannot do a flight of stiars without difficlty  Sleep Study/ CPAP : none  Fasting Blood Sugar :      / Checks Blood Sugar -- times a day:   Blood Thinner/ Instructions /Last Dose: ASA / Instructions/ Last Dose :   Not on Plavix in over a year per pt  HX of stroke- weakness slight in left hand  Blood pressure at preop was 197/79 in left upper arm, in right- 244/78 upper arm and 197/76 in left lower arm.  Pt denies any chestpain,  dizzines or headache.  PT reports has not had blood pressure meds this am and blood pressure gets this high when I am in pain with my knee.  Reported to Konrad Felix, [facility].  Pt reports having blood pressure meds with her.  PT took blood pressure meds at preop  with water given.  PT has appat with PCP on 03/16/21.  Cbc  and BMP done 03/15/21 routed to Dr Tonita Cong. Progress note with above info regarding blood pressure routed to DR Sandi Mariscal.

## 2021-03-23 ENCOUNTER — Other Ambulatory Visit: Payer: Self-pay

## 2021-03-23 ENCOUNTER — Ambulatory Visit
Admission: RE | Admit: 2021-03-23 | Discharge: 2021-03-23 | Disposition: A | Discharge status: Home / Self Care | Payer: Medicare Other | Source: Ambulatory Visit | Attending: Internal Medicine | Admitting: Internal Medicine

## 2021-03-23 DIAGNOSIS — Z87891 Personal history of nicotine dependence: Secondary | ICD-10-CM
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
## 2021-03-23 NOTE — Anesthesia Preprocedure Evaluation (Addendum)
Anesthesia Evaluation  Patient identified by MRN, date of birth, ID band Patient awake    Reviewed: Allergy & Precautions, NPO status , Patient's Chart, lab work & pertinent test results, reviewed documented beta blocker date and time   History of Anesthesia Complications Negative for: history of anesthetic complications  Airway Mallampati: II  TM Distance: >3 FB Neck ROM: Full    Dental  (+) Missing, Poor Dentition,    Pulmonary Current Smoker and Patient abstained from smoking.,    Pulmonary exam normal        Cardiovascular hypertension, Pt. on medications and Pt. on home beta blockers Normal cardiovascular exam  Echo with bubble study 02/13/19: EF >65%, valves ok    Neuro/Psych Depression S/p lumbar fusion  CVA (1981, 1982, 1983, 2020), No Residual Symptoms    GI/Hepatic Neg liver ROS, GERD  Controlled,  Endo/Other  negative endocrine ROS  Renal/GU Renal InsufficiencyRenal disease (Cr 2.06, K 4.5)  negative genitourinary   Musculoskeletal  (+) Arthritis  (on chronic opioids), Osteoarthritis,    Abdominal   Peds  Hematology  (+) anemia , Hgb 10.2, plts 273k   Anesthesia Other Findings Day of surgery medications reviewed with patient.  Reproductive/Obstetrics negative OB ROS                          Anesthesia Physical Anesthesia Plan  ASA: 3  Anesthesia Plan: General   Post-op Pain Management:  Regional for Post-op pain   Induction: Intravenous  PONV Risk Score and Plan: 3 and Treatment may vary due to age or medical condition, Ondansetron, Dexamethasone and Midazolam  Airway Management Planned: LMA  Additional Equipment: None  Intra-op Plan:   Post-operative Plan: Extubation in OR  Informed Consent: I have reviewed the patients History and Physical, chart, labs and discussed the procedure including the risks, benefits and alternatives for the proposed anesthesia with the  patient or authorized representative who has indicated his/her understanding and acceptance.     Dental advisory given  Plan Discussed with: CRNA  Anesthesia Plan Comments: (Discussed R/B/A of spinal vs GA. Patient prefers GA. Daiva Huge, MD)      Anesthesia Quick Evaluation

## 2021-03-26 ENCOUNTER — Other Ambulatory Visit: Payer: Self-pay | Admitting: Specialist

## 2021-03-26 LAB — SARS CORONAVIRUS 2 (TAT 6-24 HRS): SARS Coronavirus 2: NEGATIVE

## 2021-03-28 ENCOUNTER — Ambulatory Visit (HOSPITAL_COMMUNITY): Payer: Medicare Other

## 2021-03-28 ENCOUNTER — Encounter (HOSPITAL_COMMUNITY)
Admission: RE | Disposition: A | Discharge status: Home / Self Care | Payer: Self-pay | Source: Home / Self Care | Attending: Specialist

## 2021-03-28 ENCOUNTER — Ambulatory Visit (HOSPITAL_COMMUNITY): Payer: Medicare Other | Admitting: Anesthesiology

## 2021-03-28 ENCOUNTER — Ambulatory Visit (HOSPITAL_COMMUNITY): Payer: Medicare Other | Admitting: Emergency Medicine

## 2021-03-28 ENCOUNTER — Other Ambulatory Visit: Payer: Self-pay

## 2021-03-28 ENCOUNTER — Encounter (HOSPITAL_COMMUNITY): Payer: Self-pay | Admitting: Specialist

## 2021-03-28 ENCOUNTER — Observation Stay (HOSPITAL_COMMUNITY)
Admission: RE | Admit: 2021-03-28 | Discharge: 2021-03-31 | Disposition: A | Discharge status: Home / Self Care | Payer: Medicare Other | Attending: Specialist | Admitting: Specialist

## 2021-03-28 DIAGNOSIS — I10 Essential (primary) hypertension: Secondary | ICD-10-CM | POA: Diagnosis not present

## 2021-03-28 DIAGNOSIS — Z87891 Personal history of nicotine dependence: Secondary | ICD-10-CM | POA: Diagnosis not present

## 2021-03-28 DIAGNOSIS — M1712 Unilateral primary osteoarthritis, left knee: Principal | ICD-10-CM | POA: Diagnosis present

## 2021-03-28 DIAGNOSIS — Z96652 Presence of left artificial knee joint: Secondary | ICD-10-CM | POA: Insufficient documentation

## 2021-03-28 DIAGNOSIS — Z8673 Personal history of transient ischemic attack (TIA), and cerebral infarction without residual deficits: Secondary | ICD-10-CM

## 2021-03-28 DIAGNOSIS — N183 Chronic kidney disease, stage 3 unspecified: Secondary | ICD-10-CM | POA: Diagnosis present

## 2021-03-28 DIAGNOSIS — Z79899 Other long term (current) drug therapy: Secondary | ICD-10-CM | POA: Insufficient documentation

## 2021-03-28 DIAGNOSIS — E785 Hyperlipidemia, unspecified: Secondary | ICD-10-CM | POA: Diagnosis present

## 2021-03-28 DIAGNOSIS — Z96659 Presence of unspecified artificial knee joint: Secondary | ICD-10-CM

## 2021-03-28 HISTORY — PX: TOTAL KNEE ARTHROPLASTY: SHX125

## 2021-03-28 SURGERY — ARTHROPLASTY, KNEE, TOTAL
Anesthesia: General | Site: Knee | Laterality: Left

## 2021-03-28 MED ORDER — LACTATED RINGERS IV SOLN: INTRAVENOUS | Status: DC

## 2021-03-28 MED ORDER — HYDROMORPHONE HCL 1 MG/ML IJ SOLN
0.5000 mg | INTRAMUSCULAR | Status: DC | PRN
  Administered 2021-03-28 – 2021-03-30 (×3): 1 mg via INTRAVENOUS
  Filled 2021-03-28 (×3): qty 1

## 2021-03-28 MED ORDER — FENTANYL CITRATE (PF) 100 MCG/2ML IJ SOLN
INTRAMUSCULAR | Status: AC
  Filled 2021-03-28: qty 2

## 2021-03-28 MED ORDER — CLONIDINE HCL 0.2 MG/24HR TD PTWK
0.2000 mg | MEDICATED_PATCH | TRANSDERMAL | Status: DC
  Administered 2021-03-28: 0.2 mg via TRANSDERMAL
  Filled 2021-03-28: qty 1

## 2021-03-28 MED ORDER — MIDAZOLAM HCL 2 MG/2ML IJ SOLN
INTRAMUSCULAR | Status: AC
  Filled 2021-03-28: qty 2

## 2021-03-28 MED ORDER — HYDRALAZINE HCL 20 MG/ML IJ SOLN: 10.0000 mg | INTRAMUSCULAR | Status: DC | PRN

## 2021-03-28 MED ORDER — LABETALOL HCL 5 MG/ML IV SOLN
INTRAVENOUS | Status: AC
  Filled 2021-03-28: qty 4

## 2021-03-28 MED ORDER — STERILE WATER FOR IRRIGATION IR SOLN
Status: DC | PRN
  Administered 2021-03-28: 2000 mL

## 2021-03-28 MED ORDER — ACETAMINOPHEN 325 MG PO TABS
325.0000 mg | ORAL_TABLET | Freq: Four times a day (QID) | ORAL | Status: DC | PRN
  Administered 2021-03-29 – 2021-03-31 (×2): 650 mg via ORAL
  Filled 2021-03-28 (×2): qty 2

## 2021-03-28 MED ORDER — CLONIDINE HCL (ANALGESIA) 100 MCG/ML EP SOLN
EPIDURAL | Status: DC | PRN
  Administered 2021-03-28: 100 ug

## 2021-03-28 MED ORDER — CEFAZOLIN SODIUM-DEXTROSE 2-4 GM/100ML-% IV SOLN
2.0000 g | Freq: Four times a day (QID) | INTRAVENOUS | Status: AC
  Administered 2021-03-28 (×2): 2 g via INTRAVENOUS
  Filled 2021-03-28 (×2): qty 100

## 2021-03-28 MED ORDER — PHENYLEPHRINE 40 MCG/ML (10ML) SYRINGE FOR IV PUSH (FOR BLOOD PRESSURE SUPPORT)
PREFILLED_SYRINGE | INTRAVENOUS | Status: AC
  Filled 2021-03-28: qty 10

## 2021-03-28 MED ORDER — HYDROCHLOROTHIAZIDE 12.5 MG PO CAPS
12.5000 mg | ORAL_CAPSULE | Freq: Every day | ORAL | Status: DC
  Administered 2021-03-29 – 2021-03-30 (×2): 12.5 mg via ORAL
  Filled 2021-03-28 (×2): qty 1

## 2021-03-28 MED ORDER — EPHEDRINE SULFATE-NACL 50-0.9 MG/10ML-% IV SOSY
PREFILLED_SYRINGE | INTRAVENOUS | Status: DC | PRN
Start: 2021-03-28 — End: 2021-03-28
  Administered 2021-03-28 (×2): 5 mg via INTRAVENOUS

## 2021-03-28 MED ORDER — HYDRALAZINE HCL 20 MG/ML IJ SOLN
INTRAMUSCULAR | Status: AC
  Filled 2021-03-28: qty 1

## 2021-03-28 MED ORDER — BUPIVACAINE-EPINEPHRINE 0.25% -1:200000 IJ SOLN
INTRAMUSCULAR | Status: DC | PRN
  Administered 2021-03-28: 30 mL

## 2021-03-28 MED ORDER — SODIUM CHLORIDE 0.9 % IR SOLN
Status: DC | PRN
  Administered 2021-03-28: 1000 mL

## 2021-03-28 MED ORDER — DIPHENHYDRAMINE HCL 12.5 MG/5ML PO ELIX: 12.5000 mg | ORAL_SOLUTION | ORAL | Status: DC | PRN

## 2021-03-28 MED ORDER — FENTANYL CITRATE (PF) 100 MCG/2ML IJ SOLN: 25.0000 ug | INTRAMUSCULAR | Status: DC | PRN

## 2021-03-28 MED ORDER — ACETAMINOPHEN 10 MG/ML IV SOLN
1000.0000 mg | INTRAVENOUS | Status: AC
  Administered 2021-03-28: 1000 mg via INTRAVENOUS
  Filled 2021-03-28: qty 100

## 2021-03-28 MED ORDER — 0.9 % SODIUM CHLORIDE (POUR BTL) OPTIME
TOPICAL | Status: DC | PRN
  Administered 2021-03-28: 1000 mL

## 2021-03-28 MED ORDER — PROPOFOL 10 MG/ML IV BOLUS
INTRAVENOUS | Status: DC | PRN
  Administered 2021-03-28: 200 mg via INTRAVENOUS

## 2021-03-28 MED ORDER — DOCUSATE SODIUM 100 MG PO CAPS
100.0000 mg | ORAL_CAPSULE | Freq: Two times a day (BID) | ORAL | Status: DC
  Administered 2021-03-28 – 2021-03-31 (×6): 100 mg via ORAL
  Filled 2021-03-28 (×6): qty 1

## 2021-03-28 MED ORDER — ACETAMINOPHEN 500 MG PO TABS
1000.0000 mg | ORAL_TABLET | Freq: Four times a day (QID) | ORAL | Status: AC
  Administered 2021-03-28 – 2021-03-29 (×4): 1000 mg via ORAL
  Filled 2021-03-28 (×4): qty 2

## 2021-03-28 MED ORDER — APIXABAN 2.5 MG PO TABS
2.5000 mg | ORAL_TABLET | Freq: Two times a day (BID) | ORAL | Status: DC
  Administered 2021-03-29 – 2021-03-31 (×5): 2.5 mg via ORAL
  Filled 2021-03-28 (×5): qty 1

## 2021-03-28 MED ORDER — TRAZODONE HCL 50 MG PO TABS
50.0000 mg | ORAL_TABLET | Freq: Every day | ORAL | Status: DC
  Administered 2021-03-28 – 2021-03-30 (×3): 50 mg via ORAL
  Filled 2021-03-28 (×3): qty 1

## 2021-03-28 MED ORDER — AMLODIPINE BESYLATE 10 MG PO TABS: 10.0000 mg | ORAL_TABLET | Freq: Every day | ORAL | Status: DC

## 2021-03-28 MED ORDER — BUPIVACAINE LIPOSOME 1.3 % IJ SUSP
20.0000 mL | Freq: Once | INTRAMUSCULAR | Status: AC
  Administered 2021-03-28: 20 mL
  Filled 2021-03-28: qty 20

## 2021-03-28 MED ORDER — GABAPENTIN 300 MG PO CAPS
300.0000 mg | ORAL_CAPSULE | Freq: Two times a day (BID) | ORAL | Status: DC
  Administered 2021-03-28 – 2021-03-30 (×4): 300 mg via ORAL
  Filled 2021-03-28 (×4): qty 1

## 2021-03-28 MED ORDER — PHENYLEPHRINE 40 MCG/ML (10ML) SYRINGE FOR IV PUSH (FOR BLOOD PRESSURE SUPPORT)
PREFILLED_SYRINGE | INTRAVENOUS | Status: DC | PRN
  Administered 2021-03-28: 80 ug via INTRAVENOUS

## 2021-03-28 MED ORDER — HYDROMORPHONE HCL 1 MG/ML IJ SOLN
INTRAMUSCULAR | Status: DC | PRN
  Administered 2021-03-28 (×3): .5 mg via INTRAVENOUS

## 2021-03-28 MED ORDER — METHOCARBAMOL 500 MG IVPB - SIMPLE MED
500.0000 mg | Freq: Four times a day (QID) | INTRAVENOUS | Status: DC | PRN
  Filled 2021-03-28: qty 50

## 2021-03-28 MED ORDER — VITAMIN D 25 MCG (1000 UNIT) PO TABS
5000.0000 [IU] | ORAL_TABLET | Freq: Every day | ORAL | Status: DC
  Administered 2021-03-28 – 2021-03-31 (×4): 5000 [IU] via ORAL
  Filled 2021-03-28 (×4): qty 5

## 2021-03-28 MED ORDER — LISINOPRIL 20 MG PO TABS
20.0000 mg | ORAL_TABLET | Freq: Every day | ORAL | Status: DC
  Administered 2021-03-29 – 2021-03-30 (×2): 20 mg via ORAL
  Filled 2021-03-28 (×2): qty 1

## 2021-03-28 MED ORDER — TRANEXAMIC ACID-NACL 1000-0.7 MG/100ML-% IV SOLN
1000.0000 mg | INTRAVENOUS | Status: AC
  Administered 2021-03-28: 1000 mg via INTRAVENOUS
  Filled 2021-03-28: qty 100

## 2021-03-28 MED ORDER — OXYCODONE HCL 5 MG PO TABS
10.0000 mg | ORAL_TABLET | ORAL | Status: DC | PRN
  Administered 2021-03-28: 10 mg via ORAL
  Administered 2021-03-29 – 2021-03-31 (×8): 15 mg via ORAL
  Filled 2021-03-28 (×8): qty 3

## 2021-03-28 MED ORDER — LABETALOL HCL 5 MG/ML IV SOLN
10.0000 mg | Freq: Once | INTRAVENOUS | Status: AC | PRN
  Administered 2021-03-28: 10 mg via INTRAVENOUS

## 2021-03-28 MED ORDER — OXYCODONE HCL 5 MG PO TABS
5.0000 mg | ORAL_TABLET | ORAL | Status: DC | PRN
  Administered 2021-03-30: 10 mg via ORAL
  Filled 2021-03-28 (×2): qty 2

## 2021-03-28 MED ORDER — VITAMIN C 250 MG PO TABS
125.0000 mg | ORAL_TABLET | Freq: Every day | ORAL | Status: DC
  Administered 2021-03-28 – 2021-03-31 (×4): 125 mg via ORAL
  Filled 2021-03-28 (×4): qty 1

## 2021-03-28 MED ORDER — CHLORHEXIDINE GLUCONATE 0.12 % MT SOLN
15.0000 mL | Freq: Once | OROMUCOSAL | Status: AC
  Administered 2021-03-28: 15 mL via OROMUCOSAL

## 2021-03-28 MED ORDER — ALUM & MAG HYDROXIDE-SIMETH 200-200-20 MG/5ML PO SUSP: 30.0000 mL | ORAL | Status: DC | PRN

## 2021-03-28 MED ORDER — DEXAMETHASONE SODIUM PHOSPHATE 10 MG/ML IJ SOLN
INTRAMUSCULAR | Status: AC
  Filled 2021-03-28: qty 1

## 2021-03-28 MED ORDER — POLYETHYLENE GLYCOL 3350 17 G PO PACK
17.0000 g | PACK | Freq: Every day | ORAL | Status: DC
  Administered 2021-03-28 – 2021-03-31 (×4): 17 g via ORAL
  Filled 2021-03-28 (×4): qty 1

## 2021-03-28 MED ORDER — ONDANSETRON HCL 4 MG/2ML IJ SOLN
4.0000 mg | Freq: Four times a day (QID) | INTRAMUSCULAR | Status: DC | PRN
  Administered 2021-03-28: 4 mg via INTRAVENOUS
  Filled 2021-03-28: qty 2

## 2021-03-28 MED ORDER — TAB-A-VITE/IRON PO TABS: 1.0000 | ORAL_TABLET | Freq: Every day | ORAL | Status: AC

## 2021-03-28 MED ORDER — OXYCODONE HCL 5 MG/5ML PO SOLN: 5.0000 mg | Freq: Once | ORAL | Status: DC | PRN

## 2021-03-28 MED ORDER — LABETALOL HCL 300 MG PO TABS
300.0000 mg | ORAL_TABLET | Freq: Three times a day (TID) | ORAL | Status: DC
  Administered 2021-03-28 – 2021-03-31 (×9): 300 mg via ORAL
  Filled 2021-03-28 (×13): qty 1

## 2021-03-28 MED ORDER — LISINOPRIL-HYDROCHLOROTHIAZIDE 20-12.5 MG PO TABS: 1.0000 | ORAL_TABLET | ORAL | Status: DC

## 2021-03-28 MED ORDER — SODIUM CHLORIDE 0.9% FLUSH
INTRAVENOUS | Status: DC | PRN
  Administered 2021-03-28: 40 mL

## 2021-03-28 MED ORDER — ORAL CARE MOUTH RINSE: 15.0000 mL | Freq: Once | OROMUCOSAL | Status: AC

## 2021-03-28 MED ORDER — HYDRALAZINE HCL 20 MG/ML IJ SOLN
10.0000 mg | Freq: Once | INTRAMUSCULAR | Status: AC | PRN
  Administered 2021-03-28: 10 mg via INTRAVENOUS

## 2021-03-28 MED ORDER — BUPIVACAINE-EPINEPHRINE (PF) 0.25% -1:200000 IJ SOLN
INTRAMUSCULAR | Status: AC
  Filled 2021-03-28: qty 30

## 2021-03-28 MED ORDER — PROPOFOL 10 MG/ML IV BOLUS
INTRAVENOUS | Status: AC
  Filled 2021-03-28: qty 20

## 2021-03-28 MED ORDER — RISAQUAD PO CAPS
1.0000 | ORAL_CAPSULE | Freq: Every day | ORAL | Status: DC
  Administered 2021-03-28 – 2021-03-31 (×4): 1 via ORAL
  Filled 2021-03-28 (×4): qty 1

## 2021-03-28 MED ORDER — KCL IN DEXTROSE-NACL 10-5-0.45 MEQ/L-%-% IV SOLN
INTRAVENOUS | Status: DC
  Filled 2021-03-28 (×2): qty 1000

## 2021-03-28 MED ORDER — METOCLOPRAMIDE HCL 5 MG/ML IJ SOLN: 5.0000 mg | Freq: Three times a day (TID) | INTRAMUSCULAR | Status: DC | PRN

## 2021-03-28 MED ORDER — CEFAZOLIN SODIUM-DEXTROSE 2-4 GM/100ML-% IV SOLN
2.0000 g | INTRAVENOUS | Status: AC
  Administered 2021-03-28: 2 g via INTRAVENOUS
  Filled 2021-03-28: qty 100

## 2021-03-28 MED ORDER — OXYMETAZOLINE HCL 0.05 % NA SOLN
1.0000 | Freq: Two times a day (BID) | NASAL | Status: AC | PRN
  Filled 2021-03-28: qty 15

## 2021-03-28 MED ORDER — ACETAMINOPHEN 500 MG PO TABS: 1000.0000 mg | ORAL_TABLET | Freq: Once | ORAL | Status: DC

## 2021-03-28 MED ORDER — BUPIVACAINE-EPINEPHRINE (PF) 0.5% -1:200000 IJ SOLN
INTRAMUSCULAR | Status: DC | PRN
  Administered 2021-03-28: 15 mL via PERINEURAL

## 2021-03-28 MED ORDER — AMLODIPINE BESYLATE 10 MG PO TABS
10.0000 mg | ORAL_TABLET | Freq: Every day | ORAL | Status: DC
  Administered 2021-03-28 – 2021-03-31 (×4): 10 mg via ORAL
  Filled 2021-03-28 (×4): qty 1

## 2021-03-28 MED ORDER — HYDROMORPHONE HCL 2 MG/ML IJ SOLN
INTRAMUSCULAR | Status: AC
  Filled 2021-03-28: qty 1

## 2021-03-28 MED ORDER — OXYCODONE HCL 5 MG PO TABS: 5.0000 mg | ORAL_TABLET | Freq: Once | ORAL | Status: DC | PRN

## 2021-03-28 MED ORDER — METOCLOPRAMIDE HCL 5 MG PO TABS
5.0000 mg | ORAL_TABLET | Freq: Three times a day (TID) | ORAL | Status: DC | PRN
  Administered 2021-03-28: 10 mg via ORAL
  Filled 2021-03-28: qty 2

## 2021-03-28 MED ORDER — PROPOFOL 1000 MG/100ML IV EMUL
INTRAVENOUS | Status: AC
  Filled 2021-03-28: qty 100

## 2021-03-28 MED ORDER — LIDOCAINE 2% (20 MG/ML) 5 ML SYRINGE
INTRAMUSCULAR | Status: DC | PRN
  Administered 2021-03-28: 100 mg via INTRAVENOUS

## 2021-03-28 MED ORDER — HYDROMORPHONE HCL 2 MG PO TABS
2.0000 mg | ORAL_TABLET | ORAL | Status: DC | PRN
  Administered 2021-03-28 – 2021-03-29 (×3): 4 mg via ORAL
  Administered 2021-03-30: 2 mg via ORAL
  Administered 2021-03-30 (×3): 4 mg via ORAL
  Administered 2021-03-31: 2 mg via ORAL
  Administered 2021-03-31 (×2): 4 mg via ORAL
  Administered 2021-03-31: 2 mg via ORAL
  Filled 2021-03-28: qty 1
  Filled 2021-03-28 (×5): qty 2
  Filled 2021-03-28: qty 1
  Filled 2021-03-28 (×2): qty 2
  Filled 2021-03-28: qty 1
  Filled 2021-03-28: qty 2

## 2021-03-28 MED ORDER — BISACODYL 5 MG PO TBEC: 5.0000 mg | DELAYED_RELEASE_TABLET | Freq: Every day | ORAL | Status: DC | PRN

## 2021-03-28 MED ORDER — CLONIDINE HCL 0.2 MG/24HR TD PTWK: 0.2000 mg | MEDICATED_PATCH | TRANSDERMAL | Status: DC

## 2021-03-28 MED ORDER — PHENOL 1.4 % MT LIQD: 1.0000 | OROMUCOSAL | Status: DC | PRN

## 2021-03-28 MED ORDER — PROMETHAZINE HCL 25 MG/ML IJ SOLN: 6.2500 mg | INTRAMUSCULAR | Status: DC | PRN

## 2021-03-28 MED ORDER — DEXAMETHASONE SODIUM PHOSPHATE 10 MG/ML IJ SOLN
INTRAMUSCULAR | Status: DC | PRN
  Administered 2021-03-28: 10 mg via INTRAVENOUS

## 2021-03-28 MED ORDER — METHOCARBAMOL 500 MG PO TABS
500.0000 mg | ORAL_TABLET | Freq: Four times a day (QID) | ORAL | Status: DC | PRN
  Administered 2021-03-28 – 2021-03-31 (×9): 500 mg via ORAL
  Filled 2021-03-28 (×9): qty 1

## 2021-03-28 MED ORDER — FENTANYL CITRATE (PF) 100 MCG/2ML IJ SOLN
INTRAMUSCULAR | Status: DC | PRN
  Administered 2021-03-28: 50 ug via INTRAVENOUS

## 2021-03-28 MED ORDER — MENTHOL 3 MG MT LOZG: 1.0000 | LOZENGE | OROMUCOSAL | Status: DC | PRN

## 2021-03-28 MED ORDER — LABETALOL HCL 100 MG PO TABS: 300.0000 mg | ORAL_TABLET | Freq: Three times a day (TID) | ORAL | Status: DC

## 2021-03-28 MED ORDER — ONDANSETRON HCL 4 MG/2ML IJ SOLN
INTRAMUSCULAR | Status: DC | PRN
  Administered 2021-03-28: 4 mg via INTRAVENOUS

## 2021-03-28 MED ORDER — MIDAZOLAM HCL 5 MG/5ML IJ SOLN
INTRAMUSCULAR | Status: DC | PRN
  Administered 2021-03-28: 2 mg via INTRAVENOUS

## 2021-03-28 MED ORDER — EPHEDRINE 5 MG/ML INJ
INTRAVENOUS | Status: AC
  Filled 2021-03-28: qty 5

## 2021-03-28 MED ORDER — IRRISEPT - 450ML BOTTLE WITH 0.05% CHG IN STERILE WATER, USP 99.95% OPTIME
TOPICAL | Status: DC | PRN
  Administered 2021-03-28: 450 mL via TOPICAL

## 2021-03-28 MED ORDER — ONDANSETRON HCL 4 MG/2ML IJ SOLN
INTRAMUSCULAR | Status: AC
  Filled 2021-03-28: qty 2

## 2021-03-28 MED ORDER — ONDANSETRON HCL 4 MG PO TABS
4.0000 mg | ORAL_TABLET | Freq: Four times a day (QID) | ORAL | Status: DC | PRN
  Administered 2021-03-29: 4 mg via ORAL
  Filled 2021-03-28: qty 1

## 2021-03-28 MED ORDER — FENTANYL CITRATE (PF) 100 MCG/2ML IJ SOLN
INTRAMUSCULAR | Status: DC | PRN
  Administered 2021-03-28: 100 ug via INTRAVENOUS

## 2021-03-28 MED ORDER — GABAPENTIN 300 MG PO CAPS
600.0000 mg | ORAL_CAPSULE | Freq: Every day | ORAL | Status: DC
  Administered 2021-03-28 – 2021-03-30 (×3): 600 mg via ORAL
  Filled 2021-03-28 (×3): qty 2

## 2021-03-28 SURGICAL SUPPLY — 78 items
ATTUNE PS FEM LT SZ 5 CEM KNEE (Femur) ×2 IMPLANT
ATTUNE PSRP INSR SZ5 5 KNEE (Insert) ×2 IMPLANT
BAG COUNTER SPONGE SURGICOUNT (BAG) ×2 IMPLANT
BAG DECANTER FOR FLEXI CONT (MISCELLANEOUS) ×2 IMPLANT
BAG ZIPLOCK 12X15 (MISCELLANEOUS) IMPLANT
BASE TIBIAL ROT PLAT SZ 5 KNEE (Knees) ×1 IMPLANT
BLADE SAW SGTL 11.0X1.19X90.0M (BLADE) ×2 IMPLANT
BLADE SAW SGTL 13.0X1.19X90.0M (BLADE) ×2 IMPLANT
BLADE SURG SZ10 CARB STEEL (BLADE) ×4 IMPLANT
BNDG COHESIVE 4X5 TAN ST LF (GAUZE/BANDAGES/DRESSINGS) ×2 IMPLANT
BNDG ELASTIC 4X5.8 VLCR STR LF (GAUZE/BANDAGES/DRESSINGS) ×2 IMPLANT
BNDG ELASTIC 6X5.8 VLCR STR LF (GAUZE/BANDAGES/DRESSINGS) ×2 IMPLANT
CEMENT HV SMART SET (Cement) ×4 IMPLANT
COVER SURGICAL LIGHT HANDLE (MISCELLANEOUS) ×2 IMPLANT
CUFF TOURN SGL QUICK 34 (TOURNIQUET CUFF) ×1
CUFF TRNQT CYL 34X4.125X (TOURNIQUET CUFF) ×1 IMPLANT
DECANTER SPIKE VIAL GLASS SM (MISCELLANEOUS) ×2 IMPLANT
DRAPE INCISE IOBAN 66X45 STRL (DRAPES) ×2 IMPLANT
DRAPE ORTHO SPLIT 77X108 STRL (DRAPES) ×2
DRAPE SHEET LG 3/4 BI-LAMINATE (DRAPES) ×4 IMPLANT
DRAPE SURG ORHT 6 SPLT 77X108 (DRAPES) ×2 IMPLANT
DRAPE U-SHAPE 47X51 STRL (DRAPES) ×2 IMPLANT
DRSG AQUACEL AG ADV 3.5X10 (GAUZE/BANDAGES/DRESSINGS) ×2 IMPLANT
DRSG TEGADERM 4X4.75 (GAUZE/BANDAGES/DRESSINGS) IMPLANT
DURAPREP 26ML APPLICATOR (WOUND CARE) ×2 IMPLANT
ELECT BLADE TIP CTD 4 INCH (ELECTRODE) ×2 IMPLANT
ELECT REM PT RETURN 15FT ADLT (MISCELLANEOUS) ×2 IMPLANT
EVACUATOR 1/8 PVC DRAIN (DRAIN) IMPLANT
GAUZE SPONGE 2X2 8PLY STRL LF (GAUZE/BANDAGES/DRESSINGS) IMPLANT
GLOVE SRG 8 PF TXTR STRL LF DI (GLOVE) ×1 IMPLANT
GLOVE SURG POLYISO LF SZ7.5 (GLOVE) ×4 IMPLANT
GLOVE SURG POLYISO LF SZ8 (GLOVE) ×4 IMPLANT
GLOVE SURG UNDER POLY LF SZ7.5 (GLOVE) ×2 IMPLANT
GLOVE SURG UNDER POLY LF SZ8 (GLOVE) ×1
GOWN STRL REUS W/TWL XL LVL3 (GOWN DISPOSABLE) ×4 IMPLANT
HANDPIECE INTERPULSE COAX TIP (DISPOSABLE) ×1
HEMOSTAT SPONGE AVITENE ULTRA (HEMOSTASIS) IMPLANT
HOLDER FOLEY CATH W/STRAP (MISCELLANEOUS) ×2 IMPLANT
IMMOBILIZER KNEE 20 (SOFTGOODS) ×2
IMMOBILIZER KNEE 20 THIGH 36 (SOFTGOODS) ×1 IMPLANT
JET LAVAGE IRRISEPT WOUND (IRRIGATION / IRRIGATOR) ×2
KIT TURNOVER KIT A (KITS) ×2 IMPLANT
LAVAGE JET IRRISEPT WOUND (IRRIGATION / IRRIGATOR) ×1 IMPLANT
MANIFOLD NEPTUNE II (INSTRUMENTS) ×2 IMPLANT
NDL SAFETY ECLIPSE 18X1.5 (NEEDLE) IMPLANT
NEEDLE HYPO 18GX1.5 SHARP (NEEDLE)
NS IRRIG 1000ML POUR BTL (IV SOLUTION) ×2 IMPLANT
PACK TOTAL KNEE CUSTOM (KITS) ×2 IMPLANT
PATELLA MEDIAL ATTUN 35MM KNEE (Knees) ×2 IMPLANT
PIN DRILL FIX HALF THREAD (BIT) ×2 IMPLANT
PIN STEINMAN FIXATION KNEE (PIN) ×2 IMPLANT
PROTECTOR NERVE ULNAR (MISCELLANEOUS) ×2 IMPLANT
SAW OSC TIP CART 19.5X105X1.3 (SAW) ×2 IMPLANT
SEALER BIPOLAR AQUA 6.0 (INSTRUMENTS) ×2 IMPLANT
SET HNDPC FAN SPRY TIP SCT (DISPOSABLE) ×1 IMPLANT
SPONGE GAUZE 2X2 STER 10/PKG (GAUZE/BANDAGES/DRESSINGS)
SPONGE SURGIFOAM ABS GEL 100 (HEMOSTASIS) IMPLANT
SPONGE T-LAP 18X18 ~~LOC~~+RFID (SPONGE) ×6 IMPLANT
STAPLER VISISTAT (STAPLE) IMPLANT
STRIP CLOSURE SKIN 1/2X4 (GAUZE/BANDAGES/DRESSINGS) ×4 IMPLANT
SUT BONE WAX W31G (SUTURE) ×2 IMPLANT
SUT MNCRL AB 4-0 PS2 18 (SUTURE) IMPLANT
SUT STRATAFIX 0 PDS 27 VIOLET (SUTURE) ×2
SUT VIC AB 1 CT1 27 (SUTURE) ×2
SUT VIC AB 1 CT1 27XBRD ANTBC (SUTURE) ×2 IMPLANT
SUT VIC AB 1 CTX 36 (SUTURE)
SUT VIC AB 1 CTX36XBRD ANBCTR (SUTURE) IMPLANT
SUT VIC AB 2-0 CT1 27 (SUTURE) ×3
SUT VIC AB 2-0 CT1 TAPERPNT 27 (SUTURE) ×3 IMPLANT
SUTURE STRATFX 0 PDS 27 VIOLET (SUTURE) ×1 IMPLANT
SYR 3ML LL SCALE MARK (SYRINGE) IMPLANT
SYR 50ML LL SCALE MARK (SYRINGE) IMPLANT
TIBIAL BASE ROT PLAT SZ 5 KNEE (Knees) ×2 IMPLANT
TOWER CARTRIDGE SMART MIX (DISPOSABLE) ×2 IMPLANT
TRAY FOLEY MTR SLVR 14FR STAT (SET/KITS/TRAYS/PACK) ×2 IMPLANT
WATER STERILE IRR 1000ML POUR (IV SOLUTION) ×4 IMPLANT
WIPE CHG CHLORHEXIDINE 2% (PERSONAL CARE ITEMS) ×2 IMPLANT
WRAP KNEE MAXI GEL POST OP (GAUZE/BANDAGES/DRESSINGS) ×2 IMPLANT

## 2021-03-28 NOTE — Anesthesia Procedure Notes (Signed)
Anesthesia Regional Block: Adductor canal block   Pre-Anesthetic Checklist: , timeout performed,  Correct Patient, Correct Site, Correct Laterality,  Correct Procedure, Correct Position, site marked,  Risks and benefits discussed,  Pre-op evaluation,  At surgeon's request and post-op pain management  Laterality: Left  Prep: Maximum Sterile Barrier Precautions used, chloraprep       Needles:  Injection technique: Single-shot  Needle Type: Echogenic Stimulator Needle     Needle Length: 9cm  Needle Gauge: 22     Additional Needles:   Procedures:,,,, ultrasound used (permanent image in chart),,    Narrative:  Start time: 03/28/2021 7:07 AM End time: 03/28/2021 7:10 AM Injection made incrementally with aspirations every 5 mL.  Performed by: Personally  Anesthesiologist: Brennan Bailey, MD  Additional Notes: Risks, benefits, and alternative discussed. Patient gave consent for procedure. Patient prepped and draped in sterile fashion. Sedation administered, patient remains easily responsive to voice. Relevant anatomy identified with ultrasound guidance. Local anesthetic given in 5cc increments with no signs or symptoms of intravascular injection. No pain or paraesthesias with injection. Patient monitored throughout procedure with signs of LAST or immediate complications. Tolerated well. Ultrasound image placed in chart.  Tawny Asal, MD

## 2021-03-28 NOTE — Op Note (Signed)
NAMETYREESE, KNIEP MEDICAL RECORD NO: [id number] ACCOUNT NO: [id number] DATE OF BIRTH: [birth year] FACILITY: [facility] LOCATION: [facility] PHYSICIAN: Johnn Hai, MD  Operative Report   DATE OF PROCEDURE: 03/28/2021  PREOPERATIVE DIAGNOSIS:  End-stage osteoarthrosis, left knee.  POSTOPERATIVE DIAGNOSIS:  End-stage osteoarthrosis, left knee.  PROCEDURE PERFORMED:  Left total knee arthroplasty utilizing DePuy rotating platform, 5 femur, 5 tibia, 38 patella, 5 mm insert.  ANESTHESIA:  General.  ASSISTANT: Lacie Draft, PA  HISTORY:  A 65 year old with end-stage osteoarthrosis, medial compartment of left knee, posttraumatic, severe disabling pain.  Negative effect on her activities of daily living.  Indicated for replacement of the degenerated joint.  Risks and benefits  discussed including bleeding, infection, damage to neurovascular structures, no change in symptoms, worsening symptoms, DVT, PE, anesthetic complications, need for revision, etc.  TECHNIQUE:  The patient in supine position.  After induction of adequate general anesthesia and 2 grams Kefzol, the left lower extremity was prepped and draped and exsanguinated in the usual sterile fashion.  Thigh tourniquet inflated to 250 mmHg.   Midline incision was then made over the knee.  Full thickness flaps developed.  Median parapatellar arthrotomy performed.  Patella was gently everted.  The knee was then flexed.  She had prominence of her tibial tubercle with a history of  Osgood-Schlatter's.  Severe tricompartmental osteoarthrosis was noted.  Osteophytes were removed with rongeur.  Remnants of the medial and lateral menisci were removed as well.  L[place]ell rongeur was utilized to fashion the notch just above the femoral  notch for the insertion of the femoral drill. The femoral canal was then entered in line with the femur, irrigated.  T-handle placed and then a 5-degree left with 10 off the distal femur was selected.  Pinned, performed a  distal femoral cut.  Soft  tissues protected at all times.  We then sized off the anterior cortex of the femur to be a 5.  This was then pinned in 3 degrees of external rotation.  Placed a distal femoral cutting block and performed our anterior and posterior and chamfer cuts, with  posterior tissues protected with a large curved Crego posteriorly.  There was no notching of the femur.  I then subluxed the tibia.  Additional remnants of the menisci were removed.  PCL recessed.  The low side was medially.  We used 2 off the low side,  external alignment guide, bisecting the tibiotalar joint parallel to the shaft.  There was a fairly prominent tibial tubercle and ossicle from her Osgood-Schlatter's.  The tibial cutting block was then placed just over that.  I performed 2 cuts just to  ensure that this region would not be violated.  I then performed the cut satisfactorily, protecting soft tissues posteriorly at all times including the popliteus.  I then used a 5 mm insert for the extension block and it was fully extended and stable.   We then flexed the knee once again, subluxed the tibia, measured it to a 5, just the medial aspect of the tibial tubercle, pinned it, harvested bone centrally and impacted into the distal femur.  I drilled it centrally and put our thin guides into the  tibia.  Attention was turned to the femur.  We used our box cut bisecting the canal.  This was then pinned.  Box cut performed.  PCL removed.  Following this, placed a trial femur, which sat flush and a 5 mm insert and reduced the knee into extension,  had full extension, full  flexion, good stability with varus and valgus stressing at 0 and 30 degrees and a negative anterior drawer.  I then everted the patella, measured it to a 23, planed it to a 14 with the external alignment patellar guide.  This was  performed without difficulty.  I then measured it to a 38, medializing the template parallel to the joint, drilled our peg holes,  placed the trial patella, reduced it and had excellent patellofemoral tracking.  Following this, all the trials were then  removed.  I checked posteriorly, used Aquamantys to cauterize the geniculates in the posterior aspect.  There was no active bleeding.  Used pulsatile lavage to thoroughly clean the knee and the bony surfaces.  The knee was flexed. All surfaces thoroughly  dried.  Cement was mixed on the back table, vacuum prepared and injected into the tibial canal, digitally pressurizing it.  I then cemented and impacted the permanent tibia tray with cement on the tray.  I then cemented and impacted the femur in a  similar fashion.  All redundant cement removed.  I placed a 5 mm insert, reduced it and held axial load throughout the curing of the cement.  The patellar component was cemented and clamped as well.  Marcaine with epinephrine was placed in the posterior  aspect of the joint.  Throughout the curing of the cement, the joint was covered.  After curing of cement, at 70 minutes, tourniquet was deflated.  There was minimal bleeding which was cauterized with an Aquamantys.  I then flexed the knee, all redundant  cement meticulously removed with an osteotome.  I felt 5 was the satisfactory insert.  This was then removed.  We checked posteriorly, removed the redundant cement, copiously irrigated with pulsatile lavage and antibiotic irrigation.  I then selected a  5 insert, placed it and reduced it without difficulty, had full flexion, full extension, good stability to varus and valgus stressing at 0 and 30 degrees, negative anterior drawer.  Just prior to that, we used the Aquamantys posteriorly on the capsule  and posterolaterally and medially.  No active bleeding.  I then slightly flexed the knee.  Reapproximated the patellar arthrotomy with 1 Vicryl  interrupted figure-of-eight sutures.  This was oversewn with a running Stratafix.  We then balanced the soft  tissues and had excellent patellofemoral  tracking.  Copiously irrigated the wound again, subcutaneous with 2-0 and the skin with Monocryl.  Sterile dressing applied, immobilizer, extubated and transported to the recovery room in satisfactory condition.  The patient tolerated the procedure well, with no complications.  Assistant, Lacie Draft, [place].  Minimal blood loss.   SHW D: 03/28/2021 10:30:06 am T: 03/28/2021 11:40:00 am  JOB: [id number]

## 2021-03-28 NOTE — Discharge Instructions (Addendum)
Elevate leg above heart 6x a day for 8minutes each Use knee immobilizer while walking until can SLR x 10 Use knee immobilizer in bed to keep knee in extension Aquacel dressing may remain in place until follow up. May shower with aquacel dressing in place. If the dressing becomes saturated or peels off, you may remove aquacel dressing. Do not remove steri-strips if they are present. Place new dressing with gauze and tape or ACE bandage which should be kept clean and dry and changed daily.   INSTRUCTIONS AFTER JOINT REPLACEMENT   Remove items at home which could result in a fall. This includes throw rugs or furniture in walking pathways ICE to the affected joint every three hours while awake for 30 minutes at a time, for at least the first 3-5 days, and then as needed for pain and swelling.  Continue to use ice for pain and swelling. You may notice swelling that will progress down to the foot and ankle.  This is normal after surgery.  Elevate your leg when you are not up walking on it.   Continue to use the breathing machine you got in the hospital (incentive spirometer) which will help keep your temperature down.  It is common for your temperature to cycle up and down following surgery, especially at night when you are not up moving around and exerting yourself.  The breathing machine keeps your lungs expanded and your temperature down.   DIET:  As you were doing prior to hospitalization, we recommend a well-balanced diet.  DRESSING / WOUND CARE / SHOWERING  Keep the surgical dressing until follow up.  The dressing is water proof, so you can shower without any extra covering.  IF THE DRESSING FALLS OFF or the wound gets wet inside, change the dressing with sterile gauze.  Please use good hand washing techniques before changing the dressing.  Do not use any lotions or creams on the incision until instructed by your surgeon.    ACTIVITY  Increase activity slowly as tolerated, but follow the weight  bearing instructions below.   No driving for 6 weeks or until further direction given by your physician.  You cannot drive while taking narcotics.  No lifting or carrying greater than 10 lbs. until further directed by your surgeon. Avoid periods of inactivity such as sitting longer than an hour when not asleep. This helps prevent blood clots.  You may return to work once you are authorized by your doctor.     WEIGHT BEARING   Weight bearing as tolerated with assist device (walker, cane, etc) as directed, use it as long as suggested by your surgeon or therapist, typically at least 4-6 weeks.   EXERCISES  Results after joint replacement surgery are often greatly improved when you follow the exercise, range of motion and muscle strengthening exercises prescribed by your doctor. Safety measures are also important to protect the joint from further injury. Any time any of these exercises cause you to have increased pain or swelling, decrease what you are doing until you are comfortable again and then slowly increase them. If you have problems or questions, call your caregiver or physical therapist for advice.   Rehabilitation is important following a joint replacement. After just a few days of immobilization, the muscles of the leg can become weakened and shrink (atrophy).  These exercises are designed to build up the tone and strength of the thigh and leg muscles and to improve motion. Often times heat used for twenty to thirty  minutes before working out will loosen up your tissues and help with improving the range of motion but do not use heat for the first two weeks following surgery (sometimes heat can increase post-operative swelling).   These exercises can be done on a training (exercise) mat, on the floor, on a table or on a bed. Use whatever works the best and is most comfortable for you.    Use music or television while you are exercising so that the exercises are a pleasant break in your day.  This will make your life better with the exercises acting as a break in your routine that you can look forward to.   Perform all exercises about fifteen times, three times per day or as directed.  You should exercise both the operative leg and the other leg as well.  Exercises include:   Quad Sets - Tighten up the muscle on the front of the thigh (Quad) and hold for 5-10 seconds.   Straight Leg Raises - With your knee straight (if you were given a brace, keep it on), lift the leg to 60 degrees, hold for 3 seconds, and slowly lower the leg.  Perform this exercise against resistance later as your leg gets stronger.  Leg Slides: Lying on your back, slowly slide your foot toward your buttocks, bending your knee up off the floor (only go as far as is comfortable). Then slowly slide your foot back down until your leg is flat on the floor again.  Angel Wings: Lying on your back spread your legs to the side as far apart as you can without causing discomfort.  Hamstring Strength:  Lying on your back, push your heel against the floor with your leg straight by tightening up the muscles of your buttocks.  Repeat, but this time bend your knee to a comfortable angle, and push your heel against the floor.  You may put a pillow under the heel to make it more comfortable if necessary.   A rehabilitation program following joint replacement surgery can speed recovery and prevent re-injury in the future due to weakened muscles. Contact your doctor or a physical therapist for more information on knee rehabilitation.    CONSTIPATION  Constipation is defined medically as fewer than three stools per week and severe constipation as less than one stool per week.  Even if you have a regular bowel pattern at home, your normal regimen is likely to be disrupted due to multiple reasons following surgery.  Combination of anesthesia, postoperative narcotics, change in appetite and fluid intake all can affect your bowels.   YOU MUST  use at least one of the following options; they are listed in order of increasing strength to get the job done.  They are all available over the counter, and you may need to use some, POSSIBLY even all of these options:    Drink plenty of fluids (prune juice may be helpful) and high fiber foods Colace 100 mg by mouth twice a day  Senokot for constipation as directed and as needed Dulcolax (bisacodyl), take with full glass of water  Miralax (polyethylene glycol) once or twice a day as needed.  If you have tried all these things and are unable to have a bowel movement in the first 3-4 days after surgery call either your surgeon or your primary doctor.    If you experience loose stools or diarrhea, hold the medications until you stool forms back up.  If your symptoms do not get better within  1 week or if they get worse, check with your doctor.  If you experience "the worst abdominal pain ever" or develop nausea or vomiting, please contact the office immediately for further recommendations for treatment.   ITCHING:  If you experience itching with your medications, try taking only a single pain pill, or even half a pain pill at a time.  You can also use Benadryl over the counter for itching or also to help with sleep.   TED HOSE STOCKINGS:  Use stockings on both legs until for at least 2 weeks or as directed by physician office. They may be removed at night for sleeping.  MEDICATIONS:  See your medication summary on the "After Visit Summary" that nursing will review with you.  You may have some home medications which will be placed on hold until you complete the course of blood thinner medication.  It is important for you to complete the blood thinner medication as prescribed.  Information on my medicine - ELIQUIS (apixaban)  Why was Eliquis prescribed for you? Eliquis was prescribed for you to reduce the risk of blood clots forming after orthopedic surgery.    What do You need to know about  Eliquis? Take your Eliquis TWICE DAILY - one tablet in the morning and one tablet in the evening with or without food.  It would be best to take the dose about the same time each day.  If you have difficulty swallowing the tablet whole please discuss with your pharmacist how to take the medication safely.  Take Eliquis exactly as prescribed by your doctor and DO NOT stop taking Eliquis without talking to the doctor who prescribed the medication.  Stopping without other medication to take the place of Eliquis may increase your risk of developing a clot.  After discharge, you should have regular check-up appointments with your healthcare provider that is prescribing your Eliquis.  What do you do if you miss a dose? If a dose of ELIQUIS is not taken at the scheduled time, take it as soon as possible on the same day and twice-daily administration should be resumed.  The dose should not be doubled to make up for a missed dose.  Do not take more than one tablet of ELIQUIS at the same time.  Important Safety Information A possible side effect of Eliquis is bleeding. You should call your healthcare provider right away if you experience any of the following: Bleeding from an injury or your nose that does not stop. Unusual colored urine (red or dark brown) or unusual colored stools (red or black). Unusual bruising for unknown reasons. A serious fall or if you hit your head (even if there is no bleeding).  Some medicines may interact with Eliquis and might increase your risk of bleeding or clotting while on Eliquis. To help avoid this, consult your healthcare provider or pharmacist prior to using any new prescription or non-prescription medications, including herbals, vitamins, non-steroidal anti-inflammatory drugs (NSAIDs) and supplements.  This website has more information on Eliquis (apixaban): http://www.eliquis.com/eliquis/home   PRECAUTIONS:  If you experience chest pain or shortness of  breath - call 911 immediately for transfer to the hospital emergency department.   If you develop a fever greater that 101 F, purulent drainage from wound, increased redness or drainage from wound, foul odor from the wound/dressing, or calf pain - CONTACT YOUR SURGEON.  FOLLOW-UP APPOINTMENTS:  If you do not already have a post-op appointment, please call the office for an appointment to be seen by your surgeon.  Guidelines for how soon to be seen are listed in your "After Visit Summary", but are typically between 1-4 weeks after surgery.  OTHER INSTRUCTIONS:   Knee Replacement:  Do not place pillow under knee, focus on keeping the knee straight while resting. CPM instructions: 0-90 degrees, 2 hours in the morning, 2 hours in the afternoon, and 2 hours in the evening. Place foam block, curve side up under heel at all times except when in CPM or when walking.  DO NOT modify, tear, cut, or change the foam block in any way.  POST-OPERATIVE OPIOID TAPER INSTRUCTIONS: It is important to wean off of your opioid medication as soon as possible. If you do not need pain medication after your surgery it is ok to stop day one. Opioids include: Codeine, Hydrocodone(Norco, Vicodin), Oxycodone(Percocet, oxycontin) and hydromorphone amongst others.  Long term and even short term use of opiods can cause: Increased pain response Dependence Constipation Depression Respiratory depression And more.  Withdrawal symptoms can include Flu like symptoms Nausea, vomiting And more Techniques to manage these symptoms Hydrate well Eat regular healthy meals Stay active Use relaxation techniques(deep breathing, meditating, yoga) Do Not substitute Alcohol to help with tapering If you have been on opioids for less than two weeks and do not have pain than it is ok to stop all together.  Plan to wean off of opioids This plan should start within one week post op of your  joint replacement. Maintain the same interval or time between taking each dose and first decrease the dose.  Cut the total daily intake of opioids by one tablet each day Next start to increase the time between doses. The last dose that should be eliminated is the evening dose.   MAKE SURE YOU:  Understand these instructions.  Get help right away if you are not doing well or get worse.    Thank you for letting us be a part of your medical care team.  It is a privilege we respect greatly.  We hope these instructions will help you stay on track for a fast and full recovery!

## 2021-03-28 NOTE — Interval H&P Note (Signed)
History and Physical Interval Note:  03/28/2021 7:31 AM  Joyce [name]  has presented today for surgery, with the diagnosis of Left knee degenerative joint disease.  The various methods of treatment have been discussed with the patient and family. After consideration of risks, benefits and other options for treatment, the patient has consented to  Procedure(s): TOTAL KNEE ARTHROPLASTY (Left) as a surgical intervention.  The patient's history has been reviewed, patient examined, no change in status, stable for surgery.  I have reviewed the patient's chart and labs.  Questions were answered to the patient's satisfaction.     Johnn Hai

## 2021-03-28 NOTE — Anesthesia Procedure Notes (Signed)
Procedure Name: LMA Insertion Date/Time: 03/28/2021 7:43 AM Performed by: Anara Cowman D, CRNA Pre-anesthesia Checklist: Patient identified, Emergency Drugs available, Suction available and Patient being monitored Patient Re-evaluated:Patient Re-evaluated prior to induction Oxygen Delivery Method: Circle system utilized Preoxygenation: Pre-oxygenation with 100% oxygen Induction Type: IV induction Ventilation: Mask ventilation without difficulty LMA: LMA inserted LMA Size: 4.0 Tube type: Oral Number of attempts: 1 Placement Confirmation: positive ETCO2 and breath sounds checked- equal and bilateral Tube secured with: Tape Dental Injury: Teeth and Oropharynx as per pre-operative assessment
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
## 2021-03-28 NOTE — Brief Op Note (Signed)
03/28/2021  10:21 AM  PATIENT:  Joyce [name]  65 y.o. female  PRE-OPERATIVE DIAGNOSIS:  Left knee degenerative joint disease  POST-OPERATIVE DIAGNOSIS:  Left knee degenerative joint disease  PROCEDURE:  Procedure(s): TOTAL KNEE ARTHROPLASTY (Left)  SURGEON:  Surgeon(s) and Role:    Susa Day, MD - Primary  PHYSICIAN ASSISTANT:   ASSISTANTS: [place]   ANESTHESIA:   general  EBL:  50 mL   BLOOD ADMINISTERED:none  DRAINS: none   LOCAL MEDICATIONS USED:  MARCAINE     SPECIMEN:  No Specimen  DISPOSITION OF SPECIMEN:  N/A  COUNTS:  YES  TOURNIQUET:   Total Tourniquet Time Documented: Thigh (Left) - 70 minutes Total: Thigh (Left) - 70 minutes   DICTATION: .Other Dictation: Dictation Number [id number]  PLAN OF CARE: Admit for overnight observation  PATIENT DISPOSITION:  PACU - hemodynamically stable.   Delay start of Pharmacological VTE agent (>24hrs) due to surgical blood loss or risk of bleeding: no

## 2021-03-28 NOTE — Transfer of Care (Signed)
Immediate Anesthesia Transfer of Care Note  Patient: Joyce [name]  Procedure(s) Performed: TOTAL KNEE ARTHROPLASTY (Left: Knee)  Patient Location: PACU  Anesthesia Type:General and Regional  Level of Consciousness: awake, alert  and oriented  Airway & Oxygen Therapy: Patient Spontanous Breathing and Patient connected to face mask oxygen  Post-op Assessment: Report given to RN and Post -op Vital signs reviewed and stable  Post vital signs: Reviewed and stable  Last Vitals:  Vitals Value Taken Time  BP 196/92 03/28/21 1033  Temp    Pulse 65 03/28/21 1035  Resp 10 03/28/21 1035  SpO2 99 % 03/28/21 1035  Vitals shown include unvalidated device data.  Last Pain:  Vitals:   03/28/21 0534  PainSc: 10-Worst pain ever      Patients Stated Pain Goal: 7 ([id number])  Complications: No notable events documented.

## 2021-03-28 NOTE — Plan of Care (Signed)
Plan of care reviewed and discussed with the patient. 

## 2021-03-28 NOTE — Evaluation (Signed)
Physical Therapy Evaluation Patient Details Name: Joyce [name] MRN: [id number] DOB: 09-24-1955 Today's Date: 03/28/2021   History of Present Illness  65 yo female s/p L TKA 03/28/21. Hx of R TKA 2004  Clinical Impression  On eval POD 0, pt required Min A for mobility. She walked ~6 feet with a RW. Significant pain at rest and with activity. Ambulation distance limited by pain. Will follow and progress activity as tolerated.     Follow Up Recommendations Follow surgeon's recommendation for DC plan and follow-up therapies    Equipment Recommendations  Rolling walker with 5" wheels    Recommendations for Other Services       Precautions / Restrictions Precautions Precautions: Fall;Knee Required Braces or Orthoses: Knee Immobilizer - Left Knee Immobilizer - Left: Discontinue once straight leg raise with < 10 degree lag Restrictions Weight Bearing Restrictions: No Other Position/Activity Restrictions: WAT      Mobility  Bed Mobility Overal bed mobility: Needs Assistance Bed Mobility: Supine to Sit     Supine to sit: Supervision     General bed mobility comments: Supv for safety, lines.    Transfers Overall transfer level: Needs assistance Equipment used: Rolling walker (2 wheeled) Transfers: Sit to/from Stand Sit to Stand: Min assist;From elevated surface         General transfer comment: Assist to rise, steady. Cues for safety, hand/LE placement.  Ambulation/Gait Ambulation/Gait assistance: Min assist Gait Distance (Feet): 6 Feet Assistive device: Rolling walker (2 wheeled) Gait Pattern/deviations: Step-to pattern;Antalgic     General Gait Details: Small amount of assist ot steady. follow with recliner. Cues for safety, sequence, technique. Distance limited by pain.  Stairs            Wheelchair Mobility    Modified Rankin (Stroke Patients Only)       Balance Overall balance assessment: Needs assistance         Standing balance support:  Bilateral upper extremity supported Standing balance-Leahy Scale: Fair                               Pertinent Vitals/Pain Pain Assessment: 0-10 Pain Score: 9  Pain Location: L knee Pain Descriptors / Indicators: Discomfort;Sore;Aching Pain Intervention(s): Limited activity within patient's tolerance;Monitored during session;Premedicated before session;Patient requesting pain meds-RN notified;Ice applied;Repositioned    Home Living Family/patient expects to be discharged to:: Private residence   Available Help at Discharge: Family   Home Access: Stairs to enter Entrance Stairs-Rails:  (has rails but they are not safe to use) Entrance Stairs-Number of Steps: 3 Home Layout: One level Home Equipment: Cane - quad      Prior Function Level of Independence: Needs assistance   Gait / Transfers Assistance Needed: uses quad cane. husband helps her up/down stairs     Comments: spouse has recently had some medical issues and has some mobility issues of his own     Hand Dominance        Extremity/Trunk Assessment   Upper Extremity Assessment Upper Extremity Assessment: Overall WFL for tasks assessed    Lower Extremity Assessment Lower Extremity Assessment: Generalized weakness    Cervical / Trunk Assessment Cervical / Trunk Assessment: Normal  Communication   Communication: No difficulties  Cognition Arousal/Alertness: Awake/alert Behavior During Therapy: WFL for tasks assessed/performed Overall Cognitive Status: Within Functional Limits for tasks assessed  General Comments      Exercises     Assessment/Plan    PT Assessment Patient needs continued PT services  PT Problem List Decreased strength;Decreased range of motion;Decreased mobility;Decreased activity tolerance;Decreased balance;Decreased knowledge of use of DME;Pain       PT Treatment Interventions DME instruction;Therapeutic  exercise;Gait training;Balance training;Stair training;Functional mobility training;Patient/family education;Therapeutic activities    PT Goals (Current goals can be found in the Care Plan section)  Acute Rehab PT Goals Patient Stated Goal: less pain. to be able to keep up with grandkids. PT Goal Formulation: With patient Time For Goal Achievement: 04/11/21 Potential to Achieve Goals: Good    Frequency 7X/week   Barriers to discharge        Co-evaluation               AM-PAC PT "6 Clicks" Mobility  Outcome Measure Help needed turning from your back to your side while in a flat bed without using bedrails?: None Help needed moving from lying on your back to sitting on the side of a flat bed without using bedrails?: None Help needed moving to and from a bed to a chair (including a wheelchair)?: A Little Help needed standing up from a chair using your arms (e.g., wheelchair or bedside chair)?: A Little Help needed to walk in hospital room?: A Little Help needed climbing 3-5 steps with a railing? : A Little 6 Click Score: 20    End of Session Equipment Utilized During Treatment: Gait belt Activity Tolerance: Patient tolerated treatment well Patient left: in chair;with call bell/phone within reach;with chair alarm set   PT Visit Diagnosis: Other abnormalities of gait and mobility (R26.89);Pain Pain - Right/Left: Left Pain - part of body: Knee    Time: [id number] PT Time Calculation (min) (ACUTE ONLY): 37 min   Charges:   PT Evaluation $PT Eval Low Complexity: 1 Low PT Treatments $Gait Training: 8-22 mins          Doreatha Massed, PT Acute Rehabilitation  Office: [phone] Pager: [phone]

## 2021-03-28 NOTE — Plan of Care (Signed)

## 2021-03-29 ENCOUNTER — Encounter (HOSPITAL_COMMUNITY): Payer: Self-pay | Admitting: Specialist

## 2021-03-29 DIAGNOSIS — I10 Essential (primary) hypertension: Secondary | ICD-10-CM

## 2021-03-29 DIAGNOSIS — M1712 Unilateral primary osteoarthritis, left knee: Secondary | ICD-10-CM | POA: Diagnosis not present

## 2021-03-29 LAB — BASIC METABOLIC PANEL
Anion gap: 10 (ref 5–15)
BUN: 31 mg/dL — ABNORMAL HIGH (ref 8–23)
CO2: 21 mmol/L — ABNORMAL LOW (ref 22–32)
Calcium: 8.4 mg/dL — ABNORMAL LOW (ref 8.9–10.3)
Chloride: 103 mmol/L (ref 98–111)
Creatinine, Ser: 1.74 mg/dL — ABNORMAL HIGH (ref 0.44–1.00)
GFR, Estimated: 32 mL/min — ABNORMAL LOW (ref >60)
Glucose, Bld: 146 mg/dL — ABNORMAL HIGH (ref 70–99)
Potassium: 4.3 mmol/L (ref 3.5–5.1)
Sodium: 134 mmol/L — ABNORMAL LOW (ref 135–145)

## 2021-03-29 LAB — CBC
HCT: 30.7 % — ABNORMAL LOW (ref 36.0–46.0)
Hemoglobin: 9.8 g/dL — ABNORMAL LOW (ref 12.0–15.0)
MCH: 26.6 pg (ref 26.0–34.0)
MCHC: 31.9 g/dL (ref 30.0–36.0)
MCV: 83.2 fL (ref 80.0–100.0)
Platelets: 259 10*3/uL (ref 150–400)
RBC: 3.69 MIL/uL — ABNORMAL LOW (ref 3.87–5.11)
RDW: 14.4 % (ref 11.5–15.5)
WBC: 11.1 10*3/uL — ABNORMAL HIGH (ref 4.0–10.5)
nRBC: 0 % (ref 0.0–0.2)

## 2021-03-29 NOTE — TOC Transition Note (Signed)
Transition of Care [facility]) - CM/SW Discharge Note   Patient Details  Name: Joyce [name] MRN: [id number] Date of Birth: Apr 21, 1956  Transition of [facility]) CM/SW Contact:  Lennart Pall, LCSW Phone Number: 03/29/2021, 10:09 AM   Clinical Narrative:    Met with pt and confirming need for rolling walker - order placed with [employer].  Orders in for HHPT - pt requesting [place] - referral placed.  No further TOC needs.   Final next level of care: [place] Barriers to Discharge: No Barriers Identified   Patient Goals and CMS Choice Patient states their goals for this hospitalization and ongoing recovery are:: return home      Discharge Placement                       Discharge Plan and Services                DME Arranged: Walker rolling DME Agency: [employer] Date DME Agency Contacted: 03/29/21 Time DME Agency Contacted: [id number] Representative spoke with at DME Agency: Ovid Curd HH Arranged: PT [place]: [place] ([place]) Date [place]: 03/29/21 Time [place]: 1009 Representative spoke with at [place]: Ramond Marrow B.  Social Determinants of Health (SDOH) Interventions     Readmission Risk Interventions Readmission Risk Prevention Plan 08/02/2019 02/15/2019  Transportation Screening - Complete  PCP or Specialist Appt within 3-5 Days - Not Complete  Not Complete comments - Pt going to SNF Rehab  HRI or [place] - Not Complete  HRI or Home Care Consult comments - Pt going to SNF Rehab  Social Work Consult for [place] Planning/Counseling - Complete  Palliative Care Screening - Not Applicable  Medication Review (RN Care Manager) - Complete  PCP or Specialist appointment within 3-5 days of discharge Complete -  Some recent data might be hidden

## 2021-03-29 NOTE — Progress Notes (Signed)
Subjective: 1 Day Post-Op Procedure(s) (LRB): TOTAL KNEE ARTHROPLASTY (Left) Patient reports pain as 3 on 0-10 scale.   Denies CP or SOB.  Voiding without difficulty. Positive flatus. Objective: Vital signs in last 24 hours: Temp:  [97.5 F (36.4 C)-98.8 F (37.1 C)] 98.7 F (37.1 C) (08/25 0637) Pulse Rate:  [55-70] 65 (08/25 0637) Resp:  [12-21] 18 (08/25 0637) BP: (123-207)/(48-94) 123/48 (08/25 0637) SpO2:  [97 %-100 %] 100 % (08/25 [id number])  Intake/Output from previous day: 08/24 0701 - 08/25 0700 In: 2672.2 [P.O.:600; I.V.:1972.2; IV Piggyback:100] Out: [id number] [Urine:1775; Blood:50] Intake/Output this shift: No intake/output data recorded.  Recent Labs    03/29/21 0311  HGB 9.8*   Recent Labs    03/29/21 0311  WBC 11.1*  RBC 3.69*  HCT 30.7*  PLT 259   Recent Labs    03/29/21 0311  NA 134*  K 4.3  CL 103  CO2 21*  BUN 31*  CREATININE 1.74*  GLUCOSE 146*  CALCIUM 8.4*   No results for input(s): LABPT, INR in the last 72 hours.  Neurologically intact Intact pulses distally Dorsiflexion/Plantar flexion intact Compartment soft No DVT  Assessment/Plan:  1 Day Post-Op Procedure(s) (LRB): TOTAL KNEE ARTHROPLASTY (Left) Advance diet Up with therapy D/C IV fluids Plan for discharge tomorrow Appreciate medical consult  Check HbA1c   Principal Problem:   Left knee DJD Active Problems:   Essential hypertension, benign   Hyperlipidemia   History of stroke   CKD (chronic kidney disease), stage III ([place])      Joyce [name] 03/29/2021, '@NOW'$ 

## 2021-03-29 NOTE — Progress Notes (Signed)
Physical Therapy Treatment Patient Details Name: Joyce [name] MRN: [id number] DOB: [birth year] Today's Date: 03/29/2021    History of Present Illness 65 yo female s/p L TKA 03/28/21. Hx of R TKA 2004    PT Comments    Pt ambulated in hallway and practiced safe stair technique.  Pt also performed a few exercises upon return to bed.  Pt reports she will d/c home tomorrow to ensure her medications are working appropriately.      Follow Up Recommendations  Follow surgeon's recommendation for DC plan and follow-up therapies;Home health PT     Equipment Recommendations  Rolling walker with 5" wheels    Recommendations for Other Services       Precautions / Restrictions Precautions Precautions: Fall;Knee Precaution Comments: pt able to perform SLR Restrictions LLE Weight Bearing: Weight bearing as tolerated    Mobility  Bed Mobility Overal bed mobility: Needs Assistance Bed Mobility: Sit to Supine     Supine to sit: Supervision;HOB elevated Sit to supine: Min guard;HOB elevated   General bed mobility comments: min/guard for safety, increased time and effort    Transfers Overall transfer level: Needs assistance Equipment used: Rolling walker (2 wheeled) Transfers: Sit to/from Stand Sit to Stand: Min guard         General transfer comment: pt able to recall safe technique  Ambulation/Gait Ambulation/Gait assistance: Min guard Gait Distance (Feet): 160 Feet Assistive device: Rolling walker (2 wheeled) Gait Pattern/deviations: Antalgic;Decreased stance time - left;Step-through pattern     General Gait Details: verbal cues for sequence, RW positioning, step length, improved pace this afternoon   Stairs Stairs: Yes Stairs assistance: Min guard Stair Management: Step to pattern;Backwards;With walker Number of Stairs: 2 General stair comments: verbal cues for sequence and safety; first performed with one rail and pt's quad cane (which was missing a rubber stopper)  however pt uncertain home rails with hold (not sturdy) so educated and performed backwards technique with RW which pt preferred.   Wheelchair Mobility    Modified Rankin (Stroke Patients Only)       Balance                                            Cognition Arousal/Alertness: Awake/alert Behavior During Therapy: WFL for tasks assessed/performed Overall Cognitive Status: Within Functional Limits for tasks assessed                                        Exercises Total Joint Exercises Ankle Circles/Pumps: AROM;Both;10 reps Quad Sets: AROM;Left;10 reps Heel Slides: AAROM;Left;10 reps    General Comments        Pertinent Vitals/Pain Pain Assessment: 0-10 Pain Score: 5  Pain Location: L knee Pain Descriptors / Indicators: Discomfort;Sore;Aching Pain Intervention(s): Repositioned;Monitored during session;Premedicated before session    Home Living                      Prior Function            PT Goals (current goals can now be found in the care plan section) Progress towards PT goals: Progressing toward goals    Frequency    7X/week      PT Plan Current plan remains appropriate    Co-evaluation  AM-PAC PT "6 Clicks" Mobility   Outcome Measure  Help needed turning from your back to your side while in a flat bed without using bedrails?: None Help needed moving from lying on your back to sitting on the side of a flat bed without using bedrails?: None Help needed moving to and from a bed to a chair (including a wheelchair)?: A Little Help needed standing up from a chair using your arms (e.g., wheelchair or bedside chair)?: A Little Help needed to walk in hospital room?: A Little Help needed climbing 3-5 steps with a railing? : A Little 6 Click Score: 20    End of Session Equipment Utilized During Treatment: Gait belt Activity Tolerance: Patient tolerated treatment well Patient left: with call  bell/phone within reach;in bed;with bed alarm set   PT Visit Diagnosis: Other abnormalities of gait and mobility (R26.89)     Time: [id number] PT Time Calculation (min) (ACUTE ONLY): 26 min  Charges:  $Gait Training: 8-22 mins $Therapeutic Exercise: 8-22 mins                    Jannette Spanner PT, DPT Acute Rehabilitation Services Pager: [phone] Office: [phone]  York Ram E 03/29/2021, 3:27 PM

## 2021-03-29 NOTE — Anesthesia Postprocedure Evaluation (Signed)
Anesthesia Post Note  Patient: Joyce [name]  Procedure(s) Performed: TOTAL KNEE ARTHROPLASTY (Left: Knee)     Patient location during evaluation: PACU Anesthesia Type: General Level of consciousness: awake and alert and oriented Pain management: pain level controlled Vital Signs Assessment: post-procedure vital signs reviewed and stable Respiratory status: spontaneous breathing, nonlabored ventilation and respiratory function stable Cardiovascular status: blood pressure returned to baseline Postop Assessment: no apparent nausea or vomiting Anesthetic complications: no   No notable events documented.     Marthenia Rolling

## 2021-03-29 NOTE — Progress Notes (Signed)
Chaplain engaged in an initial visit with Joyce [name].  Sydni shared her desire to get the Advanced Directive, Healthcare POA document completed.  Zykira is very aware of what she wants if she can't speak for herself.  Quality of life is really important to her, as well as not being a financial burden to her family.  Kamyah has been working to prepare her family and put necessary things in order.  She does not want to leave them in a strained position.  Because Braelin's husband has had two strokes, she wants her two daughter and son to be her healthcare agents.  Chaplain explained the importance of having conversations with them about her wants and needs if she is ever unable to communicate in a healthcare setting.    Chaplain offered education, support, listening and presence.  Chaplain is able to follow-up.    03/29/21 1000  Clinical Encounter Type  Visited With Patient  Visit Type Initial;Social support

## 2021-03-29 NOTE — Plan of Care (Signed)

## 2021-03-29 NOTE — Progress Notes (Signed)
Physical Therapy Treatment Patient Details Name: Joyce [name] MRN: [id number] DOB: 07/04/1956 Today's Date: 03/29/2021    History of Present Illness 65 yo female s/p L TKA 03/28/21. Hx of R TKA 2004    PT Comments    Pt requiring increased time for mobility however able to ambulate 80 feet with RW.  Pt will need to practice steps prior to d/c.    Follow Up Recommendations  Follow surgeon's recommendation for DC plan and follow-up therapies;Home health PT     Equipment Recommendations  Rolling walker with 5" wheels    Recommendations for Other Services       Precautions / Restrictions Precautions Precautions: Fall;Knee Precaution Comments: pt able to perform SLR Restrictions LLE Weight Bearing: Weight bearing as tolerated    Mobility  Bed Mobility Overal bed mobility: Needs Assistance Bed Mobility: Supine to Sit     Supine to sit: Supervision;HOB elevated          Transfers Overall transfer level: Needs assistance Equipment used: Rolling walker (2 wheeled) Transfers: Sit to/from Stand Sit to Stand: Min guard         General transfer comment: verbal cues for UE and LE positioning  Ambulation/Gait Ambulation/Gait assistance: Min guard Gait Distance (Feet): 80 Feet Assistive device: Rolling walker (2 wheeled) Gait Pattern/deviations: Step-to pattern;Antalgic;Decreased stance time - left     General Gait Details: verbal cues for sequence, RW positioning, step length, pt very slow pace so cue to increase upon returning to room   Stairs             Wheelchair Mobility    Modified Rankin (Stroke Patients Only)       Balance                                            Cognition Arousal/Alertness: Awake/alert Behavior During Therapy: WFL for tasks assessed/performed Overall Cognitive Status: Within Functional Limits for tasks assessed                                        Exercises      General  Comments        Pertinent Vitals/Pain Pain Assessment: 0-10 Pain Score: 6  Pain Location: L knee Pain Descriptors / Indicators: Discomfort;Sore;Aching Pain Intervention(s): Premedicated before session;Monitored during session;Repositioned;Ice applied    Home Living                      Prior Function            PT Goals (current goals can now be found in the care plan section) Progress towards PT goals: Progressing toward goals    Frequency    7X/week      PT Plan Current plan remains appropriate    Co-evaluation              AM-PAC PT "6 Clicks" Mobility   Outcome Measure  Help needed turning from your back to your side while in a flat bed without using bedrails?: None Help needed moving from lying on your back to sitting on the side of a flat bed without using bedrails?: None Help needed moving to and from a bed to a chair (including a wheelchair)?: A Little Help needed standing up from a chair using  your arms (e.g., wheelchair or bedside chair)?: A Little Help needed to walk in hospital room?: A Little Help needed climbing 3-5 steps with a railing? : A Little 6 Click Score: 20    End of Session Equipment Utilized During Treatment: Gait belt Activity Tolerance: Patient tolerated treatment well Patient left: in chair;with call bell/phone within reach;with chair alarm set   PT Visit Diagnosis: Other abnormalities of gait and mobility (R26.89)     Time: [id number] PT Time Calculation (min) (ACUTE ONLY): 29 min  Charges:  $Gait Training: 23-37 mins                    Jannette Spanner PT, DPT Acute Rehabilitation Services Pager: [phone] Office: [phone]    Trena Platt 03/29/2021, 12:56 PM

## 2021-03-29 NOTE — Progress Notes (Signed)
PROGRESS NOTE    Joyce [name]  [id number] DOB: [birth year] DOA: 03/28/2021 PCP: [facility]    Brief Narrative:  65 y.o. female with medical history significant of DJD postop day 0 left knee arthroplasty by Dr. Maxie Better.  Hospitalist consulted for medical management history of prior CVA as well as hypertension.  Assessment & Plan:   Principal Problem:   Left knee DJD Active Problems:   Essential hypertension, benign   Hyperlipidemia   History of stroke   CKD (chronic kidney disease), stage III (HCC)  Left knee arthritis -status post left knee arthroplasty postop day, continued with PT, per primary service   Hypertension -Had resumed home labetalol '300mg'$  tid and clonidine patch, Norvasc '10mg'$  qd -home lisinopril HCTZ on hold in setting of CKD 3. -Will cont with PRN IV hydralazine as needed -Agree with a1c screening  Hyperlipidemia -continue simvastatin as tolerated  History of CVA -resume home aspirin when cleared by surgery   Disposition-recheck consult, will continue to follow along    DVT prophylaxis: Eliquis Code Status: Full Family Communication: Pt in room, family not at bedside    Antimicrobials: Anti-infectives (From admission, onward)    Start     Dose/Rate Route Frequency Ordered Stop   03/28/21 1400  ceFAZolin (ANCEF) IVPB 2g/100 mL premix       Note to Pharmacy: Dose per pharmacy   2 g 200 mL/hr over 30 Minutes Intravenous Every 6 hours 03/28/21 1214 03/28/21 2133   03/28/21 0600  ceFAZolin (ANCEF) IVPB 2g/100 mL premix        2 g 200 mL/hr over 30 Minutes Intravenous On call to O.R. 03/28/21 0516 03/28/21 0745       Subjective: Reports feeling better today  Objective: Vitals:   03/29/21 0637 03/29/21 0800 03/29/21 0832 03/29/21 1200  BP: (!) 123/48 (!) 144/66  (!) 154/66  Pulse: 65 62 64 60  Resp: '18 17  18  '$ Temp: 98.7 F (37.1 C)   97.8 F (36.6 C)  TempSrc: Oral Oral  Oral  SpO2: 100% 98% 100% 99%  Weight:      Height:         Intake/Output Summary (Last 24 hours) at 03/29/2021 1418 Last data filed at 03/29/2021 1307 Gross per 24 hour  Intake 1815.12 ml  Output 1525 ml  Net 290.12 ml   Filed Weights   03/28/21 0534  Weight: 103.3 kg    Examination: General exam: Awake, laying in bed, in nad Respiratory system: Normal respiratory effort, no wheezing Cardiovascular system: regular rate, s1, s2 Gastrointestinal system: Soft, nondistended, positive BS Central nervous system: CN2-12 grossly intact, strength intact Extremities: Perfused, no clubbing Skin: Normal skin turgor, no notable skin lesions seen Psychiatry: Mood normal // no visual hallucinations   Data Reviewed: I have personally reviewed following labs and imaging studies  CBC: Recent Labs  Lab 03/29/21 0311  WBC 11.1*  HGB 9.8*  HCT 30.7*  MCV 83.2  PLT [id number]   Basic Metabolic Panel: Recent Labs  Lab 03/29/21 0311  NA 134*  K 4.3  CL 103  CO2 21*  GLUCOSE 146*  BUN 31*  CREATININE 1.74*  CALCIUM 8.4*   GFR: Estimated Creatinine Clearance: 39.8 mL/min (A) (by C-G formula based on SCr of 1.74 mg/dL (H)). Liver Function Tests: No results for input(s): AST, ALT, ALKPHOS, BILITOT, PROT, ALBUMIN in the last 168 hours. No results for input(s): LIPASE, AMYLASE in the last 168 hours. No results for input(s): AMMONIA in the last 168  hours. Coagulation Profile: No results for input(s): INR, PROTIME in the last 168 hours. Cardiac Enzymes: No results for input(s): CKTOTAL, CKMB, CKMBINDEX, TROPONINI in the last 168 hours. BNP (last 3 results) No results for input(s): PROBNP in the last 8760 hours. HbA1C: No results for input(s): HGBA1C in the last 72 hours. CBG: No results for input(s): GLUCAP in the last 168 hours. Lipid Profile: No results for input(s): CHOL, HDL, LDLCALC, TRIG, CHOLHDL, LDLDIRECT in the last 72 hours. Thyroid Function Tests: No results for input(s): TSH, T4TOTAL, FREET4, T3FREE, THYROIDAB in the last 72  hours. Anemia Panel: No results for input(s): VITAMINB12, FOLATE, FERRITIN, TIBC, IRON, RETICCTPCT in the last 72 hours. Sepsis Labs: No results for input(s): PROCALCITON, LATICACIDVEN in the last 168 hours.  Recent Results (from the past 240 hour(s))  SARS Coronavirus 2 (TAT 6-24 hrs)     Status: None   Collection Time: 03/26/21 12:00 AM  Result Value Ref Range Status   SARS Coronavirus 2 RESULT: NEGATIVE  Final    Comment: RESULT: NEGATIVESARS-CoV-2 INTERPRETATION:A NEGATIVE  test result means that SARS-CoV-2 RNA was not present in the specimen above the limit of detection of this test. This does not preclude a possible SARS-CoV-2 infection and should not be used as the  sole basis for patient management decisions. Negative results must be combined with clinical observations, patient history, and epidemiological information. Optimum specimen types and timing for peak viral levels during infections caused by SARS-CoV-2  have not been determined. Collection of multiple specimens or types of specimens may be necessary to detect virus. Improper specimen collection and handling, sequence variability under primers/probes, or organism present below the limit of detection may  lead to false negative results. Positive and negative predictive values of testing are highly dependent on prevalence. False negative test results are more likely when prevalence of disease is high.The expected result is NEGATIVE.Fact S heet for  Healthcare Providers: LocalChronicle.no Sheet for Patients: SalonLookup.es Reference Range - Negative      Radiology Studies: DG Knee 2 Views Left  Result Date: 03/28/2021 CLINICAL DATA:  Status post left knee arthroplasty EXAM: LEFT KNEE - 1-2 VIEW COMPARISON:  12/27/2020 FINDINGS: Left knee arthroplasty in expected alignment. No periprosthetic lucency or fracture. There has been patellar resurfacing. Recent postsurgical  change includes air and edema in the soft tissues and joint space. Chronic curvilinear ossific density in the region of the femoral insertion of the MCL. IMPRESSION: Left knee arthroplasty without immediate postoperative complication. Electronically Signed   By: Keith Rake M.D.   On: 03/28/2021 12:38    Scheduled Meds:  acidophilus  1 capsule Oral Daily   amLODipine  10 mg Oral Daily   apixaban  2.5 mg Oral Q12H   cholecalciferol  5,000 Units Oral Daily   cloNIDine  0.2 mg Transdermal Weekly   docusate sodium  100 mg Oral BID   gabapentin  300 mg Oral BID   gabapentin  600 mg Oral QHS   lisinopril  20 mg Oral Daily   And   hydrochlorothiazide  12.5 mg Oral Daily   labetalol  300 mg Oral TID   polyethylene glycol  17 g Oral Daily   traZODone  50 mg Oral QHS   vitamin C  125 mg Oral Daily   Continuous Infusions:  dextrose 5 % and 0.45 % NaCl with KCl 10 mEq/L Stopped (03/29/21 1027)   methocarbamol (ROBAXIN) IV       LOS: 0 days   Marylu Lund, MD [facility] Pager On Amion  If 7PM-7AM, please contact night-coverage 03/29/2021, 2:18 PM

## 2021-03-30 DIAGNOSIS — Z8673 Personal history of transient ischemic attack (TIA), and cerebral infarction without residual deficits: Secondary | ICD-10-CM | POA: Diagnosis not present

## 2021-03-30 DIAGNOSIS — M1712 Unilateral primary osteoarthritis, left knee: Secondary | ICD-10-CM | POA: Diagnosis not present

## 2021-03-30 DIAGNOSIS — I10 Essential (primary) hypertension: Secondary | ICD-10-CM | POA: Diagnosis not present

## 2021-03-30 LAB — BASIC METABOLIC PANEL
Anion gap: 9 (ref 5–15)
BUN: 47 mg/dL — ABNORMAL HIGH (ref 8–23)
CO2: 23 mmol/L (ref 22–32)
Calcium: 8.4 mg/dL — ABNORMAL LOW (ref 8.9–10.3)
Chloride: 104 mmol/L (ref 98–111)
Creatinine, Ser: 2.29 mg/dL — ABNORMAL HIGH (ref 0.44–1.00)
GFR, Estimated: 23 mL/min — ABNORMAL LOW (ref >60)
Glucose, Bld: 100 mg/dL — ABNORMAL HIGH (ref 70–99)
Potassium: 4.5 mmol/L (ref 3.5–5.1)
Sodium: 136 mmol/L (ref 135–145)

## 2021-03-30 LAB — CBC
HCT: 28.5 % — ABNORMAL LOW (ref 36.0–46.0)
Hemoglobin: 8.7 g/dL — ABNORMAL LOW (ref 12.0–15.0)
MCH: 26.2 pg (ref 26.0–34.0)
MCHC: 30.5 g/dL (ref 30.0–36.0)
MCV: 85.8 fL (ref 80.0–100.0)
Platelets: 226 10*3/uL (ref 150–400)
RBC: 3.32 MIL/uL — ABNORMAL LOW (ref 3.87–5.11)
RDW: 14.8 % (ref 11.5–15.5)
WBC: 5.8 10*3/uL (ref 4.0–10.5)
nRBC: 0 % (ref 0.0–0.2)

## 2021-03-30 MED ORDER — HYDROMORPHONE HCL 2 MG PO TABS: 2.0000 mg | ORAL_TABLET | ORAL | 0 refills | Status: DC | PRN

## 2021-03-30 MED ORDER — GABAPENTIN 300 MG PO CAPS
300.0000 mg | ORAL_CAPSULE | Freq: Two times a day (BID) | ORAL | Status: DC
  Administered 2021-03-30 – 2021-03-31 (×3): 300 mg via ORAL
  Filled 2021-03-30 (×4): qty 1

## 2021-03-30 MED ORDER — DOCUSATE SODIUM 100 MG PO CAPS: 100.0000 mg | ORAL_CAPSULE | Freq: Two times a day (BID) | ORAL | 0 refills | Status: DC

## 2021-03-30 MED ORDER — APIXABAN 2.5 MG PO TABS: 2.5000 mg | ORAL_TABLET | Freq: Two times a day (BID) | ORAL | 1 refills | Status: DC

## 2021-03-30 MED ORDER — SODIUM CHLORIDE 0.9 % IV SOLN: INTRAVENOUS | Status: AC

## 2021-03-30 MED ORDER — POLYETHYLENE GLYCOL 3350 17 G PO PACK: 17.0000 g | PACK | Freq: Every day | ORAL | 0 refills | Status: DC

## 2021-03-30 MED ORDER — METHOCARBAMOL 500 MG PO TABS: 500.0000 mg | ORAL_TABLET | Freq: Four times a day (QID) | ORAL | 1 refills | Status: DC | PRN

## 2021-03-30 NOTE — Progress Notes (Addendum)
Subjective: 2 Days Post-Op Procedure(s) (LRB): TOTAL KNEE ARTHROPLASTY (Left) Patient reports pain as severe.   L knee and thigh pain today Also c/o pain from broken tooth Does not feel she will be ready to go home today  Objective: Vital signs in last 24 hours: Temp:  [97.8 F (36.6 C)-98.5 F (36.9 C)] 98.3 F (36.8 C) (08/26 0538) Pulse Rate:  [57-65] 59 (08/26 0538) Resp:  [16-18] 16 (08/26 0538) BP: (120-154)/(58-73) 120/73 (08/26 0538) SpO2:  [97 %-99 %] 98 % (08/26 0538)  Intake/Output from previous day: 08/25 0701 - 08/26 0700 In: 1283 [P.O.:1060; I.V.:223] Out: 700 [Urine:700] Intake/Output this shift: Total I/O In: 200 [P.O.:200] Out: 400 [Urine:400]  Recent Labs    03/29/21 0311 03/30/21 0325  HGB 9.8* 8.7*   Recent Labs    03/29/21 0311 03/30/21 0325  WBC 11.1* 5.8  RBC 3.69* 3.32*  HCT 30.7* 28.5*  PLT 259 226   Recent Labs    03/29/21 0311 03/30/21 0325  NA 134* 136  K 4.3 4.5  CL 103 104  CO2 21* 23  BUN 31* 47*  CREATININE 1.74* 2.29*  GLUCOSE 146* 100*  CALCIUM 8.4* 8.4*   No results for input(s): LABPT, INR in the last 72 hours.  Neurologically intact ABD soft Neurovascular intact Sensation intact distally Intact pulses distally Dorsiflexion/Plantar flexion intact Incision: dressing C/D/I and no drainage No cellulitis present Compartment soft No sign of DVT   Assessment/Plan: 2 Days Post-Op Procedure(s) (LRB): TOTAL KNEE ARTHROPLASTY (Left) Advance diet Up with therapy D/C IV fluids HHPT set up Plan D/C this weekend when pain is well controlled Will add 3rd gabapentin dose during the day  Cecilie Kicks 03/30/2021, 10:02 AM

## 2021-03-30 NOTE — Plan of Care (Signed)
  Problem: Pain Management: Goal: Pain level will decrease with appropriate interventions Outcome: Progressing   Problem: Activity: Goal: Risk for activity intolerance will decrease Outcome: Progressing   

## 2021-03-30 NOTE — Progress Notes (Signed)
Physical Therapy Treatment Patient Details Name: Joyce [name] MRN: [id number] DOB: 1956/01/18 Today's Date: 03/30/2021    History of Present Illness 65 yo female s/p L TKA 03/28/21. Hx of R TKA 2004    PT Comments    Pt reports increased pain in left knee today as well as broke her tooth last night.  Pt premedicated for session and agreeable to mobilize however only tolerated OOB to recliner at this time.    Follow Up Recommendations  Follow surgeon's recommendation for DC plan and follow-up therapies;Home health PT     Equipment Recommendations  Rolling walker with 5" wheels    Recommendations for Other Services       Precautions / Restrictions Precautions Precautions: Fall;Knee Restrictions LLE Weight Bearing: Weight bearing as tolerated    Mobility  Bed Mobility Overal bed mobility: Needs Assistance Bed Mobility: Supine to Sit     Supine to sit: Min assist;HOB elevated     General bed mobility comments: assist for left LE due to pain    Transfers Overall transfer level: Needs assistance Equipment used: Rolling walker (2 wheeled) Transfers: Sit to/from [employer] Transfers Sit to Stand: Min guard;From elevated surface Stand pivot transfers: Min guard       General transfer comment: increased time required, pt recalls safe technique; pt only felt able to take a few steps over to recliner due to pain  Ambulation/Gait                 Stairs             Wheelchair Mobility    Modified Rankin (Stroke Patients Only)       Balance                                            Cognition Arousal/Alertness: Awake/alert Behavior During Therapy: WFL for tasks assessed/performed Overall Cognitive Status: Within Functional Limits for tasks assessed                                        Exercises      General Comments        Pertinent Vitals/Pain Pain Assessment: 0-10 Pain Score: 6  Pain  Location: L knee Pain Descriptors / Indicators: Discomfort;Sore;Aching Pain Intervention(s): Repositioned;Monitored during session;Premedicated before session    Home Living                      Prior Function            PT Goals (current goals can now be found in the care plan section) Progress towards PT goals: Not progressing toward goals - comment (pain limiting)    Frequency    7X/week      PT Plan Current plan remains appropriate    Co-evaluation              AM-PAC PT "6 Clicks" Mobility   Outcome Measure  Help needed turning from your back to your side while in a flat bed without using bedrails?: A Little Help needed moving from lying on your back to sitting on the side of a flat bed without using bedrails?: A Little Help needed moving to and from a bed to a chair (including a wheelchair)?: A Little Help needed standing  up from a chair using your arms (e.g., wheelchair or bedside chair)?: A Little Help needed to walk in hospital room?: A Lot Help needed climbing 3-5 steps with a railing? : A Lot 6 Click Score: 16    End of Session Equipment Utilized During Treatment: Gait belt Activity Tolerance: Patient tolerated treatment well Patient left: with call bell/phone within reach;in chair Nurse Communication: Mobility status PT Visit Diagnosis: Other abnormalities of gait and mobility (R26.89);Pain Pain - Right/Left: Left Pain - part of body: Knee     Time: [id number] PT Time Calculation (min) (ACUTE ONLY): 19 min Jannette Spanner PT, DPT Acute Rehabilitation Services Pager: [phone] Office: [phone]    York Ram E 03/30/2021, 3:13 PM

## 2021-03-30 NOTE — Progress Notes (Signed)
PT Cancellation Note  Patient Details Name: Joyce [name] MRN: [id number] DOB: Jul 12, 1956   Cancelled Treatment:    Reason Eval/Treat Not Completed: Pain limiting ability to participate Attempted to see pt about an hour after pain meds, however pt reports increased pain and requesting more meds so RN notified.  Pt also provided with ice packs.   Raylee Adamec,KATHrine E 03/30/2021, 3:58 PM Jannette Spanner PT, DPT Acute Rehabilitation Services Pager: [phone] Office: [phone]

## 2021-03-30 NOTE — Progress Notes (Signed)
PROGRESS NOTE    Joyce [name]  [id number] DOB: 12/12/1955 DOA: 03/28/2021 PCP: [facility]    Brief Narrative:  65 y.o. female with medical history significant of DJD postop day 0 left knee arthroplasty by Dr. Maxie Better.  Hospitalist consulted for medical management history of prior CVA as well as hypertension.  Assessment & Plan:   Principal Problem:   Left knee DJD Active Problems:   Essential hypertension, benign   Hyperlipidemia   History of stroke   CKD (chronic kidney disease), stage III (HCC)  Left knee arthritis -status post left knee arthroplasty postop day, continued with PT, per primary service -Reports increased pain, attributes to overuse yesterday   Hypertension -Had resumed home labetalol '300mg'$  tid and clonidine patch, Norvasc '10mg'$  qd -home lisinopril HCTZ was resumed this visit -Will cont with PRN IV hydralazine as needed -Cr up to 2.29 this AM. Will hold ACEI for now and start basal IVF  Hyperlipidemia -continue simvastatin as tolerated  History of CVA -resume home aspirin when cleared by surgery   Disposition-recheck consult, will continue to follow along  ARF -Cr up to 2.29 this AM -On genlte IVF hydratio -Repeat bmet in AM  Cracked tooth -Reportedly cracked tooth while eating bagel yesterday. Acknowledges previously damaged tooth that very likely was already cracked before    DVT prophylaxis: Eliquis Code Status: Full Family Communication: Pt in room, family not at bedside    Antimicrobials: Anti-infectives (From admission, onward)    Start     Dose/Rate Route Frequency Ordered Stop   03/28/21 1400  ceFAZolin (ANCEF) IVPB 2g/100 mL premix       Note to Pharmacy: Dose per pharmacy   2 g 200 mL/hr over 30 Minutes Intravenous Every 6 hours 03/28/21 1214 03/28/21 2133   03/28/21 0600  ceFAZolin (ANCEF) IVPB 2g/100 mL premix        2 g 200 mL/hr over 30 Minutes Intravenous On call to O.R. 03/28/21 0516 03/28/21 0745        Subjective: Complaining of LE pain and mouth pain  Objective: Vitals:   03/29/21 1637 03/29/21 2058 03/30/21 0538 03/30/21 1345  BP: (!) 142/62 (!) 134/58 120/73 (!) 178/66  Pulse: 65 (!) 57 (!) 59 66  Resp: '16 18 16 17  '$ Temp: 98.5 F (36.9 C) 98 F (36.7 C) 98.3 F (36.8 C) (!) 97.5 F (36.4 C)  TempSrc: Oral Oral Oral   SpO2: 99% 97% 98% 98%  Weight:      Height:        Intake/Output Summary (Last 24 hours) at 03/30/2021 1445 Last data filed at 03/30/2021 1350 Gross per 24 hour  Intake 1460 ml  Output 1300 ml  Net 160 ml    Filed Weights   03/28/21 0534  Weight: 103.3 kg    Examination: General exam: Conversant, in no acute distress Respiratory system: normal chest rise, clear, no audible wheezing Cardiovascular system: regular rhythm, s1-s2 Gastrointestinal system: Nondistended, nontender, pos BS Central nervous system: No seizures, no tremors Extremities: No cyanosis, no joint deformities Skin: No rashes, no pallor Psychiatry: Affect normal // no auditory hallucinations   Data Reviewed: I have personally reviewed following labs and imaging studies  CBC: Recent Labs  Lab 03/29/21 0311 03/30/21 0325  WBC 11.1* 5.8  HGB 9.8* 8.7*  HCT 30.7* 28.5*  MCV 83.2 85.8  PLT 259 [id number]    Basic Metabolic Panel: Recent Labs  Lab 03/29/21 0311 03/30/21 0325  NA 134* 136  K 4.3 4.5  CL 103 104  CO2 21* 23  GLUCOSE 146* 100*  BUN 31* 47*  CREATININE 1.74* 2.29*  CALCIUM 8.4* 8.4*    GFR: Estimated Creatinine Clearance: 30.3 mL/min (A) (by C-G formula based on SCr of 2.29 mg/dL (H)). Liver Function Tests: No results for input(s): AST, ALT, ALKPHOS, BILITOT, PROT, ALBUMIN in the last 168 hours. No results for input(s): LIPASE, AMYLASE in the last 168 hours. No results for input(s): AMMONIA in the last 168 hours. Coagulation Profile: No results for input(s): INR, PROTIME in the last 168 hours. Cardiac Enzymes: No results for input(s): CKTOTAL, CKMB,  CKMBINDEX, TROPONINI in the last 168 hours. BNP (last 3 results) No results for input(s): PROBNP in the last 8760 hours. HbA1C: No results for input(s): HGBA1C in the last 72 hours. CBG: No results for input(s): GLUCAP in the last 168 hours. Lipid Profile: No results for input(s): CHOL, HDL, LDLCALC, TRIG, CHOLHDL, LDLDIRECT in the last 72 hours. Thyroid Function Tests: No results for input(s): TSH, T4TOTAL, FREET4, T3FREE, THYROIDAB in the last 72 hours. Anemia Panel: No results for input(s): VITAMINB12, FOLATE, FERRITIN, TIBC, IRON, RETICCTPCT in the last 72 hours. Sepsis Labs: No results for input(s): PROCALCITON, LATICACIDVEN in the last 168 hours.  Recent Results (from the past 240 hour(s))  SARS Coronavirus 2 (TAT 6-24 hrs)     Status: None   Collection Time: 03/26/21 12:00 AM  Result Value Ref Range Status   SARS Coronavirus 2 RESULT: NEGATIVE  Final    Comment: RESULT: NEGATIVESARS-CoV-2 INTERPRETATION:A NEGATIVE  test result means that SARS-CoV-2 RNA was not present in the specimen above the limit of detection of this test. This does not preclude a possible SARS-CoV-2 infection and should not be used as the  sole basis for patient management decisions. Negative results must be combined with clinical observations, patient history, and epidemiological information. Optimum specimen types and timing for peak viral levels during infections caused by SARS-CoV-2  have not been determined. Collection of multiple specimens or types of specimens may be necessary to detect virus. Improper specimen collection and handling, sequence variability under primers/probes, or organism present below the limit of detection may  lead to false negative results. Positive and negative predictive values of testing are highly dependent on prevalence. False negative test results are more likely when prevalence of disease is high.The expected result is NEGATIVE.Fact S heet for  Healthcare Providers:  LocalChronicle.no Sheet for Patients: SalonLookup.es Reference Range - Negative       Radiology Studies: No results found.  Scheduled Meds:  acidophilus  1 capsule Oral Daily   amLODipine  10 mg Oral Daily   apixaban  2.5 mg Oral Q12H   cholecalciferol  5,000 Units Oral Daily   cloNIDine  0.2 mg Transdermal Weekly   docusate sodium  100 mg Oral BID   gabapentin  300 mg Oral q12n4p   gabapentin  600 mg Oral QHS   labetalol  300 mg Oral TID   polyethylene glycol  17 g Oral Daily   traZODone  50 mg Oral QHS   vitamin C  125 mg Oral Daily   Continuous Infusions:  sodium chloride 75 mL/hr at 03/30/21 1122   dextrose 5 % and 0.45 % NaCl with KCl 10 mEq/L Stopped (03/29/21 1027)   methocarbamol (ROBAXIN) IV       LOS: 0 days   Marylu Lund, MD [facility] Pager On Amion  If 7PM-7AM, please contact night-coverage 03/30/2021, 2:45 PM

## 2021-03-31 DIAGNOSIS — M1712 Unilateral primary osteoarthritis, left knee: Secondary | ICD-10-CM | POA: Diagnosis not present

## 2021-03-31 LAB — BASIC METABOLIC PANEL
Anion gap: 8 (ref 5–15)
BUN: 39 mg/dL — ABNORMAL HIGH (ref 8–23)
CO2: 24 mmol/L (ref 22–32)
Calcium: 8.8 mg/dL — ABNORMAL LOW (ref 8.9–10.3)
Chloride: 107 mmol/L (ref 98–111)
Creatinine, Ser: 1.8 mg/dL — ABNORMAL HIGH (ref 0.44–1.00)
GFR, Estimated: 31 mL/min — ABNORMAL LOW (ref >60)
Glucose, Bld: 140 mg/dL — ABNORMAL HIGH (ref 70–99)
Potassium: 4.5 mmol/L (ref 3.5–5.1)
Sodium: 139 mmol/L (ref 135–145)

## 2021-03-31 LAB — CBC
HCT: 27.5 % — ABNORMAL LOW (ref 36.0–46.0)
Hemoglobin: 8.9 g/dL — ABNORMAL LOW (ref 12.0–15.0)
MCH: 27 pg (ref 26.0–34.0)
MCHC: 32.4 g/dL (ref 30.0–36.0)
MCV: 83.3 fL (ref 80.0–100.0)
Platelets: 215 10*3/uL (ref 150–400)
RBC: 3.3 MIL/uL — ABNORMAL LOW (ref 3.87–5.11)
RDW: 14.5 % (ref 11.5–15.5)
WBC: 9 10*3/uL (ref 4.0–10.5)
nRBC: 0 % (ref 0.0–0.2)

## 2021-03-31 NOTE — Progress Notes (Addendum)
Physical Therapy Treatment Patient Details Name: Joyce [name] MRN: [id number] DOB: [birth year] Today's Date: 03/31/2021    History of Present Illness 65 yo female s/p L TKA 03/28/21. Hx of R TKA 2004    PT Comments    POD # 3 pm session Pt with improved alertness.  Not as groggy as earlier session.  Assisted with stair training up backward due to "weak rails" at 25% VC's on proper sequencing and walker placement. Assisted back to bed per pt request to rest before D/C to home. Pt stated she uses a "transport" service and plans to have a family member stop by [facility] to pick up her perscriptions.   Follow Up Recommendations  Follow surgeon's recommendation for DC plan and follow-up therapies;Home health PT     Equipment Recommendations  Rolling walker with 5" wheels    Recommendations for Other Services       Precautions / Restrictions Precautions Precautions: Fall;Knee Precaution Comments: pt able to perform SLR Restrictions Weight Bearing Restrictions: No LLE Weight Bearing: Weight bearing as tolerated    Mobility  Bed Mobility Overal bed mobility: Needs Assistance Bed Mobility: Sit to Supine     Supine to sit: Supervision;Min guard Sit to supine: Min assist   General bed mobility comments: required Min Assist to support L LE back onto bed as well as increased time    Transfers Overall transfer level: Needs assistance Equipment used: Rolling walker (2 wheeled) Transfers: Sit to/from [facility] Sit to Stand: Supervision Stand pivot transfers: Supervision       General transfer comment: moving a little batter this afternoon but still slow.  Ambulation/Gait Ambulation/Gait assistance: Supervision Gait Distance (Feet): 18 Feet Assistive device: Rolling walker (2 wheeled) Gait Pattern/deviations: Antalgic;Decreased stance time - left;Step-through pattern Gait velocity: decreased   General Gait Details: decreased amb distance this session to  focus + safe energy for stairs.   Stairs (2 steps)    Up backward with walker at [place] with 25% VC's on proper tech and safety         Wheelchair Mobility    Modified Rankin (Stroke Patients Only)       Balance                                            Cognition Arousal/Alertness: Awake/alert Behavior During Therapy: WFL for tasks assessed/performed Overall Cognitive Status: Within Functional Limits for tasks assessed                                 General Comments: AxO x 3 very slow and groggy this morning.  Following all commands but VERY slow.      Exercises      General Comments        Pertinent Vitals/Pain Pain Assessment: 0-10 Pain Score: 10-Worst pain ever Pain Location: Always says it 10/10 L knee pain Pain Descriptors / Indicators: Discomfort;Sore;Aching Pain Intervention(s): Monitored during session;Repositioned    Home Living                      Prior Function            PT Goals (current goals can now be found in the care plan section) Progress towards PT goals: Progressing toward goals    Frequency  7X/week      PT Plan Current plan remains appropriate    Co-evaluation              AM-PAC PT "6 Clicks" Mobility   Outcome Measure  Help needed turning from your back to your side while in a flat bed without using bedrails?: A Little Help needed moving from lying on your back to sitting on the side of a flat bed without using bedrails?: A Little Help needed moving to and from a bed to a chair (including a wheelchair)?: A Little Help needed standing up from a chair using your arms (e.g., wheelchair or bedside chair)?: A Little Help needed to walk in hospital room?: A Lot Help needed climbing 3-5 steps with a railing? : A Lot 6 Click Score: 16    End of Session Equipment Utilized During Treatment: Gait belt Activity Tolerance: Patient tolerated treatment well Patient left:  with call bell/phone within reach;in bed Nurse Communication: Mobility status (pt ready for D/C to home) PT Visit Diagnosis: Other abnormalities of gait and mobility (R26.89);Pain Pain - Right/Left: Left Pain - part of body: Knee     Time: 1610-1640 PT Time Calculation (min) (ACUTE ONLY): 30 min  Charges:  $Gait Training: 8-22 mins $Therapeutic Activity: 8-22 mins                     Rica Koyanagi  PTA Acute  Rehabilitation Services Pager      [phone] Office      [phone] {

## 2021-03-31 NOTE — Plan of Care (Signed)
Careplans complete ° °
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
## 2021-03-31 NOTE — Progress Notes (Signed)
Orthopedics Progress Note  Subjective: Left knee pain with activity, mobilizing better.   Objective:  Vitals:   03/30/21 2124 03/31/21 0546  BP: (!) 183/73 (!) 153/55  Pulse: 73 66  Resp: (!) 21 15  Temp: (!) 100.4 F (38 C) (!) 100.6 F (38.1 C)  SpO2: 97% 94%    General: Awake and alert  Musculoskeletal: Left knee with Aquacel dressing in place, mod swelling, Neg [place] Neurovascularly intact  Lab Results  Component Value Date   WBC 9.0 03/31/2021   HGB 8.9 (L) 03/31/2021   HCT 27.5 (L) 03/31/2021   MCV 83.3 03/31/2021   PLT 215 03/31/2021       Component Value Date/Time   NA 139 03/31/2021 0334   K 4.5 03/31/2021 0334   CL 107 03/31/2021 0334   CO2 24 03/31/2021 0334   GLUCOSE 140 (H) 03/31/2021 0334   BUN 39 (H) 03/31/2021 0334   CREATININE 1.80 (H) 03/31/2021 0334   CREATININE 1.18 (H) 12/03/2012 0950   CALCIUM 8.8 (L) 03/31/2021 0334   GFRNONAA 31 (L) 03/31/2021 0334   GFRAA 30 (L) 08/02/2019 0535    Lab Results  Component Value Date   INR 0.94 01/22/2018   INR 1.02 11/12/2016   INR 1.03 06/19/2016    Assessment/Plan: POD #3 s/p Procedure(s): TOTAL KNEE ARTHROPLASTY Discharge to home once therapy clears her for discharge  Remo Lipps R. Veverly Fells, MD 03/31/2021 8:16 AM

## 2021-03-31 NOTE — Progress Notes (Signed)
Physical Therapy Treatment Patient Details Name: Joyce [name] MRN: [id number] DOB: 11-Dec-1955 Today's Date: 03/31/2021    History of Present Illness 65 yo female s/p L TKA 03/28/21. Hx of R TKA 2004    PT Comments    POD # 3  General bed mobility comments: demonstarted and instructed how to use belt to self assist LE OOB.  Required increased time nearly 10 min to complete. General Gait Details: VERY slow/groggy gait nearly 15 min to amb around the bed to the other side to recliner.  Pt always reports her pain as 10/10 and was given an increased mg of pain meds this morning. Wanted to practice stairs but unable due to MAX c/o sleepiness and now c/o B UE weakness/fatigue fro  walker use.    Follow Up Recommendations  Follow surgeon's recommendation for DC plan and follow-up therapies;Home health PT     Equipment Recommendations  Rolling walker with 5" wheels    Recommendations for Other Services       Precautions / Restrictions Precautions Precautions: Fall;Knee Precaution Comments: pt able to perform SLR Restrictions Weight Bearing Restrictions: No LLE Weight Bearing: Weight bearing as tolerated    Mobility  Bed Mobility Overal bed mobility: Needs Assistance Bed Mobility: Supine to Sit     Supine to sit: Supervision;Min guard     General bed mobility comments: demonstarted and instructed how to use belt to self assist LE OOB.  Required increased time nearly 10 min to complete.    Transfers Overall transfer level: Needs assistance Equipment used: Rolling walker (2 wheeled) Transfers: Sit to/from [facility] Sit to Stand: Supervision Stand pivot transfers: Supervision;Min guard       General transfer comment: increased, increased time but self able to rise from recliner.  Ambulation/Gait Ambulation/Gait assistance: Supervision Gait Distance (Feet): 22 Feet Assistive device: Rolling walker (2 wheeled) Gait Pattern/deviations: Antalgic;Decreased  stance time - left;Step-through pattern Gait velocity: decreased   General Gait Details: VERY slow/groggy gait nearly 15 min to amb around the bed to the other side to recliner.  Pt always reports her pain as 10/10 and was given an increased mg of pain meds this morning.   Stairs             Wheelchair Mobility    Modified Rankin (Stroke Patients Only)       Balance                                            Cognition Arousal/Alertness: Awake/alert Behavior During Therapy: WFL for tasks assessed/performed Overall Cognitive Status: Within Functional Limits for tasks assessed                                 General Comments: AxO x 3 very slow and groggy this morning.  Following all commands but VERY slow.      Exercises      General Comments        Pertinent Vitals/Pain Pain Assessment: 0-10 Pain Score: 10-Worst pain ever Pain Location: Always says it 10/10 L knee pain Pain Descriptors / Indicators: Discomfort;Sore;Aching Pain Intervention(s): Monitored during session;Repositioned    Home Living                      Prior Function  PT Goals (current goals can now be found in the care plan section) Progress towards PT goals: Progressing toward goals    Frequency    7X/week      PT Plan Current plan remains appropriate    Co-evaluation              AM-PAC PT "6 Clicks" Mobility   Outcome Measure  Help needed turning from your back to your side while in a flat bed without using bedrails?: A Little Help needed moving from lying on your back to sitting on the side of a flat bed without using bedrails?: A Little Help needed moving to and from a bed to a chair (including a wheelchair)?: A Little Help needed standing up from a chair using your arms (e.g., wheelchair or bedside chair)?: A Little Help needed to walk in hospital room?: A Lot Help needed climbing 3-5 steps with a railing? : A  Lot 6 Click Score: 16    End of Session Equipment Utilized During Treatment: Gait belt Activity Tolerance: Patient tolerated treatment well Patient left: with call bell/phone within reach;in chair Nurse Communication: Mobility status PT Visit Diagnosis: Other abnormalities of gait and mobility (R26.89);Pain Pain - Right/Left: Left Pain - part of body: Knee     Time: 1150-1230 PT Time Calculation (min) (ACUTE ONLY): 40 min  Charges:  $Gait Training: 23-37 mins $Therapeutic Activity: 8-22 mins                    Rica Koyanagi  PTA Acute  Rehabilitation Services Pager      [phone] Office      [phone]

## 2021-04-03 ENCOUNTER — Other Ambulatory Visit (HOSPITAL_COMMUNITY): Payer: Self-pay | Admitting: Specialist

## 2021-04-03 ENCOUNTER — Inpatient Hospital Stay (HOSPITAL_COMMUNITY): Admission: RE | Admit: 2021-04-03 | Payer: Medicare Other | Source: Ambulatory Visit

## 2021-04-03 DIAGNOSIS — M79662 Pain in left lower leg: Secondary | ICD-10-CM

## 2021-04-04 ENCOUNTER — Ambulatory Visit (HOSPITAL_COMMUNITY)
Admission: RE | Admit: 2021-04-04 | Discharge: 2021-04-04 | Disposition: A | Discharge status: Home / Self Care | Payer: Medicare Other | Source: Ambulatory Visit | Attending: Internal Medicine | Admitting: Internal Medicine

## 2021-04-04 ENCOUNTER — Other Ambulatory Visit: Payer: Self-pay

## 2021-04-04 DIAGNOSIS — M79662 Pain in left lower leg: Secondary | ICD-10-CM | POA: Diagnosis not present

## 2021-05-03 ENCOUNTER — Encounter (HOSPITAL_COMMUNITY): Payer: Self-pay | Admitting: Physical Therapy

## 2021-05-03 ENCOUNTER — Other Ambulatory Visit: Payer: Self-pay

## 2021-05-03 ENCOUNTER — Ambulatory Visit (HOSPITAL_COMMUNITY): Payer: Medicare Other | Attending: Specialist | Admitting: Physical Therapy

## 2021-05-03 DIAGNOSIS — R2689 Other abnormalities of gait and mobility: Secondary | ICD-10-CM | POA: Diagnosis present

## 2021-05-03 DIAGNOSIS — M25562 Pain in left knee: Secondary | ICD-10-CM | POA: Diagnosis not present

## 2021-05-03 DIAGNOSIS — M25662 Stiffness of left knee, not elsewhere classified: Secondary | ICD-10-CM | POA: Insufficient documentation

## 2021-05-03 NOTE — Patient Instructions (Signed)
Access Code: [email or web address] URL: [email or web address] Date: 05/03/2021 Prepared by: Josue Hector  Exercises Supine Quad Set - 3 x daily - 7 x weekly - 2 sets - 10 reps - 5 second hold Active Straight Leg Raise with Quad Set - 3 x daily - 7 x weekly - 2 sets - 10 reps Supine Heel Slide with Strap - 3 x daily - 7 x weekly - 2 sets - 10 reps - second hold Supine Gluteal Sets - 3 x daily - 7 x weekly - 2 sets - 10 reps - second hold Supine Ankle Pumps - 3 x daily - 7 x weekly - 2 sets - 10 reps
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
## 2021-05-07 ENCOUNTER — Telehealth (HOSPITAL_COMMUNITY): Payer: Self-pay

## 2021-05-07 ENCOUNTER — Ambulatory Visit (HOSPITAL_COMMUNITY): Payer: Medicare Other | Attending: Specialist

## 2021-05-07 DIAGNOSIS — R2689 Other abnormalities of gait and mobility: Secondary | ICD-10-CM | POA: Insufficient documentation

## 2021-05-07 DIAGNOSIS — M6281 Muscle weakness (generalized): Secondary | ICD-10-CM | POA: Insufficient documentation

## 2021-05-07 DIAGNOSIS — R2681 Unsteadiness on feet: Secondary | ICD-10-CM | POA: Insufficient documentation

## 2021-05-07 DIAGNOSIS — M25562 Pain in left knee: Secondary | ICD-10-CM | POA: Insufficient documentation

## 2021-05-07 DIAGNOSIS — M25662 Stiffness of left knee, not elsewhere classified: Secondary | ICD-10-CM | POA: Insufficient documentation

## 2021-05-07 DIAGNOSIS — R262 Difficulty in walking, not elsewhere classified: Secondary | ICD-10-CM | POA: Insufficient documentation

## 2021-05-07 NOTE — Telephone Encounter (Signed)
No show #1, called and left message concerning missed apt today.  Reminded next apt date and time with contact number included if needs to cancel/reschedule in the future.    Ihor Austin, LPTA/CLT; Delana Meyer [phone]

## 2021-05-08 ENCOUNTER — Encounter (HOSPITAL_COMMUNITY): Payer: Medicare Other

## 2021-05-09 ENCOUNTER — Encounter (HOSPITAL_COMMUNITY): Payer: Medicare Other

## 2021-05-09 ENCOUNTER — Telehealth (HOSPITAL_COMMUNITY): Payer: Self-pay

## 2021-05-09 NOTE — Telephone Encounter (Signed)
S/W PATIENT SHE CX DUE TO LACK OF TRANSPORTATION

## 2021-05-10 ENCOUNTER — Ambulatory Visit (HOSPITAL_COMMUNITY): Payer: Medicare Other

## 2021-05-10 ENCOUNTER — Other Ambulatory Visit: Payer: Self-pay

## 2021-05-10 ENCOUNTER — Telehealth (HOSPITAL_COMMUNITY): Payer: Self-pay

## 2021-05-10 ENCOUNTER — Encounter (HOSPITAL_COMMUNITY): Payer: Self-pay

## 2021-05-10 DIAGNOSIS — M25562 Pain in left knee: Secondary | ICD-10-CM | POA: Diagnosis not present

## 2021-05-10 DIAGNOSIS — R2689 Other abnormalities of gait and mobility: Secondary | ICD-10-CM | POA: Diagnosis present

## 2021-05-10 DIAGNOSIS — R2681 Unsteadiness on feet: Secondary | ICD-10-CM | POA: Diagnosis present

## 2021-05-10 DIAGNOSIS — M25662 Stiffness of left knee, not elsewhere classified: Secondary | ICD-10-CM | POA: Diagnosis present

## 2021-05-10 DIAGNOSIS — R262 Difficulty in walking, not elsewhere classified: Secondary | ICD-10-CM | POA: Diagnosis present

## 2021-05-10 DIAGNOSIS — M6281 Muscle weakness (generalized): Secondary | ICD-10-CM | POA: Diagnosis present

## 2021-05-10 NOTE — Telephone Encounter (Signed)
Called MD's office following pt.'s apt to discuss findings with Homan's test and increased calf pain.
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
## 2021-05-14 ENCOUNTER — Other Ambulatory Visit: Payer: Self-pay

## 2021-05-14 ENCOUNTER — Ambulatory Visit (HOSPITAL_COMMUNITY): Payer: Medicare Other | Admitting: Physical Therapy

## 2021-05-14 ENCOUNTER — Encounter (HOSPITAL_COMMUNITY): Payer: Self-pay | Admitting: Physical Therapy

## 2021-05-14 DIAGNOSIS — R2689 Other abnormalities of gait and mobility: Secondary | ICD-10-CM

## 2021-05-14 DIAGNOSIS — M25562 Pain in left knee: Secondary | ICD-10-CM | POA: Diagnosis not present

## 2021-05-14 DIAGNOSIS — M25662 Stiffness of left knee, not elsewhere classified: Secondary | ICD-10-CM
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
## 2021-05-15 ENCOUNTER — Other Ambulatory Visit (HOSPITAL_BASED_OUTPATIENT_CLINIC_OR_DEPARTMENT_OTHER): Payer: Self-pay | Admitting: Geriatric Medicine

## 2021-05-15 ENCOUNTER — Other Ambulatory Visit (HOSPITAL_COMMUNITY): Payer: Self-pay | Admitting: Geriatric Medicine

## 2021-05-15 DIAGNOSIS — R945 Abnormal results of liver function studies: Secondary | ICD-10-CM

## 2021-05-16 ENCOUNTER — Other Ambulatory Visit: Payer: Self-pay

## 2021-05-16 ENCOUNTER — Encounter (HOSPITAL_COMMUNITY): Payer: Self-pay | Admitting: Physical Therapy

## 2021-05-16 ENCOUNTER — Ambulatory Visit (HOSPITAL_COMMUNITY): Payer: Medicare Other | Admitting: Physical Therapy

## 2021-05-16 DIAGNOSIS — M25562 Pain in left knee: Secondary | ICD-10-CM | POA: Diagnosis not present

## 2021-05-16 DIAGNOSIS — R262 Difficulty in walking, not elsewhere classified: Secondary | ICD-10-CM

## 2021-05-16 DIAGNOSIS — R2689 Other abnormalities of gait and mobility: Secondary | ICD-10-CM

## 2021-05-16 DIAGNOSIS — M25662 Stiffness of left knee, not elsewhere classified: Secondary | ICD-10-CM
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
## 2021-05-18 ENCOUNTER — Other Ambulatory Visit: Payer: Self-pay

## 2021-05-18 ENCOUNTER — Ambulatory Visit (HOSPITAL_COMMUNITY): Payer: Medicare Other

## 2021-05-18 DIAGNOSIS — R2689 Other abnormalities of gait and mobility: Secondary | ICD-10-CM

## 2021-05-18 DIAGNOSIS — M25562 Pain in left knee: Secondary | ICD-10-CM | POA: Diagnosis not present

## 2021-05-18 DIAGNOSIS — R262 Difficulty in walking, not elsewhere classified: Secondary | ICD-10-CM

## 2021-05-18 DIAGNOSIS — M25662 Stiffness of left knee, not elsewhere classified: Secondary | ICD-10-CM
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
## 2021-05-21 ENCOUNTER — Ambulatory Visit (HOSPITAL_COMMUNITY): Payer: Medicare Other | Admitting: Physical Therapy

## 2021-05-21 ENCOUNTER — Encounter (HOSPITAL_COMMUNITY): Payer: Self-pay | Admitting: Physical Therapy

## 2021-05-21 ENCOUNTER — Other Ambulatory Visit: Payer: Self-pay

## 2021-05-21 DIAGNOSIS — R2689 Other abnormalities of gait and mobility: Secondary | ICD-10-CM

## 2021-05-21 DIAGNOSIS — M25562 Pain in left knee: Secondary | ICD-10-CM

## 2021-05-21 DIAGNOSIS — M25662 Stiffness of left knee, not elsewhere classified: Secondary | ICD-10-CM

## 2021-05-21 DIAGNOSIS — R262 Difficulty in walking, not elsewhere classified: Secondary | ICD-10-CM
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
## 2021-05-23 ENCOUNTER — Ambulatory Visit (HOSPITAL_COMMUNITY): Payer: Medicare Other | Admitting: Physical Therapy

## 2021-05-24 ENCOUNTER — Ambulatory Visit (HOSPITAL_COMMUNITY): Payer: Medicare Other

## 2021-05-24 ENCOUNTER — Encounter (HOSPITAL_COMMUNITY): Payer: Medicare Other

## 2021-05-24 ENCOUNTER — Encounter (HOSPITAL_COMMUNITY): Payer: Self-pay

## 2021-05-25 ENCOUNTER — Encounter (HOSPITAL_COMMUNITY): Payer: Self-pay | Admitting: Physical Therapy

## 2021-05-25 ENCOUNTER — Ambulatory Visit (HOSPITAL_COMMUNITY): Payer: Medicare Other | Admitting: Physical Therapy

## 2021-05-25 DIAGNOSIS — M25562 Pain in left knee: Secondary | ICD-10-CM

## 2021-05-25 DIAGNOSIS — M6281 Muscle weakness (generalized): Secondary | ICD-10-CM

## 2021-05-25 DIAGNOSIS — M25662 Stiffness of left knee, not elsewhere classified: Secondary | ICD-10-CM

## 2021-05-25 DIAGNOSIS — R262 Difficulty in walking, not elsewhere classified: Secondary | ICD-10-CM

## 2021-05-25 DIAGNOSIS — R2689 Other abnormalities of gait and mobility: Secondary | ICD-10-CM

## 2021-05-25 DIAGNOSIS — R2681 Unsteadiness on feet: Secondary | ICD-10-CM
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
## 2021-05-28 ENCOUNTER — Other Ambulatory Visit: Payer: Self-pay

## 2021-05-28 ENCOUNTER — Ambulatory Visit (HOSPITAL_COMMUNITY): Payer: Medicare Other

## 2021-05-28 ENCOUNTER — Encounter (HOSPITAL_COMMUNITY): Payer: Self-pay

## 2021-05-28 DIAGNOSIS — M25562 Pain in left knee: Secondary | ICD-10-CM | POA: Diagnosis not present

## 2021-05-28 DIAGNOSIS — M25662 Stiffness of left knee, not elsewhere classified: Secondary | ICD-10-CM

## 2021-05-28 DIAGNOSIS — M6281 Muscle weakness (generalized): Secondary | ICD-10-CM

## 2021-05-28 DIAGNOSIS — R262 Difficulty in walking, not elsewhere classified: Secondary | ICD-10-CM

## 2021-05-28 DIAGNOSIS — R2689 Other abnormalities of gait and mobility: Secondary | ICD-10-CM
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
## 2021-05-30 ENCOUNTER — Ambulatory Visit (HOSPITAL_COMMUNITY): Payer: Medicare Other | Admitting: Physical Therapy

## 2021-05-31 ENCOUNTER — Ambulatory Visit (HOSPITAL_COMMUNITY)
Admission: RE | Admit: 2021-05-31 | Discharge: 2021-05-31 | Disposition: A | Discharge status: Home / Self Care | Payer: Medicare Other | Source: Ambulatory Visit | Attending: Geriatric Medicine | Admitting: Geriatric Medicine

## 2021-05-31 ENCOUNTER — Other Ambulatory Visit: Payer: Self-pay

## 2021-05-31 ENCOUNTER — Encounter (HOSPITAL_COMMUNITY): Payer: Medicare Other

## 2021-05-31 DIAGNOSIS — R945 Abnormal results of liver function studies: Secondary | ICD-10-CM | POA: Insufficient documentation

## 2021-06-04 ENCOUNTER — Ambulatory Visit (HOSPITAL_COMMUNITY): Payer: Medicare Other | Admitting: Physical Therapy

## 2021-06-04 ENCOUNTER — Encounter (HOSPITAL_COMMUNITY): Payer: Self-pay | Admitting: Physical Therapy

## 2021-06-04 ENCOUNTER — Other Ambulatory Visit: Payer: Self-pay

## 2021-06-04 DIAGNOSIS — M6281 Muscle weakness (generalized): Secondary | ICD-10-CM

## 2021-06-04 DIAGNOSIS — R2689 Other abnormalities of gait and mobility: Secondary | ICD-10-CM

## 2021-06-04 DIAGNOSIS — M25662 Stiffness of left knee, not elsewhere classified: Secondary | ICD-10-CM

## 2021-06-04 DIAGNOSIS — R262 Difficulty in walking, not elsewhere classified: Secondary | ICD-10-CM

## 2021-06-04 DIAGNOSIS — M25562 Pain in left knee: Secondary | ICD-10-CM
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
## 2021-06-05 ENCOUNTER — Encounter (HOSPITAL_COMMUNITY): Payer: Medicare Other

## 2021-06-06 ENCOUNTER — Encounter (HOSPITAL_COMMUNITY): Payer: Self-pay | Admitting: Physical Therapy

## 2021-06-06 ENCOUNTER — Other Ambulatory Visit: Payer: Self-pay

## 2021-06-06 ENCOUNTER — Ambulatory Visit (HOSPITAL_COMMUNITY): Payer: Medicare Other | Attending: Specialist | Admitting: Physical Therapy

## 2021-06-06 DIAGNOSIS — M25562 Pain in left knee: Secondary | ICD-10-CM | POA: Diagnosis not present

## 2021-06-06 DIAGNOSIS — R2689 Other abnormalities of gait and mobility: Secondary | ICD-10-CM | POA: Insufficient documentation

## 2021-06-06 DIAGNOSIS — R2681 Unsteadiness on feet: Secondary | ICD-10-CM | POA: Diagnosis present

## 2021-06-06 DIAGNOSIS — M6281 Muscle weakness (generalized): Secondary | ICD-10-CM | POA: Insufficient documentation

## 2021-06-06 DIAGNOSIS — R262 Difficulty in walking, not elsewhere classified: Secondary | ICD-10-CM | POA: Diagnosis present

## 2021-06-06 DIAGNOSIS — M25662 Stiffness of left knee, not elsewhere classified: Secondary | ICD-10-CM | POA: Insufficient documentation
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
## 2021-06-06 NOTE — Patient Instructions (Signed)
Access Code: [email or web address] URL: [email or web address] Date: 06/06/2021 Prepared by: Josue Hector  Exercises Gastroc Stretch on Wall - 2 x daily - 7 x weekly - 1 sets - 3 reps - 30 second hold Seated Knee Flexion AAROM - 2 x daily - 7 x weekly - 1 sets - 5 reps - 10 second hold Seated Hamstring Stretch - 2 x daily - 7 x weekly - 1 sets - 5 reps - 10 second hold Seated Long Arc Quad - 2 x daily - 7 x weekly - 2 sets - 10 reps Standing Tandem Balance with Counter Support - 2 x daily - 7 x weekly - 1 sets - 3 reps - 20 second hold

## 2021-06-08 ENCOUNTER — Encounter (HOSPITAL_COMMUNITY): Payer: Medicare Other

## 2021-06-11 ENCOUNTER — Telehealth (HOSPITAL_COMMUNITY): Payer: Self-pay | Admitting: Physical Therapy

## 2021-06-11 ENCOUNTER — Encounter (HOSPITAL_COMMUNITY): Payer: Medicare Other | Admitting: Physical Therapy

## 2021-06-11 NOTE — Telephone Encounter (Signed)
Pt forgot to call transportation and she doesn't have a ride for today or 06/13/21.

## 2021-06-13 ENCOUNTER — Encounter (HOSPITAL_COMMUNITY): Payer: Medicare Other | Admitting: Physical Therapy

## 2021-06-18 ENCOUNTER — Ambulatory Visit (HOSPITAL_COMMUNITY): Payer: Medicare Other | Admitting: Physical Therapy

## 2021-06-18 ENCOUNTER — Other Ambulatory Visit: Payer: Self-pay

## 2021-06-18 ENCOUNTER — Encounter (HOSPITAL_COMMUNITY): Payer: Self-pay | Admitting: Physical Therapy

## 2021-06-18 DIAGNOSIS — R2689 Other abnormalities of gait and mobility: Secondary | ICD-10-CM

## 2021-06-18 DIAGNOSIS — R2681 Unsteadiness on feet: Secondary | ICD-10-CM

## 2021-06-18 DIAGNOSIS — M25562 Pain in left knee: Secondary | ICD-10-CM

## 2021-06-18 DIAGNOSIS — R262 Difficulty in walking, not elsewhere classified: Secondary | ICD-10-CM

## 2021-06-18 DIAGNOSIS — M6281 Muscle weakness (generalized): Secondary | ICD-10-CM

## 2021-06-18 DIAGNOSIS — M25662 Stiffness of left knee, not elsewhere classified: Secondary | ICD-10-CM

## 2021-06-18 NOTE — Patient Instructions (Signed)
Access Code: [email or web address] URL: [email or web address] Date: 06/18/2021 Prepared by: Mitzi Hansen Tiffeny Minchew  Exercises Supine Heel Slide with Strap - 3 x daily - 7 x weekly - 10 reps - 10 second hold
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
## 2021-06-20 ENCOUNTER — Ambulatory Visit (HOSPITAL_COMMUNITY): Payer: Medicare Other | Admitting: Physical Therapy

## 2021-06-22 ENCOUNTER — Telehealth (HOSPITAL_COMMUNITY): Payer: Self-pay

## 2021-06-22 ENCOUNTER — Encounter (HOSPITAL_COMMUNITY): Payer: Medicare Other

## 2021-06-22 NOTE — Telephone Encounter (Deleted)
No call, no show.  11:13 AM, 06/22/21 M. Sherlyn Lees, PT, DPT Physical Therapist- [place] Office Number: [phone]

## 2021-06-26 ENCOUNTER — Ambulatory Visit (HOSPITAL_COMMUNITY): Payer: Medicare Other | Admitting: Physical Therapy

## 2021-06-26 ENCOUNTER — Encounter (HOSPITAL_COMMUNITY): Payer: Self-pay | Admitting: Physical Therapy

## 2021-06-26 ENCOUNTER — Other Ambulatory Visit: Payer: Self-pay

## 2021-06-26 DIAGNOSIS — M25662 Stiffness of left knee, not elsewhere classified: Secondary | ICD-10-CM

## 2021-06-26 DIAGNOSIS — M25562 Pain in left knee: Secondary | ICD-10-CM

## 2021-06-26 DIAGNOSIS — R2689 Other abnormalities of gait and mobility: Secondary | ICD-10-CM

## 2021-06-26 DIAGNOSIS — R262 Difficulty in walking, not elsewhere classified: Secondary | ICD-10-CM

## 2021-06-26 DIAGNOSIS — M6281 Muscle weakness (generalized): Secondary | ICD-10-CM
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
## 2021-07-02 ENCOUNTER — Other Ambulatory Visit: Payer: Self-pay

## 2021-07-02 ENCOUNTER — Encounter (HOSPITAL_COMMUNITY): Payer: Self-pay

## 2021-07-02 ENCOUNTER — Ambulatory Visit (HOSPITAL_COMMUNITY): Payer: Medicare Other

## 2021-07-02 DIAGNOSIS — M25562 Pain in left knee: Secondary | ICD-10-CM

## 2021-07-02 DIAGNOSIS — M6281 Muscle weakness (generalized): Secondary | ICD-10-CM

## 2021-07-02 DIAGNOSIS — M25662 Stiffness of left knee, not elsewhere classified: Secondary | ICD-10-CM

## 2021-07-02 DIAGNOSIS — R262 Difficulty in walking, not elsewhere classified: Secondary | ICD-10-CM

## 2021-07-02 DIAGNOSIS — R2689 Other abnormalities of gait and mobility: Secondary | ICD-10-CM
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
## 2021-07-04 ENCOUNTER — Encounter (HOSPITAL_COMMUNITY): Payer: Self-pay | Admitting: Physical Therapy

## 2021-07-04 ENCOUNTER — Other Ambulatory Visit: Payer: Self-pay

## 2021-07-04 ENCOUNTER — Ambulatory Visit (HOSPITAL_COMMUNITY): Payer: Medicare Other | Admitting: Physical Therapy

## 2021-07-04 DIAGNOSIS — M25662 Stiffness of left knee, not elsewhere classified: Secondary | ICD-10-CM

## 2021-07-04 DIAGNOSIS — M25562 Pain in left knee: Secondary | ICD-10-CM

## 2021-07-04 DIAGNOSIS — R2689 Other abnormalities of gait and mobility: Secondary | ICD-10-CM

## 2021-07-04 DIAGNOSIS — R262 Difficulty in walking, not elsewhere classified: Secondary | ICD-10-CM

## 2021-07-04 DIAGNOSIS — M6281 Muscle weakness (generalized): Secondary | ICD-10-CM
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
## 2021-07-06 ENCOUNTER — Ambulatory Visit (HOSPITAL_COMMUNITY): Payer: Medicare Other

## 2021-07-09 ENCOUNTER — Ambulatory Visit (HOSPITAL_COMMUNITY): Payer: Medicare Other | Admitting: Physical Therapy

## 2021-07-11 ENCOUNTER — Other Ambulatory Visit: Payer: Self-pay

## 2021-07-11 ENCOUNTER — Ambulatory Visit (HOSPITAL_COMMUNITY): Payer: Medicare Other | Attending: Specialist | Admitting: Physical Therapy

## 2021-07-11 DIAGNOSIS — R262 Difficulty in walking, not elsewhere classified: Secondary | ICD-10-CM | POA: Insufficient documentation

## 2021-07-11 DIAGNOSIS — R2689 Other abnormalities of gait and mobility: Secondary | ICD-10-CM | POA: Diagnosis present

## 2021-07-11 DIAGNOSIS — M6281 Muscle weakness (generalized): Secondary | ICD-10-CM | POA: Insufficient documentation

## 2021-07-11 DIAGNOSIS — M25662 Stiffness of left knee, not elsewhere classified: Secondary | ICD-10-CM | POA: Diagnosis present

## 2021-07-11 DIAGNOSIS — M25562 Pain in left knee: Secondary | ICD-10-CM | POA: Diagnosis not present

## 2021-07-11 DIAGNOSIS — R2681 Unsteadiness on feet: Secondary | ICD-10-CM | POA: Insufficient documentation
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
## 2021-07-16 ENCOUNTER — Ambulatory Visit (HOSPITAL_COMMUNITY): Payer: Medicare Other | Admitting: Physical Therapy

## 2021-07-16 ENCOUNTER — Other Ambulatory Visit: Payer: Self-pay

## 2021-07-16 DIAGNOSIS — R2689 Other abnormalities of gait and mobility: Secondary | ICD-10-CM

## 2021-07-16 DIAGNOSIS — R2681 Unsteadiness on feet: Secondary | ICD-10-CM

## 2021-07-16 DIAGNOSIS — M25562 Pain in left knee: Secondary | ICD-10-CM | POA: Diagnosis not present

## 2021-07-16 DIAGNOSIS — M25662 Stiffness of left knee, not elsewhere classified: Secondary | ICD-10-CM

## 2021-07-16 DIAGNOSIS — R262 Difficulty in walking, not elsewhere classified: Secondary | ICD-10-CM

## 2021-07-16 DIAGNOSIS — M6281 Muscle weakness (generalized): Secondary | ICD-10-CM
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
## 2021-07-18 ENCOUNTER — Other Ambulatory Visit: Payer: Self-pay

## 2021-07-18 ENCOUNTER — Ambulatory Visit (HOSPITAL_COMMUNITY): Payer: Medicare Other | Admitting: Physical Therapy

## 2021-07-18 DIAGNOSIS — M25562 Pain in left knee: Secondary | ICD-10-CM

## 2021-07-18 DIAGNOSIS — R2689 Other abnormalities of gait and mobility: Secondary | ICD-10-CM

## 2021-07-18 DIAGNOSIS — M25662 Stiffness of left knee, not elsewhere classified: Secondary | ICD-10-CM

## 2021-07-18 DIAGNOSIS — M6281 Muscle weakness (generalized): Secondary | ICD-10-CM
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
## 2021-07-20 ENCOUNTER — Ambulatory Visit (HOSPITAL_COMMUNITY): Payer: Medicare Other | Admitting: Physical Therapy

## 2021-07-20 ENCOUNTER — Telehealth (HOSPITAL_COMMUNITY): Payer: Self-pay | Admitting: Physical Therapy

## 2021-07-20 NOTE — Telephone Encounter (Signed)
Pt did not show for appointment.  Called and left voicemail regarding missed and reminder for next scheduled appointment.   Teena Irani, PTA/CLT, Lissa Morales [phone]

## 2021-07-23 ENCOUNTER — Ambulatory Visit (HOSPITAL_COMMUNITY): Payer: Medicare Other | Admitting: Physical Therapy

## 2021-07-25 ENCOUNTER — Ambulatory Visit (HOSPITAL_COMMUNITY): Payer: Medicare Other | Admitting: Physical Therapy

## 2021-07-27 ENCOUNTER — Encounter (HOSPITAL_COMMUNITY): Payer: Medicare Other

## 2021-08-01 ENCOUNTER — Encounter (HOSPITAL_COMMUNITY): Payer: Medicare Other | Admitting: Physical Therapy

## 2021-08-01 ENCOUNTER — Ambulatory Visit (HOSPITAL_COMMUNITY): Payer: Medicare Other | Admitting: Physical Therapy

## 2021-08-03 ENCOUNTER — Ambulatory Visit (HOSPITAL_COMMUNITY): Payer: Medicare Other | Admitting: Physical Therapy

## 2021-08-07 ENCOUNTER — Ambulatory Visit (HOSPITAL_COMMUNITY): Payer: Medicare Other | Admitting: Physical Therapy

## 2021-08-24 ENCOUNTER — Encounter (HOSPITAL_COMMUNITY): Payer: Self-pay | Admitting: Physical Therapy

## 2021-08-24 NOTE — Therapy (Signed)
[place] °[facility] °[address] °[place], [place], [address] °Phone: [phone]   Fax:  [phone] ° °Patient Details  °Name: Joyce [name] °MRN: [id number] °Date of Birth: 11/08/1955 °Referring Provider:  No ref. provider found ° °Encounter Date: 08/24/2021 ° °PHYSICAL THERAPY DISCHARGE SUMMARY ° °Visits from Start of Care: 17 ° °Current functional level related to goals / functional outcomes: °Pt still on a walker states pain is a 9/10 °  °Remaining deficits: °ROM/ strength °  °Education / Equipment: °HEP  ° °Patient agrees to discharge. Patient goals were not met. Patient is being discharged due to not returning since the last visit.  °08/24/2021, 1:51 PM ° °[place] °[facility] °[address] °[place], [place], [address] °Phone: [phone]   Fax:  [phone] °

## 2021-12-08 ENCOUNTER — Emergency Department (HOSPITAL_COMMUNITY): Payer: Medicare Other

## 2021-12-08 ENCOUNTER — Other Ambulatory Visit: Payer: Self-pay

## 2021-12-08 ENCOUNTER — Inpatient Hospital Stay (HOSPITAL_COMMUNITY)
Admission: EM | Admit: 2021-12-08 | Discharge: 2021-12-10 | DRG: 682 | Disposition: A | Discharge status: Home Health | Payer: Medicare Other | Attending: Internal Medicine | Admitting: Internal Medicine

## 2021-12-08 ENCOUNTER — Encounter (HOSPITAL_COMMUNITY): Payer: Self-pay | Admitting: Emergency Medicine

## 2021-12-08 DIAGNOSIS — M549 Dorsalgia, unspecified: Secondary | ICD-10-CM | POA: Diagnosis present

## 2021-12-08 DIAGNOSIS — G9340 Encephalopathy, unspecified: Secondary | ICD-10-CM | POA: Diagnosis not present

## 2021-12-08 DIAGNOSIS — Z8673 Personal history of transient ischemic attack (TIA), and cerebral infarction without residual deficits: Secondary | ICD-10-CM

## 2021-12-08 DIAGNOSIS — N1832 Chronic kidney disease, stage 3b: Secondary | ICD-10-CM | POA: Diagnosis present

## 2021-12-08 DIAGNOSIS — I129 Hypertensive chronic kidney disease with stage 1 through stage 4 chronic kidney disease, or unspecified chronic kidney disease: Secondary | ICD-10-CM | POA: Diagnosis present

## 2021-12-08 DIAGNOSIS — Z8349 Family history of other endocrine, nutritional and metabolic diseases: Secondary | ICD-10-CM | POA: Diagnosis not present

## 2021-12-08 DIAGNOSIS — E785 Hyperlipidemia, unspecified: Secondary | ICD-10-CM | POA: Diagnosis present

## 2021-12-08 DIAGNOSIS — Z79891 Long term (current) use of opiate analgesic: Secondary | ICD-10-CM

## 2021-12-08 DIAGNOSIS — R531 Weakness: Secondary | ICD-10-CM | POA: Diagnosis not present

## 2021-12-08 DIAGNOSIS — Z79899 Other long term (current) drug therapy: Secondary | ICD-10-CM

## 2021-12-08 DIAGNOSIS — R519 Headache, unspecified: Secondary | ICD-10-CM

## 2021-12-08 DIAGNOSIS — Z96653 Presence of artificial knee joint, bilateral: Secondary | ICD-10-CM | POA: Diagnosis present

## 2021-12-08 DIAGNOSIS — N179 Acute kidney failure, unspecified: Principal | ICD-10-CM | POA: Diagnosis present

## 2021-12-08 DIAGNOSIS — N183 Chronic kidney disease, stage 3 unspecified: Secondary | ICD-10-CM | POA: Diagnosis present

## 2021-12-08 DIAGNOSIS — Z8249 Family history of ischemic heart disease and other diseases of the circulatory system: Secondary | ICD-10-CM | POA: Diagnosis not present

## 2021-12-08 DIAGNOSIS — E739 Lactose intolerance, unspecified: Secondary | ICD-10-CM | POA: Diagnosis present

## 2021-12-08 DIAGNOSIS — Z20822 Contact with and (suspected) exposure to covid-19: Secondary | ICD-10-CM | POA: Diagnosis present

## 2021-12-08 DIAGNOSIS — Z981 Arthrodesis status: Secondary | ICD-10-CM

## 2021-12-08 DIAGNOSIS — F17210 Nicotine dependence, cigarettes, uncomplicated: Secondary | ICD-10-CM | POA: Diagnosis present

## 2021-12-08 DIAGNOSIS — E669 Obesity, unspecified: Secondary | ICD-10-CM | POA: Diagnosis present

## 2021-12-08 DIAGNOSIS — J449 Chronic obstructive pulmonary disease, unspecified: Secondary | ICD-10-CM | POA: Diagnosis present

## 2021-12-08 DIAGNOSIS — I639 Cerebral infarction, unspecified: Principal | ICD-10-CM

## 2021-12-08 DIAGNOSIS — G9341 Metabolic encephalopathy: Secondary | ICD-10-CM | POA: Diagnosis present

## 2021-12-08 DIAGNOSIS — K219 Gastro-esophageal reflux disease without esophagitis: Secondary | ICD-10-CM | POA: Diagnosis present

## 2021-12-08 DIAGNOSIS — I10 Essential (primary) hypertension: Secondary | ICD-10-CM | POA: Diagnosis not present

## 2021-12-08 DIAGNOSIS — G8929 Other chronic pain: Secondary | ICD-10-CM | POA: Diagnosis present

## 2021-12-08 DIAGNOSIS — Z6835 Body mass index (BMI) 35.0-35.9, adult: Secondary | ICD-10-CM | POA: Diagnosis not present

## 2021-12-08 DIAGNOSIS — M5440 Lumbago with sciatica, unspecified side: Secondary | ICD-10-CM | POA: Diagnosis not present

## 2021-12-08 DIAGNOSIS — Z7901 Long term (current) use of anticoagulants: Secondary | ICD-10-CM

## 2021-12-08 DIAGNOSIS — R2990 Unspecified symptoms and signs involving the nervous system: Secondary | ICD-10-CM | POA: Diagnosis not present

## 2021-12-08 DIAGNOSIS — E782 Mixed hyperlipidemia: Secondary | ICD-10-CM | POA: Diagnosis not present

## 2021-12-08 DIAGNOSIS — Z886 Allergy status to analgesic agent status: Secondary | ICD-10-CM

## 2021-12-08 DIAGNOSIS — N189 Chronic kidney disease, unspecified: Secondary | ICD-10-CM

## 2021-12-08 LAB — RESP PANEL BY RT-PCR (FLU A&B, COVID) ARPGX2
Influenza A by PCR: NEGATIVE
Influenza B by PCR: NEGATIVE
SARS Coronavirus 2 by RT PCR: NEGATIVE

## 2021-12-08 LAB — I-STAT CHEM 8, ED
BUN: 55 mg/dL — ABNORMAL HIGH (ref 8–23)
Calcium, Ion: 0.95 mmol/L — ABNORMAL LOW (ref 1.15–1.40)
Chloride: 107 mmol/L (ref 98–111)
Creatinine, Ser: 3.8 mg/dL — ABNORMAL HIGH (ref 0.44–1.00)
Glucose, Bld: 90 mg/dL (ref 70–99)
HCT: 31 % — ABNORMAL LOW (ref 36.0–46.0)
Hemoglobin: 10.5 g/dL — ABNORMAL LOW (ref 12.0–15.0)
Potassium: 4.2 mmol/L (ref 3.5–5.1)
Sodium: 142 mmol/L (ref 135–145)
TCO2: 23 mmol/L (ref 22–32)

## 2021-12-08 LAB — COMPREHENSIVE METABOLIC PANEL
ALT: 13 U/L (ref 0–44)
AST: 19 U/L (ref 15–41)
Albumin: 3.5 g/dL (ref 3.5–5.0)
Alkaline Phosphatase: 91 U/L (ref 38–126)
Anion gap: 10 (ref 5–15)
BUN: 62 mg/dL — ABNORMAL HIGH (ref 8–23)
CO2: 25 mmol/L (ref 22–32)
Calcium: 8 mg/dL — ABNORMAL LOW (ref 8.9–10.3)
Chloride: 107 mmol/L (ref 98–111)
Creatinine, Ser: 3.5 mg/dL — ABNORMAL HIGH (ref 0.44–1.00)
GFR, Estimated: 14 mL/min — ABNORMAL LOW (ref >60)
Glucose, Bld: 97 mg/dL (ref 70–99)
Potassium: 4.2 mmol/L (ref 3.5–5.1)
Sodium: 142 mmol/L (ref 135–145)
Total Bilirubin: 0.4 mg/dL (ref 0.3–1.2)
Total Protein: 7.1 g/dL (ref 6.5–8.1)

## 2021-12-08 LAB — DIFFERENTIAL
Abs Immature Granulocytes: 0.03 10*3/uL (ref 0.00–0.07)
Basophils Absolute: 0 10*3/uL (ref 0.0–0.1)
Basophils Relative: 1 %
Eosinophils Absolute: 0.6 10*3/uL — ABNORMAL HIGH (ref 0.0–0.5)
Eosinophils Relative: 7 %
Immature Granulocytes: 0 %
Lymphocytes Relative: 25 %
Lymphs Abs: 2.1 10*3/uL (ref 0.7–4.0)
Monocytes Absolute: 1 10*3/uL (ref 0.1–1.0)
Monocytes Relative: 12 %
Neutro Abs: 4.6 10*3/uL (ref 1.7–7.7)
Neutrophils Relative %: 55 %

## 2021-12-08 LAB — URINALYSIS, ROUTINE W REFLEX MICROSCOPIC
Bilirubin Urine: NEGATIVE
Glucose, UA: NEGATIVE mg/dL
Hgb urine dipstick: NEGATIVE
Ketones, ur: NEGATIVE mg/dL
Leukocytes,Ua: NEGATIVE
Nitrite: NEGATIVE
Protein, ur: 30 mg/dL — AB
Specific Gravity, Urine: 1.02 (ref 1.005–1.030)
pH: 6 (ref 5.0–8.0)

## 2021-12-08 LAB — CBC
HCT: 31.5 % — ABNORMAL LOW (ref 36.0–46.0)
Hemoglobin: 9.7 g/dL — ABNORMAL LOW (ref 12.0–15.0)
MCH: 25.3 pg — ABNORMAL LOW (ref 26.0–34.0)
MCHC: 30.8 g/dL (ref 30.0–36.0)
MCV: 82 fL (ref 80.0–100.0)
Platelets: 313 10*3/uL (ref 150–400)
RBC: 3.84 MIL/uL — ABNORMAL LOW (ref 3.87–5.11)
RDW: 15.7 % — ABNORMAL HIGH (ref 11.5–15.5)
WBC: 8.3 10*3/uL (ref 4.0–10.5)
nRBC: 0 % (ref 0.0–0.2)

## 2021-12-08 LAB — RAPID URINE DRUG SCREEN, HOSP PERFORMED
Amphetamines: NOT DETECTED
Barbiturates: NOT DETECTED
Benzodiazepines: NOT DETECTED
Cocaine: POSITIVE — AB
Opiates: NOT DETECTED
Tetrahydrocannabinol: NOT DETECTED

## 2021-12-08 LAB — PROTIME-INR
INR: 1 (ref 0.8–1.2)
Prothrombin Time: 13 seconds (ref 11.4–15.2)

## 2021-12-08 LAB — ETHANOL: Alcohol, Ethyl (B): <10 mg/dL (ref <10)

## 2021-12-08 LAB — APTT: aPTT: 27 seconds (ref 24–36)

## 2021-12-08 LAB — CBG MONITORING, ED: Glucose-Capillary: 91 mg/dL (ref 70–99)

## 2021-12-08 MED ORDER — SODIUM CHLORIDE 0.9 % IV SOLN: INTRAVENOUS | Status: DC

## 2021-12-08 MED ORDER — IOHEXOL 350 MG/ML SOLN
60.0000 mL | Freq: Once | INTRAVENOUS | Status: AC | PRN
  Administered 2021-12-08: 60 mL via INTRAVENOUS

## 2021-12-08 MED ORDER — SODIUM CHLORIDE 0.9 % IV BOLUS
500.0000 mL | Freq: Once | INTRAVENOUS | Status: AC
  Administered 2021-12-08: 500 mL via INTRAVENOUS

## 2021-12-08 MED ORDER — DIPHENHYDRAMINE HCL 50 MG/ML IJ SOLN
25.0000 mg | Freq: Once | INTRAMUSCULAR | Status: AC
  Administered 2021-12-08: 25 mg via INTRAVENOUS
  Filled 2021-12-08: qty 1

## 2021-12-08 MED ORDER — STROKE: EARLY STAGES OF RECOVERY BOOK
Freq: Once | Status: AC
  Filled 2021-12-08 (×2): qty 1

## 2021-12-08 MED ORDER — METOCLOPRAMIDE HCL 5 MG/ML IJ SOLN
10.0000 mg | Freq: Once | INTRAMUSCULAR | Status: AC
  Administered 2021-12-08: 10 mg via INTRAVENOUS
  Filled 2021-12-08: qty 2

## 2021-12-08 MED ORDER — ACETAMINOPHEN 650 MG RE SUPP: 650.0000 mg | RECTAL | Status: DC | PRN

## 2021-12-08 MED ORDER — PROCHLORPERAZINE EDISYLATE 10 MG/2ML IJ SOLN
5.0000 mg | Freq: Once | INTRAMUSCULAR | Status: AC
  Administered 2021-12-08: 5 mg via INTRAVENOUS
  Filled 2021-12-08: qty 2

## 2021-12-08 MED ORDER — ACETAMINOPHEN 160 MG/5ML PO SOLN: 650.0000 mg | ORAL | Status: DC | PRN

## 2021-12-08 MED ORDER — SENNOSIDES-DOCUSATE SODIUM 8.6-50 MG PO TABS: 1.0000 | ORAL_TABLET | Freq: Every evening | ORAL | Status: DC | PRN

## 2021-12-08 MED ORDER — HYDROMORPHONE HCL 1 MG/ML IJ SOLN
0.5000 mg | Freq: Once | INTRAMUSCULAR | Status: AC
  Administered 2021-12-08: 0.5 mg via INTRAVENOUS
  Filled 2021-12-08: qty 0.5

## 2021-12-08 MED ORDER — ACETAMINOPHEN 325 MG PO TABS: 650.0000 mg | ORAL_TABLET | ORAL | Status: DC | PRN

## 2021-12-08 MED ORDER — ENOXAPARIN SODIUM 30 MG/0.3ML IJ SOSY
30.0000 mg | PREFILLED_SYRINGE | INTRAMUSCULAR | Status: DC
  Administered 2021-12-08: 30 mg via SUBCUTANEOUS
  Filled 2021-12-08: qty 0.3

## 2021-12-08 MED ORDER — HYDRALAZINE HCL 20 MG/ML IJ SOLN
10.0000 mg | Freq: Four times a day (QID) | INTRAMUSCULAR | Status: DC | PRN
  Administered 2021-12-10: 10 mg via INTRAVENOUS
  Filled 2021-12-08: qty 1

## 2021-12-08 MED ORDER — HYDROMORPHONE HCL 1 MG/ML IJ SOLN
1.0000 mg | INTRAMUSCULAR | Status: DC | PRN
  Administered 2021-12-09: 1 mg via INTRAVENOUS
  Filled 2021-12-08: qty 1

## 2021-12-08 NOTE — ED Provider Notes (Signed)
?[facility] ?Provider Note ? ? ?CSN: [id number] ?Arrival date & time: 12/08/21  1325 ? ?  ? ?History ? ?Chief Complaint  ?Patient presents with  ? Code Stroke  ? ? ?Joyce [name] is a 66 y.o. female. ? ?Patient arrives via EMS as codes stroke activation.  Pt reportedly last normal/at baseline at 1130 AM today by family, and then was noted to have left sided weakness, slurred/altered speech, and also c/o headache. Symptoms acute onset, moderate, persistent. No report of trauma or fall. No fever. No chest pain or sob. No abd pain or nv. Pt limited historian - level 5 caveat. EMS CBG 126.  ? ?The history is provided by the patient, the EMS personnel and medical records. The history is limited by the condition of the patient.  ? ?  ? ?Home Medications ?Prior to Admission medications   ?Medication Sig Start Date End Date Taking? Authorizing Provider  ?amLODipine (NORVASC) 10 MG tablet Take 10 mg by mouth daily.    [provider]  ?apixaban (ELIQUIS) 2.5 MG TABS tablet Take 1 tablet (2.5 mg total) by mouth every 12 (twelve) hours. 03/30/21   Cecilie Kicks, PA-C  ?Ascorbic Acid (VITAMIN C) 100 MG tablet Take 100 mg by mouth daily.    [provider]  ?atorvastatin (LIPITOR) 10 MG tablet Take 10 mg by mouth every evening.  03/01/19   [provider]  ?cephALEXin (KEFLEX) 500 MG capsule Take 1 capsule (500 mg total) by mouth 3 (three) times daily. 02/26/21   Hayden Rasmussen, MD  ?Cholecalciferol (DIALYVITE VITAMIN D 5000) 125 MCG (5000 UT) capsule Take 5,000 Units by mouth daily.    [provider]  ?cloNIDine (CATAPRES - DOSED IN MG/24 HR) 0.2 mg/24hr patch Place 1 patch (0.2 mg total) onto the skin once a week. ?Patient taking differently: Place 0.2 mg onto the skin every Monday. 02/03/18   Lucia Gaskins, MD  ?diclofenac Sodium (VOLTAREN) 1 % GEL Apply 1 application topically 4 (four) times daily as needed (pain).    [provider]  ?docusate sodium  (COLACE) 100 MG capsule Take 1 capsule (100 mg total) by mouth 2 (two) times daily. 03/30/21   Cecilie Kicks, PA-C  ?gabapentin (NEURONTIN) 300 MG capsule Take 1-2 capsules (300-600 mg total) by mouth See admin instructions. Take 300 mg in the afternoon, 300 mg in the evening, and 600 mg at bedtime 08/02/19   Johnson, Clanford L, MD  ?HYDROmorphone (DILAUDID) 2 MG tablet Take 1-2 tablets (2-4 mg total) by mouth every 4 (four) hours as needed for severe pain (1 - 2 TABLETS Q 4H PRN PAIN). 03/30/21   Cecilie Kicks, PA-C  ?labetalol (NORMODYNE) 300 MG tablet Take 1 tablet (300 mg total) by mouth 3 (three) times daily. 01/29/18   Lucia Gaskins, MD  ?lisinopril-hydrochlorothiazide (ZESTORETIC) 20-12.5 MG tablet Take by mouth See admin instructions. Take 12.5 mg daily    [provider]  ?Menthol (ICY HOT BACK EXTRA STRENGTH) 5 % PTCH Apply 1 patch topically daily as needed (pain).    [provider]  ?methocarbamol (ROBAXIN) 500 MG tablet Take 1 tablet (500 mg total) by mouth every 6 (six) hours as needed for muscle spasms. 03/30/21   Cecilie Kicks, PA-C  ?nystatin cream (MYCOSTATIN) Apply to affected area 2 times daily ?Patient taking differently: Apply 1 application topically 2 (two) times daily as needed (pain). 05/31/19   Wieters, Hallie C, PA-C  ?oxyCODONE-acetaminophen (PERCOCET) 10-325 MG tablet Take  1 tablet by mouth every 6 (six) hours as needed for pain.    [provider]  ?oxymetazoline (AFRIN) 0.05 % nasal spray Place 1 spray into both nostrils 2 (two) times daily as needed for congestion.    [provider]  ?polyethylene glycol (MIRALAX / GLYCOLAX) 17 g packet Take 17 g by mouth daily. 03/31/21   Cecilie Kicks, PA-C  ?simvastatin (ZOCOR) 40 MG tablet Take 40 mg by mouth daily at 6 PM.    [provider]  ?Tetrahydrozoline-Zn Sulfate (EYE DROPS IRRITATION RELIEF OP) Apply 1 drop to eye 2 (two) times daily as needed (allergies/irritation).     [provider]  ?traZODone (DESYREL) 50 MG tablet Take 50 mg by mouth at bedtime. 06/19/19   [provider]  ?zinc gluconate 50 MG tablet Take 50 mg by mouth daily.    [provider]  ?   ? ?Allergies    ?Lactose intolerance (gi), Aspirin, and Other   ? ?Review of Systems   ?Review of Systems  ?Constitutional:  Negative for fever.  ?HENT:  Negative for trouble swallowing.   ?Eyes:  Negative for pain.  ?Respiratory:  Negative for shortness of breath.   ?Cardiovascular:  Negative for chest pain.  ?Gastrointestinal:  Negative for abdominal pain and vomiting.  ?Genitourinary:  Negative for flank pain.  ?Musculoskeletal:  Negative for neck pain and neck stiffness.  ?Skin:  Negative for rash.  ?Neurological:  Positive for speech difficulty, weakness and headaches.  ?Hematological:  Does not bruise/bleed easily.  ?Psychiatric/Behavioral:  Negative for agitation.   ? ?Physical Exam ?Updated Vital Signs ?BP (!) 154/86   Pulse 70   Resp 20   Ht 1.702 m ('5\' 7"'$ )   Wt 103.3 kg   LMP 12/04/1994   SpO2 100%   BMI 35.67 kg/m?  ?Physical Exam ?Vitals and nursing note reviewed.  ?Constitutional:   ?   Appearance: Normal appearance. She is well-developed.  ?HENT:  ?   Head: Atraumatic.  ?   Nose: Nose normal.  ?   Mouth/Throat:  ?   Mouth: Mucous membranes are moist.  ?Eyes:  ?   General: No scleral icterus. ?   Conjunctiva/sclera: Conjunctivae normal.  ?   Pupils: Pupils are equal, round, and reactive to light.  ?Neck:  ?   Vascular: No carotid bruit.  ?   Trachea: No tracheal deviation.  ?Cardiovascular:  ?   Rate and Rhythm: Normal rate and regular rhythm.  ?   Pulses: Normal pulses.  ?   Heart sounds: Normal heart sounds. No murmur heard. ?  No friction rub. No gallop.  ?Pulmonary:  ?   Effort: Pulmonary effort is normal. No respiratory distress.  ?   Breath sounds: Normal breath sounds.  ?Abdominal:  ?   General: Bowel sounds are normal. There is no distension.  ?   Palpations: Abdomen is  soft.  ?   Tenderness: There is no abdominal tenderness. There is no guarding.  ?Genitourinary: ?   Comments: No cva tenderness.  ?Musculoskeletal:     ?   General: No swelling.  ?   Cervical back: Normal range of motion and neck supple. No rigidity. No muscular tenderness.  ?   Comments: C spine non tender.   ?Skin: ?   General: Skin is warm and dry.  ?   Findings: No rash.  ?Neurological:  ?   Mental Status: She is alert.  ?   Comments: Alert, speech dysarthric. ?left facial weakness.  Left sided weakness, 2/5. Right side also weak 3/5 ?effort.   ?Psychiatric:     ?   Mood and Affect: Mood normal.  ? ? ?ED Results / Procedures / Treatments   ?Labs ?(all labs ordered are listed, but only abnormal results are displayed) ?Results for orders placed or performed during the hospital encounter of 12/08/21  ?Ethanol  ?Result Value Ref Range  ? Alcohol, Ethyl (B) <10 <10 mg/dL  ?Protime-INR  ?Result Value Ref Range  ? Prothrombin Time 13.0 11.4 - 15.2 seconds  ? INR 1.0 0.8 - 1.2  ?APTT  ?Result Value Ref Range  ? aPTT 27 24 - 36 seconds  ?CBC  ?Result Value Ref Range  ? WBC 8.3 4.0 - 10.5 K/uL  ? RBC 3.84 (L) 3.87 - 5.11 MIL/uL  ? Hemoglobin 9.7 (L) 12.0 - 15.0 g/dL  ? HCT 31.5 (L) 36.0 - 46.0 %  ? MCV 82.0 80.0 - 100.0 fL  ? MCH 25.3 (L) 26.0 - 34.0 pg  ? MCHC 30.8 30.0 - 36.0 g/dL  ? RDW 15.7 (H) 11.5 - 15.5 %  ? Platelets 313 150 - 400 K/uL  ? nRBC 0.0 0.0 - 0.2 %  ?Differential  ?Result Value Ref Range  ? Neutrophils Relative % 55 %  ? Neutro Abs 4.6 1.7 - 7.7 K/uL  ? Lymphocytes Relative 25 %  ? Lymphs Abs 2.1 0.7 - 4.0 K/uL  ? Monocytes Relative 12 %  ? Monocytes Absolute 1.0 0.1 - 1.0 K/uL  ? Eosinophils Relative 7 %  ? Eosinophils Absolute 0.6 (H) 0.0 - 0.5 K/uL  ? Basophils Relative 1 %  ? Basophils Absolute 0.0 0.0 - 0.1 K/uL  ? Immature Granulocytes 0 %  ? Abs Immature Granulocytes 0.03 0.00 - 0.07 K/uL  ?Comprehensive metabolic panel  ?Result Value Ref Range  ? Sodium 142 135 - 145 mmol/L  ? Potassium 4.2 3.5 -  5.1 mmol/L  ? Chloride 107 98 - 111 mmol/L  ? CO2 25 22 - 32 mmol/L  ? Glucose, Bld 97 70 - 99 mg/dL  ? BUN 62 (H) 8 - 23 mg/dL  ? Creatinine, Ser 3.50 (H) 0.44 - 1.00 mg/dL  ? Calcium 8.0 (L) 8.9 - 10.3 mg/dL  ? Total P

## 2021-12-08 NOTE — Consult Note (Signed)
MRS 0 
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
## 2021-12-08 NOTE — Plan of Care (Signed)
?  Problem: Education: ?Goal: Knowledge of General Education information will improve ?Description: Including pain rating scale, medication(s)/side effects and non-pharmacologic comfort measures ?Outcome: Not Progressing ?  ?Problem: Health Behavior/Discharge Planning: ?Goal: Ability to manage health-related needs will improve ?Outcome: Not Progressing ?  ?Problem: Clinical Measurements: ?Goal: Ability to maintain clinical measurements within normal limits will improve ?Outcome: Progressing ?Goal: Will remain free from infection ?Outcome: Progressing ?Goal: Diagnostic test results will improve ?Outcome: Progressing ?Goal: Respiratory complications will improve ?Outcome: Progressing ?Goal: Cardiovascular complication will be avoided ?Outcome: Progressing ?  ?Problem: Activity: ?Goal: Risk for activity intolerance will decrease ?Outcome: Not Progressing ?  ?Problem: Nutrition: ?Goal: Adequate nutrition will be maintained ?Outcome: Not Progressing ?  ?Problem: Coping: ?Goal: Level of anxiety will decrease ?Outcome: Not Progressing ?  ?Problem: Elimination: ?Goal: Will not experience complications related to bowel motility ?Outcome: Progressing ?Goal: Will not experience complications related to urinary retention ?Outcome: Progressing ?  ?Problem: Pain Managment: ?Goal: General experience of comfort will improve ?Outcome: Progressing ?  ?Problem: Safety: ?Goal: Ability to remain free from injury will improve ?Outcome: Progressing ?  ?Problem: Skin Integrity: ?Goal: Risk for impaired skin integrity will decrease ?Outcome: Progressing ?  ?Problem: Education: ?Goal: Knowledge of disease or condition will improve ?Outcome: Not Progressing ?Goal: Knowledge of secondary prevention will improve (SELECT ALL) ?Outcome: Not Progressing ?Goal: Knowledge of patient specific risk factors will improve (INDIVIDUALIZE FOR PATIENT) ?Outcome: Not Progressing ?  ?

## 2021-12-08 NOTE — Progress Notes (Signed)
1319 call time ?1320 beeper time ?1336 exam started ?1338 exam finished ?1338 images sent to soc ?1342 exam completed in epic ?1342 [facility] called ?

## 2021-12-08 NOTE — Consult Note (Signed)
Code stroke activated by cart push button at 1321 ?
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
## 2021-12-08 NOTE — ED Triage Notes (Signed)
Pt to the ED with [facility] with a code stroke last known well was 1130 today. ? ?Pt has left side weakness, slurred speech, and a sudden onset headache. ? ?

## 2021-12-08 NOTE — Consult Note (Signed)
Dr Rory Percy on screen at 1322 to evaluate patient.  ?

## 2021-12-09 ENCOUNTER — Inpatient Hospital Stay (HOSPITAL_COMMUNITY): Payer: Medicare Other

## 2021-12-09 DIAGNOSIS — R531 Weakness: Secondary | ICD-10-CM | POA: Diagnosis not present

## 2021-12-09 DIAGNOSIS — I10 Essential (primary) hypertension: Secondary | ICD-10-CM

## 2021-12-09 DIAGNOSIS — R2990 Unspecified symptoms and signs involving the nervous system: Secondary | ICD-10-CM | POA: Diagnosis not present

## 2021-12-09 LAB — LIPID PANEL
Cholesterol: 221 mg/dL — ABNORMAL HIGH (ref 0–200)
HDL: 50 mg/dL (ref >40)
LDL Cholesterol: 154 mg/dL — ABNORMAL HIGH (ref 0–99)
Total CHOL/HDL Ratio: 4.4 RATIO
Triglycerides: 86 mg/dL (ref <150)
VLDL: 17 mg/dL (ref 0–40)

## 2021-12-09 LAB — ECHOCARDIOGRAM COMPLETE
Area-P 1/2: 4.36 cm2
Height: 67 in
S' Lateral: 2.3 cm
Weight: 3643.76 oz

## 2021-12-09 LAB — RENAL FUNCTION PANEL
Albumin: 3.2 g/dL — ABNORMAL LOW (ref 3.5–5.0)
Anion gap: 11 (ref 5–15)
BUN: 49 mg/dL — ABNORMAL HIGH (ref 8–23)
CO2: 21 mmol/L — ABNORMAL LOW (ref 22–32)
Calcium: 8.4 mg/dL — ABNORMAL LOW (ref 8.9–10.3)
Chloride: 110 mmol/L (ref 98–111)
Creatinine, Ser: 2.26 mg/dL — ABNORMAL HIGH (ref 0.44–1.00)
GFR, Estimated: 23 mL/min — ABNORMAL LOW (ref >60)
Glucose, Bld: 80 mg/dL (ref 70–99)
Phosphorus: 4.7 mg/dL — ABNORMAL HIGH (ref 2.5–4.6)
Potassium: 4.1 mmol/L (ref 3.5–5.1)
Sodium: 142 mmol/L (ref 135–145)

## 2021-12-09 LAB — CBC
HCT: 35.4 % — ABNORMAL LOW (ref 36.0–46.0)
Hemoglobin: 10.3 g/dL — ABNORMAL LOW (ref 12.0–15.0)
MCH: 24.6 pg — ABNORMAL LOW (ref 26.0–34.0)
MCHC: 29.1 g/dL — ABNORMAL LOW (ref 30.0–36.0)
MCV: 84.7 fL (ref 80.0–100.0)
Platelets: 295 10*3/uL (ref 150–400)
RBC: 4.18 MIL/uL (ref 3.87–5.11)
RDW: 15.6 % — ABNORMAL HIGH (ref 11.5–15.5)
WBC: 6.1 10*3/uL (ref 4.0–10.5)
nRBC: 0 % (ref 0.0–0.2)

## 2021-12-09 LAB — COMPREHENSIVE METABOLIC PANEL
ALT: 10 U/L (ref 0–44)
AST: 19 U/L (ref 15–41)
Albumin: 3 g/dL — ABNORMAL LOW (ref 3.5–5.0)
Alkaline Phosphatase: 72 U/L (ref 38–126)
Anion gap: 8 (ref 5–15)
BUN: 53 mg/dL — ABNORMAL HIGH (ref 8–23)
CO2: 24 mmol/L (ref 22–32)
Calcium: 8.2 mg/dL — ABNORMAL LOW (ref 8.9–10.3)
Chloride: 111 mmol/L (ref 98–111)
Creatinine, Ser: 2.69 mg/dL — ABNORMAL HIGH (ref 0.44–1.00)
GFR, Estimated: 19 mL/min — ABNORMAL LOW (ref >60)
Glucose, Bld: 81 mg/dL (ref 70–99)
Potassium: 4.1 mmol/L (ref 3.5–5.1)
Sodium: 143 mmol/L (ref 135–145)
Total Bilirubin: 0.7 mg/dL (ref 0.3–1.2)
Total Protein: 6 g/dL — ABNORMAL LOW (ref 6.5–8.1)

## 2021-12-09 MED ORDER — OXYCODONE-ACETAMINOPHEN 5-325 MG PO TABS
1.0000 | ORAL_TABLET | Freq: Four times a day (QID) | ORAL | Status: DC | PRN
  Administered 2021-12-09 – 2021-12-10 (×3): 1 via ORAL
  Filled 2021-12-09 (×3): qty 1

## 2021-12-09 MED ORDER — SODIUM CHLORIDE 0.9 % IV SOLN: INTRAVENOUS | Status: DC

## 2021-12-09 MED ORDER — OXYCODONE HCL 5 MG PO TABS
5.0000 mg | ORAL_TABLET | Freq: Four times a day (QID) | ORAL | Status: DC | PRN
  Administered 2021-12-09 – 2021-12-10 (×3): 5 mg via ORAL
  Filled 2021-12-09 (×3): qty 1

## 2021-12-09 NOTE — Hospital Course (Signed)
Joyce [name] is a 66 y.o. female with medical history significant of Arthritis with chronic back pain on chronic opiates, hypertension, hyperlipidemia, history of stroke, chronic kidney disease stage III. ?Presents with left-sided weakness, headache and slurred speech.  Also reported injury to left shoulder.  MRI negative for acute stroke.  Neurology consulted but currently signed off.  Cocaine positive. ?

## 2021-12-09 NOTE — Progress Notes (Signed)
?  Progress Note ?Patient: Joyce [name] EYC:[id number] DOB: [birth year] DOA: 12/08/2021  ?DOS: the patient was seen and examined on 12/09/2021 ? ?Brief hospital course: ?Joyce [name] is a 66 y.o. female with medical history significant of Arthritis with chronic back pain on chronic opiates, hypertension, hyperlipidemia, history of stroke, chronic kidney disease stage III. ?Presents with left-sided weakness, headache and slurred speech.  Also reported injury to left shoulder.  MRI negative for acute stroke.  Neurology consulted but currently signed off.  Cocaine positive. ?Assessment and Plan: ?Principal Problem: ?  Acute left-sided weakness ?TIA or stroke ruled out. ?Appreciate neuro consultation. ?MRI brain negative for any acute abnormality. ?CTA and CT head are unremarkable for any significant abnormality as well. ?Patient continues to exhibit left-sided weakness. ?Unsure whether this is musculoskeletal pain related limitation or neurological limitation. ?Follow-up on PT OT consultation. ?If does not improve, will perform MRI C-spine without contrast to rule out any acute abnormality. ? ?  Essential hypertension, benign ?Blood pressure mildly elevated. ?Avoid selective beta-blockers due to cocaine positive. ? ?  Hyperlipidemia ?Continue statin. ? ?  Chronic back pain ?On chronic Percocet.  We will continue. ? ?  Acute renal failure superimposed on stage 3 chronic kidney disease ([place]) ?From poor p.o. intake. ?Renal function improving.  Ultrasound renal negative for any obstruction.  Avoid nephrotoxic medication.  Not a good candidate for resuming losartan HCTZ combination on discharge. ?Monitor. ? ?  Acute encephalopathy ?Most likely metabolic in nature. ?MRI brain negative for ?No evidence of acute infection. ?Monitor. ? ?Obesity. ?Body mass index is 35.67 kg/m?Marland Kitchen  ?Placing the patient at high risk for poor outcome. ?  ?Subjective: Continues to report left-sided pain in the shoulder worsening with breathing.  Continues to  report left-sided weakness in the leg. ? ?Physical Exam: ?Vitals:  ? 12/09/21 0400 12/09/21 0805 12/09/21 1135 12/09/21 1629  ?BP: (!) 129/48 (!) 168/74 (!) 184/64 (!) 179/80  ?Pulse: (!) 58 (!) 51  63  ?Resp: '13 13 16 16  '$ ?Temp: (!) 97.5 ?F (36.4 ?C) 98.1 ?F (36.7 ?C) 97.7 ?F (36.5 ?C) 97.8 ?F (36.6 ?C)  ?TempSrc: Oral Oral Oral Oral  ?SpO2: 99% 100% 100% 100%  ?Weight:      ?Height:      ? ?General: Appear in mild distress; no visible Abnormal Neck Mass Or lumps, Conjunctiva normal ?Cardiovascular: S1 and S2 Present, no Murmur, ?Respiratory: good respiratory effort, Bilateral Air entry present and CTA, no Crackles, no wheezes ?Abdomen: Bowel Sound present, Non tender  ?Extremities: no Pedal edema ?Neurology: alert and oriented to time, place, and person, left upper and lower extremity weakness. ?Gait not checked due to patient safety concerns  ? ?Data Reviewed: ?I have Reviewed nursing notes, Vitals, and Lab results since pt's last encounter. Pertinent lab results CBC and BMP ?I have ordered test including CBC and BMP, x-ray shoulder, ultrasound renal ?I have discussed pt's care plan and test results with neurology.  ? ?Family Communication: None at bedside ? ?Disposition: ?Status is: Inpatient ?Remains inpatient appropriate because: Continuing IV hydration for AKI.  Renal function still at baseline.  Still unable to ambulate. ? ?Author: ?Berle Mull, MD ?12/09/2021 5:32 PM ? ?Please look on [email or web address] to find out who is on call. ?

## 2021-12-09 NOTE — Progress Notes (Signed)
?  Echocardiogram ?2D Echocardiogram has been performed. ? ?Joyce [name] ?12/09/2021, 10:07 AM ?

## 2021-12-09 NOTE — Progress Notes (Addendum)
Neurology Progress Note ? ? ?S:// ?Patient sitting on the side of the bed. Just finished working with PT. Family present @ bedside. Patient states she has some improvement in her symptoms, still with left side weakness and numbness. Speech is improved.  ? ? ?O:// ?Current vital signs: ?BP (!) 184/64 (BP Location: Right Arm)   Pulse (!) 51   Temp 97.7 ?F (36.5 ?C) (Oral)   Resp 16   Ht '5\' 7"'$  (1.702 m)   Wt 103.3 kg   LMP 12/04/1994   SpO2 100%   BMI 35.67 kg/m?  ?Vital signs in last 24 hours: ?Temp:  [97.5 ?F (36.4 ?C)-98.1 ?F (36.7 ?C)] 97.7 ?F (36.5 ?C) (05/07 1135) ?Pulse Rate:  [51-73] 51 (05/07 0805) ?Resp:  [12-28] 16 (05/07 1135) ?BP: (129-184)/(48-81) 184/64 (05/07 1135) ?SpO2:  [97 %-100 %] 100 % (05/07 1135) ? ?GENERAL: Awake, alert in NAD ?HEENT: - Normocephalic and atraumatic, dry mm ?LUNGS - Clear to auscultation bilaterally with no wheezes ?CV - S1S2 RRR, no m/r/g, equal pulses bilaterally. ?ABDOMEN - Soft, nontender, nondistended with normoactive BS ?Ext: warm, well perfused, intact peripheral pulses, no edema ? ?NEURO:  ?Mental Status: AA&Ox4 ?Language: speech is clear  Naming, repetition, fluency, and comprehension intact. ?Cranial Nerves: PERRL 3 mm/brisk. EOMI, visual fields full, no facial asymmetry, facial sensation intact, hearing intact, tongue/uvula/soft palate midline, normal sternocleidomastoid and trapezius muscle strength. No evidence of tongue atrophy or fibrillations ?Motor: generalized weakness. Poor effort with exam. All extremities are antigravity, left side appears to be weaker than right side. Left arm limited movement due to c/o forearm/elbow and shoulder pain which is new.  Both upper extremities have a drift right is able to be held up longer than left before drifting to bed. Bilateral lowers appear symmetric as they both drift down to bed equally ?Tone: is normal and bulk is normal ?Sensation- Left side decreased  ?Coordination: appears to be intact, however difficult to  assess due to pain in left upper  extremity  ?Gait- deferred ? ? ?Medications ? ?Current Facility-Administered Medications:  ?  0.9 %  sodium chloride infusion, , Intravenous, Continuous, Joyce Hamman, MD, Last Rate: 100 mL/hr at 12/09/21 1110, New Bag at 12/09/21 1110 ?  acetaminophen (TYLENOL) tablet 650 mg, 650 mg, Oral, Q4H PRN **OR** acetaminophen (TYLENOL) 160 MG/5ML solution 650 mg, 650 mg, Per Tube, Q4H PRN **OR** acetaminophen (TYLENOL) suppository 650 mg, 650 mg, Rectal, Q4H PRN, Joyce Mainland, DO ?  hydrALAZINE (APRESOLINE) injection 10 mg, 10 mg, Intravenous, Q6H PRN, Stinson, Jacob J, DO ?  oxyCODONE-acetaminophen (PERCOCET/ROXICET) 5-325 MG per tablet 1 tablet, 1 tablet, Oral, Q6H PRN, 1 tablet at 12/09/21 1304 **AND** oxyCODONE (Oxy IR/ROXICODONE) immediate release tablet 5 mg, 5 mg, Oral, Q6H PRN, Joyce Hamman, MD, 5 mg at 12/09/21 1304 ?  senna-docusate (Senokot-S) tablet 1 tablet, 1 tablet, Oral, QHS PRN, Joyce Mainland, DO ? ?Imaging ?I have reviewed images in epic and the results pertinent to this consultation are: ? ?Code stroke CT-scan of the brain 5/6: ?1. No acute CT finding. Old small vessel infarction inferior cerebellum on the left. ?2. ASPECTS is 10. ? ?Code stroke CTA H/N 5/6: ?-Soft plaque at both carotid bifurcations but no stenosis. ?-No intracranial large vessel occlusion. ?-50% stenosis at each vertebral artery origin ? ?MRI examination of the brain 5/7: ?1. No acute intracranial abnormality. ?2. Old left cerebellar infarct and chronic small vessel ischemia ? ?LABS: ?LDL-c 154 ?A1c pending  ?UDS +cocaine  ?UA negative  ?  HGB 10.3 ?BUN 49 ?Cr 2.26 ?Albumin 3.2 ?Echo: EF 60-65%. No atrial shunt  ? ?Assessment:  ?66 year old woman who has a past medical history of prior strokes without residual deficits, hypertension, hyperlipidemia, back pain, COPD and tobacco abuse, chronic back pain on chronic opiates, CKD presented for sudden onset of left-sided weakness along with  complaints of ongoing headache for 2 days that became extremely worsened today and also neck pain which started today.   ? ?Metabolic Encephalopathy in the setting of Acute on Chronic kidney disease and cocaine use ? ? ?Joyce Gandy DNP, ACNPC-AG  ? ? ?Recommendations: ?- Correct metabolic derangements per primary team  ?-MRI cervical spine ?-If MRI is negative, could continue work with PT/OT but no further neurology recommendations and we will sign off. ? ? ?Begin attending attestation: ? ?On my exam, she has inconsistent effort in her left upper and lower extremity, some superimposed pain.  She does describe a sensory difference between her left and right, and describes point tenderness over her cervical spine.  Her exam seems inconsistent and I suspect a psychogenic etiology, but I do think it would be reasonable to rule out cervical spine disease.  If in a C-spine MRI is negative, no further recommendations from the neurology team and we will sign off. ? ?Joyce Rack, MD ?[facility] ?[phone] ? ?If 7pm- 7am, please page neurology on call as listed in [place]. ? ?  ?

## 2021-12-09 NOTE — Progress Notes (Signed)
OT Cancellation Note ? ?Patient Details ?Name: Joyce [name] ?MRN: [id number] ?DOB: 18-Jan-1956 ? ? ?Cancelled Treatment:    Reason Eval/Treat Not Completed: Patient at procedure or test/ unavailable (Pt off of the unit upon arrival (echo), OT to f/u as pt returns.) ? ?Myrick Mcnairy A Atheena Spano ?12/09/2021, 10:23 AM ?

## 2021-12-09 NOTE — Progress Notes (Signed)
PT Cancellation Note ? ?Patient Details ?Name: Joyce [name] ?MRN: [id number] ?DOB: Jun 04, 1956 ? ? ?Cancelled Treatment:    Reason Eval/Treat Not Completed: Patient at procedure or test/unavailable ? ?Will follow up later today as time allows;  ?Otherwise, will follow up for PT tomorrow;  ? ?Thank you,  ?Roney Marion, PT  ?Acute Rehabilitation Services ?Pager [phone] ?Office [phone] ? ? ? ?Colletta [place] ?12/09/2021, 10:31 AM ?

## 2021-12-09 NOTE — Evaluation (Signed)
Physical Therapy Evaluation ?Patient Details ?Name: Joyce [name] ?MRN: [id number] ?DOB: March 28, 1956 ?Today's Date: 12/09/2021 ? ?History of Present Illness Joyce [name] is a 66 y.o. female who presents with left-sided weakness, headaches, slurred speech. Imaging negative, workup pending. Pt with medical history significant of Arthritis with chronic back pain on chronic opiates, hypertension, hyperlipidemia, history of stroke, chronic kidney disease stage III.  ?Clinical Impression ?  ?Pt admitted with above diagnosis. Lives at home with husband and grandkids, in a single-level home with a few steps to enter; Prior to admission, pt was able to walk independently (cane or walker, depending on how she felt); Presents to PT with L shoulder and knee pain, functional dependencies;  Min assist for bed mobiltiy, sit to stand transfers, and ambulation in room with RW;   Pt currently with functional limitations due to the deficits listed below (see PT Problem List). Pt will benefit from skilled PT to increase their independence and safety with mobility to allow discharge to the venue listed below.  ?   ? ?Recommendations for follow up therapy are one component of a multi-disciplinary discharge planning process, led by the attending physician.  Recommendations may be updated based on patient status, additional functional criteria and insurance authorization. ? ?Follow Up Recommendations Home health PT ? ?  ?Assistance Recommended at Discharge Intermittent Supervision/Assistance  ?Patient can return home with the following ? A little help with walking and/or transfers;A little help with bathing/dressing/bathroom;Help with stairs or ramp for entrance ? ?  ?Equipment Recommendations None recommended by PT  ?Recommendations for Other Services ?    ?  ?Functional Status Assessment Patient has had a recent decline in their functional status and demonstrates the ability to make significant improvements in function in a reasonable and  predictable amount of time.  ? ?  ?Precautions / Restrictions Precautions ?Precautions: Fall ?Restrictions ?Weight Bearing Restrictions: No  ? ?  ? ?Mobility ? Bed Mobility ?Overal bed mobility: Needs Assistance ?Bed Mobility: Supine to Sit ?  ?  ?Supine to sit: Supervision ?  ?  ?General bed mobility comments: incr time and HOB elevated ?  ? ?Transfers ?Overall transfer level: Needs assistance ?Equipment used: Rolling walker (2 wheels) ?Transfers: Sit to/from Stand ?Sit to Stand: Min guard ?  ?  ?  ?  ?  ?General transfer comment: Cues for hand placement and safety ?  ? ?Ambulation/Gait ?Ambulation/Gait assistance: Min guard ?Gait Distance (Feet): 12 Feet ?Assistive device: Rolling walker (2 wheels) ?Gait Pattern/deviations: Step-to pattern ?Gait velocity: quite slow ?  ?  ?General Gait Details: Cues for sequencing, and to use RW to unweigh painful LLE ? ?Stairs ?  ?  ?  ?  ?  ? ?Wheelchair Mobility ?  ? ?Modified Rankin (Stroke Patients Only) ?  ? ?  ? ?Balance   ?  ?Sitting balance-Leahy Scale: Good ?  ?  ?  ?Standing balance-Leahy Scale: Poor ?  ?  ?  ?  ?  ?  ?  ?  ?  ?  ?  ?  ?   ? ? ? ?Pertinent Vitals/Pain Pain Assessment ?Pain Assessment: Faces ?Faces Pain Scale: Hurts even more ?Pain Location: L shoulder ?Pain Descriptors / Indicators: Discomfort, Grimacing, Guarding ?Pain Intervention(s): Limited activity within patient's tolerance, Heat applied  ? ? ?Home Living Family/patient expects to be discharged to:: Private residence ?Living Arrangements: Spouse/significant other ?Available Help at Discharge: Family ?Type of Home: House ?Home Access: Stairs to enter ?Entrance Stairs-Rails: Right;Left ?Entrance Stairs-Number of Steps:  3 ?  ?Home Layout: One level ?Home Equipment: [place] (2 wheels);Tub bench ?   ?  ?Prior Function Prior Level of Function : Needs assist ?  ?  ?  ?  ?  ?  ?Mobility Comments: SPC vs RW ?ADLs Comments: husband assists wtih socks, shoes and getting in/otu of tub with  use of tub bench ?  ? ? ?Hand Dominance  ? Dominant Hand: Right ? ?  ?Extremity/Trunk Assessment  ? Upper Extremity Assessment ?Upper Extremity Assessment: Defer to OT evaluation ?LUE Deficits / Details: globally weak, slow and deliberate, decreased sensation ?LUE Sensation: decreased light touch ?LUE Coordination: decreased fine motor;decreased gross motor ?  ? ?Lower Extremity Assessment ?Lower Extremity Assessment: Generalized weakness;LLE deficits/detail ?LLE Deficits / Details: Limited AROM and strength L knee; difficult rehab since TKA in Aug 2022 ?  ? ?Cervical / Trunk Assessment ?Cervical / Trunk Assessment: Normal  ?Communication  ? Communication: No difficulties  ?Cognition Arousal/Alertness: Awake/alert ?Behavior During Therapy: [facility] for tasks assessed/performed ?Overall Cognitive Status: Within Functional Limits for tasks assessed ?  ?  ?  ?  ?  ?  ?  ?  ?  ?  ?  ?  ?  ?  ?  ?  ?  ?  ?  ? ?  ?General Comments General comments (skin integrity, edema, etc.): VSS on RA ? ?  ?Exercises    ? ?Assessment/Plan  ?  ?PT Assessment Patient needs continued PT services  ?PT Problem List Decreased strength;Decreased range of motion;Decreased activity tolerance;Decreased balance;Decreased mobility;Decreased knowledge of use of DME;Decreased safety awareness;Decreased knowledge of precautions ? ?   ?  ?PT Treatment Interventions DME instruction;Gait training;Stair training;Functional mobility training;Therapeutic activities;Therapeutic exercise;Balance training;Patient/family education   ? ?PT Goals (Current goals can be found in the Care Plan section)  ?Acute Rehab PT Goals ?Patient Stated Goal: Be ready to go home soon ?PT Goal Formulation: With patient ?Time For Goal Achievement: 12/23/21 ?Potential to Achieve Goals: Good ? ?  ?Frequency Min 3X/week ?  ? ? ?Co-evaluation   ?  ?  ?  ?  ? ? ?  ?AM-PAC PT "6 Clicks" Mobility  ?Outcome Measure Help needed turning from your back to your side while in a flat bed without  using bedrails?: None ?Help needed moving from lying on your back to sitting on the side of a flat bed without using bedrails?: A Little ?Help needed moving to and from a bed to a chair (including a wheelchair)?: A Little ?Help needed standing up from a chair using your arms (e.g., wheelchair or bedside chair)?: A Little ?Help needed to walk in hospital room?: A Little ?Help needed climbing 3-5 steps with a railing? : A Lot ?6 Click Score: 18 ? ?  ?End of Session Equipment Utilized During Treatment: Gait belt ?Activity Tolerance: Patient tolerated treatment well ?Patient left: in bed;with call bell/phone within reach;Other (comment) (sitting EOB with staff in room) ?Nurse Communication: Mobility status ?PT Visit Diagnosis: Unsteadiness on feet (R26.81);Pain ?Pain - Right/Left: Left ?Pain - part of body: Shoulder;Knee ?  ? ?Time: 4196-2229 ?PT Time Calculation (min) (ACUTE ONLY): 36 min ? ? ?Charges:   PT Evaluation ?$PT Eval Moderate Complexity: 1 Mod ?PT Treatments ?$Gait Training: 8-22 mins ?  ?   ? ? ?Roney Marion, PT  ?Acute Rehabilitation Services ?Office [phone] ? ? ?[place] ?12/09/2021, 6:44 PM ? ?
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
## 2021-12-09 NOTE — Evaluation (Signed)
Occupational Therapy Evaluation ?Patient Details ?Name: Joyce [name] ?MRN: [id number] ?DOB: June 17, 1956 ?Today's Date: 12/09/2021 ? ? ?History of Present Illness Joyce [name] is a 66 y.o. female who presents with left-sided weakness, headaches, slurred speech. Imaging negative, workup pending. Pt with medical history significant of Arthritis with chronic back pain on chronic opiates, hypertension, hyperlipidemia, history of stroke, chronic kidney disease stage III.  ? ?Clinical Impression ?  ?[name] was evaluated s/p the above admission list. She is generally mod I with most ADLs and mobility at baseline, but does need assist from her husband with tub transfers and socks/shoes. Upon evaluation pt presents with weakness, and impaired sensation and coordination of LUE, along with a painful L shoulder. She required min A for transfers and functional mobility with RW. She also requires min A and increased time for ADLs. Pt will benefit from OT acutely, recommend d/c to home with [facility] and direct physical assist from family.  ?   ? ?Recommendations for follow up therapy are one component of a multi-disciplinary discharge planning process, led by the attending physician.  Recommendations may be updated based on patient status, additional functional criteria and insurance authorization.  ? ?Follow Up Recommendations ? Home health OT  ?  ?Assistance Recommended at Discharge Frequent or constant Supervision/Assistance  ?Patient can return home with the following A little help with walking and/or transfers;A little help with bathing/dressing/bathroom;Assistance with cooking/housework;Assistance with feeding;Assist for transportation;Help with stairs or ramp for entrance ? ?  ?Functional Status Assessment ? Patient has had a recent decline in their functional status and demonstrates the ability to make significant improvements in function in a reasonable and predictable amount of time.  ?Equipment Recommendations ? None recommended  by OT  ?  ?Recommendations for Other Services   ? ? ?  ?Precautions / Restrictions Restrictions ?Weight Bearing Restrictions: No  ? ?  ? ?Mobility Bed Mobility ?Overal bed mobility: Needs Assistance ?Bed Mobility: Supine to Sit, Sit to Supine ?  ?  ?Supine to sit: Supervision ?Sit to supine: Supervision ?  ?General bed mobility comments: incr time and HOB elevated ?  ? ?Transfers ?Overall transfer level: Needs assistance ?Equipment used: Rolling walker (2 wheels) ?Transfers: Sit to/from Stand ?Sit to Stand: Min assist ?  ?  ?  ?  ?  ?General transfer comment: from lower surface - pt statesher bed is elveated at home ?  ? ?  ?Balance Overall balance assessment: Needs assistance ?Sitting-balance support: Feet supported ?Sitting balance-Leahy Scale: Good ?  ?  ?Standing balance support: Bilateral upper extremity supported, During functional activity ?Standing balance-Leahy Scale: Poor ?  ?  ?  ?  ?  ?  ?  ?  ?  ?  ?  ?  ?   ? ?ADL either performed or assessed with clinical judgement  ? ?ADL Overall ADL's : Needs assistance/impaired ?Eating/Feeding: Independent;Sitting ?  ?Grooming: Set up;Sitting ?  ?Upper Body Bathing: Set up;Sitting ?  ?Lower Body Bathing: Minimal assistance;Sit to/from stand ?  ?Upper Body Dressing : Set up;Sitting ?  ?Lower Body Dressing: Minimal assistance;Sit to/from stand ?  ?Toilet Transfer: Minimal assistance;Rolling walker (2 wheels);Ambulation ?  ?Toileting- Clothing Manipulation and Hygiene: Min guard;Sitting/lateral lean ?  ?  ?  ?Functional mobility during ADLs: Minimal assistance;Rolling walker (2 wheels) ?   ? ? ? ?Vision Baseline Vision/History: 1 Wears glasses ?Ability to See in Adequate Light: 0 Adequate ?Vision Assessment?: No apparent visual deficits ?Additional Comments: pt states she needs new glasses  ?   ?  Perception   ?  ?Praxis   ?  ? ?Pertinent Vitals/Pain Pain Assessment ?Pain Assessment: Faces ?Faces Pain Scale: Hurts little more ?Pain Location: L shoulder ?Pain Descriptors  / Indicators: Discomfort, Grimacing, Guarding ?Pain Intervention(s): Limited activity within patient's tolerance, Monitored during session  ? ? ? ?Hand Dominance Right ?  ?Extremity/Trunk Assessment Upper Extremity Assessment ?Upper Extremity Assessment: LUE deficits/detail ?LUE Deficits / Details: globally weak, slow and deliberate, decreased sensation ?LUE Sensation: decreased light touch ?LUE Coordination: decreased fine motor;decreased gross motor ?  ?Lower Extremity Assessment ?Lower Extremity Assessment: Defer to PT evaluation ?  ?Cervical / Trunk Assessment ?Cervical / Trunk Assessment: Normal ?  ?Communication Communication ?Communication: No difficulties ?  ?Cognition Arousal/Alertness: Awake/alert ?Behavior During Therapy: [facility] for tasks assessed/performed ?Overall Cognitive Status: Within Functional Limits for tasks assessed ?  ?  ?  ?  ?  ?  ?  ?  ?  ?  ?  ?  ?  ?  ?  ?  ?  ?  ?  ?General Comments  VSS on RA, family present. Pt had complaints of painful L shoulder. [name], MD to make hime aware ? ?  ?Exercises   ?  ?Shoulder Instructions    ? ? ?Home Living Family/patient expects to be discharged to:: Private residence ?Living Arrangements: Spouse/significant other ?Available Help at Discharge: Family ?Type of Home: House ?Home Access: Stairs to enter ?Entrance Stairs-Number of Steps: 3 ?  ?Home Layout: One level ?  ?  ?Bathroom Shower/Tub: Tub/shower unit ?  ?  ?  ?  ?Home Equipment: [place] (2 wheels);Tub bench ?  ?  ? Lives With: Spouse ? ?  ?Prior Functioning/Environment Prior Level of Function : Needs assist ?  ?  ?  ?  ?  ?  ?Mobility Comments: SPC vs RW ?ADLs Comments: husband assists wtih socks, shoes and getting in/otu of tub with use of tub bench ?  ? ?  ?  ?OT Problem List: Decreased strength;Decreased range of motion;Decreased activity tolerance;Impaired balance (sitting and/or standing);Decreased safety awareness;Decreased knowledge of use of DME or AE;Decreased  knowledge of precautions;Impaired UE functional use ?  ?   ?OT Treatment/Interventions:    ?  ?OT Goals(Current goals can be found in the care plan section) Acute Rehab OT Goals ?Patient Stated Goal: home ?OT Goal Formulation: With patient ?Time For Goal Achievement: 12/23/21 ?Potential to Achieve Goals: Good ?ADL Goals ?Pt Will Perform Grooming: with supervision;standing ?Pt Will Perform Lower Body Dressing: with min guard assist;sit to/from stand ?Pt Will Transfer to Toilet: with supervision;ambulating ?Additional ADL Goal #1: pt will demonstrate increased activity tolerance to complete at least 3 ADLs in standing with supervision A  ?OT Frequency: Min 2X/week ?  ? ?Co-evaluation   ?  ?  ?  ?  ? ?  ?AM-PAC OT "6 Clicks" Daily Activity     ?Outcome Measure Help from another person eating meals?: A Little ?Help from another person taking care of personal grooming?: A Little ?Help from another person toileting, which includes using toliet, bedpan, or urinal?: A Little ?Help from another person bathing (including washing, rinsing, drying)?: A Little ?Help from another person to put on and taking off regular upper body clothing?: A Little ?Help from another person to put on and taking off regular lower body clothing?: A Little ?6 Click Score: 18 ?  ?End of Session Equipment Utilized During Treatment: Gait belt;Rolling walker (2 wheels) ?Nurse Communication: Mobility status (pt requesting scan of L  shoulder) ? ?Activity Tolerance: Patient tolerated treatment well ?Patient left: in bed;with call bell/phone within reach;with bed alarm set ? ?OT Visit Diagnosis: Unsteadiness on feet (R26.81);Other abnormalities of gait and mobility (R26.89);Muscle weakness (generalized) (M62.81);Pain;Hemiplegia and hemiparesis ?Hemiplegia - Right/Left: Left ?Hemiplegia - dominant/non-dominant: Non-Dominant ?Hemiplegia - caused by: Unspecified  ?              ?Time: 1638-4665 ?OT Time Calculation (min): 19 min ?Charges:  OT General  Charges ?$OT Visit: 1 Visit ?OT Evaluation ?$OT Eval Moderate Complexity: 1 Mod ? ? ?Kaelyn Innocent A Jayleah Garbers ?12/09/2021, 4:57 PM ?

## 2021-12-10 ENCOUNTER — Inpatient Hospital Stay (HOSPITAL_COMMUNITY): Payer: Medicare Other

## 2021-12-10 DIAGNOSIS — N179 Acute kidney failure, unspecified: Secondary | ICD-10-CM | POA: Diagnosis present

## 2021-12-10 LAB — BASIC METABOLIC PANEL
Anion gap: 13 (ref 5–15)
BUN: 36 mg/dL — ABNORMAL HIGH (ref 8–23)
CO2: 23 mmol/L (ref 22–32)
Calcium: 8.9 mg/dL (ref 8.9–10.3)
Chloride: 104 mmol/L (ref 98–111)
Creatinine, Ser: 1.6 mg/dL — ABNORMAL HIGH (ref 0.44–1.00)
GFR, Estimated: 35 mL/min — ABNORMAL LOW (ref >60)
Glucose, Bld: 94 mg/dL (ref 70–99)
Potassium: 3.6 mmol/L (ref 3.5–5.1)
Sodium: 140 mmol/L (ref 135–145)

## 2021-12-10 LAB — HEMOGLOBIN A1C
Hgb A1c MFr Bld: 5.6 % (ref 4.8–5.6)
Mean Plasma Glucose: 114.02 mg/dL

## 2021-12-10 MED ORDER — HYDROCORTISONE 1 % EX CREA
1.0000 "application " | TOPICAL_CREAM | Freq: Three times a day (TID) | CUTANEOUS | Status: DC | PRN
  Administered 2021-12-10: 1 via TOPICAL
  Filled 2021-12-10: qty 28

## 2021-12-10 NOTE — Progress Notes (Signed)
Nursing Discharge Note ?  ?Admit Date: 12/08/2021 ? ?Discharge date: 12/10/2021  ?  ?Joyce [name] is to be discharged home per MD order.  AVS completed. Reviewed with patient and husband at bedside. Highlighted copy provided for patient to take home.  ?Patient able to verbalize understanding of discharge instructions. PIV removed. Patient stable upon discharge.  ? ?Discharge Instructions   ? ? Diet - low sodium heart healthy   Complete by: As directed ?  ? Increase activity slowly   Complete by: As directed ?  ? ?  ?  ?Allergies as of 12/10/2021   ? ?   Reactions  ? Lactose Intolerance (gi) Nausea And Vomiting  ? Aspirin Hives  ? Other Hives  ? Ivory soap  ? ?  ? ?  ?Medication List  ?  ? ?STOP taking these medications   ? ?apixaban 2.5 MG Tabs tablet ?Commonly known as: ELIQUIS ?  ?atorvastatin 10 MG tablet ?Commonly known as: LIPITOR ?  ?cephALEXin 500 MG capsule ?Commonly known as: KEFLEX ?  ?cloNIDine 0.2 mg/24hr patch ?Commonly known as: CATAPRES - Dosed in mg/24 hr ?  ?Dialyvite Vitamin D 5000 125 MCG (5000 UT) capsule ?Generic drug: Cholecalciferol ?  ?docusate sodium 100 MG capsule ?Commonly known as: COLACE ?  ?HYDROmorphone 2 MG tablet ?Commonly known as: DILAUDID ?  ?meloxicam 7.5 MG tablet ?Commonly known as: MOBIC ?  ?methocarbamol 500 MG tablet ?Commonly known as: ROBAXIN ?  ?nystatin cream ?Commonly known as: MYCOSTATIN ?  ?oxymetazoline 0.05 % nasal spray ?Commonly known as: AFRIN ?  ?polyethylene glycol 17 g packet ?Commonly known as: MIRALAX / GLYCOLAX ?  ? ?  ? ?TAKE these medications   ? ?amLODipine 10 MG tablet ?Commonly known as: NORVASC ?Take 10 mg by mouth daily. ?  ?cyclobenzaprine 10 MG tablet ?Commonly known as: FLEXERIL ?Take 10 mg by mouth 3 (three) times daily as needed for muscle spasms (for 10 days). ?  ?EYE DROPS IRRITATION RELIEF OP ?Apply 1 drop to eye 2 (two) times daily as needed (allergies/irritation). ?  ?gabapentin 300 MG capsule ?Commonly known as: NEURONTIN ?Take 1-2 capsules  (300-600 mg total) by mouth See admin instructions. Take 300 mg in the afternoon, 300 mg in the evening, and 600 mg at bedtime ?  ?labetalol 300 MG tablet ?Commonly known as: NORMODYNE ?Take 1 tablet (300 mg total) by mouth 3 (three) times daily. ?  ?lisinopril-hydrochlorothiazide 20-12.5 MG tablet ?Commonly known as: ZESTORETIC ?Take by mouth See admin instructions. Take 12.5 mg daily ?  ?oxyCODONE-acetaminophen 10-325 MG tablet ?Commonly known as: PERCOCET ?Take 1 tablet by mouth every 6 (six) hours as needed for pain. ?  ?simvastatin 40 MG tablet ?Commonly known as: ZOCOR ?Take 40 mg by mouth daily at 6 PM. ?  ?traZODone 50 MG tablet ?Commonly known as: DESYREL ?Take 50 mg by mouth at bedtime. ?  ?vitamin C 100 MG tablet ?Take 100 mg by mouth daily. ?  ?zinc gluconate 50 MG tablet ?Take 50 mg by mouth daily. ?  ? ?  ? ?  ?  ? ? ?  ?Durable Medical Equipment  ?(From admission, onward)  ?  ? ? ?  ? ?  Start     Ordered  ? 12/10/21 1324  For home use only DME Tub bench  Once       ? 12/10/21 1323  ? ?  ?  ? ?  ?  ?Discharge Instructions/ Education: ?Discharge instructions given to patient/family with verbalized understanding. ?Discharge education  completed with patient/family including: follow up instructions, medication list, discharge activities, and limitations if indicated. Patient instructed to return to Emergency Department, call 911, or call MD for any changes in condition.  ?Patient escorted via wheelchair to lobby and discharged home via private automobile.  ? ?

## 2021-12-10 NOTE — Progress Notes (Signed)
Occupational Therapy Treatment ?Patient Details ?Name: Joyce [name] ?MRN: [id number] ?DOB: 1956/01/02 ?Today's Date: 12/10/2021 ? ? ?History of present illness Joyce [name] is a 66 y.o. female who presents with left-sided weakness, headaches, slurred speech. Imaging negative, workup pending. Pt with medical history significant of Arthritis with chronic back pain on chronic opiates, hypertension, hyperlipidemia, history of stroke, chronic kidney disease stage III. ?  ?OT comments ? Pt progressing towards goals, continues to display weakness and decreased ROM in LUE. Pt able to complete in room ambulation and standing grooming/bathing task at sink with set up /min guard A, with x2 rest breaks. Pt educated on use and provided with built up utensil for bilateral self feeding and squeeze ball for strengthening LUE. Pt verbalizes/demo's understanding. Pt presenting with impairments listed below, will follow acutely. Continue to recommend HHOT at d/c.  ? ?Recommendations for follow up therapy are one component of a multi-disciplinary discharge planning process, led by the attending physician.  Recommendations may be updated based on patient status, additional functional criteria and insurance authorization. ?   ?Follow Up Recommendations ? Home health OT  ?  ?Assistance Recommended at Discharge Frequent or constant Supervision/Assistance  ?Patient can return home with the following ? A little help with walking and/or transfers;A little help with bathing/dressing/bathroom;Assistance with cooking/housework;Assistance with feeding;Assist for transportation;Help with stairs or ramp for entrance ?  ?Equipment Recommendations ? Tub/shower bench  ?  ?Recommendations for Other Services   ? ?  ?Precautions / Restrictions Precautions ?Precautions: Fall ?Restrictions ?Weight Bearing Restrictions: No  ? ? ?  ? ?Mobility Bed Mobility ?Overal bed mobility: Needs Assistance ?Bed Mobility: Supine to Sit, Sit to Supine ?  ?  ?Supine to sit:  Supervision ?Sit to supine: Supervision ?  ?  ?  ? ?Transfers ?Overall transfer level: Needs assistance ?Equipment used: Rolling walker (2 wheels) ?Transfers: Sit to/from Stand ?Sit to Stand: Min guard ?  ?  ?  ?  ?  ?  ?  ?  ?Balance Overall balance assessment: Needs assistance ?Sitting-balance support: Feet supported ?Sitting balance-Leahy Scale: Good ?Sitting balance - Comments: can reach outside BOS without LOB ?  ?Standing balance support: Bilateral upper extremity supported, During functional activity ?Standing balance-Leahy Scale: Poor ?  ?  ?  ?  ?  ?  ?  ?  ?  ?  ?  ?  ?   ? ?ADL either performed or assessed with clinical judgement  ? ?ADL Overall ADL's : Needs assistance/impaired ?  ?  ?Grooming: Dance movement psychotherapist;Wash/dry hands;Bed level;Min guard ?Grooming Details (indicate cue type and reason): completed standing at sink ?Upper Body Bathing: Set up;Sitting;Standing ?Upper Body Bathing Details (indicate cue type and reason): needing rest breaks ?  ?  ?Upper Body Dressing : Set up;Sitting ?Upper Body Dressing Details (indicate cue type and reason): to don gown ?  ?  ?Toilet Transfer: Min guard;Rolling walker (2 wheels);Ambulation;Regular Toilet ?Toilet Transfer Details (indicate cue type and reason): simulated in room ?  ?  ?  ?  ?Functional mobility during ADLs: Min guard;Rolling walker (2 wheels) ?  ?  ? ?Extremity/Trunk Assessment Upper Extremity Assessment ?Upper Extremity Assessment: Generalized weakness ?LUE Deficits / Details: globally weak, slow and deliberate, decreased sensation ?LUE Sensation: decreased light touch ?LUE Coordination: decreased fine motor;decreased gross motor ?  ?Lower Extremity Assessment ?Lower Extremity Assessment: Defer to PT evaluation ?  ?  ?  ? ?Vision   ?Vision Assessment?: No apparent visual deficits ?Additional Comments: wears glasses, reporting dizziness with  rapid eye movement ?  ?Perception Perception ?Perception: Within Functional Limits ?  ?Praxis Praxis ?Praxis: Not  tested ?  ? ?Cognition Arousal/Alertness: Awake/alert ?Behavior During Therapy: [facility] for tasks assessed/performed ?Overall Cognitive Status: Within Functional Limits for tasks assessed ?  ?  ?  ?  ?  ?  ?  ?  ?  ?  ?  ?  ?  ?  ?  ?  ?  ?  ?  ?   ?Exercises   ? ?  ?Shoulder Instructions   ? ? ?  ?General Comments VSS on RA  ? ? ?Pertinent Vitals/ Pain       Pain Assessment ?Pain Assessment: Faces ?Pain Score: 2  ?Faces Pain Scale: Hurts a little bit ?Pain Location: L shoulder ?Pain Descriptors / Indicators: Discomfort, Grimacing, Guarding ?Pain Intervention(s): Limited activity within patient's tolerance, Monitored during session, Repositioned ? ?Home Living   ?  ?  ?  ?  ?  ?  ?  ?  ?  ?  ?  ?  ?  ?  ?  ?  ?  ?  ? ?  ?Prior Functioning/Environment    ?  ?  ?  ?   ? ?Frequency ? Min 2X/week  ? ? ? ? ?  ?Progress Toward Goals ? ?OT Goals(current goals can now be found in the care plan section) ? Progress towards OT goals: Progressing toward goals ? ?Acute Rehab OT Goals ?Patient Stated Goal: to go home ?OT Goal Formulation: With patient ?Time For Goal Achievement: 12/23/21 ?Potential to Achieve Goals: Good ?ADL Goals ?Pt Will Perform Grooming: with supervision;standing ?Pt Will Perform Lower Body Dressing: with min guard assist;sit to/from stand ?Pt Will Transfer to Toilet: with supervision;ambulating ?Additional ADL Goal #1: pt will demonstrate increased activity tolerance to complete at least 3 ADLs in standing with supervision A  ?Plan Discharge plan remains appropriate;Frequency remains appropriate   ? ?Co-evaluation ? ? ?   ?  ?  ?  ?  ? ?  ?AM-PAC OT "6 Clicks" Daily Activity     ?Outcome Measure ? ? Help from another person eating meals?: None ?Help from another person taking care of personal grooming?: None ?Help from another person toileting, which includes using toliet, bedpan, or urinal?: A Little ?Help from another person bathing (including washing, rinsing, drying)?: A Little ?Help from another person to  put on and taking off regular upper body clothing?: A Little ?Help from another person to put on and taking off regular lower body clothing?: A Little ?6 Click Score: 20 ? ?  ?End of Session Equipment Utilized During Treatment: Gait belt;Rolling walker (2 wheels) ? ?OT Visit Diagnosis: Unsteadiness on feet (R26.81);Other abnormalities of gait and mobility (R26.89);Muscle weakness (generalized) (M62.81);Pain;Hemiplegia and hemiparesis ?Hemiplegia - Right/Left: Left ?Hemiplegia - dominant/non-dominant: Non-Dominant ?Hemiplegia - caused by: Unspecified ?  ?Activity Tolerance Patient tolerated treatment well ?  ?Patient Left in bed;with call bell/phone within reach;with bed alarm set ?  ?Nurse Communication Mobility status ?  ? ?   ? ?Time: 3149-7026 ?OT Time Calculation (min): 41 min ? ?Charges: OT General Charges ?$OT Visit: 1 Visit ?OT Treatments ?$Self Care/Home Management : 38-52 mins ? ?Lynnda Child, OTD, OTR/L ?Acute Rehab ?[phone] ? ? ?Kaylyn Lim ?12/10/2021, 11:49 AM ?

## 2021-12-10 NOTE — Plan of Care (Signed)
Neurology plan of care ? ?MRI c spine wo contrast stable from 2020: ?1. 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 ?2. No spinal canal stenosis. ? ?Personally reviewed. ? ?Inconsistent exam and no new findings on MRI brain and c spine to explain sx. Suspect functional/nonorganic etiology. No further inpatient neurology workup recommended. Neuro to sign off, but please re-engage if additional neurologic concerns arise. ? ?Su Monks, MD ?[facility] ?[phone] ? ?If 7pm- 7am, please page neurology on call as listed in [place]. ? ?

## 2021-12-10 NOTE — TOC Transition Note (Addendum)
Transition of Care (TOC) - CM/SW Discharge Note ? ? ?Patient Details  ?Name: Joyce [name] ?MRN: [id number] ?Date of Birth: Nov 08, 1955 ? ?Transition of Care (TOC) CM/SW Contact:  ?Pollie Friar, RN ?Phone Number: ?12/10/2021, 1:25 PM ? ? ?Clinical Narrative:    ?Pt is from home with her spouse. She denies any issues with her home medications. She has: walker/ cane and 3 in1 at home.  ?Patient is discharging home with home health services. Patient asked to use [facility] as she has used them in the past. Ramond Marrow with [facility] is having the referral review.  ?Patient requesting tub bench for home. CM has sent the orders to [place]. Patient asked that the DME be shipped to her home. [facility] is aware.  ?Pt uses medicaid transportation but has issues with appt that are scheduled earlier than 3 days that medicaid needs to schedule a ride. CM will see if resources for other transportation can be added to AVS.  ?Pt has transportation home today.  ? ?1423: [facility] has declined the referral. CM was able to get her set up with [facility]. Information on the AVS. ? ? ?Final next level of care: [place] ?Barriers to Discharge: No Barriers Identified ? ? ?Patient Goals and CMS Choice ?  ?CMS Medicare.gov Compare Post Acute Care list provided to:: Patient ?Choice offered to / list presented to : Patient ? ?Discharge Placement ?  ?           ?  ?  ?  ?  ? ?Discharge Plan and Services ?  ?  ?           ?DME Arranged: Tub bench ?DME Agency: [facility] ?Date DME Agency Contacted: 12/10/21 ?  ?Representative spoke with at DME Agency: Santiago Glad ?HH Arranged: PT, OT ?[place] Agency: [facility] ?Date HH Agency Contacted: 12/10/21 ?  ?Representative spoke with at [place]: Ramond Marrow ? ?Social Determinants of Health (SDOH) Interventions ?  ? ? ?Readmission Risk Interventions ? ?  08/02/2019  ? 11:03 AM  ?Readmission Risk Prevention Plan  ?PCP or Specialist appointment within 3-5 days of discharge Complete   ? ? ? ? ? ?

## 2021-12-18 NOTE — Discharge Summary (Signed)
?Physician Discharge Summary ?  ?Patient: Joyce [name] MRN: [id number] DOB: [birth year]  ?Admit date:     12/08/2021  ?Discharge date: 12/10/2021  ?Discharge Physician: Berle Mull  ?PCP: [facility] [place] ? ?Recommendations at discharge: ?Follow-up with PCP in 1 week. ? ?Discharge Diagnoses: ?Principal Problem: ?  Acute left-sided weakness ?Active Problems: ?  Essential hypertension, benign ?  Hyperlipidemia ?  Chronic back pain ?  History of stroke ?  Acute encephalopathy ?  Acute renal failure superimposed on stage 3b chronic kidney disease ([place]) ? ? ?Hospital Course: ?Joyce [name] is a 66 y.o. female with medical history significant of Arthritis with chronic back pain on chronic opiates, hypertension, hyperlipidemia, history of stroke, chronic kidney disease stage III. ?Presents with left-sided weakness, headache and slurred speech.  Also reported injury to left shoulder.  MRI negative for acute stroke.  Neurology consulted but currently signed off.  Cocaine positive. ? ?Assessment and Plan: ? Acute left-sided weakness ?TIA or stroke ruled out. ?Appreciate neuro consultation. ?MRI brain negative for any acute abnormality. ?CTA and CT head are unremarkable for any significant abnormality as well. ?Patient continues to exhibit left-sided weakness. ?Unsure whether this is musculoskeletal pain related limitation or neurological limitation. ?MRI C-spine also negative for any acute neurological abnormality. ?Continue physical therapy on discharge. ?  ?  Essential hypertension, benign ?Blood pressure mildly elevated. ?Avoid selective beta-blockers due to cocaine positive. ?  ?  Hyperlipidemia ?Continue statin. ?  ?  Chronic back pain ?On chronic Percocet.  We will continue. ?  ?  Acute renal failure superimposed on stage 3 chronic kidney disease ([place]) ?From poor p.o. intake. ?Renal function improving.  Ultrasound renal negative for any obstruction.  Avoid nephrotoxic medication.  Not a good candidate for resuming  losartan HCTZ combination on discharge. ?Monitor. ?  ?  Acute encephalopathy ?Most likely metabolic in nature. ?MRI brain negative for ?No evidence of acute infection. ?Monitor. ?  ?Obesity. ?Body mass index is 35.67 kg/m?Marland Kitchen  ?Placing the patient at high risk for poor outcome. ? ?Potential substance abuse. ?Patient's UDS is positive with cocaine. ?Patient was educated to quit substance. ?Patient tells me that she is actually not abusing cocaine and only smoking cigarettes that her husband obtains locally. ? ?Consultants: Neurology ?Procedures performed:  ?Echocardiogram ?DISCHARGE MEDICATION: ?Allergies as of 12/10/2021   ? ?   Reactions  ? Lactose Intolerance (gi) Nausea And Vomiting  ? Aspirin Hives  ? Other Hives  ? Ivory soap  ? ?  ? ?  ?Medication List  ?  ? ?STOP taking these medications   ? ?apixaban 2.5 MG Tabs tablet ?Commonly known as: ELIQUIS ?  ?atorvastatin 10 MG tablet ?Commonly known as: LIPITOR ?  ?cephALEXin 500 MG capsule ?Commonly known as: KEFLEX ?  ?cloNIDine 0.2 mg/24hr patch ?Commonly known as: CATAPRES - Dosed in mg/24 hr ?  ?Dialyvite Vitamin D 5000 125 MCG (5000 UT) capsule ?Generic drug: Cholecalciferol ?  ?docusate sodium 100 MG capsule ?Commonly known as: COLACE ?  ?HYDROmorphone 2 MG tablet ?Commonly known as: DILAUDID ?  ?meloxicam 7.5 MG tablet ?Commonly known as: MOBIC ?  ?methocarbamol 500 MG tablet ?Commonly known as: ROBAXIN ?  ?nystatin cream ?Commonly known as: MYCOSTATIN ?  ?oxymetazoline 0.05 % nasal spray ?Commonly known as: AFRIN ?  ?polyethylene glycol 17 g packet ?Commonly known as: MIRALAX / GLYCOLAX ?  ? ?  ? ?TAKE these medications   ? ?amLODipine 10 MG tablet ?Commonly known as: NORVASC ?Take 10 mg by  mouth daily. ?  ?cyclobenzaprine 10 MG tablet ?Commonly known as: FLEXERIL ?Take 10 mg by mouth 3 (three) times daily as needed for muscle spasms (for 10 days). ?  ?EYE DROPS IRRITATION RELIEF OP ?Apply 1 drop to eye 2 (two) times daily as needed (allergies/irritation). ?   ?gabapentin 300 MG capsule ?Commonly known as: NEURONTIN ?Take 1-2 capsules (300-600 mg total) by mouth See admin instructions. Take 300 mg in the afternoon, 300 mg in the evening, and 600 mg at bedtime ?  ?labetalol 300 MG tablet ?Commonly known as: NORMODYNE ?Take 1 tablet (300 mg total) by mouth 3 (three) times daily. ?  ?lisinopril-hydrochlorothiazide 20-12.5 MG tablet ?Commonly known as: ZESTORETIC ?Take by mouth See admin instructions. Take 12.5 mg daily ?  ?oxyCODONE-acetaminophen 10-325 MG tablet ?Commonly known as: PERCOCET ?Take 1 tablet by mouth every 6 (six) hours as needed for pain. ?  ?simvastatin 40 MG tablet ?Commonly known as: ZOCOR ?Take 40 mg by mouth daily at 6 PM. ?  ?traZODone 50 MG tablet ?Commonly known as: DESYREL ?Take 50 mg by mouth at bedtime. ?  ?vitamin C 100 MG tablet ?Take 100 mg by mouth daily. ?  ?zinc gluconate 50 MG tablet ?Take 50 mg by mouth daily. ?  ? ?  ? ? Follow-up Information   ? ? [place] Schedule an appointment as soon as possible for a visit in 1 week(s).   ?Contact information: ?[address] ?[name] [address] ?[phone] ? ? ?  ?  ? ? [facility] Follow up.   ?Why: The home health agency will contact you for the first home visit. ?Contact information: ?[phone] ? ?  ?  ? ?  ?  ? ?  ? ?Disposition: Home ?Diet recommendation: Cardiac diet ? ?Discharge Exam: ?Vitals:  ? 12/10/21 4174 12/10/21 0409 12/10/21 0424 12/10/21 0750  ?BP: (!) 212/73 (!) 188/68 (!) 170/72 (!) 185/55  ?Pulse: 61 73 66 67  ?Resp: '17 16 15 16  '$ ?Temp: 98.5 ?F (36.9 ?C)   98.4 ?F (36.9 ?C)  ?TempSrc: Oral   Oral  ?SpO2: 100%   100%  ?Weight:      ?Height:      ? ?General: Appear in no distress; no visible Abnormal Neck Mass Or lumps, Conjunctiva normal ?Cardiovascular: S1 and S2 Present, no Murmur, ?Respiratory: good respiratory effort, Bilateral Air entry present and CTA, no Crackles, no wheezes ?Abdomen: Bowel Sound present, Non tender ?Extremities: no  Pedal edema ?Neurology: alert and oriented to time, place, and person ?Gait not checked due to patient safety concerns ?Filed Weights  ? 12/08/21 1328  ?Weight: 103.3 kg  ? ?Condition at discharge: stable ? ?The results of significant diagnostics from this hospitalization (including imaging, microbiology, ancillary and laboratory) are listed below for reference.  ? ?Imaging Studies: ?MR BRAIN WO CONTRAST ? ?Result Date: 12/09/2021 ?CLINICAL DATA:  Acute neurologic deficit EXAM: MRI HEAD WITHOUT CONTRAST TECHNIQUE: Multiplanar, multiecho pulse sequences of the brain and surrounding structures were obtained without intravenous contrast. COMPARISON:  02/11/2019 FINDINGS: Brain: No acute infarct, mass effect or extra-axial collection. No acute or chronic hemorrhage. There is multifocal hyperintense T2-weighted signal within the white matter. Parenchymal volume and CSF spaces are normal. Old left cerebellar infarct. The midline structures are normal. Vascular: Major flow voids are preserved. Skull and upper cervical spine: Normal calvarium and skull base. Visualized upper cervical spine and soft tissues are normal. Sinuses/Orbits:No paranasal sinus fluid levels or advanced mucosal thickening. No mastoid or middle ear effusion.  Normal orbits. IMPRESSION: 1. No acute intracranial abnormality. 2. 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 Electronically Signed   By: Ulyses Jarred M.D.   On: 12/09/2021 01:33  ? ?MR CERVICAL SPINE WO CONTRAST ? ?Result Date: 12/10/2021 ?CLINICAL DATA:  Myelopathy, acute, cervical spine EXAM: MRI CERVICAL SPINE WITHOUT CONTRAST TECHNIQUE: Multiplanar, multisequence MR imaging of the cervical spine was performed. No intravenous contrast was administered. COMPARISON:  02/11/2019 FINDINGS: Alignment: Physiologic. Vertebrae: No fracture, evidence of discitis, or bone lesion. Cord: Normal signal and morphology. Posterior Fossa, vertebral arteries, paraspinal tissues: Negative. Disc  levels: C1-2: Unremarkable. C2-3: Normal disc space and facet joints. There is no spinal canal stenosis. No neural foraminal stenosis. C3-4: Unchanged small central disc protrusion with mild uncovertebral h

## 2022-02-12 ENCOUNTER — Other Ambulatory Visit (HOSPITAL_COMMUNITY): Payer: Self-pay | Admitting: Specialist

## 2022-02-12 DIAGNOSIS — Z96652 Presence of left artificial knee joint: Secondary | ICD-10-CM

## 2022-02-12 DIAGNOSIS — Z96659 Presence of unspecified artificial knee joint: Secondary | ICD-10-CM

## 2022-02-20 ENCOUNTER — Encounter (HOSPITAL_COMMUNITY)
Admission: RE | Admit: 2022-02-20 | Discharge: 2022-02-20 | Disposition: A | Discharge status: Home / Self Care | Payer: Medicare Other | Source: Ambulatory Visit | Attending: Specialist | Admitting: Specialist

## 2022-02-20 ENCOUNTER — Encounter (HOSPITAL_COMMUNITY): Payer: Self-pay

## 2022-02-20 DIAGNOSIS — Z96652 Presence of left artificial knee joint: Secondary | ICD-10-CM | POA: Insufficient documentation

## 2022-02-20 DIAGNOSIS — Z96659 Presence of unspecified artificial knee joint: Secondary | ICD-10-CM | POA: Diagnosis present

## 2022-02-20 DIAGNOSIS — T84038A Mechanical loosening of other internal prosthetic joint, initial encounter: Secondary | ICD-10-CM | POA: Diagnosis present

## 2022-02-20 MED ORDER — TECHNETIUM TC 99M MEDRONATE IV KIT
20.0000 | PACK | Freq: Once | INTRAVENOUS | Status: AC | PRN
  Administered 2022-02-20: 20.8 via INTRAVENOUS

## 2022-03-20 ENCOUNTER — Other Ambulatory Visit: Payer: Self-pay | Admitting: Orthopedic Surgery

## 2022-03-20 DIAGNOSIS — Z96652 Presence of left artificial knee joint: Secondary | ICD-10-CM

## 2022-04-04 ENCOUNTER — Ambulatory Visit
Admission: RE | Admit: 2022-04-04 | Discharge: 2022-04-04 | Disposition: A | Discharge status: Home / Self Care | Payer: Medicare Other | Source: Ambulatory Visit | Attending: Orthopedic Surgery | Admitting: Orthopedic Surgery

## 2022-04-04 DIAGNOSIS — Z96652 Presence of left artificial knee joint: Secondary | ICD-10-CM

## 2022-10-17 ENCOUNTER — Other Ambulatory Visit (HOSPITAL_COMMUNITY): Payer: Self-pay | Admitting: Adult Medicine

## 2022-10-17 DIAGNOSIS — Z1231 Encounter for screening mammogram for malignant neoplasm of breast: Secondary | ICD-10-CM

## 2022-10-23 ENCOUNTER — Ambulatory Visit (HOSPITAL_COMMUNITY): Payer: Medicare Other

## 2022-11-27 ENCOUNTER — Telehealth: Payer: 59 | Admitting: Physician Assistant

## 2022-11-27 DIAGNOSIS — L299 Pruritus, unspecified: Secondary | ICD-10-CM

## 2022-11-27 DIAGNOSIS — B86 Scabies: Secondary | ICD-10-CM | POA: Diagnosis not present

## 2022-11-27 MED ORDER — HYDROXYZINE HCL 10 MG PO TABS: 10.0000 mg | ORAL_TABLET | Freq: Three times a day (TID) | ORAL | 0 refills | Status: DC | PRN

## 2022-11-27 MED ORDER — PERMETHRIN 5 % EX CREA: 1.0000 | TOPICAL_CREAM | Freq: Once | CUTANEOUS | 0 refills | Status: AC
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
## 2022-11-27 NOTE — Patient Instructions (Signed)
Bryn Gulling, thank you for joining Margaretann Loveless, PA-C for today's virtual visit.  While this provider is not your primary care provider (PCP), if your PCP is located in our provider database this encounter information will be shared with them immediately following your visit.   A [facility] MyChart account gives you access to today's visit and all your visits, tests, and labs performed at [facility] " click here if you don't have a [facility] MyChart account or go to [email or web address]  Consent: (Patient) Joyce [name] provided verbal consent for this virtual visit at the beginning of the encounter.  Current Medications:  Current Outpatient Medications:    hydrOXYzine (ATARAX) 10 MG tablet, Take 1 tablet (10 mg total) by mouth 3 (three) times daily as needed., Disp: 30 tablet, Rfl: 0   permethrin (ELIMITE) 5 % cream, Apply 1 Application topically once for 1 dose., Disp: 120 g, Rfl: 0   amLODipine (NORVASC) 10 MG tablet, Take 10 mg by mouth daily., Disp: , Rfl:    Ascorbic Acid (VITAMIN C) 100 MG tablet, Take 100 mg by mouth daily., Disp: , Rfl:    cyclobenzaprine (FLEXERIL) 10 MG tablet, Take 10 mg by mouth 3 (three) times daily as needed for muscle spasms (for 10 days)., Disp: , Rfl:    gabapentin (NEURONTIN) 300 MG capsule, Take 1-2 capsules (300-600 mg total) by mouth See admin instructions. Take 300 mg in the afternoon, 300 mg in the evening, and 600 mg at bedtime, Disp: , Rfl:    labetalol (NORMODYNE) 300 MG tablet, Take 1 tablet (300 mg total) by mouth 3 (three) times daily., Disp: 90 tablet, Rfl: 3   lisinopril-hydrochlorothiazide (ZESTORETIC) 20-12.5 MG tablet, Take by mouth See admin instructions. Take 12.5 mg daily, Disp: , Rfl:    oxyCODONE-acetaminophen (PERCOCET) 10-325 MG tablet, Take 1 tablet by mouth every 6 (six) hours as needed for pain., Disp: , Rfl:    simvastatin (ZOCOR) 40 MG tablet, Take 40 mg by mouth daily at 6 PM., Disp: , Rfl:     Tetrahydrozoline-Zn Sulfate (EYE DROPS IRRITATION RELIEF OP), Apply 1 drop to eye 2 (two) times daily as needed (allergies/irritation)., Disp: , Rfl:    traZODone (DESYREL) 50 MG tablet, Take 50 mg by mouth at bedtime., Disp: , Rfl:    zinc gluconate 50 MG tablet, Take 50 mg by mouth daily., Disp: , Rfl:   Current Facility-Administered Medications:    multivitamins with iron tablet 1 tablet, 1 tablet, Oral, Daily, Jene Every, MD   Medications ordered in this encounter:  Meds ordered this encounter  Medications   permethrin (ELIMITE) 5 % cream    Sig: Apply 1 Application topically once for 1 dose.    Dispense:  120 g    Refill:  0    Order Specific Question:   Supervising Provider    Answer:   Merrilee Jansky [[id number]]   hydrOXYzine (ATARAX) 10 MG tablet    Sig: Take 1 tablet (10 mg total) by mouth 3 (three) times daily as needed.    Dispense:  30 tablet    Refill:  0    Order Specific Question:   Supervising Provider    Answer:   Merrilee Jansky [id number]     *If you need refills on other medications prior to your next appointment, please contact your pharmacy*  Follow-Up: Call back or seek an in-person evaluation if the symptoms worsen or if the condition fails to improve as anticipated.  [facility] [phone]  Other Instructions  Pruritus Pruritus is an itchy feeling on the skin. One of the most common causes is dry skin, but many different things can cause itching. Most cases of itching do not require medical attention. Sometimes itchy skin can turn into a rash or a secondary infection. Follow these instructions at home: Skin care  Do not use scented soaps, detergents, perfumes, and cosmetic products. Instead, use gentle, unscented versions of these items. Apply moisturizing creams to your skin frequently, at least twice daily. Apply immediately after bathing while skin is still wet. Take medicines or apply medicated creams only as told by your  health care provider. This may include: Corticosteroid cream or topical calcineurin inhibitor. Anti-itch lotions containing urea, camphor, or menthol. Oral antihistamines. Do not take hot showers or baths, which can make itching worse. A short, cool shower may help with itching as long as you apply moisturizing lotion after the shower. Apply a cool, wet cloth (cool compress) to the affected areas. You may take lukewarm baths with one of the following: Epsom salts. You can get these at your local pharmacy or grocery store. Follow the instructions on the packaging. Baking soda. Pour a small amount into the bath as told by your health care provider. Colloidal oatmeal. You can get this at your local pharmacy or grocery store. Follow the instructions on the packaging. Do not scratch your skin. General instructions Avoid wearing tight clothes. Keep a journal to help find out what is causing your itching. Write down: What you eat and drink. What cosmetic products you use. What soaps or detergents you use. What you wear, including jewelry. Use a humidifier. This keeps the air moist, which helps to prevent dry skin. Be aware of any changes in your itchiness. Tell your health care provider about any changes. Contact a health care provider if: The itching does not go away after several days. You notice redness, warmth, or drainage on the skin where you have scratched. You are unusually thirsty or urinating more than normal. Your skin tingles or feels numb. Your skin or the white parts of your eyes turn yellow (jaundice). You feel weak. You have any of the following: Night sweats. Tiredness (fatigue). Weight loss. Abdominal pain. Summary Pruritus is an itchy feeling on the skin. One of the most common causes is dry skin, but many different conditions and factors can cause itching. Apply moisturizing creams to your skin frequently, at least twice daily. Apply immediately after bathing while skin  is still wet. Take medicines or apply medicated creams only as told by your health care provider. Do not take hot showers or baths. Do not use scented soaps, detergents, perfumes, or cosmetic products. Keep a journal to help find out what is causing your itching. This information is not intended to replace advice given to you by your health care provider. Make sure you discuss any questions you have with your health care provider. Document Revised: 08/29/2021 Document Reviewed: 08/29/2021 Elsevier Patient Education  2023 Elsevier Inc.    If you have been instructed to have an in-person evaluation today at a local Urgent Care facility, please use the link below. It will take you to a list of all of our available [facility], including address, phone number and hours of operation. Please do not delay care.  [facility]  If you or a family member do not have a primary care provider, use the link below to schedule a  visit and establish care. When you choose a [facility] primary care physician or advanced practice provider, you gain a long-term partner in health. Find a Primary Care Provider  Learn more about [facility]'s in-office and virtual care options:  - Get Care Now

## 2022-11-28 ENCOUNTER — Other Ambulatory Visit (HOSPITAL_COMMUNITY): Payer: Self-pay | Admitting: Adult Medicine

## 2022-11-28 DIAGNOSIS — Z1231 Encounter for screening mammogram for malignant neoplasm of breast: Secondary | ICD-10-CM

## 2022-12-04 ENCOUNTER — Inpatient Hospital Stay (HOSPITAL_COMMUNITY): Admission: RE | Admit: 2022-12-04 | Payer: 59 | Source: Ambulatory Visit

## 2022-12-04 DIAGNOSIS — Z1231 Encounter for screening mammogram for malignant neoplasm of breast: Secondary | ICD-10-CM

## 2022-12-18 ENCOUNTER — Telehealth: Payer: 59 | Admitting: Physician Assistant

## 2022-12-18 DIAGNOSIS — B86 Scabies: Secondary | ICD-10-CM

## 2022-12-19 NOTE — Progress Notes (Signed)
Because you have failed first line treatments, I feel your condition warrants further evaluation and I recommend that you be seen in a face to face visit.   NOTE: There will be NO CHARGE for this eVisit   If you are having a true medical emergency please call 911.      For an urgent face to face visit, [facility] has eight urgent care centers for your convenience:   NEW!! [facility] at [place] Get Driving Directions [phone] [address], [address] [place], [address]    [facility] [place] Get Driving Directions [phone] [address] [place], [place] [address]   [facility] ([place]) Get Driving Directions [phone] [address] [place], [place] [address]  [facility] ([place]) Get Driving Directions [phone] [address] [place],  [place]  [address]  [facility] ([place] [facility] [place] Get Driving Directions  [phone] [address] ,  [place] [address]   [facility] Get Driving Directions [phone] [address] [place], [place] [address]   [facility] [place] Get Driving Directions  [phone] [address] [place], [place] [address]   [facility] [place] Get Driving Directions [phone] [address] , [place] [address]  Your MyChart E-visit questionnaire answers were reviewed by a board certified advanced clinical practitioner to complete your personal care plan based on your specific symptoms.  Thank you for using e-Visits.    I have spent 5 minutes in review of e-visit questionnaire, review and updating patient chart, medical decision making and response to patient.   Jaskiran Pata M Suhayla Chisom, PA-C  

## 2022-12-20 ENCOUNTER — Other Ambulatory Visit (HOSPITAL_COMMUNITY): Payer: Self-pay | Admitting: Adult Medicine

## 2022-12-20 DIAGNOSIS — Z1231 Encounter for screening mammogram for malignant neoplasm of breast: Secondary | ICD-10-CM

## 2022-12-24 ENCOUNTER — Other Ambulatory Visit (HOSPITAL_COMMUNITY): Payer: Self-pay | Admitting: Adult Medicine

## 2022-12-24 DIAGNOSIS — Z1231 Encounter for screening mammogram for malignant neoplasm of breast: Secondary | ICD-10-CM

## 2022-12-26 ENCOUNTER — Telehealth: Payer: 59 | Admitting: Family Medicine

## 2022-12-26 ENCOUNTER — Inpatient Hospital Stay (HOSPITAL_COMMUNITY): Admission: RE | Admit: 2022-12-26 | Payer: 59 | Source: Ambulatory Visit

## 2022-12-26 DIAGNOSIS — L299 Pruritus, unspecified: Secondary | ICD-10-CM

## 2022-12-26 DIAGNOSIS — B86 Scabies: Secondary | ICD-10-CM

## 2022-12-26 DIAGNOSIS — Z1231 Encounter for screening mammogram for malignant neoplasm of breast: Secondary | ICD-10-CM

## 2022-12-26 NOTE — Progress Notes (Signed)
[place]  Recurrent concerns for on going scabies. Treatment as run out. Resent an EV- last week post treatment advised to be seen in person. Needs to have follow up since first line treatment has not completed the removal of the scabies. Advised in person is best given the duration of on going symptoms.  Patient acknowledged agreement and understanding of the plan.

## 2022-12-27 ENCOUNTER — Encounter (HOSPITAL_COMMUNITY): Payer: Self-pay

## 2022-12-27 ENCOUNTER — Ambulatory Visit (HOSPITAL_COMMUNITY)
Admission: RE | Admit: 2022-12-27 | Discharge: 2022-12-27 | Disposition: A | Discharge status: Home / Self Care | Payer: 59 | Source: Ambulatory Visit | Attending: Adult Medicine | Admitting: Adult Medicine

## 2022-12-27 DIAGNOSIS — Z1231 Encounter for screening mammogram for malignant neoplasm of breast: Secondary | ICD-10-CM | POA: Insufficient documentation

## 2023-04-21 ENCOUNTER — Ambulatory Visit (HOSPITAL_COMMUNITY): Payer: 59

## 2023-07-10 ENCOUNTER — Telehealth: Payer: 59 | Admitting: Physician Assistant

## 2023-07-10 DIAGNOSIS — L219 Seborrheic dermatitis, unspecified: Secondary | ICD-10-CM

## 2023-07-10 DIAGNOSIS — L308 Other specified dermatitis: Secondary | ICD-10-CM | POA: Diagnosis not present

## 2023-07-10 DIAGNOSIS — L209 Atopic dermatitis, unspecified: Secondary | ICD-10-CM

## 2023-07-10 MED ORDER — HYDROXYZINE HCL 10 MG PO TABS: 10.0000 mg | ORAL_TABLET | Freq: Three times a day (TID) | ORAL | 1 refills | Status: DC | PRN

## 2023-07-10 MED ORDER — TRIAMCINOLONE ACETONIDE 0.1 % EX CREA: 1.0000 | TOPICAL_CREAM | Freq: Two times a day (BID) | CUTANEOUS | 1 refills | Status: DC

## 2023-07-10 MED ORDER — KETOCONAZOLE 2 % EX SHAM: 1.0000 | MEDICATED_SHAMPOO | CUTANEOUS | 2 refills | Status: DC

## 2023-07-10 NOTE — Patient Instructions (Signed)
Bryn Gulling, thank you for joining Gilberto Better, PA-C for today's virtual visit.  While this provider is not your primary care provider (PCP), if your PCP is located in our provider database this encounter information will be shared with them immediately following your visit.   A [facility] MyChart account gives you access to today's visit and all your visits, tests, and labs performed at [facility] " click here if you don't have a [facility] MyChart account or go to [email or web address]  Consent: (Patient) Braxtyn C Cassidy provided verbal consent for this virtual visit at the beginning of the encounter.  Current Medications:  Current Outpatient Medications:    ketoconazole (NIZORAL) 2 % shampoo, Apply 1 Application topically 2 (two) times a week., Disp: 120 mL, Rfl: 2   triamcinolone cream (KENALOG) 0.1 %, Apply 1 Application topically 2 (two) times daily. For 10 days, Disp: 30 g, Rfl: 1   amLODipine (NORVASC) 10 MG tablet, Take 10 mg by mouth daily., Disp: , Rfl:    Ascorbic Acid (VITAMIN C) 100 MG tablet, Take 100 mg by mouth daily., Disp: , Rfl:    cyclobenzaprine (FLEXERIL) 10 MG tablet, Take 10 mg by mouth 3 (three) times daily as needed for muscle spasms (for 10 days)., Disp: , Rfl:    hydrOXYzine (ATARAX) 10 MG tablet, Take 1 tablet (10 mg total) by mouth 3 (three) times daily as needed., Disp: 30 tablet, Rfl: 1   simvastatin (ZOCOR) 40 MG tablet, Take 40 mg by mouth daily at 6 PM., Disp: , Rfl:    Tetrahydrozoline-Zn Sulfate (EYE DROPS IRRITATION RELIEF OP), Apply 1 drop to eye 2 (two) times daily as needed (allergies/irritation)., Disp: , Rfl:    traZODone (DESYREL) 50 MG tablet, Take 50 mg by mouth at bedtime., Disp: , Rfl:   Current Facility-Administered Medications:    multivitamins with iron tablet 1 tablet, 1 tablet, Oral, Daily, Jene Every, MD   Medications ordered in this encounter:  Meds ordered this encounter  Medications   triamcinolone cream  (KENALOG) 0.1 %    Sig: Apply 1 Application topically 2 (two) times daily. For 10 days    Dispense:  30 g    Refill:  1    Order Specific Question:   Supervising Provider    Answer:   Merrilee Jansky [[id number]]   ketoconazole (NIZORAL) 2 % shampoo    Sig: Apply 1 Application topically 2 (two) times a week.    Dispense:  120 mL    Refill:  2    Order Specific Question:   Supervising Provider    Answer:   Merrilee Jansky [[id number]]   hydrOXYzine (ATARAX) 10 MG tablet    Sig: Take 1 tablet (10 mg total) by mouth 3 (three) times daily as needed.    Dispense:  30 tablet    Refill:  1    Order Specific Question:   Supervising Provider    Answer:   Merrilee Jansky [id number]     *If you need refills on other medications prior to your next appointment, please contact your pharmacy*  Follow-Up: Call back or seek an in-person evaluation if the symptoms worsen or if the condition fails to improve as anticipated.  [facility] [phone]  Other Instructions Start medicines as prescribed. Use shampoo twice a week for 2 weeks and then once weekly if needed. Apply steroid cream for maximum of 7-10 days, but if no improvement then consider scheduling another appointment. Follow up  with PCP if symptoms don't improve. Consider a referral to Dermatologist.   If you have been instructed to have an in-person evaluation today at a local Urgent Care facility, please use the link below. It will take you to a list of all of our available [facility], including address, phone number and hours of operation. Please do not delay care.  [facility]  If you or a family member do not have a primary care provider, use the link below to schedule a visit and establish care. When you choose a [facility] primary care physician or advanced practice provider, you gain a long-term partner in health. Find a Primary Care Provider  Learn more about [facility]'s in-office  and virtual care options: [facility] - Get Care Now
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
## 2023-11-21 ENCOUNTER — Telehealth: Payer: Self-pay

## 2023-11-21 ENCOUNTER — Other Ambulatory Visit (HOSPITAL_COMMUNITY): Payer: Self-pay | Admitting: Orthopedic Surgery

## 2023-11-21 DIAGNOSIS — M25562 Pain in left knee: Secondary | ICD-10-CM

## 2023-11-21 NOTE — Telephone Encounter (Signed)
 Pre op medical clearance for Left Knee Total Arthroscopy   Noted Copied Sleeved (put in front folder at front desk)  Fax to [phone] [facility] Dr Claiborne Crew, MD

## 2023-12-01 ENCOUNTER — Encounter (HOSPITAL_COMMUNITY)
Admission: RE | Admit: 2023-12-01 | Discharge: 2023-12-01 | Disposition: A | Discharge status: Home / Self Care | Source: Ambulatory Visit | Attending: Orthopedic Surgery | Admitting: Orthopedic Surgery

## 2023-12-01 DIAGNOSIS — M25562 Pain in left knee: Secondary | ICD-10-CM | POA: Insufficient documentation

## 2023-12-01 MED ORDER — TECHNETIUM TC 99M MEDRONATE IV KIT
20.0000 | PACK | Freq: Once | INTRAVENOUS | Status: AC | PRN
Start: 2023-12-01 — End: 2023-12-01
  Administered 2023-12-01: 19.5 via INTRAVENOUS

## 2023-12-03 ENCOUNTER — Ambulatory Visit: Payer: Self-pay

## 2023-12-16 ENCOUNTER — Telehealth: Payer: Self-pay

## 2023-12-16 ENCOUNTER — Telehealth (INDEPENDENT_AMBULATORY_CARE_PROVIDER_SITE_OTHER): Payer: Self-pay

## 2023-12-16 DIAGNOSIS — Z01818 Encounter for other preprocedural examination: Secondary | ICD-10-CM | POA: Diagnosis not present

## 2023-12-16 DIAGNOSIS — M1712 Unilateral primary osteoarthritis, left knee: Secondary | ICD-10-CM | POA: Diagnosis not present

## 2023-12-16 DIAGNOSIS — Z7689 Persons encountering health services in other specified circumstances: Secondary | ICD-10-CM

## 2023-12-16 MED ORDER — IBUPROFEN 200 MG PO CAPS: 1.0000 | ORAL_CAPSULE | Freq: Three times a day (TID) | ORAL | Status: DC | PRN

## 2023-12-16 MED ORDER — DIPHENHYDRAMINE HCL 25 MG PO TABS: 25.0000 mg | ORAL_TABLET | Freq: Four times a day (QID) | ORAL | Status: AC | PRN

## 2023-12-16 MED ORDER — ACETAMINOPHEN 325 MG PO TABS: 650.0000 mg | ORAL_TABLET | Freq: Four times a day (QID) | ORAL | Status: DC | PRN
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
## 2023-12-16 NOTE — Telephone Encounter (Signed)
 Patient will speak with provider virtually

## 2023-12-16 NOTE — Telephone Encounter (Signed)
 Copied from CRM [id number]. Topic: Appointments - Scheduling Inquiry for Clinic >> Dec 16, 2023  7:37 AM Joyce [name] wrote: Reason for CRM: The patient would like to be contacted by a member of practice staff to discuss rescheduling their new patient appointment,the patient needs their appointment to be rescheduled prior to June 3rd to ensure that they are cleared for a total knee replacement of their right knee   Please contact the patient further when possible to discuss available/potential options for rescheduling

## 2023-12-18 ENCOUNTER — Telehealth: Payer: Self-pay | Admitting: Adult Medicine

## 2023-12-18 NOTE — Telephone Encounter (Signed)
 Surgical Clearance Noted Copied Scanned Original in provider box Copy at front desk

## 2023-12-22 ENCOUNTER — Other Ambulatory Visit: Payer: Self-pay

## 2023-12-24 NOTE — Progress Notes (Signed)
 Anesthesia Review:  PCP: Jacolyn Matar        12/16/23- ov for preop clearance  Cardiologist : Cahil with [facility] - clearance from [facility] dated 11/24/23 in Media Tab  PT has kidney disease but has not been seen in a while per pt .    PPM/ ICD: Device Orders: Rep Notified:  Chest x-ray : EKG : 12/31/23  Echo : 12/10/23  Stress test: Cardiac Cath :   Activity level: can do a flight of stairs without difficulty  Sleep Study/ CPAP : none  Fasting Blood Sugar :      / Checks Blood Sugar -- times a day:    Blood Thinner/ Instructions /Last Dose: ASA / Instructions/ Last Dose :    CBC with hgb of 10.4 rotued to DR Bernard Brick on 12/31/23.    BMP done 12/31/23 routed to Dr Bernard Brick on 12/31/23.

## 2023-12-26 NOTE — Patient Instructions (Signed)
 SURGICAL WAITING ROOM VISITATION  Patients having surgery or a procedure may have no more than 2 support people in the waiting area - these visitors may rotate.    Children under the age of 54 must have an adult with them who is not the patient.  Due to an increase in RSV and influenza rates and associated hospitalizations, children ages 86 and under may not visit patients in [facility] hospitals.  Visitors with respiratory illnesses are discouraged from visiting and should remain at home.  If the patient needs to stay at the hospital during part of their recovery, the visitor guidelines for inpatient rooms apply. Pre-op nurse will coordinate an appropriate time for 1 support person to accompany patient in pre-op.  This support person may not rotate.    Please refer to the [facility] website for the visitor guidelines for Inpatients (after your surgery is over and you are in a regular room).       Your procedure is scheduled on:  01/06/2024    Report to [facility] Main Entrance    Report to admitting at   130 pm    Call this number if you have problems the morning of surgery [phone]   Do not eat food :After Midnight.   After Midnight you may have the following liquids until _ 100 pm   DAY OF SURGERY  Water  Non-Citrus Juices (without pulp, NO RED-Apple, White grape, White cranberry) Black Coffee (NO MILK/CREAM OR CREAMERS, sugar ok)  Clear Tea (NO MILK/CREAM OR CREAMERS, sugar ok) regular and decaf                             Plain Jell-O (NO RED)                                           Fruit ices (not with fruit pulp, NO RED)                                     Popsicles (NO RED)                                                               Sports drinks like G        The day of surgery:  Drink ONE (1) Pre-Surgery Clear Ensure or G2 at  100 pm ( have completed by )  the morning of surgery. Drink in one sitting. Do not sip.  This drink was given to you during  your hospital  pre-op appointment visit. Nothing else to drink after completing the  Pre-Surgery Clear Ensure or G2.          If you have questions, please contact your surgeon's office.       Oral Hygiene is also important to reduce your risk of infection.                                    Remember - BRUSH YOUR TEETH THE  MORNING OF SURGERY WITH YOUR REGULAR TOOTHPASTE  DENTURES WILL BE REMOVED PRIOR TO SURGERY PLEASE DO NOT APPLY "Poly grip" OR ADHESIVES!!!   Do NOT smoke after Midnight   Stop all vitamins and herbal supplements 7 days before surgery. Amlodiopine, coreg, gabapentin , omeprazole    Take these medicines the morning of surgery with A SIP OF WATER :   DO NOT TAKE ANY ORAL DIABETIC MEDICATIONS DAY OF YOUR SURGERY  Bring CPAP mask and tubing day of surgery.                              You may not have any metal on your body including hair pins, jewelry, and body piercing             Do not wear make-up, lotions, powders, perfumes/cologne, or deodorant  Do not wear nail polish including gel and S&S, artificial/acrylic nails, or any other type of covering on natural nails including finger and toenails. If you have artificial nails, gel coating, etc. that needs to be removed by a nail salon please have this removed prior to surgery or surgery may need to be canceled/ delayed if the surgeon/ anesthesia feels like they are unable to be safely monitored.   Do not shave  48 hours prior to surgery.               Men may shave face and neck.   Do not bring valuables to the hospital. [facility] IS NOT             RESPONSIBLE   FOR VALUABLES.   Contacts, glasses, dentures or bridgework may not be worn into surgery.   Bring small overnight bag day of surgery.   DO NOT BRING YOUR HOME MEDICATIONS TO THE HOSPITAL. PHARMACY WILL DISPENSE MEDICATIONS LISTED ON YOUR MEDICATION LIST TO YOU DURING YOUR ADMISSION IN THE HOSPITAL!    Patients discharged on the day of surgery will  not be allowed to drive home.  Someone NEEDS to stay with you for the first 24 hours after anesthesia.   Special Instructions: Bring a copy of your healthcare power of attorney and living will documents the day of surgery if you haven't scanned them before.              Please read over the following fact sheets you were given: IF YOU HAVE QUESTIONS ABOUT YOUR PRE-OP INSTRUCTIONS PLEASE CALL [phone]   If you received a COVID test during your pre-op visit  it is requested that you wear a mask when out in public, stay away from anyone that may not be feeling well and notify your surgeon if you develop symptoms. If you test positive for Covid or have been in contact with anyone that has tested positive in the last 10 days please notify you surgeon.      Pre-operative 5 CHG Bath Instructions   You can play a key role in reducing the risk of infection after surgery. Your skin needs to be as free of germs as possible. You can reduce the number of germs on your skin by washing with CHG (chlorhexidine  gluconate) soap before surgery. CHG is an antiseptic soap that kills germs and continues to kill germs even after washing.   DO NOT use if you have an allergy to chlorhexidine /CHG or antibacterial soaps. If your skin becomes reddened or irritated, stop using the CHG and notify one of our RNs at [phone].  Please shower with the CHG soap starting 4 days before surgery using the following schedule:     Please keep in mind the following:  DO NOT shave, including legs and underarms, starting the day of your first shower.   You may shave your face at any point before/day of surgery.  Place clean sheets on your bed the day you start using CHG soap. Use a clean washcloth (not used since being washed) for each shower. DO NOT sleep with pets once you start using the CHG.   CHG Shower Instructions:  If you choose to wash your hair and private area, wash first with your normal shampoo/soap.  After  you use shampoo/soap, rinse your hair and body thoroughly to remove shampoo/soap residue.  Turn the water  OFF and apply about 3 tablespoons (45 ml) of CHG soap to a CLEAN washcloth.  Apply CHG soap ONLY FROM YOUR NECK DOWN TO YOUR TOES (washing for 3-5 minutes)  DO NOT use CHG soap on face, private areas, open wounds, or sores.  Pay special attention to the area where your surgery is being performed.  If you are having back surgery, having someone wash your back for you may be helpful. Wait 2 minutes after CHG soap is applied, then you may rinse off the CHG soap.  Pat dry with a clean towel  Put on clean clothes/pajamas   If you choose to wear lotion, please use ONLY the CHG-compatible lotions on the back of this paper.     Additional instructions for the day of surgery: DO NOT APPLY any lotions, deodorants, cologne, or perfumes.   Put on clean/comfortable clothes.  Brush your teeth.  Ask your nurse before applying any prescription medications to the skin.      CHG Compatible Lotions   Aveeno Moisturizing lotion  Cetaphil Moisturizing Cream  Cetaphil Moisturizing Lotion  Clairol Herbal Essence Moisturizing Lotion, Dry Skin  Clairol Herbal Essence Moisturizing Lotion, Extra Dry Skin  Clairol Herbal Essence Moisturizing Lotion, Normal Skin  Curel Age Defying Therapeutic Moisturizing Lotion with Alpha Hydroxy  Curel Extreme Care Body Lotion  Curel Soothing Hands Moisturizing Hand Lotion  Curel Therapeutic Moisturizing Cream, Fragrance-Free  Curel Therapeutic Moisturizing Lotion, Fragrance-Free  Curel Therapeutic Moisturizing Lotion, Original Formula  Eucerin Daily Replenishing Lotion  Eucerin Dry Skin Therapy Plus Alpha Hydroxy Crme  Eucerin Dry Skin Therapy Plus Alpha Hydroxy Lotion  Eucerin Original Crme  Eucerin Original Lotion  Eucerin Plus Crme Eucerin Plus Lotion  Eucerin TriLipid Replenishing Lotion  Keri Anti-Bacterial Hand Lotion  Keri Deep Conditioning Original  Lotion Dry Skin Formula Softly Scented  Keri Deep Conditioning Original Lotion, Fragrance Free Sensitive Skin Formula  Keri Lotion Fast Absorbing Fragrance Free Sensitive Skin Formula  Keri Lotion Fast Absorbing Softly Scented Dry Skin Formula  Keri Original Lotion  Keri Skin Renewal Lotion Keri Silky Smooth Lotion  Keri Silky Smooth Sensitive Skin Lotion  Nivea Body Creamy Conditioning Oil  Nivea Body Extra Enriched Teacher, adult education Moisturizing Lotion Nivea Crme  Nivea Skin Firming Lotion  NutraDerm 30 Skin Lotion  NutraDerm Skin Lotion  NutraDerm Therapeutic Skin Cream  NutraDerm Therapeutic Skin Lotion  ProShield Protective Hand Cream  Provon moisturizing lotion

## 2023-12-31 ENCOUNTER — Encounter (HOSPITAL_COMMUNITY): Payer: Self-pay

## 2023-12-31 ENCOUNTER — Encounter (HOSPITAL_COMMUNITY)
Admission: RE | Admit: 2023-12-31 | Discharge: 2023-12-31 | Disposition: A | Discharge status: Home / Self Care | Source: Ambulatory Visit | Attending: Orthopedic Surgery | Admitting: Orthopedic Surgery

## 2023-12-31 ENCOUNTER — Other Ambulatory Visit: Payer: Self-pay

## 2023-12-31 VITALS — BP 187/76 | HR 64 | Temp 99.1°F | Resp 16 | Ht 67.0 in | Wt 255.0 lb

## 2023-12-31 DIAGNOSIS — Y792 Prosthetic and other implants, materials and accessory orthopedic devices associated with adverse incidents: Secondary | ICD-10-CM | POA: Insufficient documentation

## 2023-12-31 DIAGNOSIS — Z01818 Encounter for other preprocedural examination: Secondary | ICD-10-CM | POA: Diagnosis present

## 2023-12-31 DIAGNOSIS — I129 Hypertensive chronic kidney disease with stage 1 through stage 4 chronic kidney disease, or unspecified chronic kidney disease: Secondary | ICD-10-CM | POA: Diagnosis not present

## 2023-12-31 DIAGNOSIS — N183 Chronic kidney disease, stage 3 unspecified: Secondary | ICD-10-CM | POA: Insufficient documentation

## 2023-12-31 DIAGNOSIS — Z96651 Presence of right artificial knee joint: Secondary | ICD-10-CM | POA: Insufficient documentation

## 2023-12-31 DIAGNOSIS — Z8673 Personal history of transient ischemic attack (TIA), and cerebral infarction without residual deficits: Secondary | ICD-10-CM | POA: Insufficient documentation

## 2023-12-31 DIAGNOSIS — Z87891 Personal history of nicotine dependence: Secondary | ICD-10-CM | POA: Diagnosis not present

## 2023-12-31 DIAGNOSIS — T84033A Mechanical loosening of internal left knee prosthetic joint, initial encounter: Secondary | ICD-10-CM | POA: Insufficient documentation

## 2023-12-31 DIAGNOSIS — D631 Anemia in chronic kidney disease: Secondary | ICD-10-CM | POA: Insufficient documentation

## 2023-12-31 HISTORY — DX: Anemia, unspecified: D64.9

## 2023-12-31 LAB — CBC
HCT: 33.5 % — ABNORMAL LOW (ref 36.0–46.0)
Hemoglobin: 10.4 g/dL — ABNORMAL LOW (ref 12.0–15.0)
MCH: 26.3 pg (ref 26.0–34.0)
MCHC: 31 g/dL (ref 30.0–36.0)
MCV: 84.6 fL (ref 80.0–100.0)
Platelets: 321 10*3/uL (ref 150–400)
RBC: 3.96 MIL/uL (ref 3.87–5.11)
RDW: 15 % (ref 11.5–15.5)
WBC: 7.4 10*3/uL (ref 4.0–10.5)
nRBC: 0 % (ref 0.0–0.2)

## 2023-12-31 LAB — SURGICAL PCR SCREEN
MRSA, PCR: NEGATIVE
Staphylococcus aureus: NEGATIVE

## 2023-12-31 LAB — BASIC METABOLIC PANEL WITH GFR
Anion gap: 12 (ref 5–15)
BUN: 25 mg/dL — ABNORMAL HIGH (ref 8–23)
CO2: 24 mmol/L (ref 22–32)
Calcium: 8.3 mg/dL — ABNORMAL LOW (ref 8.9–10.3)
Chloride: 104 mmol/L (ref 98–111)
Creatinine, Ser: 2.05 mg/dL — ABNORMAL HIGH (ref 0.44–1.00)
GFR, Estimated: 26 mL/min — ABNORMAL LOW (ref >60)
Glucose, Bld: 124 mg/dL — ABNORMAL HIGH (ref 70–99)
Potassium: 3.8 mmol/L (ref 3.5–5.1)
Sodium: 140 mmol/L (ref 135–145)
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
## 2024-01-02 NOTE — Anesthesia Preprocedure Evaluation (Addendum)
 Anesthesia Evaluation  Patient identified by MRN, date of birth, ID band Patient awake    Reviewed: Allergy & Precautions, H&P , NPO status , Patient's Chart, lab work & pertinent test results  Airway Mallampati: II   Neck ROM: full    Dental   Pulmonary former smoker   breath sounds clear to auscultation       Cardiovascular hypertension,  Rhythm:regular Rate:Normal     Neuro/Psych  PSYCHIATRIC DISORDERS  Depression    CVA    GI/Hepatic ,GERD  ,,  Endo/Other    Renal/GU Renal InsufficiencyRenal disease     Musculoskeletal  (+) Arthritis ,    Abdominal   Peds  Hematology   Anesthesia Other Findings   Reproductive/Obstetrics                             Anesthesia Physical Anesthesia Plan  ASA: 3  Anesthesia Plan: General   Post-op Pain Management: Regional block*   Induction: Intravenous  PONV Risk Score and Plan: 3 and Ondansetron , Dexamethasone , Midazolam  and Treatment may vary due to age or medical condition  Airway Management Planned: LMA  Additional Equipment:   Intra-op Plan:   Post-operative Plan: Extubation in OR  Informed Consent: I have reviewed the patients History and Physical, chart, labs and discussed the procedure including the risks, benefits and alternatives for the proposed anesthesia with the patient or authorized representative who has indicated his/her understanding and acceptance.     Dental advisory given  Plan Discussed with: CRNA, Anesthesiologist and Surgeon  Anesthesia Plan Comments: (See PAT note 12/31/2023)       Anesthesia Quick Evaluation

## 2024-01-06 ENCOUNTER — Other Ambulatory Visit: Payer: Self-pay

## 2024-01-06 ENCOUNTER — Inpatient Hospital Stay (HOSPITAL_COMMUNITY)

## 2024-01-06 ENCOUNTER — Inpatient Hospital Stay (HOSPITAL_COMMUNITY)
Admission: RE | Admit: 2024-01-06 | Discharge: 2024-01-08 | DRG: 467 | Disposition: A | Discharge status: Home / Self Care | Source: Ambulatory Visit | Attending: Orthopedic Surgery | Admitting: Orthopedic Surgery

## 2024-01-06 ENCOUNTER — Encounter (HOSPITAL_COMMUNITY): Payer: Self-pay | Admitting: Orthopedic Surgery

## 2024-01-06 ENCOUNTER — Inpatient Hospital Stay (HOSPITAL_COMMUNITY): Payer: Self-pay | Admitting: Physician Assistant

## 2024-01-06 ENCOUNTER — Encounter (HOSPITAL_COMMUNITY)
Admission: RE | Disposition: A | Discharge status: Home / Self Care | Payer: Self-pay | Source: Ambulatory Visit | Attending: Orthopedic Surgery

## 2024-01-06 DIAGNOSIS — I129 Hypertensive chronic kidney disease with stage 1 through stage 4 chronic kidney disease, or unspecified chronic kidney disease: Secondary | ICD-10-CM

## 2024-01-06 DIAGNOSIS — I69351 Hemiplegia and hemiparesis following cerebral infarction affecting right dominant side: Secondary | ICD-10-CM | POA: Diagnosis not present

## 2024-01-06 DIAGNOSIS — Z96651 Presence of right artificial knee joint: Secondary | ICD-10-CM | POA: Diagnosis present

## 2024-01-06 DIAGNOSIS — M1990 Unspecified osteoarthritis, unspecified site: Secondary | ICD-10-CM | POA: Diagnosis present

## 2024-01-06 DIAGNOSIS — T84033A Mechanical loosening of internal left knee prosthetic joint, initial encounter: Secondary | ICD-10-CM

## 2024-01-06 DIAGNOSIS — E739 Lactose intolerance, unspecified: Secondary | ICD-10-CM | POA: Diagnosis present

## 2024-01-06 DIAGNOSIS — Z83438 Family history of other disorder of lipoprotein metabolism and other lipidemia: Secondary | ICD-10-CM

## 2024-01-06 DIAGNOSIS — Z886 Allergy status to analgesic agent status: Secondary | ICD-10-CM | POA: Diagnosis not present

## 2024-01-06 DIAGNOSIS — Y792 Prosthetic and other implants, materials and accessory orthopedic devices associated with adverse incidents: Secondary | ICD-10-CM | POA: Diagnosis present

## 2024-01-06 DIAGNOSIS — R7982 Elevated C-reactive protein (CRP): Secondary | ICD-10-CM | POA: Diagnosis present

## 2024-01-06 DIAGNOSIS — Z90710 Acquired absence of both cervix and uterus: Secondary | ICD-10-CM

## 2024-01-06 DIAGNOSIS — Z8249 Family history of ischemic heart disease and other diseases of the circulatory system: Secondary | ICD-10-CM

## 2024-01-06 DIAGNOSIS — K219 Gastro-esophageal reflux disease without esophagitis: Secondary | ICD-10-CM | POA: Diagnosis present

## 2024-01-06 DIAGNOSIS — Z9109 Other allergy status, other than to drugs and biological substances: Secondary | ICD-10-CM | POA: Diagnosis not present

## 2024-01-06 DIAGNOSIS — E669 Obesity, unspecified: Secondary | ICD-10-CM | POA: Diagnosis present

## 2024-01-06 DIAGNOSIS — Z01818 Encounter for other preprocedural examination: Secondary | ICD-10-CM

## 2024-01-06 DIAGNOSIS — Z83719 Family history of colon polyps, unspecified: Secondary | ICD-10-CM

## 2024-01-06 DIAGNOSIS — E785 Hyperlipidemia, unspecified: Secondary | ICD-10-CM | POA: Diagnosis present

## 2024-01-06 DIAGNOSIS — I679 Cerebrovascular disease, unspecified: Secondary | ICD-10-CM

## 2024-01-06 DIAGNOSIS — Z6839 Body mass index (BMI) 39.0-39.9, adult: Secondary | ICD-10-CM | POA: Diagnosis not present

## 2024-01-06 DIAGNOSIS — F17210 Nicotine dependence, cigarettes, uncomplicated: Secondary | ICD-10-CM | POA: Diagnosis present

## 2024-01-06 DIAGNOSIS — N1832 Chronic kidney disease, stage 3b: Secondary | ICD-10-CM | POA: Diagnosis present

## 2024-01-06 DIAGNOSIS — Z79899 Other long term (current) drug therapy: Secondary | ICD-10-CM

## 2024-01-06 DIAGNOSIS — Z9049 Acquired absence of other specified parts of digestive tract: Secondary | ICD-10-CM

## 2024-01-06 DIAGNOSIS — Z96652 Presence of left artificial knee joint: Principal | ICD-10-CM

## 2024-01-06 DIAGNOSIS — Z981 Arthrodesis status: Secondary | ICD-10-CM

## 2024-01-06 HISTORY — PX: TOTAL KNEE REVISION: SHX996

## 2024-01-06 LAB — TYPE AND SCREEN
ABO/RH(D): O POS
Antibody Screen: NEGATIVE

## 2024-01-06 SURGERY — TOTAL KNEE REVISION
Anesthesia: General | Site: Knee | Laterality: Left

## 2024-01-06 MED ORDER — 0.9 % SODIUM CHLORIDE (POUR BTL) OPTIME
TOPICAL | Status: DC | PRN
  Administered 2024-01-06: 1000 mL

## 2024-01-06 MED ORDER — DEXAMETHASONE SODIUM PHOSPHATE 10 MG/ML IJ SOLN
8.0000 mg | Freq: Once | INTRAMUSCULAR | Status: AC
  Administered 2024-01-06: 8 mg via INTRAVENOUS

## 2024-01-06 MED ORDER — SODIUM CHLORIDE 0.9 % IR SOLN
Status: DC | PRN
  Administered 2024-01-06: 3000 mL

## 2024-01-06 MED ORDER — TRANEXAMIC ACID-NACL 1000-0.7 MG/100ML-% IV SOLN
1000.0000 mg | INTRAVENOUS | Status: AC
  Administered 2024-01-06: 1000 mg via INTRAVENOUS
  Filled 2024-01-06: qty 100

## 2024-01-06 MED ORDER — CEFAZOLIN SODIUM-DEXTROSE 2-4 GM/100ML-% IV SOLN
2.0000 g | Freq: Four times a day (QID) | INTRAVENOUS | Status: AC
  Administered 2024-01-06 – 2024-01-07 (×2): 2 g via INTRAVENOUS
  Filled 2024-01-06 (×2): qty 100

## 2024-01-06 MED ORDER — ACETAMINOPHEN 500 MG PO TABS
1000.0000 mg | ORAL_TABLET | Freq: Four times a day (QID) | ORAL | Status: DC
  Administered 2024-01-06 – 2024-01-08 (×6): 1000 mg via ORAL
  Filled 2024-01-06 (×6): qty 2

## 2024-01-06 MED ORDER — OXYCODONE HCL 5 MG PO TABS
ORAL_TABLET | ORAL | Status: AC
  Filled 2024-01-06: qty 1

## 2024-01-06 MED ORDER — SENNA 8.6 MG PO TABS
2.0000 | ORAL_TABLET | Freq: Every day | ORAL | Status: DC
  Administered 2024-01-06 – 2024-01-07 (×2): 17.2 mg via ORAL
  Filled 2024-01-06 (×2): qty 2

## 2024-01-06 MED ORDER — SODIUM CHLORIDE (PF) 0.9 % IJ SOLN
INTRAMUSCULAR | Status: DC | PRN
  Administered 2024-01-06: 81 mL

## 2024-01-06 MED ORDER — SODIUM CHLORIDE 0.9% FLUSH
3.0000 mL | Freq: Two times a day (BID) | INTRAVENOUS | Status: DC
  Administered 2024-01-07: 10 mL via INTRAVENOUS
  Administered 2024-01-07: 3 mL via INTRAVENOUS

## 2024-01-06 MED ORDER — SODIUM CHLORIDE 0.9% FLUSH: 3.0000 mL | INTRAVENOUS | Status: DC | PRN

## 2024-01-06 MED ORDER — OXYCODONE HCL 5 MG/5ML PO SOLN: 5.0000 mg | Freq: Once | ORAL | Status: AC | PRN

## 2024-01-06 MED ORDER — FENTANYL CITRATE PF 50 MCG/ML IJ SOSY
25.0000 ug | PREFILLED_SYRINGE | INTRAMUSCULAR | Status: DC | PRN
  Administered 2024-01-06: 50 ug via INTRAVENOUS
  Administered 2024-01-06 (×2): 25 ug via INTRAVENOUS

## 2024-01-06 MED ORDER — FENTANYL CITRATE (PF) 100 MCG/2ML IJ SOLN
INTRAMUSCULAR | Status: DC | PRN
  Administered 2024-01-06 (×4): 25 ug via INTRAVENOUS

## 2024-01-06 MED ORDER — ONDANSETRON HCL 4 MG/2ML IJ SOLN: 4.0000 mg | Freq: Four times a day (QID) | INTRAMUSCULAR | Status: DC | PRN

## 2024-01-06 MED ORDER — TORSEMIDE 20 MG PO TABS
10.0000 mg | ORAL_TABLET | Freq: Every day | ORAL | Status: DC
  Administered 2024-01-07 – 2024-01-08 (×2): 10 mg via ORAL
  Filled 2024-01-06 (×2): qty 1

## 2024-01-06 MED ORDER — DEXMEDETOMIDINE HCL IN NACL 80 MCG/20ML IV SOLN
INTRAVENOUS | Status: DC | PRN
  Administered 2024-01-06: 8 ug via INTRAVENOUS
  Administered 2024-01-06: 4 ug via INTRAVENOUS

## 2024-01-06 MED ORDER — ONDANSETRON HCL 4 MG PO TABS: 4.0000 mg | ORAL_TABLET | Freq: Four times a day (QID) | ORAL | Status: DC | PRN

## 2024-01-06 MED ORDER — OXYCODONE HCL 5 MG PO TABS
10.0000 mg | ORAL_TABLET | ORAL | Status: DC | PRN
  Administered 2024-01-06: 10 mg via ORAL
  Administered 2024-01-07 – 2024-01-08 (×4): 15 mg via ORAL
  Filled 2024-01-06: qty 3
  Filled 2024-01-06: qty 2
  Filled 2024-01-06 (×3): qty 3

## 2024-01-06 MED ORDER — MIDAZOLAM HCL 2 MG/2ML IJ SOLN: 1.0000 mg | INTRAMUSCULAR | Status: DC

## 2024-01-06 MED ORDER — EPHEDRINE SULFATE-NACL 50-0.9 MG/10ML-% IV SOSY
PREFILLED_SYRINGE | INTRAVENOUS | Status: DC | PRN
  Administered 2024-01-06: 5 mg via INTRAVENOUS
  Administered 2024-01-06: 10 mg via INTRAVENOUS

## 2024-01-06 MED ORDER — FENTANYL CITRATE PF 50 MCG/ML IJ SOSY
PREFILLED_SYRINGE | INTRAMUSCULAR | Status: AC
  Filled 2024-01-06: qty 1

## 2024-01-06 MED ORDER — POVIDONE-IODINE 10 % EX SWAB: 2.0000 | Freq: Once | CUTANEOUS | Status: DC

## 2024-01-06 MED ORDER — MENTHOL 3 MG MT LOZG: 1.0000 | LOZENGE | OROMUCOSAL | Status: DC | PRN

## 2024-01-06 MED ORDER — FENTANYL CITRATE (PF) 100 MCG/2ML IJ SOLN
INTRAMUSCULAR | Status: AC
  Filled 2024-01-06: qty 2

## 2024-01-06 MED ORDER — PNEUMOCOCCAL 20-VAL CONJ VACC 0.5 ML IM SUSY
0.5000 mL | PREFILLED_SYRINGE | INTRAMUSCULAR | Status: DC
  Filled 2024-01-06: qty 0.5

## 2024-01-06 MED ORDER — GABAPENTIN 300 MG PO CAPS
300.0000 mg | ORAL_CAPSULE | Freq: Two times a day (BID) | ORAL | Status: DC
  Administered 2024-01-06 – 2024-01-08 (×4): 300 mg via ORAL
  Filled 2024-01-06 (×4): qty 1

## 2024-01-06 MED ORDER — ORAL CARE MOUTH RINSE: 15.0000 mL | Freq: Once | OROMUCOSAL | Status: AC

## 2024-01-06 MED ORDER — HYDROMORPHONE HCL 2 MG/ML IJ SOLN
INTRAMUSCULAR | Status: AC
  Filled 2024-01-06: qty 1

## 2024-01-06 MED ORDER — ONDANSETRON HCL 4 MG/2ML IJ SOLN
INTRAMUSCULAR | Status: DC | PRN
Start: 2024-01-06 — End: 2024-01-06
  Administered 2024-01-06: 4 mg via INTRAVENOUS

## 2024-01-06 MED ORDER — METOCLOPRAMIDE HCL 5 MG PO TABS: 5.0000 mg | ORAL_TABLET | Freq: Three times a day (TID) | ORAL | Status: DC | PRN

## 2024-01-06 MED ORDER — TRANEXAMIC ACID-NACL 1000-0.7 MG/100ML-% IV SOLN
1000.0000 mg | Freq: Once | INTRAVENOUS | Status: AC
  Administered 2024-01-06: 1000 mg via INTRAVENOUS
  Filled 2024-01-06: qty 100

## 2024-01-06 MED ORDER — DEXMEDETOMIDINE HCL IN NACL 80 MCG/20ML IV SOLN
INTRAVENOUS | Status: AC
  Filled 2024-01-06: qty 20

## 2024-01-06 MED ORDER — OXYCODONE HCL 5 MG PO TABS
5.0000 mg | ORAL_TABLET | ORAL | Status: DC | PRN
  Administered 2024-01-08: 10 mg via ORAL
  Filled 2024-01-06: qty 2

## 2024-01-06 MED ORDER — PROPOFOL 10 MG/ML IV BOLUS
INTRAVENOUS | Status: DC | PRN
  Administered 2024-01-06: 190 mg via INTRAVENOUS

## 2024-01-06 MED ORDER — BISACODYL 10 MG RE SUPP: 10.0000 mg | Freq: Every day | RECTAL | Status: DC | PRN

## 2024-01-06 MED ORDER — PANTOPRAZOLE SODIUM 40 MG PO TBEC
40.0000 mg | DELAYED_RELEASE_TABLET | Freq: Every day | ORAL | Status: DC
  Administered 2024-01-06 – 2024-01-08 (×3): 40 mg via ORAL
  Filled 2024-01-06 (×3): qty 1

## 2024-01-06 MED ORDER — DEXAMETHASONE SODIUM PHOSPHATE 10 MG/ML IJ SOLN
10.0000 mg | Freq: Once | INTRAMUSCULAR | Status: AC
  Administered 2024-01-07: 10 mg via INTRAVENOUS
  Filled 2024-01-06: qty 1

## 2024-01-06 MED ORDER — VANCOMYCIN HCL 1000 MG IV SOLR
INTRAVENOUS | Status: DC | PRN
  Administered 2024-01-06: 1000 mg via TOPICAL
  Administered 2024-01-06: 1000 mg

## 2024-01-06 MED ORDER — METOCLOPRAMIDE HCL 5 MG/ML IJ SOLN: 5.0000 mg | Freq: Three times a day (TID) | INTRAMUSCULAR | Status: DC | PRN

## 2024-01-06 MED ORDER — EPHEDRINE 5 MG/ML INJ
INTRAVENOUS | Status: AC
  Filled 2024-01-06: qty 5

## 2024-01-06 MED ORDER — AMLODIPINE BESYLATE 10 MG PO TABS
10.0000 mg | ORAL_TABLET | Freq: Every day | ORAL | Status: DC
  Administered 2024-01-07 – 2024-01-08 (×2): 10 mg via ORAL
  Filled 2024-01-06 (×2): qty 1

## 2024-01-06 MED ORDER — ALUM & MAG HYDROXIDE-SIMETH 200-200-20 MG/5ML PO SUSP: 30.0000 mL | ORAL | Status: DC | PRN

## 2024-01-06 MED ORDER — POLYETHYLENE GLYCOL 3350 17 G PO PACK
17.0000 g | PACK | Freq: Two times a day (BID) | ORAL | Status: DC
  Administered 2024-01-06 – 2024-01-08 (×4): 17 g via ORAL
  Filled 2024-01-06 (×4): qty 1

## 2024-01-06 MED ORDER — PROPOFOL 500 MG/50ML IV EMUL
INTRAVENOUS | Status: DC | PRN
  Administered 2024-01-06: 75 ug/kg/min via INTRAVENOUS

## 2024-01-06 MED ORDER — DIPHENHYDRAMINE HCL 12.5 MG/5ML PO ELIX
12.5000 mg | ORAL_SOLUTION | ORAL | Status: DC | PRN
  Administered 2024-01-07 – 2024-01-08 (×3): 25 mg via ORAL
  Filled 2024-01-06 (×4): qty 10

## 2024-01-06 MED ORDER — CYCLOBENZAPRINE HCL 10 MG PO TABS
10.0000 mg | ORAL_TABLET | Freq: Three times a day (TID) | ORAL | Status: DC | PRN
  Administered 2024-01-06 – 2024-01-07 (×2): 10 mg via ORAL
  Filled 2024-01-06 (×2): qty 1

## 2024-01-06 MED ORDER — OXYCODONE HCL 5 MG PO TABS
5.0000 mg | ORAL_TABLET | Freq: Once | ORAL | Status: AC | PRN
  Administered 2024-01-06: 5 mg via ORAL

## 2024-01-06 MED ORDER — PROPOFOL 10 MG/ML IV BOLUS
INTRAVENOUS | Status: AC
Start: 2024-01-06 — End: ?
  Filled 2024-01-06: qty 20

## 2024-01-06 MED ORDER — ATORVASTATIN CALCIUM 10 MG PO TABS
10.0000 mg | ORAL_TABLET | Freq: Every day | ORAL | Status: DC
  Administered 2024-01-06 – 2024-01-08 (×3): 10 mg via ORAL
  Filled 2024-01-06 (×3): qty 1

## 2024-01-06 MED ORDER — SODIUM CHLORIDE (PF) 0.9 % IJ SOLN
INTRAMUSCULAR | Status: AC
  Filled 2024-01-06: qty 10

## 2024-01-06 MED ORDER — CARVEDILOL 25 MG PO TABS
25.0000 mg | ORAL_TABLET | Freq: Two times a day (BID) | ORAL | Status: DC
  Administered 2024-01-07 – 2024-01-08 (×3): 25 mg via ORAL
  Filled 2024-01-06 (×3): qty 1

## 2024-01-06 MED ORDER — HYDROMORPHONE HCL 1 MG/ML IJ SOLN
0.5000 mg | INTRAMUSCULAR | Status: DC | PRN
  Administered 2024-01-07 (×2): 0.5 mg via INTRAVENOUS
  Administered 2024-01-07 (×2): 1 mg via INTRAVENOUS
  Filled 2024-01-06 (×4): qty 1

## 2024-01-06 MED ORDER — FENTANYL CITRATE PF 50 MCG/ML IJ SOSY
50.0000 ug | PREFILLED_SYRINGE | INTRAMUSCULAR | Status: AC
  Administered 2024-01-06: 50 ug via INTRAVENOUS
  Filled 2024-01-06: qty 2

## 2024-01-06 MED ORDER — CEFAZOLIN SODIUM-DEXTROSE 2-4 GM/100ML-% IV SOLN
2.0000 g | INTRAVENOUS | Status: AC
  Administered 2024-01-06: 2 g via INTRAVENOUS
  Filled 2024-01-06: qty 100

## 2024-01-06 MED ORDER — VANCOMYCIN HCL 1000 MG IV SOLR
INTRAVENOUS | Status: AC
Start: 2024-01-06 — End: ?
  Filled 2024-01-06: qty 60

## 2024-01-06 MED ORDER — HYDROMORPHONE HCL 1 MG/ML IJ SOLN
INTRAMUSCULAR | Status: DC | PRN
  Administered 2024-01-06: .2 mg via INTRAVENOUS
  Administered 2024-01-06: .4 mg via INTRAVENOUS

## 2024-01-06 MED ORDER — TRAZODONE HCL 50 MG PO TABS
50.0000 mg | ORAL_TABLET | Freq: Every day | ORAL | Status: DC
  Administered 2024-01-06 – 2024-01-07 (×2): 50 mg via ORAL
  Filled 2024-01-06 (×2): qty 1

## 2024-01-06 MED ORDER — PRONTOSAN WOUND IRRIGATION OPTIME
TOPICAL | Status: DC | PRN
  Administered 2024-01-06: 1 via TOPICAL

## 2024-01-06 MED ORDER — LIDOCAINE HCL (CARDIAC) PF 100 MG/5ML IV SOSY
PREFILLED_SYRINGE | INTRAVENOUS | Status: DC | PRN
Start: 2024-01-06 — End: 2024-01-06
  Administered 2024-01-06: 50 mg via INTRAVENOUS

## 2024-01-06 MED ORDER — AMLODIPINE-OLMESARTAN 10-40 MG PO TABS: 1.0000 | ORAL_TABLET | Freq: Every day | ORAL | Status: DC

## 2024-01-06 MED ORDER — PHENOL 1.4 % MT LIQD
1.0000 | OROMUCOSAL | Status: DC | PRN
Start: 2024-01-06 — End: 2024-01-08

## 2024-01-06 MED ORDER — CHLORHEXIDINE GLUCONATE 0.12 % MT SOLN
15.0000 mL | Freq: Once | OROMUCOSAL | Status: AC
  Administered 2024-01-06: 15 mL via OROMUCOSAL

## 2024-01-06 MED ORDER — PROPOFOL 1000 MG/100ML IV EMUL
INTRAVENOUS | Status: AC
Start: 2024-01-06 — End: ?
  Filled 2024-01-06: qty 100

## 2024-01-06 MED ORDER — IRBESARTAN 150 MG PO TABS
300.0000 mg | ORAL_TABLET | Freq: Every day | ORAL | Status: DC
  Administered 2024-01-07 – 2024-01-08 (×2): 300 mg via ORAL
  Filled 2024-01-06 (×2): qty 2

## 2024-01-06 MED ORDER — RIVAROXABAN 10 MG PO TABS
10.0000 mg | ORAL_TABLET | Freq: Every day | ORAL | Status: DC
  Administered 2024-01-07 – 2024-01-08 (×2): 10 mg via ORAL
  Filled 2024-01-06 (×2): qty 1

## 2024-01-06 MED ORDER — ROPIVACAINE HCL 5 MG/ML IJ SOLN
INTRAMUSCULAR | Status: DC | PRN
  Administered 2024-01-06: 25 mL via PERINEURAL

## 2024-01-06 MED ORDER — PROPOFOL 500 MG/50ML IV EMUL
INTRAVENOUS | Status: AC
  Filled 2024-01-06: qty 50

## 2024-01-06 MED ORDER — LACTATED RINGERS IV SOLN: INTRAVENOUS | Status: DC

## 2024-01-06 SURGICAL SUPPLY — 63 items
AUGMENT DISTAL FEM SZ4 4 KNEE (Miscellaneous) IMPLANT
AUGMENT POST FEM SZ4 4 (Miscellaneous) IMPLANT
BAG COUNTER SPONGE SURGICOUNT (BAG) IMPLANT
BAG DECANTER FOR FLEXI CONT (MISCELLANEOUS) IMPLANT
BAG ZIPLOCK 12X15 (MISCELLANEOUS) IMPLANT
BLADE SAW SGTL 11.0X1.19X90.0M (BLADE) IMPLANT
BLADE SAW SGTL 13.0X1.19X90.0M (BLADE) ×1 IMPLANT
BLADE SAW SGTL 81X20 HD (BLADE) ×1 IMPLANT
BNDG ELASTIC 6INX 5YD STR LF (GAUZE/BANDAGES/DRESSINGS) ×1 IMPLANT
BOWL SMART MIX CTS (DISPOSABLE) IMPLANT
BRUSH FEMORAL CANAL (MISCELLANEOUS) IMPLANT
CEMENT HV SMART SET (Cement) IMPLANT
COMPONENT FEM ATN CR SZ4 LT (Femur) IMPLANT
CONE SLEEVE ATTUNE KNEE SM (Sleeve) IMPLANT
COOLER ICEMAN CLASSIC (MISCELLANEOUS) IMPLANT
COVER SURGICAL LIGHT HANDLE (MISCELLANEOUS) ×1 IMPLANT
CUFF TRNQT CYL 34X4.125X (TOURNIQUET CUFF) ×1 IMPLANT
DERMABOND ADVANCED .7 DNX12 (GAUZE/BANDAGES/DRESSINGS) ×1 IMPLANT
DRAPE INCISE IOBAN 66X45 STRL (DRAPES) ×1 IMPLANT
DRAPE U-SHAPE 47X51 STRL (DRAPES) ×1 IMPLANT
DRSG AQUACEL AG ADV 3.5X10 (GAUZE/BANDAGES/DRESSINGS) ×1 IMPLANT
DRSG AQUACEL AG ADV 3.5X14 (GAUZE/BANDAGES/DRESSINGS) IMPLANT
DURAPREP 26ML APPLICATOR (WOUND CARE) ×2 IMPLANT
ELECT REM PT RETURN 15FT ADLT (MISCELLANEOUS) ×1 IMPLANT
GAUZE SPONGE 2X2 8PLY STRL LF (GAUZE/BANDAGES/DRESSINGS) IMPLANT
GLOVE BIO SURGEON STRL SZ 6 (GLOVE) ×1 IMPLANT
GLOVE BIOGEL PI IND STRL 6.5 (GLOVE) ×1 IMPLANT
GLOVE BIOGEL PI IND STRL 7.5 (GLOVE) ×1 IMPLANT
GLOVE ORTHO TXT STRL SZ7.5 (GLOVE) ×2 IMPLANT
GOWN STRL REUS W/ TWL LRG LVL3 (GOWN DISPOSABLE) ×2 IMPLANT
HOLDER FOLEY CATH W/STRAP (MISCELLANEOUS) IMPLANT
INSERT TIB CMT ATTUNE RP SZ4 (Knees) IMPLANT
INSERT TIB CRS ATTUNE SZ4 8 (Insert) IMPLANT
KIT TURNOVER KIT A (KITS) IMPLANT
MANIFOLD NEPTUNE II (INSTRUMENTS) ×1 IMPLANT
NDL SAFETY ECLIPSE 18X1.5 (NEEDLE) ×1 IMPLANT
NS IRRIG 1000ML POUR BTL (IV SOLUTION) ×1 IMPLANT
PACK TOTAL KNEE CUSTOM (KITS) ×1 IMPLANT
PAD COLD SHLDR WRAP-ON (PAD) IMPLANT
PENCIL SMOKE EVACUATOR (MISCELLANEOUS) ×1 IMPLANT
PIN FIX SIGMA LCS THRD HI (PIN) IMPLANT
PROTECTOR NERVE ULNAR (MISCELLANEOUS) ×1 IMPLANT
RESTRICTOR CEMENT SZ 5 C-STEM (Cement) IMPLANT
SET HNDPC FAN SPRY TIP SCT (DISPOSABLE) ×1 IMPLANT
SET PAD KNEE POSITIONER (MISCELLANEOUS) ×1 IMPLANT
SLEEVE TIB ATTUNE FP 37 (Knees) IMPLANT
SOL .9 NS 3000ML IRR UROMATIC (IV SOLUTION) IMPLANT
SOLUTION IRRIG SURGIPHOR (IV SOLUTION) IMPLANT
SOLUTION PRONTOSAN WOUND 350ML (IRRIGATION / IRRIGATOR) IMPLANT
SPIKE FLUID TRANSFER (MISCELLANEOUS) IMPLANT
STAPLER SKIN PROX 35W (STAPLE) IMPLANT
STEM CEMT ATTUNE 14X80 (Knees) IMPLANT
SUT MNCRL AB 3-0 PS2 18 (SUTURE) ×1 IMPLANT
SUT STRATAFIX PDS+ 0 24IN (SUTURE) ×1 IMPLANT
SUT VIC AB 1 CT1 36 (SUTURE) ×1 IMPLANT
SUT VIC AB 2-0 CT1 TAPERPNT 27 (SUTURE) ×3 IMPLANT
SYR 3ML LL SCALE MARK (SYRINGE) ×1 IMPLANT
TOWER CARTRIDGE SMART MIX (DISPOSABLE) IMPLANT
TRAY FOLEY MTR SLVR 14FR STAT (SET/KITS/TRAYS/PACK) IMPLANT
TRAY FOLEY MTR SLVR 16FR STAT (SET/KITS/TRAYS/PACK) ×1 IMPLANT
TUBE SUCTION HIGH CAP CLEAR NV (SUCTIONS) ×1 IMPLANT
WATER STERILE IRR 1000ML POUR (IV SOLUTION) ×1 IMPLANT
WRAP KNEE MAXI GEL POST OP (GAUZE/BANDAGES/DRESSINGS) ×1 IMPLANT

## 2024-01-06 NOTE — Progress Notes (Signed)
 Dr Joyce Nixon aware of patients blood pressure.  No new orders received.  Stated Will treat in OR

## 2024-01-06 NOTE — Transfer of Care (Signed)
 Immediate Anesthesia Transfer of Care Note  Patient: Joyce [name]  Procedure(s) Performed: TOTAL KNEE REVISION (Left: Knee)  Patient Location: PACU  Anesthesia Type:GA combined with regional for post-op pain  Level of Consciousness: drowsy  Airway & Oxygen Therapy: Patient Spontanous Breathing and Patient connected to face mask oxygen  Post-op Assessment: Report given to RN and Post -op Vital signs reviewed and stable  Post vital signs: Reviewed and stable  Last Vitals:  Vitals Value Taken Time  BP 175/76 01/06/24 1700  Temp    Pulse 66 01/06/24 1704  Resp 12 01/06/24 1704  SpO2 100 % 01/06/24 1704  Vitals shown include unfiled device data.  Last Pain:  Vitals:   01/06/24 1330  TempSrc:   PainSc: 7       Patients Stated Pain Goal: 2 (01/06/24 1330)  Complications: No notable events documented.

## 2024-01-06 NOTE — Discharge Instructions (Addendum)

## 2024-01-06 NOTE — Op Note (Unsigned)
 [name], [name] MEDICAL RECORD NO: [id number] ACCOUNT NO: [id number] DATE OF BIRTH: [birth year] FACILITY: [facility]ATION: [id number] PHYSICIAN: Azalea Lento. 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  PROCEDURE: Revision left total knee arthroplasty.  COMPONENTS USED:  DePuy Attune revision knee system with a size 4 left revision femur with a 4 mm posterior medial augment, 4 mm distal medial and lateral augments, a small metaphyseal cone, and a 14 x 80 cemented stem. The tibia side was a size 4  revision tibial tray with a 37 mm press-fit sleeve and a 14 x 80 cemented stem. We used a size 8 mm CRS Attune revision insert.  SURGEON:  Azalea Lento. Bernard Brick, MD  ASSISTANT: Kim Pen, PA-C. Note that PA, Cleotilde Dago, was present for the entirety of the case from preoperative positioning, perioperative management of the operative extremity, general facilitation of the case, and primary wound closure.  ANESTHESIA: Regional plus general.  DRAINS:  None.  COMPLICATIONS:  None.   BLOOD LOSS: About 300 mL.  TOURNIQUET: Tourniquet was used at 225 mmHg for 60 minutes.  INDICATIONS FOR THE PROCEDURE: The patient is a 68 year old female with a history of left total knee arthroplasty approximately 3 years ago. She has had persistent pain and stiffness in her left knee. Workup was negative for infection including  aspiration. Bone scan had indicated concerns for loosening. After reviewing the risks including persistent pain and stiffness, risks of infection, DVT, component failure, and need for future surgeries, consent was obtained for the benefit of pain relief  for a revision of her left total knee replacement.  DESCRIPTION OF PROCEDURE: The patient was brought to the operative theater. Once adequate  anesthesia, preoperative antibiotics, 2 g of Ancef , as well as 1 g of tranexamic acid  and Decadron , she was positioned supine with a left thigh tourniquet placed.  The left lower extremity was then prepped and draped in a sterile fashion. A timeout was performed identifying the patient, planned procedure and extremity. The leg was exsanguinated and the tourniquet elevated to 225 mmHg. Timeout was performed  identifying the patient's planned procedure and the extremity. Her old incision was excised. Soft tissue planes were created for exposure and further revision surgery. A median arthrotomy was made. As we entered the joint, we encountered clear yellow  synovial fluid. There were no signs of inflammation or infection. At this point, we identified that she had fairly exuberant amount of scarring. I opened up the medial gutter with a proximal medial peel up to the suprapatellar pouch. I performed a  significant scar and synovectomy in this region. I then did the same laterally, exposing laterally along the proximal tibia and then around the patella allowing for more mobility. I then was able to flex the knee by subluxating the patella laterally. I  used an osteotome to remove the old tibial polyethylene. We then brought the knee to extension and allowed for further mobilization of the parapatellar region. I removed extra bone off the lateral facet. The knee was then flexed. We then used a thin ACL  saw and removed the tibial and femoral components. There was minimal bone loss on the tibial side. On the femoral side, there was a little bit of bone loss anteriorly. Once the component was removed and excessive cement was removed, I used an  extramedullary guide on the  tibia and removed bone just underneath the cement bone interface. I removed this and the remaining cement from the keel of the primary tibia. Once this was done, we opened up the distal femur and proximal tibia reaming first  with a 10 mm reamer  and reamed up to 16 mm on both sides. This was to allow for 14 mm cemented stems. Once this was done, I broached the proximal tibia first with the starting broach and then a 37 broach was set up about 5 mm. We set a size 4 tibial  baseplate on this. I then attended to the femur side. I placed a 16 mm reamer and resected minimal bone off the distal femur. We then sized it and determined we would use a size 4 femoral component. We placed a size 4 cutting block to the distal femur  and set the rotation based off the proximal tibial baseplate. It was pinned into place. Anterior, posterior, and chamfer cuts were made. Posteriorly, we identified that we would use a 4 mm posteromedial augment. After this portion, we finished up the  anterior chamfer cut and then the box cut through the appropriate jig. I then prepared the distal femur for a metaphyseal cone size small. At this point, we did a trial reduction with a 4 femur and a stem with the tibial trial already in place. During  the trial, we found that the knee was hyperextended with a 6 mm insert, but felt balanced in flexion. Given this, we elected to use 4 mm augments on the distal femur medially and laterally. At this point, all the trial components were removed. The final  components were opened. The final components were configured on the back table under direct supervision through [employer]. While this was going on, further debridement of the knee was carried out. We measured and placed cement restrictors into the  distal femur and proximal tibia. I did irrigate the canals with a canal brush. Irrigated prior to this. We then injected the posterior capsule of the knee with 0.25% Marcaine  with epinephrine  and 1 mL of Toradol  and saline. We then irrigated the  remainder of the knee with pulse lavage. Once the components were configured, cement was mixed. The final components were then cemented into place. I used an 8-mm insert and found that the knee was  balanced from extension to flexion. The knee was held in  extension until the cement fully cured. Once the cement fully cured, we selected the 8 mm CRS revision tibial insert. It was placed into the tibia and the knee was reduced. The tourniquet was let down after 60 minutes. I did use some of the Prontosan  antimicrobial fluid during the cementing process. The knee was re-irrigated with normal saline solution at the conclusion of the case. The extensor mechanism was then reapproximated using a combination of #1 Vicryl and #1 Stratafix suture. The remainder  of the wound was closed in layers with 2-0 Vicryl and running 3-0 Monocryl. The wound was cleaned, dried, and draped sterilely using surgical glue and Aquacel dressing. She was brought to the recovery room in stable condition, tolerating the procedure  well.        PAA D: 01/06/2024 4:39:12 pm T: 01/06/2024 11:34:00 pm  JOB: [id number]/ [id number]

## 2024-01-06 NOTE — Anesthesia Procedure Notes (Signed)
 Anesthesia Regional Block: Adductor canal block   Pre-Anesthetic Checklist: , timeout performed,  Correct Patient, Correct Site, Correct Laterality,  Correct Procedure, Correct Position, site marked,  Risks and benefits discussed,  Surgical consent,  Pre-op evaluation,  At surgeon's request and post-op pain management  Laterality: Left  Prep: chloraprep       Needles:  Injection technique: Single-shot  Needle Type: Echogenic Needle     Needle Length: 9cm  Needle Gauge: 21     Additional Needles:   Narrative:  Start time: 01/06/2024 2:04 PM End time: 01/06/2024 2:10 PM Injection made incrementally with aspirations every 5 mL.  Performed by: Personally  Anesthesiologist: Ellena Gurney, MD  Additional Notes: Pt tolerated the procedure well.

## 2024-01-06 NOTE — Anesthesia Procedure Notes (Signed)
 Procedure Name: LMA Insertion Date/Time: 01/06/2024 2:40 PM  Performed by: Elaina Graver, CRNAPre-anesthesia Checklist: Patient identified, Emergency Drugs available, Suction available and Patient being monitored Patient Re-evaluated:Patient Re-evaluated prior to induction Oxygen Delivery Method: Circle System Utilized Preoxygenation: Pre-oxygenation with 100% oxygen Induction Type: IV induction Ventilation: Mask ventilation without difficulty LMA: LMA inserted LMA Size: 4.0 Number of attempts: 1 Airway Equipment and Method: Bite block Placement Confirmation: positive ETCO2 Tube secured with: Tape Dental Injury: Teeth and Oropharynx as per pre-operative assessment

## 2024-01-06 NOTE — Interval H&P Note (Signed)
 History and Physical Interval Note:  01/06/2024 1:37 PM  Joyce [name]  has presented today for surgery, with the diagnosis of Failed left total knee arthroplasty.  The various methods of treatment have been discussed with the patient and family. After consideration of risks, benefits and other options for treatment, the patient has consented to  Procedure(s): TOTAL KNEE REVISION (Left) as a surgical intervention.  The patient's history has been reviewed, patient examined, no change in status, stable for surgery.  I have reviewed the patient's chart and labs.  Questions were answered to the patient's satisfaction.     Bevin Bucks

## 2024-01-06 NOTE — Brief Op Note (Signed)
 01/06/2024  4:29 PM  PATIENT:  Joyce [name]  68 y.o. 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  PROCEDURE:  Procedure(s): TOTAL KNEE REVISION (Left)  SURGEON:  Surgeons and Role:    Claiborne Crew, MD - Primary  PHYSICIAN ASSISTANT: Kim Pen, PA-C  ANESTHESIA:   regional and general  EBL:  300 mL   BLOOD ADMINISTERED:none  DRAINS: none   LOCAL MEDICATIONS USED:  MARCAINE , plus 1gm of Vancomycin powder  SPECIMEN:  No Specimen  DISPOSITION OF SPECIMEN:  N/A  COUNTS:  YES  TOURNIQUET:   Total Tourniquet Time Documented: Thigh (Left) - 60 minutes Total: Thigh (Left) - 60 minutes   DICTATION: .Other Dictation: Dictation Number [id number]  PLAN OF CARE: Admit to inpatient   PATIENT DISPOSITION:  PACU - hemodynamically stable.   Delay start of Pharmacological VTE agent (>24hrs) due to surgical blood loss or risk of bleeding: no
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
## 2024-01-07 LAB — CBC
HCT: 33.3 % — ABNORMAL LOW (ref 36.0–46.0)
Hemoglobin: 10.2 g/dL — ABNORMAL LOW (ref 12.0–15.0)
MCH: 26.4 pg (ref 26.0–34.0)
MCHC: 30.6 g/dL (ref 30.0–36.0)
MCV: 86.3 fL (ref 80.0–100.0)
Platelets: 355 10*3/uL (ref 150–400)
RBC: 3.86 MIL/uL — ABNORMAL LOW (ref 3.87–5.11)
RDW: 14.6 % (ref 11.5–15.5)
WBC: 12.2 10*3/uL — ABNORMAL HIGH (ref 4.0–10.5)
nRBC: 0 % (ref 0.0–0.2)

## 2024-01-07 LAB — BASIC METABOLIC PANEL WITH GFR
Anion gap: 12 (ref 5–15)
BUN: 35 mg/dL — ABNORMAL HIGH (ref 8–23)
CO2: 20 mmol/L — ABNORMAL LOW (ref 22–32)
Calcium: 8.4 mg/dL — ABNORMAL LOW (ref 8.9–10.3)
Chloride: 103 mmol/L (ref 98–111)
Creatinine, Ser: 2.04 mg/dL — ABNORMAL HIGH (ref 0.44–1.00)
GFR, Estimated: 26 mL/min — ABNORMAL LOW (ref >60)
Glucose, Bld: 168 mg/dL — ABNORMAL HIGH (ref 70–99)
Potassium: 4.5 mmol/L (ref 3.5–5.1)
Sodium: 135 mmol/L (ref 135–145)

## 2024-01-07 MED ORDER — HYDRALAZINE HCL 20 MG/ML IJ SOLN: 10.0000 mg | Freq: Four times a day (QID) | INTRAMUSCULAR | Status: DC | PRN
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
## 2024-01-07 NOTE — TOC Transition Note (Signed)
 Transition of Care [facility]) - Discharge Note   Patient Details  Name: Joyce [name] MRN: [id number] Date of Birth: 07-08-1956  [facility]) CM/SW Contact:  Delilah Fend, LCSW Phone Number: 01/07/2024, 9:59 AM   Clinical Narrative:     Met with pt and spouse and they confirm pt has needed DME in the home.  OPPT already arranged with [facility] ([place]).  No further TOC needs.  Final next level of care: OP Rehab Barriers to Discharge: No Barriers Identified   Patient Goals and CMS Choice Patient states their goals for this hospitalization and ongoing recovery are:: return home          Discharge Placement                       Discharge Plan and Services Additional resources added to the After Visit Summary for                  DME Arranged: N/A DME Agency: NA                  Social Drivers of Health (SDOH) Interventions SDOH Screenings   Food Insecurity: No Food Insecurity (01/06/2024)  Recent Concern: Food Insecurity - Food Insecurity Present (12/09/2023)  Housing: Low Risk  (01/06/2024)  Transportation Needs: No Transportation Needs (01/06/2024)  Recent Concern: Transportation Needs - Unmet Transportation Needs (12/09/2023)  Utilities: Not At Risk (01/06/2024)  Financial Resource Strain: Medium Risk (12/09/2023)  Physical Activity: Unknown (12/09/2023)  Social Connections: Socially Integrated (01/06/2024)  Stress: Stress Concern Present (12/09/2023)  Tobacco Use: Medium Risk (01/06/2024)     Readmission Risk Interventions    01/07/2024    9:58 AM  Readmission Risk Prevention Plan  Transportation Screening Complete  PCP or Specialist Appt within 5-7 Days Complete  Home Care Screening Complete  Medication Review (RN CM) Complete

## 2024-01-07 NOTE — Progress Notes (Signed)
   01/07/24 1500  Spiritual Encounters  Type of Visit Initial  Care provided to: Pt and family  Reason for visit Advance directives  OnCall Visit No  Interventions  Spiritual Care Interventions Made Established relationship of care and support;Decision-making support/facilitation  Advance Directives (For Healthcare)  Does Patient Have a Medical Advance Directive? No    Chaplain responded to spiritual care consult. Provided AD paperwork/education and answered all questions. Chaplains remain available.

## 2024-01-07 NOTE — Plan of Care (Signed)
   Problem: Education: Goal: Knowledge of General Education information will improve Description Including pain rating scale, medication(s)/side effects and non-pharmacologic comfort measures Outcome: Progressing   Problem: Health Behavior/Discharge Planning: Goal: Ability to manage health-related needs will improve Outcome: Progressing

## 2024-01-07 NOTE — Progress Notes (Signed)
 Reached out to on-call d/t possible need for BP rx due to patient ongoing blood pressure elevation and pain control issue. Patient is asymptomatic related to elevated blood pressure.On-Call, PA Catering manager) advised she will hold off on prescribing anything new right now and wait to see if the recent pain medication given with the upcoming blood pressure medication will help the patient

## 2024-01-08 LAB — CBC
HCT: 28.4 % — ABNORMAL LOW (ref 36.0–46.0)
Hemoglobin: 8.8 g/dL — ABNORMAL LOW (ref 12.0–15.0)
MCH: 26.3 pg (ref 26.0–34.0)
MCHC: 31 g/dL (ref 30.0–36.0)
MCV: 85 fL (ref 80.0–100.0)
Platelets: 303 10*3/uL (ref 150–400)
RBC: 3.34 MIL/uL — ABNORMAL LOW (ref 3.87–5.11)
RDW: 14.7 % (ref 11.5–15.5)
WBC: 10.2 10*3/uL (ref 4.0–10.5)
nRBC: 0 % (ref 0.0–0.2)

## 2024-01-08 MED ORDER — CYCLOBENZAPRINE HCL 10 MG PO TABS: 10.0000 mg | ORAL_TABLET | Freq: Three times a day (TID) | ORAL | 2 refills | Status: DC | PRN

## 2024-01-08 MED ORDER — POLYETHYLENE GLYCOL 3350 17 G PO PACK: 17.0000 g | PACK | Freq: Two times a day (BID) | ORAL | 0 refills | Status: DC

## 2024-01-08 MED ORDER — OXYCODONE HCL 5 MG PO TABS: 5.0000 mg | ORAL_TABLET | ORAL | 0 refills | Status: DC | PRN

## 2024-01-08 MED ORDER — SENNA 8.6 MG PO TABS: 2.0000 | ORAL_TABLET | Freq: Every day | ORAL | 0 refills | Status: AC

## 2024-01-08 MED ORDER — RIVAROXABAN 10 MG PO TABS: 10.0000 mg | ORAL_TABLET | Freq: Every day | ORAL | 0 refills | Status: DC

## 2024-01-08 NOTE — Anesthesia Postprocedure Evaluation (Signed)
 Anesthesia Post Note  Patient: Joyce [name]  Procedure(s) Performed: TOTAL KNEE REVISION (Left: Knee)     Patient location during evaluation: PACU Anesthesia Type: General and Regional Level of consciousness: awake and alert Pain management: pain level controlled Vital Signs Assessment: post-procedure vital signs reviewed and stable Respiratory status: spontaneous breathing, nonlabored ventilation, respiratory function stable and patient connected to nasal cannula oxygen Cardiovascular status: blood pressure returned to baseline and stable Postop Assessment: no apparent nausea or vomiting Anesthetic complications: no   No notable events documented.  Last Vitals:  Vitals:   01/07/24 2127 01/08/24 0543  BP: (!) 158/73 (!) 154/63  Pulse: 74 77  Resp: 18 18  Temp: 36.8 C 36.7 C  SpO2: 95% 97%    Last Pain:  Vitals:   01/08/24 0724  TempSrc:   PainSc: 5                  Cadell Gabrielson S
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
## 2024-01-09 ENCOUNTER — Encounter (HOSPITAL_COMMUNITY): Payer: Self-pay | Admitting: Orthopedic Surgery
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
## 2024-01-29 ENCOUNTER — Other Ambulatory Visit (HOSPITAL_COMMUNITY): Payer: Self-pay

## 2024-01-29 DIAGNOSIS — Z1231 Encounter for screening mammogram for malignant neoplasm of breast: Secondary | ICD-10-CM

## 2024-02-04 ENCOUNTER — Other Ambulatory Visit (HOSPITAL_COMMUNITY): Payer: Self-pay

## 2024-02-04 ENCOUNTER — Inpatient Hospital Stay (HOSPITAL_COMMUNITY): Admission: RE | Admit: 2024-02-04 | Source: Ambulatory Visit

## 2024-02-04 DIAGNOSIS — Z1231 Encounter for screening mammogram for malignant neoplasm of breast: Secondary | ICD-10-CM

## 2024-02-16 ENCOUNTER — Ambulatory Visit (HOSPITAL_COMMUNITY)
Admission: RE | Admit: 2024-02-16 | Discharge: 2024-02-16 | Disposition: A | Discharge status: Home / Self Care | Source: Ambulatory Visit

## 2024-02-16 DIAGNOSIS — Z1231 Encounter for screening mammogram for malignant neoplasm of breast: Secondary | ICD-10-CM | POA: Diagnosis present

## 2024-02-29 ENCOUNTER — Ambulatory Visit: Payer: Self-pay

## 2024-03-22 ENCOUNTER — Ambulatory Visit

## 2024-03-22 VITALS — BP 160/82 | HR 63 | Ht 67.0 in | Wt 258.0 lb

## 2024-03-22 DIAGNOSIS — N179 Acute kidney failure, unspecified: Secondary | ICD-10-CM | POA: Diagnosis not present

## 2024-03-22 DIAGNOSIS — N1832 Chronic kidney disease, stage 3b: Secondary | ICD-10-CM

## 2024-03-22 DIAGNOSIS — I10 Essential (primary) hypertension: Secondary | ICD-10-CM

## 2024-03-22 DIAGNOSIS — R509 Fever, unspecified: Secondary | ICD-10-CM | POA: Diagnosis not present

## 2024-03-22 DIAGNOSIS — G35 Multiple sclerosis: Secondary | ICD-10-CM | POA: Insufficient documentation

## 2024-03-22 DIAGNOSIS — R7303 Prediabetes: Secondary | ICD-10-CM

## 2024-03-22 DIAGNOSIS — F322 Major depressive disorder, single episode, severe without psychotic features: Secondary | ICD-10-CM | POA: Insufficient documentation

## 2024-03-22 DIAGNOSIS — L308 Other specified dermatitis: Secondary | ICD-10-CM

## 2024-03-22 MED ORDER — OXYCODONE HCL 5 MG PO TABS: 5.0000 mg | ORAL_TABLET | ORAL | Status: DC | PRN

## 2024-03-22 NOTE — Patient Instructions (Signed)
 Recommend adding Claritin  or Zyrtec daily for itch relief.   Also recommend AmLactin lotion for dry skin.

## 2024-03-22 NOTE — Progress Notes (Unsigned)
 Established Patient Office Visit  Subjective   Patient ID: Joyce [name], female    DOB: [birth year]  Age: 68 y.o. MRN: [id number]  Chief Complaint  Patient presents with   Medical Management of Chronic Issues    Pt states infestations scalp eye brows and skin it itches and burning, doesn't feel good at all, trying to lose weight and not being success full, has has a low grade fever for more than 5 days, swelling in hands and arms     HPI Discussed the use of AI scribe software for clinical note transcription with the patient, who gave verbal consent to proceed.  History of Present Illness   Joyce [name] is a 68 year old female who presents with persistent symptoms of an unidentified infestation and generalized weakness.  Parasitic infestation symptoms - Persistent sensations of movement on scalp, hands, ears, nose, and eyelashes - Suspects scabies or dust mites as possible etiology - Small black, pink, or white entities observed on bed covers and clothes - Symptoms have progressed to entities attempting to enter her eyes - Limited ability to wash belongings daily due to well water  restrictions - Recent exposure to great-grandchildren living in the home, raising concern for possible source of infestation - Residence in mother-in-law's basement may contribute to symptoms  Constitutional symptoms - Generalized malaise for over one week - Fever and body aches - Significant weakness, especially in arms and legs - Significant fatigue and difficulty climbing stairs - Poor appetite, primarily consuming noodle soup and candy - Mostly bedridden due to limited strength  Musculoskeletal symptoms - Swelling of hands, especially around the middle fingers - Stiffness in knee following recent knee replacement - Physical therapy sessions extended; upcoming appointments scheduled - Attempting to maintain home therapy regimen despite feeling unwell  Respiratory symptoms - Non-productive  cough - No nausea  Medication use and pain management - Oxycodone  use for post-operative pain, currently at reduced dose of 10 mg as part of weaning process  Psychosocial stressors - Husband recently diagnosed with stomach cancer and gastric ulcer, contributing to increased stress       ROS    Objective:     BP (!) 160/82 (BP Location: Left Arm, Patient Position: Sitting, Cuff Size: Normal)   Pulse 63   Ht 5' 7 (1.702 m)   Wt 258 lb (117 kg)   LMP 12/04/1994   SpO2 96%   BMI 40.41 kg/m  BP Readings from Last 3 Encounters:  03/22/24 (!) 160/82  01/08/24 (!) 154/63  12/31/23 (!) 187/76   Wt Readings from Last 3 Encounters:  03/22/24 258 lb (117 kg)  01/06/24 255 lb (115.7 kg)  12/31/23 255 lb (115.7 kg)      Physical Exam Vitals and nursing note reviewed.  Constitutional:      Appearance: Normal appearance. She is obese.  HENT:     Head: Normocephalic.  Eyes:     Extraocular Movements: Extraocular movements intact.     Pupils: Pupils are equal, round, and reactive to light.  Cardiovascular:     Rate and Rhythm: Normal rate and regular rhythm.  Pulmonary:     Effort: Pulmonary effort is normal.     Breath sounds: Normal breath sounds.  Musculoskeletal:     Cervical back: Normal range of motion and neck supple.  Skin:    Findings: No bruising.  Neurological:     Mental Status: She is alert and oriented to person, place, and time.     Gait:  Gait abnormal (antalgic gait, ambulates with walker).  Psychiatric:        Mood and Affect: Mood normal.        Thought Content: Thought content normal.     No results found for any visits on 03/22/24.  Last CBC Lab Results  Component Value Date   WBC 10.2 01/08/2024   HGB 8.8 (L) 01/08/2024   HCT 28.4 (L) 01/08/2024   MCV 85.0 01/08/2024   MCH 26.3 01/08/2024   RDW 14.7 01/08/2024   PLT 303 01/08/2024   Last metabolic panel Lab Results  Component Value Date   GLUCOSE 168 (H) 01/07/2024   NA 135  01/07/2024   K 4.5 01/07/2024   CL 103 01/07/2024   CO2 20 (L) 01/07/2024   BUN 35 (H) 01/07/2024   CREATININE 2.04 (H) 01/07/2024   GFRNONAA 26 (L) 01/07/2024   CALCIUM  8.4 (L) 01/07/2024   PHOS 4.7 (H) 12/09/2021   PROT 6.0 (L) 12/09/2021   ALBUMIN  3.2 (L) 12/09/2021   LABGLOB 2.3 02/09/2019   AGRATIO 1.4 02/09/2019   BILITOT 0.7 12/09/2021   ALKPHOS 72 12/09/2021   AST 19 12/09/2021   ALT 10 12/09/2021   ANIONGAP 12 01/07/2024   Last lipids Lab Results  Component Value Date   CHOL 221 (H) 12/09/2021   HDL 50 12/09/2021   LDLCALC 154 (H) 12/09/2021   TRIG 86 12/09/2021   CHOLHDL 4.4 12/09/2021   Last hemoglobin A1c Lab Results  Component Value Date   HGBA1C 5.6 12/08/2021   Last thyroid functions Lab Results  Component Value Date   TSH 4.001 02/09/2019   Last vitamin D  No results found for: 25OHVITD2, 25OHVITD3, VD25OH    The ASCVD Risk score (Arnett DK, et al., 2019) failed to calculate for the following reasons:   Risk score cannot be calculated because patient has a medical history suggesting prior/existing ASCVD    Assessment & Plan:   Problem List Items Addressed This Visit       Cardiovascular and Mediastinum   Essential hypertension, benign - Primary     Endocrine   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 No visible bite marks or signs of infestation. Differential diagnosis includes scabies, dust mites, or environmental  factors. Symptoms may be exacerbated by living conditions in the basement. No evidence of scabies as husband does not exhibit symptoms. Possible medication-induced sensations considered but symptoms predate medication use. - Recommend Claritin  or Zyrtec daily for itching. - Consider environmental factors and potential allergens. - Use amlactin or Eucerin lotion for dry skin.  Hypertension Hypertension with recent readings of 180/110 and 160/82. High sodium intake from noodle soup may contribute. No diabetes history, but family history noted. Recent illness may also elevate readings. - Advise reducing sodium intake, particularly from noodle soup. - Order CBC, kidney function tests, and A1c.  Generalized weakness and fatigue Generalized weakness and fatigue possibly related to recent illness and poor nutrition. Limited appetite,  primarily consuming noodle soup and candy. Recent stress due to husband's cancer diagnosis may contribute. - Order blood work to assess health status. - Encourage balanced diet and adequate hydration.  Edema of hands Edema of hands, particularly around fingers, possibly related to high sodium intake and hypertension. No signs of infection or systemic causes identified. - Monitor sodium intake and blood pressure. - Encourage use of lotions for skin dryness.  Status post right knee replacement with chronic pain Status post right knee replacement with ongoing stiffness and pain. Undergoing physical therapy with extended sessions. Pain managed with oxycodone , gradually being weaned off. - Continue physical therapy as scheduled. - Use oxycodone  as needed for severe pain, especially before exercises or therapy sessions.        No follow-ups on file.    Leita Longs, FNP

## 2024-03-23 LAB — IRON,TIBC AND FERRITIN PANEL
Ferritin: 75 ng/mL (ref 15–150)
Iron Saturation: 10 % — ABNORMAL LOW (ref 15–55)
Iron: 44 ug/dL (ref 27–139)
Total Iron Binding Capacity: 438 ug/dL (ref 250–450)
UIBC: 394 ug/dL — ABNORMAL HIGH (ref 118–369)

## 2024-03-23 LAB — B12 AND FOLATE PANEL
Folate: 10.2 ng/mL (ref >3.0)
Vitamin B-12: 567 pg/mL (ref 232–1245)

## 2024-03-23 NOTE — Assessment & Plan Note (Signed)
 Chronic pruritus and skin irritation with sensations of bugs crawling, primarily on the scalp, hands, ears, nose, and eyes. No visible bite marks or signs of infestation. Differential diagnosis includes scabies, dust mites, or environmental factors. Symptoms may be exacerbated by living conditions in the basement. No evidence of scabies as husband does not exhibit symptoms. Possible medication-induced sensations considered but symptoms predate medication use. - Recommend Claritin  or Zyrtec daily for itching. - Consider environmental factors and potential allergens. - Use amlactin or Eucerin lotion for dry skin.

## 2024-03-23 NOTE — Assessment & Plan Note (Signed)
 Recommend working on dietary changes and increasing physical activity to achieve weight loss.

## 2024-03-23 NOTE — Assessment & Plan Note (Signed)
 Recheck cmp and urine for kidney function tests.

## 2024-03-23 NOTE — Assessment & Plan Note (Signed)
 Hypertension with recent readings of 180/110 and 160/82. High sodium intake from noodle soup may contribute. No diabetes history, but family history noted. Recent illness may also elevate readings. - Advise reducing sodium intake, particularly from noodle soup. - Order CBC, kidney function tests, and A1c.

## 2024-03-23 NOTE — Assessment & Plan Note (Signed)
Recheck A1C level  ?

## 2024-03-24 LAB — CMP14+EGFR
ALT: 7 IU/L (ref 0–32)
AST: 16 IU/L (ref 0–40)
Albumin: 4.1 g/dL (ref 3.9–4.9)
Alkaline Phosphatase: 168 IU/L — ABNORMAL HIGH (ref 44–121)
BUN/Creatinine Ratio: 10 — ABNORMAL LOW (ref 12–28)
BUN: 20 mg/dL (ref 8–27)
Bilirubin Total: 0.2 mg/dL (ref 0.0–1.2)
CO2: 23 mmol/L (ref 20–29)
Calcium: 9.5 mg/dL (ref 8.7–10.3)
Chloride: 101 mmol/L (ref 96–106)
Creatinine, Ser: 2.06 mg/dL — ABNORMAL HIGH (ref 0.57–1.00)
Globulin, Total: 3.2 g/dL (ref 1.5–4.5)
Glucose: 102 mg/dL — ABNORMAL HIGH (ref 70–99)
Potassium: 4.6 mmol/L (ref 3.5–5.2)
Sodium: 141 mmol/L (ref 134–144)
Total Protein: 7.3 g/dL (ref 6.0–8.5)
eGFR: 26 mL/min/1.73 — ABNORMAL LOW (ref >59)

## 2024-03-24 LAB — LIPID PANEL
Chol/HDL Ratio: 5.9 ratio — ABNORMAL HIGH (ref 0.0–4.4)
Cholesterol, Total: 278 mg/dL — ABNORMAL HIGH (ref 100–199)
HDL: 47 mg/dL (ref >39)
LDL Chol Calc (NIH): 207 mg/dL — ABNORMAL HIGH (ref 0–99)
Triglycerides: 130 mg/dL (ref 0–149)
VLDL Cholesterol Cal: 24 mg/dL (ref 5–40)

## 2024-03-24 LAB — CBC WITH DIFFERENTIAL/PLATELET
Basophils Absolute: 0 x10E3/uL (ref 0.0–0.2)
Basos: 1 %
EOS (ABSOLUTE): 0.4 x10E3/uL (ref 0.0–0.4)
Eos: 6 %
Hematocrit: 36.4 % (ref 34.0–46.6)
Hemoglobin: 11.3 g/dL (ref 11.1–15.9)
Immature Grans (Abs): 0 x10E3/uL (ref 0.0–0.1)
Immature Granulocytes: 0 %
Lymphocytes Absolute: 2 x10E3/uL (ref 0.7–3.1)
Lymphs: 30 %
MCH: 24.8 pg — ABNORMAL LOW (ref 26.6–33.0)
MCHC: 31 g/dL — ABNORMAL LOW (ref 31.5–35.7)
MCV: 80 fL (ref 79–97)
Monocytes Absolute: 0.6 x10E3/uL (ref 0.1–0.9)
Monocytes: 10 %
Neutrophils Absolute: 3.5 x10E3/uL (ref 1.4–7.0)
Neutrophils: 53 %
Platelets: 407 x10E3/uL (ref 150–450)
RBC: 4.55 x10E6/uL (ref 3.77–5.28)
RDW: 14.4 % (ref 11.7–15.4)
WBC: 6.4 x10E3/uL (ref 3.4–10.8)

## 2024-03-24 LAB — HEMOGLOBIN A1C
Est. average glucose Bld gHb Est-mCnc: 117 mg/dL
Hgb A1c MFr Bld: 5.7 % — ABNORMAL HIGH (ref 4.8–5.6)

## 2024-03-24 LAB — MICROALBUMIN / CREATININE URINE RATIO
Creatinine, Urine: 83.3 mg/dL
Microalb/Creat Ratio: 1119 mg/g{creat} — AB (ref 0–29)
Microalbumin, Urine: 931.8 ug/mL

## 2024-03-30 ENCOUNTER — Emergency Department (HOSPITAL_COMMUNITY): Admission: EM | Admit: 2024-03-30 | Discharge: 2024-03-30 | Disposition: A | Discharge status: Home / Self Care

## 2024-03-30 ENCOUNTER — Other Ambulatory Visit: Payer: Self-pay

## 2024-03-30 ENCOUNTER — Emergency Department (HOSPITAL_COMMUNITY)

## 2024-03-30 ENCOUNTER — Emergency Department (EMERGENCY_DEPARTMENT_HOSPITAL)

## 2024-03-30 ENCOUNTER — Encounter (HOSPITAL_COMMUNITY): Payer: Self-pay | Admitting: *Deleted

## 2024-03-30 DIAGNOSIS — N189 Chronic kidney disease, unspecified: Secondary | ICD-10-CM | POA: Diagnosis not present

## 2024-03-30 DIAGNOSIS — Z8673 Personal history of transient ischemic attack (TIA), and cerebral infarction without residual deficits: Secondary | ICD-10-CM | POA: Insufficient documentation

## 2024-03-30 DIAGNOSIS — I160 Hypertensive urgency: Secondary | ICD-10-CM | POA: Diagnosis not present

## 2024-03-30 DIAGNOSIS — R29898 Other symptoms and signs involving the musculoskeletal system: Secondary | ICD-10-CM | POA: Diagnosis not present

## 2024-03-30 DIAGNOSIS — I129 Hypertensive chronic kidney disease with stage 1 through stage 4 chronic kidney disease, or unspecified chronic kidney disease: Secondary | ICD-10-CM | POA: Diagnosis not present

## 2024-03-30 DIAGNOSIS — R519 Headache, unspecified: Secondary | ICD-10-CM | POA: Diagnosis present

## 2024-03-30 DIAGNOSIS — R531 Weakness: Secondary | ICD-10-CM | POA: Insufficient documentation

## 2024-03-30 DIAGNOSIS — R42 Dizziness and giddiness: Secondary | ICD-10-CM | POA: Diagnosis not present

## 2024-03-30 LAB — BASIC METABOLIC PANEL WITH GFR
Anion gap: 15 (ref 5–15)
BUN: 32 mg/dL — ABNORMAL HIGH (ref 8–23)
CO2: 20 mmol/L — ABNORMAL LOW (ref 22–32)
Calcium: 9.2 mg/dL (ref 8.9–10.3)
Chloride: 106 mmol/L (ref 98–111)
Creatinine, Ser: 1.95 mg/dL — ABNORMAL HIGH (ref 0.44–1.00)
GFR, Estimated: 28 mL/min — ABNORMAL LOW (ref >60)
Glucose, Bld: 114 mg/dL — ABNORMAL HIGH (ref 70–99)
Potassium: 4 mmol/L (ref 3.5–5.1)
Sodium: 141 mmol/L (ref 135–145)

## 2024-03-30 LAB — CBC WITH DIFFERENTIAL/PLATELET
Abs Immature Granulocytes: 0.02 K/uL (ref 0.00–0.07)
Basophils Absolute: 0 K/uL (ref 0.0–0.1)
Basophils Relative: 0 %
Eosinophils Absolute: 0.5 K/uL (ref 0.0–0.5)
Eosinophils Relative: 5 %
HCT: 37.4 % (ref 36.0–46.0)
Hemoglobin: 11.6 g/dL — ABNORMAL LOW (ref 12.0–15.0)
Immature Granulocytes: 0 %
Lymphocytes Relative: 27 %
Lymphs Abs: 2.3 K/uL (ref 0.7–4.0)
MCH: 24.9 pg — ABNORMAL LOW (ref 26.0–34.0)
MCHC: 31 g/dL (ref 30.0–36.0)
MCV: 80.4 fL (ref 80.0–100.0)
Monocytes Absolute: 0.9 K/uL (ref 0.1–1.0)
Monocytes Relative: 11 %
Neutro Abs: 4.7 K/uL (ref 1.7–7.7)
Neutrophils Relative %: 57 %
Platelets: 400 K/uL (ref 150–400)
RBC: 4.65 MIL/uL (ref 3.87–5.11)
RDW: 14.3 % (ref 11.5–15.5)
WBC: 8.4 K/uL (ref 4.0–10.5)
nRBC: 0 % (ref 0.0–0.2)

## 2024-03-30 LAB — TROPONIN I (HIGH SENSITIVITY)
Troponin I (High Sensitivity): 10 ng/L (ref <18)
Troponin I (High Sensitivity): 10 ng/L (ref <18)

## 2024-03-30 MED ORDER — MIDAZOLAM HCL 2 MG/2ML IJ SOLN: 2.0000 mg | Freq: Once | INTRAMUSCULAR | Status: DC | PRN

## 2024-03-30 MED ORDER — HYDRALAZINE HCL 20 MG/ML IJ SOLN
10.0000 mg | Freq: Once | INTRAMUSCULAR | Status: AC
  Administered 2024-03-30: 10 mg via INTRAVENOUS
  Filled 2024-03-30: qty 1

## 2024-03-30 MED ORDER — LABETALOL HCL 5 MG/ML IV SOLN
20.0000 mg | Freq: Once | INTRAVENOUS | Status: DC
  Filled 2024-03-30: qty 4

## 2024-03-30 MED ORDER — METOCLOPRAMIDE HCL 5 MG/ML IJ SOLN
10.0000 mg | Freq: Once | INTRAMUSCULAR | Status: AC
  Administered 2024-03-30: 10 mg via INTRAVENOUS
  Filled 2024-03-30: qty 2

## 2024-03-30 MED ORDER — METHYLPREDNISOLONE 4 MG PO TBPK
ORAL_TABLET | ORAL | 0 refills | Status: DC
Start: 2024-03-30 — End: 2024-05-11

## 2024-03-30 MED ORDER — HYDRALAZINE HCL 10 MG PO TABS: 10.0000 mg | ORAL_TABLET | Freq: Three times a day (TID) | ORAL | 0 refills | Status: DC

## 2024-03-30 MED ORDER — DEXAMETHASONE SODIUM PHOSPHATE 10 MG/ML IJ SOLN
10.0000 mg | Freq: Once | INTRAMUSCULAR | Status: AC
  Administered 2024-03-30: 10 mg via INTRAVENOUS
  Filled 2024-03-30: qty 1

## 2024-03-30 MED ORDER — LACTATED RINGERS IV BOLUS: 500.0000 mL | Freq: Once | INTRAVENOUS | Status: DC
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
## 2024-03-30 NOTE — Discharge Instructions (Addendum)
 You were seen today for hypertensive urgency and right arm weakness.  Your lab findings and imaging as well as physical exam on reevaluation was very reassuring.  I have low suspicion for any emergent cause your symptoms today.  However I am represcribing you with a new medication to take as needed if your blood pressure reaches above 180/120, take this as needed up to 3 times a day.  However will have you continue to follow-up with your PCP for further blood pressure management as I believe this is likely cause of your headaches today.  However if you been having new or worsening symptoms, please do not hesitate to come back to the ED for further evaluations.

## 2024-03-30 NOTE — ED Notes (Signed)
 Noted elevated BP in triage, pt states she took her HTN meds not too long ago.

## 2024-03-30 NOTE — ED Triage Notes (Signed)
 Pt c/o right hand swelling with tingling to fingers for awhile. Denies any injury

## 2024-03-30 NOTE — ED Notes (Signed)
 Patient transported to MRI

## 2024-04-06 DIAGNOSIS — Z96652 Presence of left artificial knee joint: Secondary | ICD-10-CM | POA: Diagnosis not present

## 2024-04-06 DIAGNOSIS — M25562 Pain in left knee: Secondary | ICD-10-CM | POA: Diagnosis not present

## 2024-04-14 ENCOUNTER — Other Ambulatory Visit: Payer: Self-pay

## 2024-04-15 ENCOUNTER — Inpatient Hospital Stay: Payer: Self-pay

## 2024-04-20 ENCOUNTER — Inpatient Hospital Stay

## 2024-04-23 ENCOUNTER — Telehealth: Payer: Self-pay

## 2024-04-23 NOTE — Telephone Encounter (Signed)
 Copied from CRM #[id number]. Topic: General - Other >> Apr 23, 2024  4:33 PM Turkey B wrote: Reason for CRM: maureen from [facility] called, states , wanted to inform, that  meds pt may be prescribed may cause drowsiness and dizziness, risk of falling for cyclobenzaprine  /oxycodone  and tramadol . Caller states, may have to  decrease dosage or change medication all together

## 2024-04-26 NOTE — Telephone Encounter (Signed)
 Changes noted and will be implemented

## 2024-05-02 ENCOUNTER — Ambulatory Visit: Payer: Self-pay

## 2024-05-11 ENCOUNTER — Other Ambulatory Visit: Payer: Self-pay

## 2024-05-11 DIAGNOSIS — N1832 Chronic kidney disease, stage 3b: Secondary | ICD-10-CM

## 2024-05-11 DIAGNOSIS — E782 Mixed hyperlipidemia: Secondary | ICD-10-CM

## 2024-05-11 MED ORDER — ATORVASTATIN CALCIUM 40 MG PO TABS: 40.0000 mg | ORAL_TABLET | Freq: Every day | ORAL | 1 refills | Status: AC

## 2024-05-17 ENCOUNTER — Ambulatory Visit: Payer: Self-pay

## 2024-05-17 NOTE — Telephone Encounter (Signed)
 FYI Only or Action Required?: FYI only for provider.  Patient was last seen in primary care on 03/22/2024 by Joyce Doffing, FNP.  Called Nurse Triage reporting [place].  Symptoms began several years ago.  Interventions attempted: Rest, hydration, or home remedies.  Symptoms are: gradually worsening.  Triage Disposition: See PCP When Office is Open (Within 3 Days)  Patient/caregiver understands and will follow disposition?: Yes  Copied from CRM [id number]. Topic: Clinical - Red Word Triage >> May 17, 2024  4:28 PM Joyce [name] wrote: Red Word that prompted transfer to Nurse Triage: Parasitic bumps in her nose, eyes, ears, and forehead, and feet swelling. Depression because of her health. Reason for Disposition  Mild widespread rash  (Exception: Heat rash lasting 3 days or less.)  Answer Assessment - Initial Assessment Questions Pt states over the last couple years has been dealing with parasitic bumps. States they're near her mouth, nose, eyebrows/eyelashes, she can feel things walking around her scalp and face. Some are red, some are puffy and normal colored. States she feels things pushing their way out of the bumps and then fall. Generally white or pink. States nothing was seen or found at previous appointment.   1. APPEARANCE of RASH: What does the rash look like? (e.g., blisters, dry flaky skin, red spots, redness, sores)     Dry flaky, white pink 2. SIZE: How big are the spots? (e.g., tip of pen, eraser, coin; inches, centimeters)     Tip of pen 3. LOCATION: Where is the rash located?     Generalized to back forehead face and ears  4. COLOR: What color is the rash? (Note: It is difficult to assess rash color in people with darker-colored skin. When this situation occurs, simply ask the caller to describe what they see.)     unsure 5. ONSET: When did the rash begin?     Two years ago; evaluated two months ago 6. FEVER: Do you have a fever? If Yes, ask: What is your  temperature, how was it measured, and when did it start?     States intermittent fevers but is not using thermometer 7. ITCHING: Does the rash itch? If Yes, ask: How bad is the itch? (Scale 1-10; or mild, moderate, severe)     Sometimes; 2-3/10 8. CAUSE: What do you think is causing the rash?     Pt states parasites 9. MEDICINE FACTORS: Have you started any new medicines within the last 2 weeks? (e.g., antibiotics)      Denies  10. OTHER SYMPTOMS: Do you have any other symptoms? (e.g., dizziness, headache, sore throat, joint pain)       denies  Protocols used: Rash or Redness - [facility]

## 2024-05-18 NOTE — Telephone Encounter (Signed)
 Appt made.

## 2024-05-19 ENCOUNTER — Ambulatory Visit: Payer: Self-pay | Admitting: Family Medicine

## 2024-05-20 ENCOUNTER — Ambulatory Visit

## 2024-06-08 ENCOUNTER — Encounter (INDEPENDENT_AMBULATORY_CARE_PROVIDER_SITE_OTHER): Payer: Self-pay | Admitting: *Deleted

## 2024-06-16 ENCOUNTER — Ambulatory Visit: Payer: Self-pay

## 2024-06-16 NOTE — Telephone Encounter (Signed)
  FYI Only or Action Required?: FYI only for provider: appointment scheduled on 06/17/2024.  Patient was last seen in primary care on 03/22/2024 by Bevely Doffing, FNP.  Called Nurse Triage reporting Hand Pain.  Symptoms began ongoing and worsening x 2 weeks.  Interventions attempted: OTC medications: pain medication / OTC.  Symptoms are: gradually worsening.  Triage Disposition: See PCP When Office is Open (Within 3 Days)  Patient/caregiver understands and will follow disposition?: Yes   Copied from CRM #[id number]. Topic: Clinical - Red Word Triage >> Jun 16, 2024 12:12 PM Thliyah D wrote: Poor circulation and discomfort in her right hand. She taken medicine and nothing is helping it's been going on awhile. Says it's going up her arm and it's very uncomfortable. Reason for Disposition  [1] MODERATE pain (e.g., interferes with normal activities) AND [2] present > 3 days  Answer Assessment - Initial Assessment Questions 1. ONSET: When did the pain start?     X 2 weeks and worsening 2. LOCATION: Where is the pain located?     Right hand/wrist into arm 3. PAIN: How bad is the pain? (Scale 1-10; or mild, moderate, severe)     7/10 4. WORK OR EXERCISE: Has there been any recent work or exercise that involved this part (i.e., hand or wrist) of the body?     na 5. CAUSE: What do you think is causing the pain?     Unknown 6. AGGRAVATING FACTORS: What makes the pain worse? (e.g., using computer)     Using hand makes it worse 7. OTHER SYMPTOMS: Do you have any other symptoms? (e.g., fever, neck pain, numbness or tingling, rash, swelling)     Numbness, tingling, weakness, no grip, not able to grab 8. PREGNANCY: Is there any chance you are pregnant? When was your last menstrual period?     na  Protocols used: Hand Pain-A-AH

## 2024-06-16 NOTE — Telephone Encounter (Signed)
 Appt made.

## 2024-06-17 ENCOUNTER — Ambulatory Visit: Payer: Self-pay | Admitting: Family Medicine

## 2024-06-28 ENCOUNTER — Ambulatory Visit

## 2024-07-07 ENCOUNTER — Other Ambulatory Visit: Payer: Self-pay

## 2024-07-12 ENCOUNTER — Ambulatory Visit: Admitting: Family Medicine

## 2024-08-04 ENCOUNTER — Other Ambulatory Visit: Payer: Self-pay

## 2024-08-13 ENCOUNTER — Ambulatory Visit

## 2024-08-13 VITALS — BP 118/60 | HR 73 | Resp 16 | Ht 67.0 in | Wt 259.1 lb

## 2024-08-13 DIAGNOSIS — Z0001 Encounter for general adult medical examination with abnormal findings: Secondary | ICD-10-CM | POA: Diagnosis not present

## 2024-08-13 DIAGNOSIS — Z23 Encounter for immunization: Secondary | ICD-10-CM

## 2024-08-13 DIAGNOSIS — Z1211 Encounter for screening for malignant neoplasm of colon: Secondary | ICD-10-CM

## 2024-08-13 DIAGNOSIS — E782 Mixed hyperlipidemia: Secondary | ICD-10-CM

## 2024-08-13 DIAGNOSIS — Z780 Asymptomatic menopausal state: Secondary | ICD-10-CM | POA: Diagnosis not present

## 2024-08-13 DIAGNOSIS — Z0000 Encounter for general adult medical examination without abnormal findings: Secondary | ICD-10-CM
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
## 2024-08-13 NOTE — Patient Instructions (Signed)
 Joyce [name],  Thank you for taking the time for your Medicare Wellness Visit. I appreciate your continued commitment to your health goals. Please review the care plan we discussed, and feel free to reach out if I can assist you further.  Please note that Annual Wellness Visits do not include a physical exam. Some assessments may be limited, especially if the visit was conducted virtually. If needed, we may recommend an in-person follow-up with your provider.  Ongoing Care Seeing your primary care provider every 3 to 6 months helps us  monitor your health and provide consistent, personalized care.   1 year follow up for Medicare well visit: August 16, 2025 at 8:00 am with medicare wellness nurse in office  Referrals If a referral was made during today's visit and you haven't received any updates within two weeks, please contact the referred provider directly to check on the status.  Osteoporosis Screening An order was placed for you to have your Osteoporosis Screening. Call the number below to schedule that [facility]  [phone]   [facility] at  [address] Phone: [phone]   Recommended Screenings:  Health Maintenance  Topic Date Due   Osteoporosis screening with Bone Density Scan  Never done   Medicare Annual Wellness Visit  Never done   COVID-19 Vaccine (1) Never done   Complete foot exam   Never done   Eye exam for diabetics  Never done   Hepatitis C Screening  Never done   Zoster (Shingles) Vaccine (1 of 2) Never done   DTaP/Tdap/Td vaccine (2 - Td or Tdap) 06/20/2021   Colon Cancer Screening  07/05/2024   Hemoglobin A1C  09/22/2024   Breast Cancer Screening  02/15/2025   Yearly kidney health urinalysis for diabetes  03/22/2025   Yearly kidney function blood test for diabetes  03/30/2025   Pneumococcal Vaccine for age over 49  Completed   Flu Shot  Completed   Meningitis B Vaccine  Aged Out       08/13/2024    11:46 AM  Advanced Directives  Does Patient Have a Medical Advance Directive? No  Would patient like information on creating a medical advance directive? Yes (MAU/Ambulatory/Procedural Areas - Information given)    Vision: Annual vision screenings are recommended for early detection of glaucoma, cataracts, and diabetic retinopathy. These exams can also reveal signs of chronic conditions such as diabetes and high blood pressure.  Dental: Annual dental screenings help detect early signs of oral cancer, gum disease, and other conditions linked to overall health, including heart disease and diabetes.  Please see the attached documents for additional preventive care recommendations.

## 2024-08-16 ENCOUNTER — Encounter (INDEPENDENT_AMBULATORY_CARE_PROVIDER_SITE_OTHER): Payer: Self-pay | Admitting: *Deleted

## 2024-08-18 ENCOUNTER — Ambulatory Visit: Payer: Self-pay | Admitting: Internal Medicine

## 2024-08-23 ENCOUNTER — Other Ambulatory Visit (HOSPITAL_COMMUNITY)

## 2024-09-23 ENCOUNTER — Ambulatory Visit: Payer: Self-pay | Admitting: Internal Medicine

## 2024-10-20 ENCOUNTER — Ambulatory Visit: Admitting: Internal Medicine

## 2025-08-16 ENCOUNTER — Ambulatory Visit: Payer: Self-pay
